# Patient Record
Sex: Male | Born: 1945 | Race: White | Hispanic: No | Marital: Married | Smoking: Current every day smoker
Health system: Southern US, Community
[De-identification: ages and names within clinical notes are randomized; demographics above are authoritative.]

## PROBLEM LIST (undated history)

## (undated) DIAGNOSIS — N182 Chronic kidney disease, stage 2 (mild): Secondary | ICD-10-CM

## (undated) DIAGNOSIS — C3490 Malignant neoplasm of unspecified part of unspecified bronchus or lung: Secondary | ICD-10-CM

## (undated) DIAGNOSIS — J45909 Unspecified asthma, uncomplicated: Secondary | ICD-10-CM

## (undated) DIAGNOSIS — I714 Abdominal aortic aneurysm, without rupture, unspecified: Secondary | ICD-10-CM

## (undated) DIAGNOSIS — I2510 Atherosclerotic heart disease of native coronary artery without angina pectoris: Secondary | ICD-10-CM

## (undated) DIAGNOSIS — D696 Thrombocytopenia, unspecified: Secondary | ICD-10-CM

## (undated) DIAGNOSIS — I5032 Chronic diastolic (congestive) heart failure: Secondary | ICD-10-CM

## (undated) DIAGNOSIS — D649 Anemia, unspecified: Secondary | ICD-10-CM

## (undated) DIAGNOSIS — I509 Heart failure, unspecified: Secondary | ICD-10-CM

## (undated) DIAGNOSIS — B159 Hepatitis A without hepatic coma: Secondary | ICD-10-CM

## (undated) DIAGNOSIS — I480 Paroxysmal atrial fibrillation: Secondary | ICD-10-CM

## (undated) DIAGNOSIS — M069 Rheumatoid arthritis, unspecified: Secondary | ICD-10-CM

## (undated) DIAGNOSIS — J9610 Chronic respiratory failure, unspecified whether with hypoxia or hypercapnia: Secondary | ICD-10-CM

## (undated) DIAGNOSIS — I2781 Cor pulmonale (chronic): Secondary | ICD-10-CM

## (undated) DIAGNOSIS — G4733 Obstructive sleep apnea (adult) (pediatric): Secondary | ICD-10-CM

## (undated) DIAGNOSIS — I4821 Permanent atrial fibrillation: Secondary | ICD-10-CM

## (undated) DIAGNOSIS — I10 Essential (primary) hypertension: Secondary | ICD-10-CM

## (undated) DIAGNOSIS — J449 Chronic obstructive pulmonary disease, unspecified: Secondary | ICD-10-CM

## (undated) DIAGNOSIS — I272 Pulmonary hypertension, unspecified: Secondary | ICD-10-CM

## (undated) HISTORY — PX: BACK SURGERY: SHX140

## (undated) HISTORY — PX: JOINT REPLACEMENT: SHX530

## (undated) HISTORY — PX: FRACTURE SURGERY: SHX138

---

## 2014-09-29 ENCOUNTER — Emergency Department (HOSPITAL_BASED_OUTPATIENT_CLINIC_OR_DEPARTMENT_OTHER)
Admission: EM | Admit: 2014-09-29 | Discharge: 2014-09-29 | Disposition: A | Discharge status: Home / Self Care | Payer: Medicare Other | Attending: Emergency Medicine | Admitting: Emergency Medicine

## 2014-09-29 ENCOUNTER — Encounter (HOSPITAL_BASED_OUTPATIENT_CLINIC_OR_DEPARTMENT_OTHER): Payer: Self-pay

## 2014-09-29 ENCOUNTER — Emergency Department (HOSPITAL_BASED_OUTPATIENT_CLINIC_OR_DEPARTMENT_OTHER): Payer: Medicare Other

## 2014-09-29 DIAGNOSIS — M179 Osteoarthritis of knee, unspecified: Secondary | ICD-10-CM | POA: Diagnosis not present

## 2014-09-29 DIAGNOSIS — Z8619 Personal history of other infectious and parasitic diseases: Secondary | ICD-10-CM | POA: Insufficient documentation

## 2014-09-29 DIAGNOSIS — I10 Essential (primary) hypertension: Secondary | ICD-10-CM | POA: Insufficient documentation

## 2014-09-29 DIAGNOSIS — Z79899 Other long term (current) drug therapy: Secondary | ICD-10-CM | POA: Insufficient documentation

## 2014-09-29 DIAGNOSIS — R52 Pain, unspecified: Secondary | ICD-10-CM

## 2014-09-29 DIAGNOSIS — M25562 Pain in left knee: Secondary | ICD-10-CM | POA: Diagnosis present

## 2014-09-29 DIAGNOSIS — M25462 Effusion, left knee: Secondary | ICD-10-CM | POA: Diagnosis not present

## 2014-09-29 DIAGNOSIS — M1710 Unilateral primary osteoarthritis, unspecified knee: Secondary | ICD-10-CM

## 2014-09-29 DIAGNOSIS — J449 Chronic obstructive pulmonary disease, unspecified: Secondary | ICD-10-CM | POA: Insufficient documentation

## 2014-09-29 DIAGNOSIS — Z7982 Long term (current) use of aspirin: Secondary | ICD-10-CM | POA: Diagnosis not present

## 2014-09-29 HISTORY — DX: Essential (primary) hypertension: I10

## 2014-09-29 HISTORY — DX: Hepatitis a without hepatic coma: B15.9

## 2014-09-29 HISTORY — DX: Chronic obstructive pulmonary disease, unspecified: J44.9

## 2014-09-29 MED ORDER — HYDROCODONE-ACETAMINOPHEN 5-325 MG PO TABS: 1.0000 | ORAL_TABLET | Freq: Four times a day (QID) | ORAL | Status: DC | PRN

## 2014-09-29 MED ORDER — HYDROCODONE-ACETAMINOPHEN 5-325 MG PO TABS
1.0000 | ORAL_TABLET | Freq: Once | ORAL | Status: AC
  Administered 2014-09-29: 1 via ORAL
  Filled 2014-09-29: qty 1

## 2014-09-29 NOTE — Discharge Instructions (Signed)
Take motrin for pain.  Take vicodin for severe pain. Do NOT drive with it  Use ace wrap for comfort.   Elevated your left leg.   Use crutches for comfort.  See orthopedic doctor.  Return to ER if you have severe pain, unable to walk, fever, worse swelling.

## 2014-09-29 NOTE — ED Provider Notes (Signed)
CSN: [id number]     Arrival date & time 09/29/14  1816 History  This chart was scribed for Wandra Arthurs, MD by Stephania Fragmin, ED Scribe. This patient was seen in room MHT13/MHT13 and the patient's care was started at 7:14 PM.    Chief Complaint  Patient presents with  . Knee Pain   The history is provided by the patient. No language interpreter was used.    HPI Comments: Scott Vanderveer is a 69 y.o. male who presents to the Emergency Department complaining of constant, moderate left knee pain that began when he woke up this morning. He denies any known injury but went bowling yesterday. He also complains of associated left knee swelling. Tried a sleeve on his left knee that helped. Patient takes a daily baby aspirin. He denies fever.   Past Medical History  Diagnosis Date  . Hepatitis A   . COPD (chronic obstructive pulmonary disease)   . Hypertension    Past Surgical History  Procedure Laterality Date  . Back surgery    . Joint replacement    . Fracture surgery     No family history on file. History  Substance Use Topics  . Smoking status: Never Smoker   . Smokeless tobacco: Not on file  . Alcohol Use: No    Review of Systems  Constitutional: Negative for fever.  Musculoskeletal: Positive for joint swelling and arthralgias.  All other systems reviewed and are negative.  Allergies  Darvon  Home Medications   Prior to Admission medications   Medication Sig Start Date End Date Taking? Authorizing Provider  aspirin 81 MG tablet Take 81 mg by mouth daily.   Yes Historical Provider, MD  gabapentin (NEURONTIN) 300 MG capsule Take 300 mg by mouth 3 (three) times daily.   Yes Historical Provider, MD  hydrochlorothiazide (HYDRODIURIL) 25 MG tablet Take 25 mg by mouth daily.   Yes Historical Provider, MD  lisinopril (PRINIVIL,ZESTRIL) 10 MG tablet Take 10 mg by mouth daily.   Yes Historical Provider, MD   BP 149/62 mmHg  Pulse 55  Temp(Src) 98.2 F (36.8 C) (Oral)  Resp 18  Ht 6'  1" (1.854 m)  Wt 235 lb (106.595 kg)  BMI 31.01 kg/m2  SpO2 96% Physical Exam  Constitutional: He is oriented to person, place, and time. He appears well-developed and well-nourished. No distress.  HENT:  Head: Normocephalic and atraumatic.  Eyes: Conjunctivae and EOM are normal.  Neck: Neck supple. No tracheal deviation present.  Cardiovascular: Normal rate.   Pulmonary/Chest: Effort normal. No respiratory distress.  Musculoskeletal: Normal range of motion.  Moderate left knee effusion. Normal ROM of the left knee. 2+ pedal pulses. ACL intact.   Neurological: He is alert and oriented to person, place, and time.  Skin: Skin is warm and dry.  Psychiatric: He has a normal mood and affect. His behavior is normal.  Nursing note and vitals reviewed.   ED Course  Procedures (including critical care time)  DIAGNOSTIC STUDIES: Oxygen Saturation is 96% on RA, normal by my interpretation.    COORDINATION OF CARE: 7:17 PM - XR results reviewed with pt. Discussed treatment plan with pt at bedside which includes Rx Vicodin, wrapping knee in a compression sleeve, and elevating the leg. Pt declines crutches, as he states he has his wife's crutches at home, which he can adjust. Will also refer to orthopedist for injections. Pt verbalized understanding and agreed to plan.   Imaging Review Dg Knee Ap/lat W/sunrise Left  09/29/2014  CLINICAL DATA:  Knee pain. LEFT knee pain for 1 day. Denies injury. Distal patellar pain.  EXAM: LEFT KNEE 3 VIEWS  COMPARISON:  None.  FINDINGS: No acute osseous abnormality. Mild varus deformity of the knee is present associated with medial compartment joint space collapse. Chondrocalcinosis of the lateral meniscus. 24 mm calcification is present dorsal to the tibial plateau, likely representing a loose body in the popliteal sheath. Atherosclerosis. Moderate patellofemoral osteoarthritis.  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    Electronically Signed   By: Dereck Ligas M.D.   On: 09/29/2014 18:54   MDM   Final diagnoses:  Pain   Isaak Delmundo is a 69 y.o. male here with L knee pain with effusion. Was bowling yesterday but no injury. Xray showed arthritis. Likely reactive effusion from overuse. No signs of septic knee. Knee is not red or hot to suggest gout. Likely from arthritis. Ace wrap applied. Has crutches at home. Recommend leg elevation, crutches for comfort, motrin, prn vicodin, ortho f/u.    I personally performed the services described in this documentation, which was scribed in my presence. The recorded information has been reviewed and is accurate.      Wandra Arthurs, MD 09/29/14 [phone]

## 2014-09-29 NOTE — ED Notes (Signed)
Left knee pain since last night. Denies injury.

## 2015-06-03 ENCOUNTER — Ambulatory Visit (INDEPENDENT_AMBULATORY_CARE_PROVIDER_SITE_OTHER): Payer: Medicare Other | Admitting: Podiatry

## 2015-06-03 ENCOUNTER — Encounter: Payer: Self-pay | Admitting: Podiatry

## 2015-06-03 ENCOUNTER — Other Ambulatory Visit: Payer: Self-pay

## 2015-06-03 VITALS — BP 168/76 | HR 53 | Ht 72.0 in | Wt 238.0 lb

## 2015-06-03 DIAGNOSIS — M21969 Unspecified acquired deformity of unspecified lower leg: Secondary | ICD-10-CM

## 2015-06-03 DIAGNOSIS — M216X9 Other acquired deformities of unspecified foot: Secondary | ICD-10-CM

## 2015-06-03 DIAGNOSIS — M216X1 Other acquired deformities of right foot: Secondary | ICD-10-CM

## 2015-06-03 DIAGNOSIS — M722 Plantar fascial fibromatosis: Secondary | ICD-10-CM | POA: Diagnosis not present

## 2015-06-03 DIAGNOSIS — M216X2 Other acquired deformities of left foot: Secondary | ICD-10-CM

## 2015-06-03 NOTE — Patient Instructions (Signed)
Seen for left heel pain. Noted of tight Achilles tendon and weakened first metatarsal bone with heel spur. Injection given. Reviewed tendon stretch exercise. Do daily stretch. Return in 2 weeks.

## 2015-06-03 NOTE — Progress Notes (Signed)
SUBJECTIVE: 70 y.o. year old male presents complaining of left heel pain especially in the morning, feels like a stone bruise. Pain goes up to calf at times. Pain started about 1 year ago and getting worse. Pain level is 10 out of 10 scale.   REVIEW OF SYSTEMS: Constitutional: negative Eyes: negative Ears, nose, mouth, throat, and face: negative Respiratory: negative Cardiovascular: history of a mild heart attack 5-6 years ago. Gastrointestinal: negative Genitourinary:negative Hematologic/lymphatic: negative Musculoskeletal:negative Neurological: negative Endocrine: negative Allergic/Immunologic: negative  OBJECTIVE: DERMATOLOGIC EXAMINATION: Nails: Thick yellow hypertrophic nails x 10.  No abnormal skin lesions noted.  VASCULAR EXAMINATION OF LOWER LIMBS: Pedal pulses: All pedal pulses are palpable with normal pulsation. Capillary Filling times within 3 seconds in all digits.  No edema or erythema, no ischemic changes noted. Temperature gradient from tibial crest to dorsum of foot is within normal bilateral.  NEUROLOGIC EXAMINATION OF THE LOWER LIMBS: Achilles DTR is present and within normal. Monofilament (Semmes-Weinstein 10-gm) sensory testing positive 3 out of 6, bilateral. Vibratory sensations('128Hz'$  turning fork) intact at medial and lateral forefoot bilateral.  Sharp and Dull discriminatory sensations at the plantar ball of hallux is intact bilateral.   MUSCULOSKELETAL EXAMINATION: Positive for high arched cavus type foot bilateral. Limited STJ motion bilateral. Tight Achilles tendon bilateral.   RADIOGRAPHIC STUDIES:  General overview: Negative of Osteopenia Absence of soft tissue swelling. Absence of acute change in osseous or articular surfaces of forefoot or rearfoot.  AP View:  Rectus foot without gross deformities. Lateral view:  High arched cavus foot bilateral. Elevated first metatarsal left. Positive of plantar calcaneal spur left.   ASSESSMENT: 1.  Plantar fasciitis left with heel spur. 2. Ankle equinus bilateral. 3. 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  PLAN: Reviewed clinical findings and available treatment options. Left heel injected with mixture of 4 mg Dexamethasone, 4 mg Triamcinolone, and 1 cc of 0.5% Marcaine plain. Patient tolerated well without difficulty.  Night Splint dispensed with instruction. Stretch exercise reviewed. Return in 2 weeks.

## 2015-06-17 ENCOUNTER — Encounter: Payer: Self-pay | Admitting: Podiatry

## 2015-06-17 ENCOUNTER — Ambulatory Visit (INDEPENDENT_AMBULATORY_CARE_PROVIDER_SITE_OTHER): Payer: Medicare Other | Admitting: Podiatry

## 2015-06-17 ENCOUNTER — Ambulatory Visit: Payer: Medicare Other | Admitting: Podiatry

## 2015-06-17 VITALS — BP 159/71 | HR 50

## 2015-06-17 DIAGNOSIS — B351 Tinea unguium: Secondary | ICD-10-CM

## 2015-06-17 DIAGNOSIS — M21969 Unspecified acquired deformity of unspecified lower leg: Secondary | ICD-10-CM | POA: Diagnosis not present

## 2015-06-17 DIAGNOSIS — M722 Plantar fascial fibromatosis: Secondary | ICD-10-CM | POA: Diagnosis not present

## 2015-06-17 DIAGNOSIS — M79605 Pain in left leg: Secondary | ICD-10-CM | POA: Diagnosis not present

## 2015-06-17 NOTE — Patient Instructions (Signed)
Improved heel pain. All nails debrided. Return in 3 months or sooner if needed.

## 2015-06-17 NOTE — Progress Notes (Signed)
SUBJECTIVE: 70 y.o. year old male presents stating that the pain has subsided, only occasional pain now since he was treated with Cortisone injection and Night Splint.   2 weeks ago he had left heel pain especially in the morning, feels like a stone bruise. Pain goes up to calf at times. Pain started about 1 year ago and getting worse. Pain level is 10 out of 10 scale.   OBJECTIVE: DERMATOLOGIC EXAMINATION: Nails: Thick yellow hypertrophic nails x 10.  No abnormal skin lesions noted.  VASCULAR EXAMINATION OF LOWER LIMBS: Pedal pulses: All pedal pulses are palpable with normal pulsation. Capillary Filling times within 3 seconds in all digits.  No edema or erythema, no ischemic changes noted. Temperature gradient from tibial crest to dorsum of foot is within normal bilateral.  NEUROLOGIC EXAMINATION OF THE LOWER LIMBS: All epicritic and tactile sensations grossly intact.   MUSCULOSKELETAL EXAMINATION: Positive for high arched cavus type foot bilateral. Limited STJ motion bilateral. Tight Achilles tendon bilateral.   ASSESSMENT: 1. Plantar fasciitis left with heel spur improved with injection and night splint. 2. Ankle equinus bilateral. 3. 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 4. Onychomycosis x 10.  PLAN: Reviewed clinical findings and available treatment options. All nails debrided. Continue stretch exercise. Return in 3 months or as needed.

## 2015-09-16 ENCOUNTER — Ambulatory Visit: Payer: Medicare Other | Admitting: Podiatry

## 2015-10-14 ENCOUNTER — Ambulatory Visit (INDEPENDENT_AMBULATORY_CARE_PROVIDER_SITE_OTHER): Payer: Medicare Other | Admitting: Podiatry

## 2015-10-14 ENCOUNTER — Encounter: Payer: Self-pay | Admitting: Podiatry

## 2015-10-14 VITALS — BP 130/105 | HR 60

## 2015-10-14 DIAGNOSIS — B351 Tinea unguium: Secondary | ICD-10-CM

## 2015-10-14 DIAGNOSIS — M79605 Pain in left leg: Secondary | ICD-10-CM

## 2015-10-14 DIAGNOSIS — M79673 Pain in unspecified foot: Secondary | ICD-10-CM | POA: Diagnosis not present

## 2015-10-14 NOTE — Patient Instructions (Signed)
Seen for hypertrophic nails. All nails debrided. Return in 3 months or as needed.  

## 2015-10-14 NOTE — Progress Notes (Signed)
SUBJECTIVE: 69 y.o. year old male presents complaining of painful toe nails.  Positive history of heel pain that was resolved with cortisone injection and Night Splint with satisfactory relief.  OBJECTIVE: DERMATOLOGIC EXAMINATION: Nails: Thick yellow hypertrophic nails x 10.  No abnormal skin lesions noted.  VASCULAR EXAMINATION OF LOWER LIMBS: Pedal pulses: All pedal pulses are palpable with normal pulsation. Capillary Filling times within 3 seconds in all digits.  No edema or erythema, no ischemic changes noted. Temperature gradient from tibial crest to dorsum of foot is within normal bilateral.  NEUROLOGIC EXAMINATION OF THE LOWER LIMBS: All epicritic and tactile sensations grossly intact.   MUSCULOSKELETAL EXAMINATION: Positive for high arched cavus type foot bilateral. Limited STJ motion bilateral. Tight Achilles tendon bilateral.   ASSESSMENT: 1. Hx of Plantar fasciitis left with heel spur improved with injection and night splint. 2. Ankle equinus bilateral. 3. 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 4. Onychomycosis x 10.  PLAN: Reviewed clinical findings and available treatment options. All nails debrided. Continue stretch exercise. Return in 3 months or as needed.

## 2016-01-12 ENCOUNTER — Ambulatory Visit: Payer: Medicare Other | Admitting: Podiatry

## 2016-02-22 ENCOUNTER — Encounter: Payer: Self-pay | Admitting: Podiatry

## 2016-02-22 ENCOUNTER — Ambulatory Visit (INDEPENDENT_AMBULATORY_CARE_PROVIDER_SITE_OTHER): Payer: Medicare Other | Admitting: Podiatry

## 2016-02-22 VITALS — BP 138/61 | HR 55

## 2016-02-22 DIAGNOSIS — M79672 Pain in left foot: Secondary | ICD-10-CM | POA: Diagnosis not present

## 2016-02-22 DIAGNOSIS — M79671 Pain in right foot: Secondary | ICD-10-CM

## 2016-02-22 DIAGNOSIS — B351 Tinea unguium: Secondary | ICD-10-CM

## 2016-02-22 NOTE — Patient Instructions (Signed)
Seen for hypertrophic nails. All nails debrided. Return in 3 months or as needed.  

## 2016-02-22 NOTE — Progress Notes (Signed)
SUBJECTIVE: 71 y.o. year old male presents complaining of painful toe nails.  No new complaints other than painful toes from thick nails.   Hx of Plantar fasciitis left with heel spur improved with injection and night splint.  OBJECTIVE: DERMATOLOGIC EXAMINATION: Symptomatic thick yellow hypertrophic nails x 10.  VASCULAR EXAMINATION OF LOWER LIMBS: All pedal pulses are palpable with normal pulsation. Capillary Filling times within 3 seconds in all digits.  No edema or erythema, no ischemic changes noted. Temperature gradient from tibial crest to dorsum of foot is within normal bilateral. NEUROLOGIC EXAMINATION OF THE LOWER LIMBS: All epicritic and tactile sensations grossly intact.  MUSCULOSKELETAL EXAMINATION: Positive for high arched cavus type foot bilateral. Limited STJ motion bilateral. Tight Achilles tendon bilateral.   ASSESSMENT: 1. Onychomycosis x 10. 2. Pain in both feet, great toes.  PLAN: Reviewed clinical findings and available treatment options. All nails debrided. Continue stretch exercise. Return in 3 months or as needed.

## 2016-05-23 ENCOUNTER — Ambulatory Visit: Payer: Medicare Other | Admitting: Podiatry

## 2017-03-10 ENCOUNTER — Other Ambulatory Visit: Payer: Self-pay

## 2017-03-10 ENCOUNTER — Encounter (HOSPITAL_BASED_OUTPATIENT_CLINIC_OR_DEPARTMENT_OTHER): Payer: Self-pay | Admitting: *Deleted

## 2017-03-10 ENCOUNTER — Emergency Department (HOSPITAL_BASED_OUTPATIENT_CLINIC_OR_DEPARTMENT_OTHER)
Admission: EM | Admit: 2017-03-10 | Discharge: 2017-03-10 | Disposition: A | Discharge status: Home / Self Care | Payer: Medicare Other | Attending: Emergency Medicine | Admitting: Emergency Medicine

## 2017-03-10 ENCOUNTER — Emergency Department (HOSPITAL_BASED_OUTPATIENT_CLINIC_OR_DEPARTMENT_OTHER): Payer: Medicare Other

## 2017-03-10 DIAGNOSIS — F17200 Nicotine dependence, unspecified, uncomplicated: Secondary | ICD-10-CM | POA: Insufficient documentation

## 2017-03-10 DIAGNOSIS — I10 Essential (primary) hypertension: Secondary | ICD-10-CM | POA: Diagnosis not present

## 2017-03-10 DIAGNOSIS — Z7982 Long term (current) use of aspirin: Secondary | ICD-10-CM | POA: Diagnosis not present

## 2017-03-10 DIAGNOSIS — J189 Pneumonia, unspecified organism: Secondary | ICD-10-CM | POA: Diagnosis not present

## 2017-03-10 DIAGNOSIS — Z79899 Other long term (current) drug therapy: Secondary | ICD-10-CM | POA: Insufficient documentation

## 2017-03-10 DIAGNOSIS — J441 Chronic obstructive pulmonary disease with (acute) exacerbation: Secondary | ICD-10-CM | POA: Diagnosis not present

## 2017-03-10 DIAGNOSIS — R05 Cough: Secondary | ICD-10-CM | POA: Diagnosis present

## 2017-03-10 MED ORDER — PREDNISONE 50 MG PO TABS
60.0000 mg | ORAL_TABLET | Freq: Once | ORAL | Status: AC
  Administered 2017-03-10: 60 mg via ORAL
  Filled 2017-03-10: qty 1

## 2017-03-10 MED ORDER — IPRATROPIUM BROMIDE 0.02 % IN SOLN
0.5000 mg | Freq: Once | RESPIRATORY_TRACT | Status: AC
  Administered 2017-03-10: 0.5 mg via RESPIRATORY_TRACT
  Filled 2017-03-10: qty 2.5

## 2017-03-10 MED ORDER — HYDROCOD POLST-CPM POLST ER 10-8 MG/5ML PO SUER: 5.0000 mL | Freq: Once | ORAL | Status: DC

## 2017-03-10 MED ORDER — ALBUTEROL SULFATE HFA 108 (90 BASE) MCG/ACT IN AERS
2.0000 | INHALATION_SPRAY | RESPIRATORY_TRACT | Status: DC | PRN
  Administered 2017-03-10: 2 via RESPIRATORY_TRACT
  Filled 2017-03-10: qty 6.7

## 2017-03-10 MED ORDER — LEVOFLOXACIN 500 MG PO TABS: 500.0000 mg | ORAL_TABLET | Freq: Every day | ORAL | 0 refills | Status: DC

## 2017-03-10 MED ORDER — PREDNISONE 20 MG PO TABS: 40.0000 mg | ORAL_TABLET | Freq: Every day | ORAL | 0 refills | Status: DC

## 2017-03-10 MED ORDER — LEVOFLOXACIN 500 MG PO TABS
500.0000 mg | ORAL_TABLET | Freq: Once | ORAL | Status: AC
  Administered 2017-03-10: 500 mg via ORAL
  Filled 2017-03-10: qty 1

## 2017-03-10 MED ORDER — HYDROCOD POLST-CPM POLST ER 10-8 MG/5ML PO SUER
5.0000 mL | Freq: Once | ORAL | Status: AC
  Administered 2017-03-10: 5 mL via ORAL
  Filled 2017-03-10: qty 5

## 2017-03-10 MED ORDER — ALBUTEROL SULFATE (2.5 MG/3ML) 0.083% IN NEBU
5.0000 mg | INHALATION_SOLUTION | Freq: Once | RESPIRATORY_TRACT | Status: AC
  Administered 2017-03-10: 5 mg via RESPIRATORY_TRACT
  Filled 2017-03-10: qty 6

## 2017-03-10 MED ORDER — HYDROCOD POLST-CPM POLST ER 10-8 MG/5ML PO SUER: 5.0000 mL | Freq: Two times a day (BID) | ORAL | 0 refills | Status: DC | PRN

## 2017-03-10 NOTE — ED Notes (Signed)
Pt on cardiac monitor, continuous pulse ox and auto VS

## 2017-03-10 NOTE — ED Triage Notes (Signed)
Pt c/o feeling dizzy after coughing. Also pain in bilateral ribs. Denies chest pain

## 2017-03-10 NOTE — ED Notes (Signed)
Pt ambulated in hall with Respiratory. States he feels better.

## 2017-03-10 NOTE — ED Provider Notes (Signed)
[facility] [place] EMERGENCY DEPARTMENT Provider Note   CSN: [id number] Arrival date & time: 03/10/17  1508     History   Chief Complaint Chief Complaint  Patient presents with  . Cough  . Fever    HPI Jake [name] is a 72 y.o. male.  The history is provided by the patient.  Cough  This is a new problem. Episode onset: 4 days ago. The problem occurs constantly. The problem has been gradually worsening. The cough is non-productive. Maximum temperature: Felt feverish but did not take his temperature. The fever has been present for 1 to 2 days. Associated symptoms include chest pain, chills, sweats, rhinorrhea, shortness of breath and wheezing. Associated symptoms comments: He coughs he gets a deep pain in the right side of his chest it is not there constantly.  He denies any nausea or vomiting.  He did receive a flu shot this year and has not been exposed to anybody with the flu.  He has not been hospitalized recently. Treatments tried: Tried his inhaler at home with only minimal benefit. The treatment provided no relief. He is a smoker. His past medical history is significant for COPD. Past medical history comments: Has received a pneumonia shot.  Fever   Associated symptoms include chest pain and cough.    Past Medical History:  Diagnosis Date  . COPD (chronic obstructive pulmonary disease) ([place])   . Hepatitis A   . Hypertension     Patient Active Problem List   Diagnosis Date Noted  . Plantar fasciitis of left foot 06/03/2015  . 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 Metatarsal deformity 06/03/2015    Past Surgical History:  Procedure Laterality Date  . BACK SURGERY    . FRACTURE SURGERY    . JOINT REPLACEMENT         Home Medications    Prior to Admission medications   Medication Sig Start Date End Date Taking? Authorizing Provider  gabapentin (NEURONTIN) 300 MG capsule Take 300 mg by mouth 3 (three) times daily. Reported on 06/03/2015   Yes  [provider]  hydrochlorothiazide (HYDRODIURIL) 25 MG tablet Take 25 mg by mouth daily. Reported on 06/03/2015   Yes [provider]  lisinopril (PRINIVIL,ZESTRIL) 10 MG tablet Take 10 mg by mouth daily. Reported on 06/03/2015   Yes [provider]  aspirin 81 MG tablet Take 81 mg by mouth daily.    [provider]  HYDROcodone-acetaminophen (NORCO/VICODIN) 5-325 MG per tablet Take 1 tablet by mouth every 6 (six) hours as needed. 09/29/14   Drenda Freeze, MD  sildenafil (REVATIO) 20 MG tablet Reported on 06/03/2015 04/30/15   [provider]    Family History No family history on file.  Social History Social History   Tobacco Use  . Smoking status: Current Every Day Smoker  . Smokeless tobacco: Never Used  Substance Use Topics  . Alcohol use: Yes    Alcohol/week: 0.0 oz    Comment: 3 beers weekly  . Drug use: No     Allergies   Darvon [propoxyphene]   Review of Systems Review of Systems  Constitutional: Positive for chills and fever.  HENT: Positive for rhinorrhea.   Respiratory: Positive for cough, shortness of breath and wheezing.   Cardiovascular: Positive for chest pain.  All other systems reviewed and are negative.    Physical Exam Updated Vital Signs BP 106/64 (BP Location: Left Arm)   Pulse 69   Temp 98.7 F (37.1 C) (Oral)  Resp (!) 22   Ht 6\' 1"  (1.854 m)   Wt 99.3 kg (219 lb)   SpO2 95%   BMI 28.89 kg/m   Physical Exam  Constitutional: He is oriented to person, place, and time. He appears well-developed and well-nourished. No distress.  HENT:  Head: Normocephalic and atraumatic.  Nose: Nose normal.  Mouth/Throat: Oropharynx is clear and moist.  Eyes: Conjunctivae and EOM are normal. Pupils are equal, round, and reactive to light.  Neck: Normal range of motion. Neck supple.  Cardiovascular: Normal rate, regular rhythm and intact distal pulses.  No murmur heard. Pulmonary/Chest: Effort normal. No  respiratory distress. He has wheezes. He has no rales. He exhibits no tenderness.  Patient is in no acute distress and able to speak in complete sentences.  Mild tachypnea and diffuse wheezing.  Abdominal: Soft. He exhibits no distension. There is no tenderness. There is no rebound and no guarding.  Musculoskeletal: Normal range of motion. He exhibits no edema or tenderness.  Neurological: He is alert and oriented to person, place, and time.  Skin: Skin is warm and dry. No rash noted. No erythema.  Psychiatric: He has a normal mood and affect. His behavior is normal.  Nursing note and vitals reviewed.    ED Treatments / Results  Labs (all labs ordered are listed, but only abnormal results are displayed) Labs Reviewed - No data to display  EKG  EKG Interpretation None       Radiology Dg Chest 2 View  Result Date: 03/10/2017 CLINICAL DATA:  72 year old male with history of shortness of breath, fever and cough for the past 3 days. EXAM: CHEST  2 VIEW COMPARISON:  Chest x-ray 04/21/2013. FINDINGS: Lung volumes are normal. On the lateral projection there appears to be a new focus of airspace consolidation in one of the lower lobes posteriorly, however, this cannot be localized on the frontal projection. No other consolidative airspace disease. No pleural effusions. No pneumothorax. No pulmonary nodule or mass noted. Pulmonary vasculature and the cardiomediastinal silhouette are within normal limits. Atherosclerosis in the thoracic aorta. IMPRESSION: 1. Findings are concerning for early lower lobe pneumonia (although the side is indeterminate on the frontal projection), as discussed above. Followup PA and lateral chest X-ray is recommended in 3-4 weeks following trial of antibiotic therapy to ensure resolution and exclude underlying malignancy. 2. Aortic atherosclerosis. Electronically Signed   By: Vinnie Langton M.D.   On: 03/10/2017 16:07    Procedures Procedures (including critical care  time)  Medications Ordered in ED Medications  levofloxacin (LEVAQUIN) tablet 500 mg (not administered)  predniSONE (DELTASONE) tablet 60 mg (not administered)  albuterol (PROVENTIL) (2.5 MG/3ML) 0.083% nebulizer solution 5 mg (not administered)  ipratropium (ATROVENT) nebulizer solution 0.5 mg (not administered)     Initial Impression / Assessment and Plan / ED Course  I have reviewed the triage vital signs and the nursing notes.  Pertinent labs & imaging results that were available during my care of the patient were reviewed by me and considered in my medical decision making (see chart for details).    Elderly gentleman who continues to smoke and has a history of COPD presents with 4 days of URI symptoms, fever, cough, shortness of breath and wheezing.  Patient had tried using his rescue inhaler at home which he does not even know how old it is with only minimal improvement.  Last used around noon.  Patient did receive a flu shot and has had a pneumonia shot in the past.  Patient is in no acute distress on exam he is able to speak in complete sentences but does have diffuse wheezing.  Concern for flu versus pneumonia versus COPD exacerbation.  Patient's x-ray showed possible early lower lobe pneumonia although only seen on one view and recommended follow-up x-ray after antibiotic therapy.  Patient is afebrile here and has normal vital signs.  Oxygen saturation on room air is between 90-95%.  Will give albuterol, Atrovent, prednisone and Levaquin.  Will reevaluate after nebs.  Given evidence of possible infection we will not treat with Tamiflu at this time.  6:57 PM After second albuterol and Atrovent treatment patient is feeling better.  He was able to ambulate here in the department with oxygen remaining greater than 90%.  Heart rate did not get over 102.  Patient is feeling better.  Will discharge home with Levaquin, prednisone and cough suppressant.   Final Clinical Impressions(s) / ED  Diagnoses   Final diagnoses:  Community acquired pneumonia, unspecified laterality  COPD exacerbation [facility])    ED Discharge Orders        Ordered    levofloxacin (LEVAQUIN) 500 MG tablet  Daily     03/10/17 1858    predniSONE (DELTASONE) 20 MG tablet  Daily     03/10/17 1858       Blanchie Dessert, MD 03/10/17 1858

## 2017-03-10 NOTE — ED Triage Notes (Signed)
Pt reports feeling poorly since Wednesday. Non-productive cough, SOB with exertion, and fever since yesterday. Pt reports temp ~102 at home

## 2017-03-10 NOTE — ED Notes (Signed)
Patient transported to X-ray 

## 2017-03-10 NOTE — ED Notes (Signed)
ED Provider at bedside. 

## 2017-03-10 NOTE — ED Notes (Signed)
Pt states he got the flu shot this year, but last pm started feeling bad. Non prod cough which is causing him to have rib pain. Fever last p.m. BBS-exp wheezing on left and diminished on right. No distress.

## 2017-03-10 NOTE — ED Notes (Signed)
Patient ambulated on room air, HR 93-103, SpO2 91-93%, + DOE, VS per monitor when back in room.  BBS decreased

## 2017-03-10 NOTE — ED Notes (Signed)
SpO2 dropped 88% on room air with HHN treatment.  No distress noted, some ECG ectopy, changed HHN to O2 for remaining treatment.

## 2017-03-10 NOTE — ED Notes (Signed)
Pt and family given d/c instructions as per chart. Rx x 3. Verbalizes understanding. No questions.

## 2019-01-15 ENCOUNTER — Encounter (HOSPITAL_BASED_OUTPATIENT_CLINIC_OR_DEPARTMENT_OTHER): Payer: Self-pay | Admitting: *Deleted

## 2019-01-15 ENCOUNTER — Other Ambulatory Visit: Payer: Self-pay

## 2019-01-15 ENCOUNTER — Emergency Department (HOSPITAL_BASED_OUTPATIENT_CLINIC_OR_DEPARTMENT_OTHER): Payer: Medicare Other

## 2019-01-15 ENCOUNTER — Emergency Department (HOSPITAL_BASED_OUTPATIENT_CLINIC_OR_DEPARTMENT_OTHER)
Admission: EM | Admit: 2019-01-15 | Discharge: 2019-01-15 | Disposition: A | Discharge status: Home / Self Care | Payer: Medicare Other | Attending: Emergency Medicine | Admitting: Emergency Medicine

## 2019-01-15 DIAGNOSIS — M25511 Pain in right shoulder: Secondary | ICD-10-CM | POA: Diagnosis not present

## 2019-01-15 DIAGNOSIS — F17210 Nicotine dependence, cigarettes, uncomplicated: Secondary | ICD-10-CM | POA: Diagnosis not present

## 2019-01-15 DIAGNOSIS — Z7982 Long term (current) use of aspirin: Secondary | ICD-10-CM | POA: Diagnosis not present

## 2019-01-15 DIAGNOSIS — J449 Chronic obstructive pulmonary disease, unspecified: Secondary | ICD-10-CM | POA: Diagnosis not present

## 2019-01-15 DIAGNOSIS — I10 Essential (primary) hypertension: Secondary | ICD-10-CM | POA: Insufficient documentation

## 2019-01-15 DIAGNOSIS — L03211 Cellulitis of face: Secondary | ICD-10-CM | POA: Insufficient documentation

## 2019-01-15 DIAGNOSIS — R6884 Jaw pain: Secondary | ICD-10-CM | POA: Diagnosis present

## 2019-01-15 LAB — CBC WITH DIFFERENTIAL/PLATELET
Abs Immature Granulocytes: 0.02 10*3/uL (ref 0.00–0.07)
Basophils Absolute: 0 10*3/uL (ref 0.0–0.1)
Basophils Relative: 0 %
Eosinophils Absolute: 0 10*3/uL (ref 0.0–0.5)
Eosinophils Relative: 0 %
HCT: 35.4 % — ABNORMAL LOW (ref 39.0–52.0)
Hemoglobin: 11.5 g/dL — ABNORMAL LOW (ref 13.0–17.0)
Immature Granulocytes: 1 %
Lymphocytes Relative: 12 %
Lymphs Abs: 0.5 10*3/uL — ABNORMAL LOW (ref 0.7–4.0)
MCH: 34.2 pg — ABNORMAL HIGH (ref 26.0–34.0)
MCHC: 32.5 g/dL (ref 30.0–36.0)
MCV: 105.4 fL — ABNORMAL HIGH (ref 80.0–100.0)
Monocytes Absolute: 0.4 10*3/uL (ref 0.1–1.0)
Monocytes Relative: 10 %
Neutro Abs: 3.2 10*3/uL (ref 1.7–7.7)
Neutrophils Relative %: 77 %
Platelets: 146 10*3/uL — ABNORMAL LOW (ref 150–400)
RBC: 3.36 MIL/uL — ABNORMAL LOW (ref 4.22–5.81)
RDW: 13.2 % (ref 11.5–15.5)
WBC: 4.2 10*3/uL (ref 4.0–10.5)
nRBC: 0 % (ref 0.0–0.2)

## 2019-01-15 LAB — BASIC METABOLIC PANEL
Anion gap: 8 (ref 5–15)
BUN: 21 mg/dL (ref 8–23)
CO2: 28 mmol/L (ref 22–32)
Calcium: 8.7 mg/dL — ABNORMAL LOW (ref 8.9–10.3)
Chloride: 100 mmol/L (ref 98–111)
Creatinine, Ser: 1.1 mg/dL (ref 0.61–1.24)
GFR calc Af Amer: >60 mL/min (ref >60)
GFR calc non Af Amer: >60 mL/min (ref >60)
Glucose, Bld: 116 mg/dL — ABNORMAL HIGH (ref 70–99)
Potassium: 4.3 mmol/L (ref 3.5–5.1)
Sodium: 136 mmol/L (ref 135–145)

## 2019-01-15 MED ORDER — KETOROLAC TROMETHAMINE 15 MG/ML IJ SOLN
15.0000 mg | Freq: Once | INTRAMUSCULAR | Status: AC
  Administered 2019-01-15: 15 mg via INTRAVENOUS
  Filled 2019-01-15: qty 1

## 2019-01-15 MED ORDER — CEPHALEXIN 500 MG PO CAPS: 500.0000 mg | ORAL_CAPSULE | Freq: Three times a day (TID) | ORAL | 0 refills | Status: DC

## 2019-01-15 MED ORDER — HYDROMORPHONE HCL 1 MG/ML IJ SOLN
0.5000 mg | Freq: Once | INTRAMUSCULAR | Status: AC
  Administered 2019-01-15: 0.5 mg via INTRAVENOUS
  Filled 2019-01-15: qty 1

## 2019-01-15 MED ORDER — IOHEXOL 300 MG/ML  SOLN
100.0000 mL | Freq: Once | INTRAMUSCULAR | Status: AC | PRN
  Administered 2019-01-15: 100 mL via INTRAVENOUS

## 2019-01-15 MED ORDER — VANCOMYCIN HCL 10 G IV SOLR
2000.0000 mg | Freq: Once | INTRAVENOUS | Status: AC
  Administered 2019-01-15: 2000 mg via INTRAVENOUS
  Filled 2019-01-15: qty 2000

## 2019-01-15 MED ORDER — HYDROCODONE-ACETAMINOPHEN 5-325 MG PO TABS: 1.0000 | ORAL_TABLET | Freq: Four times a day (QID) | ORAL | 0 refills | Status: DC | PRN

## 2019-01-15 MED ORDER — VANCOMYCIN HCL 1000 MG IV SOLR
INTRAVENOUS | Status: AC
  Filled 2019-01-15: qty 2000

## 2019-01-15 NOTE — ED Triage Notes (Addendum)
Pt c/o right jaw pain/ shoulder pain  x 7 hrs redness noted to right side of face, increased pain with mouth movt, denies dental pain

## 2019-01-15 NOTE — ED Notes (Signed)
vanc completed

## 2019-01-19 NOTE — ED Provider Notes (Signed)
[facility] DEPART[facility] Provider Note   CSN: [id number] Arrival date & time: 01/15/19  1614     History   Chief Complaint Chief Complaint  Patient presents with  . Jaw Pain    HPI Jake [name] is a 73 y.o. male.     HPI   73 year old male with right facial/jaw pain.  Onset earlier today.  Progressive worsening.  Worse when he eats/opens his mouth.  No fevers or chills.  Family at bedside feels that his right face is mildly swollen and red.  No dental pain.  No difficulty swallowing.  Past Medical History:  Diagnosis Date  . COPD (chronic obstructive pulmonary disease) ([place])   . Hepatitis A   . Hypertension     Patient Active Problem List   Diagnosis Date Noted  . Plantar fasciitis of left foot 06/03/2015  . 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 Metatarsal deformity 06/03/2015    Past Surgical History:  Procedure Laterality Date  . BACK SURGERY    . FRACTURE SURGERY    . JOINT REPLACEMENT          Home Medications    Prior to Admission medications   Medication Sig Start Date End Date Taking? Authorizing Provider  aspirin 81 MG tablet Take 81 mg by mouth daily.    [provider]  cephALEXin (KEFLEX) 500 MG capsule Take 1 capsule (500 mg total) by mouth 3 (three) times daily. 01/15/19   Virgel Manifold, MD  chlorpheniramine-HYDROcodone [facility] PENNKINETIC ER) 10-8 MG/5ML SUER Take 5 mLs by mouth every 12 (twelve) hours as needed for cough. 03/10/17   Blanchie Dessert, MD  gabapentin (NEURONTIN) 300 MG capsule Take 300 mg by mouth 3 (three) times daily. Reported on 06/03/2015    [provider]  hydrochlorothiazide (HYDRODIURIL) 25 MG tablet Take 25 mg by mouth daily. Reported on 06/03/2015    [provider]  HYDROcodone-acetaminophen (NORCO/VICODIN) 5-325 MG tablet Take 1 tablet by mouth every 6 (six) hours as needed. 01/15/19   Virgel Manifold, MD  levofloxacin (LEVAQUIN) 500 MG tablet Take 1 tablet  (500 mg total) by mouth daily. 03/10/17   Blanchie Dessert, MD  lisinopril (PRINIVIL,ZESTRIL) 10 MG tablet Take 10 mg by mouth daily. Reported on 06/03/2015    [provider]  predniSONE (DELTASONE) 20 MG tablet Take 2 tablets (40 mg total) by mouth daily. 03/10/17   Blanchie Dessert, MD  sildenafil (REVATIO) 20 MG tablet Reported on 06/03/2015 04/30/15   [provider]    Family History History reviewed. No pertinent family history.  Social History Social History   Tobacco Use  . Smoking status: Current Every Day Smoker  . Smokeless tobacco: Never Used  Substance Use Topics  . Alcohol use: Yes    Alcohol/week: 0.0 standard drinks    Comment: 3 beers weekly  . Drug use: No     Allergies   Darvon [propoxyphene]   Review of Systems Review of Systems  All systems reviewed and negative, other than as noted in HPI.  Physical Exam Updated Vital Signs BP 132/68 (BP Location: Left Arm)   Pulse 82   Temp 98.3 F (36.8 C) (Oral)   Resp 20   Ht 6\' 1"  (1.854 m)   Wt 99.8 kg   SpO2 98%   BMI 29.03 kg/m   Physical Exam Vitals signs and nursing note reviewed.  Constitutional:      General: He is not in acute distress.    Appearance: He is  well-developed.  HENT:     Head: Normocephalic.     Comments: Tenderness to palpation of the right face and foot on the right ear.  Faint erythema.  No fluctuance/mass appreciated.  No intraoral lesions noted.  Neck supple.  No adenopathy. Eyes:     General:        Right eye: No discharge.        Left eye: No discharge.     Conjunctiva/sclera: Conjunctivae normal.  Neck:     Musculoskeletal: Neck supple.  Cardiovascular:     Rate and Rhythm: Normal rate and regular rhythm.     Heart sounds: Normal heart sounds. No murmur. No friction rub. No gallop.   Pulmonary:     Effort: Pulmonary effort is normal. No respiratory distress.     Breath sounds: Normal breath sounds.  Abdominal:     General: There is no distension.      Palpations: Abdomen is soft.     Tenderness: There is no abdominal tenderness.  Musculoskeletal:        General: No tenderness.  Skin:    General: Skin is warm and dry.  Neurological:     Mental Status: He is alert.  Psychiatric:        Behavior: Behavior normal.        Thought Content: Thought content normal.      ED Treatments / Results  Labs (all labs ordered are listed, but only abnormal results are displayed) Labs Reviewed  CBC WITH DIFFERENTIAL/PLATELET - Abnormal; Notable for the following components:      Result Value   RBC 3.36 (*)    Hemoglobin 11.5 (*)    HCT 35.4 (*)    MCV 105.4 (*)    MCH 34.2 (*)    Platelets 146 (*)    Lymphs Abs 0.5 (*)    All other components within normal limits  BASIC METABOLIC PANEL - Abnormal; Notable for the following components:   Glucose, Bld 116 (*)    Calcium 8.7 (*)    All other components within normal limits    EKG None  Radiology No results found.  Procedures Procedures (including critical care time)  Medications Ordered in ED Medications  ketorolac (TORADOL) 15 MG/ML injection 15 mg (15 mg Intravenous Given 01/15/19 1721)  HYDROmorphone (DILAUDID) injection 0.5 mg (0.5 mg Intravenous Given 01/15/19 1720)  iohexol (OMNIPAQUE) 300 MG/ML solution 100 mL (100 mLs Intravenous Contrast Given 01/15/19 1800)  vancomycin (VANCOCIN) 2,000 mg in sodium chloride 0.9 % 500 mL IVPB ( Intravenous Stopped 01/15/19 2206)     Initial Impression / Assessment and Plan / ED Course  I have reviewed the triage vital signs and the nursing notes.  Pertinent labs & imaging results that were available during my care of the patient were reviewed by me and considered in my medical decision making (see chart for details).       73 year old male with right shoulder pain which is chronic in nature.  Probably rotator cuff tear.  Orthopedic follow-up.  Additionally pain and mild swelling to right face.  CT is consistent with a  cellulitis.  Nontoxic.  Antibiotics.  As needed pain medication.  Close outpatient follow-up otherwise.  Return precautions discussed.  Final Clinical Impressions(s) / ED Diagnoses   Final diagnoses:  Facial cellulitis  Right shoulder pain, unspecified chronicity    ED Discharge Orders         Ordered    cephALEXin (KEFLEX) 500 MG capsule  3 times daily  01/15/19 1954    HYDROcodone-acetaminophen (NORCO/VICODIN) 5-325 MG tablet  Every 6 hours PRN     01/15/19 2008           Virgel Manifold, MD 01/19/19 [phone]

## 2019-07-22 ENCOUNTER — Inpatient Hospital Stay (HOSPITAL_BASED_OUTPATIENT_CLINIC_OR_DEPARTMENT_OTHER)
Admission: EM | Admit: 2019-07-22 | Discharge: 2019-08-07 | DRG: 329 | Disposition: A | Discharge status: Home Health | Payer: Medicare Other | Attending: Internal Medicine | Admitting: Internal Medicine

## 2019-07-22 ENCOUNTER — Emergency Department (HOSPITAL_BASED_OUTPATIENT_CLINIC_OR_DEPARTMENT_OTHER): Payer: Medicare Other

## 2019-07-22 ENCOUNTER — Other Ambulatory Visit: Payer: Self-pay

## 2019-07-22 ENCOUNTER — Encounter (HOSPITAL_BASED_OUTPATIENT_CLINIC_OR_DEPARTMENT_OTHER): Payer: Self-pay | Admitting: *Deleted

## 2019-07-22 DIAGNOSIS — D649 Anemia, unspecified: Secondary | ICD-10-CM | POA: Diagnosis present

## 2019-07-22 DIAGNOSIS — I4891 Unspecified atrial fibrillation: Secondary | ICD-10-CM | POA: Diagnosis not present

## 2019-07-22 DIAGNOSIS — I493 Ventricular premature depolarization: Secondary | ICD-10-CM | POA: Diagnosis not present

## 2019-07-22 DIAGNOSIS — I472 Ventricular tachycardia: Secondary | ICD-10-CM | POA: Diagnosis present

## 2019-07-22 DIAGNOSIS — R112 Nausea with vomiting, unspecified: Secondary | ICD-10-CM | POA: Diagnosis present

## 2019-07-22 DIAGNOSIS — K5652 Intestinal adhesions [bands] with complete obstruction: Secondary | ICD-10-CM | POA: Diagnosis present

## 2019-07-22 DIAGNOSIS — I110 Hypertensive heart disease with heart failure: Secondary | ICD-10-CM | POA: Diagnosis not present

## 2019-07-22 DIAGNOSIS — Z9981 Dependence on supplemental oxygen: Secondary | ICD-10-CM

## 2019-07-22 DIAGNOSIS — Z79899 Other long term (current) drug therapy: Secondary | ICD-10-CM

## 2019-07-22 DIAGNOSIS — Z9049 Acquired absence of other specified parts of digestive tract: Secondary | ICD-10-CM

## 2019-07-22 DIAGNOSIS — J449 Chronic obstructive pulmonary disease, unspecified: Secondary | ICD-10-CM | POA: Diagnosis present

## 2019-07-22 DIAGNOSIS — J441 Chronic obstructive pulmonary disease with (acute) exacerbation: Secondary | ICD-10-CM | POA: Diagnosis not present

## 2019-07-22 DIAGNOSIS — I10 Essential (primary) hypertension: Secondary | ICD-10-CM | POA: Diagnosis not present

## 2019-07-22 DIAGNOSIS — E876 Hypokalemia: Secondary | ICD-10-CM | POA: Diagnosis not present

## 2019-07-22 DIAGNOSIS — Z4659 Encounter for fitting and adjustment of other gastrointestinal appliance and device: Secondary | ICD-10-CM

## 2019-07-22 DIAGNOSIS — Z01810 Encounter for preprocedural cardiovascular examination: Secondary | ICD-10-CM | POA: Diagnosis not present

## 2019-07-22 DIAGNOSIS — I959 Hypotension, unspecified: Secondary | ICD-10-CM | POA: Diagnosis not present

## 2019-07-22 DIAGNOSIS — Z7982 Long term (current) use of aspirin: Secondary | ICD-10-CM | POA: Diagnosis not present

## 2019-07-22 DIAGNOSIS — E860 Dehydration: Secondary | ICD-10-CM | POA: Diagnosis present

## 2019-07-22 DIAGNOSIS — I714 Abdominal aortic aneurysm, without rupture: Secondary | ICD-10-CM | POA: Diagnosis present

## 2019-07-22 DIAGNOSIS — M069 Rheumatoid arthritis, unspecified: Secondary | ICD-10-CM | POA: Diagnosis present

## 2019-07-22 DIAGNOSIS — I252 Old myocardial infarction: Secondary | ICD-10-CM

## 2019-07-22 DIAGNOSIS — Z20822 Contact with and (suspected) exposure to covid-19: Secondary | ICD-10-CM | POA: Diagnosis present

## 2019-07-22 DIAGNOSIS — I4819 Other persistent atrial fibrillation: Secondary | ICD-10-CM | POA: Diagnosis not present

## 2019-07-22 DIAGNOSIS — Z888 Allergy status to other drugs, medicaments and biological substances status: Secondary | ICD-10-CM | POA: Diagnosis not present

## 2019-07-22 DIAGNOSIS — B159 Hepatitis A without hepatic coma: Secondary | ICD-10-CM | POA: Diagnosis present

## 2019-07-22 DIAGNOSIS — F17210 Nicotine dependence, cigarettes, uncomplicated: Secondary | ICD-10-CM | POA: Diagnosis present

## 2019-07-22 DIAGNOSIS — J9811 Atelectasis: Secondary | ICD-10-CM | POA: Diagnosis not present

## 2019-07-22 DIAGNOSIS — Z0189 Encounter for other specified special examinations: Secondary | ICD-10-CM

## 2019-07-22 DIAGNOSIS — J9601 Acute respiratory failure with hypoxia: Secondary | ICD-10-CM | POA: Diagnosis not present

## 2019-07-22 DIAGNOSIS — N179 Acute kidney failure, unspecified: Secondary | ICD-10-CM | POA: Diagnosis present

## 2019-07-22 DIAGNOSIS — K56609 Unspecified intestinal obstruction, unspecified as to partial versus complete obstruction: Secondary | ICD-10-CM | POA: Diagnosis not present

## 2019-07-22 DIAGNOSIS — Z452 Encounter for adjustment and management of vascular access device: Secondary | ICD-10-CM

## 2019-07-22 DIAGNOSIS — I482 Chronic atrial fibrillation, unspecified: Secondary | ICD-10-CM | POA: Diagnosis not present

## 2019-07-22 DIAGNOSIS — R0600 Dyspnea, unspecified: Secondary | ICD-10-CM

## 2019-07-22 DIAGNOSIS — I5031 Acute diastolic (congestive) heart failure: Secondary | ICD-10-CM

## 2019-07-22 DIAGNOSIS — R0602 Shortness of breath: Secondary | ICD-10-CM

## 2019-07-22 HISTORY — DX: Rheumatoid arthritis, unspecified: M06.9

## 2019-07-22 HISTORY — DX: Abdominal aortic aneurysm, without rupture, unspecified: I71.40

## 2019-07-22 HISTORY — DX: Abdominal aortic aneurysm, without rupture: I71.4

## 2019-07-22 HISTORY — DX: Paroxysmal atrial fibrillation: I48.0

## 2019-07-22 LAB — URINALYSIS, ROUTINE W REFLEX MICROSCOPIC
Glucose, UA: NEGATIVE mg/dL
Hgb urine dipstick: NEGATIVE
Ketones, ur: NEGATIVE mg/dL
Leukocytes,Ua: NEGATIVE
Nitrite: NEGATIVE
Protein, ur: NEGATIVE mg/dL
Specific Gravity, Urine: 1.02 (ref 1.005–1.030)
pH: 5 (ref 5.0–8.0)

## 2019-07-22 LAB — CBC WITH DIFFERENTIAL/PLATELET
Abs Immature Granulocytes: 0.04 10*3/uL (ref 0.00–0.07)
Basophils Absolute: 0 10*3/uL (ref 0.0–0.1)
Basophils Relative: 0 %
Eosinophils Absolute: 0 10*3/uL (ref 0.0–0.5)
Eosinophils Relative: 0 %
HCT: 43.5 % (ref 39.0–52.0)
Hemoglobin: 14.8 g/dL (ref 13.0–17.0)
Immature Granulocytes: 1 %
Lymphocytes Relative: 8 %
Lymphs Abs: 0.7 10*3/uL (ref 0.7–4.0)
MCH: 35.4 pg — ABNORMAL HIGH (ref 26.0–34.0)
MCHC: 34 g/dL (ref 30.0–36.0)
MCV: 104.1 fL — ABNORMAL HIGH (ref 80.0–100.0)
Monocytes Absolute: 0.9 10*3/uL (ref 0.1–1.0)
Monocytes Relative: 11 %
Neutro Abs: 6.4 10*3/uL (ref 1.7–7.7)
Neutrophils Relative %: 80 %
Platelets: 256 10*3/uL (ref 150–400)
RBC: 4.18 MIL/uL — ABNORMAL LOW (ref 4.22–5.81)
RDW: 14.5 % (ref 11.5–15.5)
WBC: 8 10*3/uL (ref 4.0–10.5)
nRBC: 0 % (ref 0.0–0.2)

## 2019-07-22 LAB — COMPREHENSIVE METABOLIC PANEL
ALT: 18 U/L (ref 0–44)
AST: 21 U/L (ref 15–41)
Albumin: 4.4 g/dL (ref 3.5–5.0)
Alkaline Phosphatase: 68 U/L (ref 38–126)
Anion gap: 17 — ABNORMAL HIGH (ref 5–15)
BUN: 36 mg/dL — ABNORMAL HIGH (ref 8–23)
CO2: 32 mmol/L (ref 22–32)
Calcium: 9.1 mg/dL (ref 8.9–10.3)
Chloride: 91 mmol/L — ABNORMAL LOW (ref 98–111)
Creatinine, Ser: 1.37 mg/dL — ABNORMAL HIGH (ref 0.61–1.24)
GFR calc Af Amer: 58 mL/min — ABNORMAL LOW (ref >60)
GFR calc non Af Amer: 50 mL/min — ABNORMAL LOW (ref >60)
Glucose, Bld: 143 mg/dL — ABNORMAL HIGH (ref 70–99)
Potassium: 3.5 mmol/L (ref 3.5–5.1)
Sodium: 140 mmol/L (ref 135–145)
Total Bilirubin: 2.1 mg/dL — ABNORMAL HIGH (ref 0.3–1.2)
Total Protein: 7.4 g/dL (ref 6.5–8.1)

## 2019-07-22 LAB — SARS CORONAVIRUS 2 BY RT PCR (HOSPITAL ORDER, PERFORMED IN ~~LOC~~ HOSPITAL LAB): SARS Coronavirus 2: NEGATIVE

## 2019-07-22 LAB — LIPASE, BLOOD: Lipase: 19 U/L (ref 11–51)

## 2019-07-22 MED ORDER — SODIUM CHLORIDE 0.9 % IV SOLN: INTRAVENOUS | Status: DC

## 2019-07-22 MED ORDER — MORPHINE SULFATE (PF) 4 MG/ML IV SOLN
4.0000 mg | Freq: Once | INTRAVENOUS | Status: AC
  Administered 2019-07-22: 4 mg via INTRAVENOUS
  Filled 2019-07-22: qty 1

## 2019-07-22 MED ORDER — IOHEXOL 300 MG/ML  SOLN
100.0000 mL | Freq: Once | INTRAMUSCULAR | Status: AC
  Administered 2019-07-22: 100 mL via INTRAVENOUS

## 2019-07-22 MED ORDER — LIDOCAINE HCL URETHRAL/MUCOSAL 2 % EX GEL
1.0000 "application " | Freq: Once | CUTANEOUS | Status: AC
  Administered 2019-07-22: 1

## 2019-07-22 MED ORDER — LORAZEPAM 2 MG/ML IJ SOLN: 1.0000 mg | Freq: Once | INTRAMUSCULAR | Status: DC | PRN

## 2019-07-22 MED ORDER — LIDOCAINE HCL URETHRAL/MUCOSAL 2 % EX GEL
CUTANEOUS | Status: AC
  Administered 2019-07-22: 1
  Filled 2019-07-22: qty 20

## 2019-07-22 MED ORDER — CHLORHEXIDINE GLUCONATE CLOTH 2 % EX PADS
6.0000 | MEDICATED_PAD | Freq: Every day | CUTANEOUS | Status: DC
  Administered 2019-07-23 – 2019-08-06 (×14): 6 via TOPICAL

## 2019-07-22 MED ORDER — ORAL CARE MOUTH RINSE: 15.0000 mL | Freq: Two times a day (BID) | OROMUCOSAL | Status: DC

## 2019-07-22 MED ORDER — LORAZEPAM 2 MG/ML IJ SOLN
INTRAMUSCULAR | Status: AC
  Filled 2019-07-22: qty 1

## 2019-07-22 MED ORDER — DILTIAZEM HCL 25 MG/5ML IV SOLN
15.0000 mg | Freq: Once | INTRAVENOUS | Status: AC
  Administered 2019-07-22: 15 mg via INTRAVENOUS
  Filled 2019-07-22: qty 5

## 2019-07-22 MED ORDER — ONDANSETRON HCL 4 MG/2ML IJ SOLN
4.0000 mg | Freq: Once | INTRAMUSCULAR | Status: AC
  Administered 2019-07-22: 4 mg via INTRAVENOUS
  Filled 2019-07-22: qty 2

## 2019-07-22 MED ORDER — CHLORHEXIDINE GLUCONATE 0.12 % MT SOLN
15.0000 mL | Freq: Two times a day (BID) | OROMUCOSAL | Status: DC
  Administered 2019-07-23 – 2019-08-07 (×24): 15 mL via OROMUCOSAL
  Filled 2019-07-22 (×22): qty 15

## 2019-07-22 NOTE — ED Provider Notes (Signed)
Jake [name] Provider Note   CSN: [id number] Arrival date & time: 07/22/19  1802     History Chief Complaint  Patient presents with  . Abdominal Pain  . Constipation    Jake [name] is a 74 y.o. male.  HPI   74 year old male with abdominal pain and nausea/vomiting.  Symptom onset about 2 days ago.  Persistent since then.  Sometimes gets waves of more intense pain without appreciable exacerbating leaving factors.  Pain is across the lower abdomen.  Does not lateralize.  Associate with nausea and vomiting.  Does not have a bowel movement about 3 days.  No fevers or chills.  No urinary complaints.  Surgical history significant for what sounds like a partial colectomy?  From patient's description, it sounds like he had some polyps that they could not remove endoscopically.  Past Medical History:  Diagnosis Date  . COPD (chronic obstructive pulmonary disease) ([place])   . Hepatitis A   . Hypertension     Patient Active Problem List   Diagnosis Date Noted  . Plantar fasciitis of left foot 06/03/2015  . 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 Metatarsal deformity 06/03/2015    Past Surgical History:  Procedure Laterality Date  . BACK SURGERY    . FRACTURE SURGERY    . JOINT REPLACEMENT         No family history on file.  Social History   Tobacco Use  . Smoking status: Current Every Day Smoker  . Smokeless tobacco: Never Used  Substance Use Topics  . Alcohol use: Yes    Alcohol/week: 0.0 standard drinks    Comment: 3 beers weekly  . Drug use: No    Home Medications Prior to Admission medications   Medication Sig Start Date End Date Taking? Authorizing Provider  aspirin 81 MG tablet Take 81 mg by mouth daily.   Yes [provider]  gabapentin (NEURONTIN) 300 MG capsule Take 300 mg by mouth 3 (three) times daily. Reported on 06/03/2015   Yes [provider]  hydrochlorothiazide (HYDRODIURIL) 25 MG tablet Take  25 mg by mouth daily. Reported on 06/03/2015   Yes [provider]  lisinopril (PRINIVIL,ZESTRIL) 10 MG tablet Take 10 mg by mouth daily. Reported on 06/03/2015   Yes [provider]  cephALEXin (KEFLEX) 500 MG capsule Take 1 capsule (500 mg total) by mouth 3 (three) times daily. 01/15/19   Virgel Manifold, MD  chlorpheniramine-HYDROcodone [facility] PENNKINETIC ER) 10-8 MG/5ML SUER Take 5 mLs by mouth every 12 (twelve) hours as needed for cough. 03/10/17   Blanchie Dessert, MD  HYDROcodone-acetaminophen (NORCO/VICODIN) 5-325 MG tablet Take 1 tablet by mouth every 6 (six) hours as needed. 01/15/19   Virgel Manifold, MD  levofloxacin (LEVAQUIN) 500 MG tablet Take 1 tablet (500 mg total) by mouth daily. 03/10/17   Blanchie Dessert, MD  predniSONE (DELTASONE) 20 MG tablet Take 2 tablets (40 mg total) by mouth daily. 03/10/17   Blanchie Dessert, MD  sildenafil (REVATIO) 20 MG tablet Reported on 06/03/2015 04/30/15   [provider]    Allergies    Darvon [propoxyphene]  Review of Systems   Review of Systems All systems reviewed and negative, other than as noted in HPI.  Physical Exam Updated Vital Signs BP (!) 127/92   Pulse 97   Temp 97.7 F (36.5 C) (Oral)   Resp 20   Ht 6\' 1"  (1.854 m)   Wt 95.3 kg   SpO2 93%   BMI 27.71  kg/m   Physical Exam Vitals and nursing note reviewed.  Constitutional:      General: He is not in acute distress.    Appearance: He is well-developed.  HENT:     Head: Normocephalic and atraumatic.  Eyes:     General:        Right eye: No discharge.        Left eye: No discharge.     Conjunctiva/sclera: Conjunctivae normal.  Cardiovascular:     Rate and Rhythm: Normal rate and regular rhythm.     Heart sounds: Normal heart sounds. No murmur. No friction rub. No gallop.   Pulmonary:     Effort: Pulmonary effort is normal. No respiratory distress.     Breath sounds: Normal breath sounds.  Abdominal:     General: There is distension.      Palpations: Abdomen is soft.     Tenderness: There is abdominal tenderness.  Musculoskeletal:        General: No tenderness.     Cervical back: Neck supple.  Skin:    General: Skin is warm and dry.  Neurological:     Mental Status: He is alert.  Psychiatric:        Behavior: Behavior normal.        Thought Content: Thought content normal.     ED Results / Procedures / Treatments   Labs (all labs ordered are listed, but only abnormal results are displayed) Labs Reviewed  CBC WITH DIFFERENTIAL/PLATELET - Abnormal; Notable for the following components:      Result Value   RBC 4.18 (*)    MCV 104.1 (*)    MCH 35.4 (*)    All other components within normal limits  COMPREHENSIVE METABOLIC PANEL - Abnormal; Notable for the following components:   Chloride 91 (*)    Glucose, Bld 143 (*)    BUN 36 (*)    Creatinine, Ser 1.37 (*)    Total Bilirubin 2.1 (*)    GFR calc non Af Amer 50 (*)    GFR calc Af Amer 58 (*)    Anion gap 17 (*)    All other components within normal limits  URINALYSIS, ROUTINE W REFLEX MICROSCOPIC - Abnormal; Notable for the following components:   Bilirubin Urine SMALL (*)    All other components within normal limits  CBC - Abnormal; Notable for the following components:   RBC 3.69 (*)    MCV 109.5 (*)    MCH 35.5 (*)    All other components within normal limits  BASIC METABOLIC PANEL - Abnormal; Notable for the following components:   Chloride 93 (*)    CO2 33 (*)    Glucose, Bld 110 (*)    BUN 38 (*)    Creatinine, Ser 1.43 (*)    Calcium 8.8 (*)    GFR calc non Af Amer 48 (*)    GFR calc Af Amer 55 (*)    Anion gap 17 (*)    All other components within normal limits  GLUCOSE, CAPILLARY - Abnormal; Notable for the following components:   Glucose-Capillary 115 (*)    All other components within normal limits  GLUCOSE, CAPILLARY - Abnormal; Notable for the following components:   Glucose-Capillary 116 (*)    All other components within  normal limits  HEPARIN LEVEL (UNFRACTIONATED) - Abnormal; Notable for the following components:   Heparin Unfractionated 0.15 (*)    All other components within normal limits  BASIC METABOLIC PANEL - Abnormal;  Notable for the following components:   Chloride 94 (*)    CO2 38 (*)    Glucose, Bld 122 (*)    BUN 33 (*)    Calcium 8.7 (*)    All other components within normal limits  CBC - Abnormal; Notable for the following components:   RBC 3.65 (*)    MCV 109.3 (*)    MCH 35.6 (*)    All other components within normal limits  GLUCOSE, CAPILLARY - Abnormal; Notable for the following components:   Glucose-Capillary 105 (*)    All other components within normal limits  GLUCOSE, CAPILLARY - Abnormal; Notable for the following components:   Glucose-Capillary 141 (*)    All other components within normal limits  TROPONIN I (HIGH SENSITIVITY) - Abnormal; Notable for the following components:   Troponin I (High Sensitivity) 24 (*)    All other components within normal limits  SARS CORONAVIRUS 2 BY RT PCR (HOSPITAL ORDER, [facility])  MRSA PCR SCREENING  LIPASE, BLOOD  TSH  MAGNESIUM  LACTIC ACID, PLASMA  LACTIC ACID, PLASMA  PROTIME-INR  MAGNESIUM  HEPARIN LEVEL (UNFRACTIONATED)    EKG None  Radiology CT ABDOMEN PELVIS W CONTRAST  Result Date: 07/22/2019 CLINICAL DATA:  Suspected bowel obstruction. Vomiting for 2 days. Hepatitis C. Hypertension. COPD. EXAM: CT ABDOMEN AND PELVIS WITH CONTRAST TECHNIQUE: Multidetector CT imaging of the abdomen and pelvis was performed using the standard protocol following bolus administration of intravenous contrast. CONTRAST:  138mL OMNIPAQUE IOHEXOL 300 MG/ML  SOLN COMPARISON:  12/28/2018 abdominal ultrasound from [place]. CT of 05/18/2016, report only available and reviewed. A CT from [place] of 06/24/2015 is reviewed. FINDINGS: Lower chest: Clear lung bases. Normal heart size without  pericardial or pleural effusion. Right coronary artery atherosclerosis. Hepatobiliary: Mild cirrhosis, as evidenced by irregular hepatic capsule. No focal liver lesion. Normal gallbladder, without biliary ductal dilatation. Pancreas: Normal, without mass or ductal dilatation. Spleen: Normal in size, without focal abnormality. Adrenals/Urinary Tract: Normal adrenal glands. Mild renal cortical thinning bilaterally. Interpolar left renal 1.6 cm cyst. Left renal collecting system calculi of up to 4 mm. Normal urinary bladder. Stomach/Bowel: Normal stomach, without wall thickening. Surgical changes at the rectosigmoid junction. Normal caliber of the colon. Normal terminal ileum. Moderately distended proximal and mid small bowel loops, fluid-filled. This continues to the level of a transition within the mid ileum, including on 58/2. There may be a second adjacent transition identified just lateral to this. minimal edema within subtending mesentery of dilated bowel loops, including on 62/2. Vascular/Lymphatic: Aortic and branch vessel atherosclerosis. Infrarenal aortic dilatation of maximally 3.1 cm. No abdominopelvic adenopathy. Reproductive: Normal prostate. Other: Trace fluid within the cul-de-sac, including on 74/2. No free intraperitoneal air. Musculoskeletal: Moderate compression deformity involving the L1 vertebral body, described on 05/29/2019 radiograph. IMPRESSION: 1. Mid to distal small bowel obstruction, presumably secondary to adhesions. Possible second adjacent transition point, as can be seen with close loop obstruction. Consider surgical consultation. 2. Although there are no specific signs of complicating ischemia, there is trace pelvic fluid and edema/fluid within the subtending small-bowel mesentery. 3. Left nephrolithiasis. 4. Coronary artery atherosclerosis. Aortic Atherosclerosis (ICD10-I70.0). 5. Mild cirrhosis. Electronically Signed   By: Abigail Miyamoto M.D.   On: 07/22/2019 20:20     Procedures Procedures (including critical care time)  Medications Ordered in ED Medications  ondansetron (ZOFRAN) injection 4 mg (has no administration in time range)  morphine 4 MG/ML injection 4 mg (has  no administration in time range)  0.9 %  sodium chloride infusion (has no administration in time range)    ED Course  I have reviewed the triage vital signs and the nursing notes.  Pertinent labs & imaging results that were available during my care of the patient were reviewed by me and considered in my medical decision making (see chart for details).    MDM Rules/Calculators/A&P                      74 year old male with lower abdominal pain and nausea/vomiting.  Distended on exam.  Actively vomiting.  Prior abdominal surgery.  Suspect he probably has a small bowel obstruction.  Will place an IV.  Fluids.  Pain medicine.  Antiemetics.  Labs and urinalysis.  CT the abdomen and pelvis.  CT does show a bowel obstruction.  NG tube placed.  Will discuss with surgery.  Anticipate admission to [facility] on the medical service.  Final Clinical Impression(s) / ED Diagnoses Final diagnoses:  SBO (small bowel obstruction) ([place])    Rx / DC Orders ED Discharge Orders    None       Virgel Manifold, MD 07/24/19 [phone]

## 2019-07-22 NOTE — ED Triage Notes (Signed)
Abdominal pain. No BM in 3 days. Vomiting x 2 days. He took ex lax yesterday with no BM.

## 2019-07-22 NOTE — ED Notes (Signed)
Pt unable to urinate at this time.  

## 2019-07-22 NOTE — ED Notes (Signed)
Pt transported to CT ?

## 2019-07-23 ENCOUNTER — Encounter (HOSPITAL_COMMUNITY): Payer: Self-pay | Admitting: Internal Medicine

## 2019-07-23 ENCOUNTER — Inpatient Hospital Stay (HOSPITAL_COMMUNITY): Payer: Medicare Other

## 2019-07-23 ENCOUNTER — Encounter: Payer: Self-pay | Admitting: Surgery

## 2019-07-23 DIAGNOSIS — I4891 Unspecified atrial fibrillation: Secondary | ICD-10-CM

## 2019-07-23 DIAGNOSIS — Z01810 Encounter for preprocedural cardiovascular examination: Secondary | ICD-10-CM

## 2019-07-23 DIAGNOSIS — I472 Ventricular tachycardia: Secondary | ICD-10-CM

## 2019-07-23 DIAGNOSIS — L711 Rhinophyma: Secondary | ICD-10-CM | POA: Insufficient documentation

## 2019-07-23 DIAGNOSIS — I4819 Other persistent atrial fibrillation: Secondary | ICD-10-CM

## 2019-07-23 LAB — CBC
HCT: 40.4 % (ref 39.0–52.0)
Hemoglobin: 13.1 g/dL (ref 13.0–17.0)
MCH: 35.5 pg — ABNORMAL HIGH (ref 26.0–34.0)
MCHC: 32.4 g/dL (ref 30.0–36.0)
MCV: 109.5 fL — ABNORMAL HIGH (ref 80.0–100.0)
Platelets: 209 10*3/uL (ref 150–400)
RBC: 3.69 MIL/uL — ABNORMAL LOW (ref 4.22–5.81)
RDW: 14.5 % (ref 11.5–15.5)
WBC: 6.8 10*3/uL (ref 4.0–10.5)
nRBC: 0 % (ref 0.0–0.2)

## 2019-07-23 LAB — BASIC METABOLIC PANEL
Anion gap: 17 — ABNORMAL HIGH (ref 5–15)
BUN: 38 mg/dL — ABNORMAL HIGH (ref 8–23)
CO2: 33 mmol/L — ABNORMAL HIGH (ref 22–32)
Calcium: 8.8 mg/dL — ABNORMAL LOW (ref 8.9–10.3)
Chloride: 93 mmol/L — ABNORMAL LOW (ref 98–111)
Creatinine, Ser: 1.43 mg/dL — ABNORMAL HIGH (ref 0.61–1.24)
GFR calc Af Amer: 55 mL/min — ABNORMAL LOW (ref >60)
GFR calc non Af Amer: 48 mL/min — ABNORMAL LOW (ref >60)
Glucose, Bld: 110 mg/dL — ABNORMAL HIGH (ref 70–99)
Potassium: 4.7 mmol/L (ref 3.5–5.1)
Sodium: 143 mmol/L (ref 135–145)

## 2019-07-23 LAB — LACTIC ACID, PLASMA
Lactic Acid, Venous: 1.2 mmol/L (ref 0.5–1.9)
Lactic Acid, Venous: 1.2 mmol/L (ref 0.5–1.9)

## 2019-07-23 LAB — ECHOCARDIOGRAM COMPLETE
Height: 73 in
Weight: 3252.23 oz

## 2019-07-23 LAB — GLUCOSE, CAPILLARY
Glucose-Capillary: 105 mg/dL — ABNORMAL HIGH (ref 70–99)
Glucose-Capillary: 115 mg/dL — ABNORMAL HIGH (ref 70–99)
Glucose-Capillary: 116 mg/dL — ABNORMAL HIGH (ref 70–99)

## 2019-07-23 LAB — MRSA PCR SCREENING: MRSA by PCR: NEGATIVE

## 2019-07-23 LAB — MAGNESIUM: Magnesium: 1.9 mg/dL (ref 1.7–2.4)

## 2019-07-23 LAB — TSH: TSH: 1.224 u[IU]/mL (ref 0.350–4.500)

## 2019-07-23 LAB — TROPONIN I (HIGH SENSITIVITY): Troponin I (High Sensitivity): 24 ng/L — ABNORMAL HIGH (ref <18)

## 2019-07-23 MED ORDER — DILTIAZEM HCL-DEXTROSE 125-5 MG/125ML-% IV SOLN (PREMIX)
5.0000 mg/h | INTRAVENOUS | Status: AC
  Administered 2019-07-23: 5 mg/h via INTRAVENOUS
  Administered 2019-07-24 – 2019-07-25 (×5): 15 mg/h via INTRAVENOUS
  Filled 2019-07-23 (×7): qty 125

## 2019-07-23 MED ORDER — ONDANSETRON HCL 4 MG PO TABS: 4.0000 mg | ORAL_TABLET | Freq: Four times a day (QID) | ORAL | Status: DC | PRN

## 2019-07-23 MED ORDER — LEVALBUTEROL HCL 0.63 MG/3ML IN NEBU
0.6300 mg | INHALATION_SOLUTION | Freq: Three times a day (TID) | RESPIRATORY_TRACT | Status: DC
  Administered 2019-07-23 – 2019-07-28 (×13): 0.63 mg via RESPIRATORY_TRACT
  Filled 2019-07-23 (×13): qty 3

## 2019-07-23 MED ORDER — DEXTROSE-NACL 5-0.9 % IV SOLN: INTRAVENOUS | Status: AC

## 2019-07-23 MED ORDER — DILTIAZEM HCL 25 MG/5ML IV SOLN
15.0000 mg | Freq: Once | INTRAVENOUS | Status: AC
  Administered 2019-07-23: 15 mg via INTRAVENOUS
  Filled 2019-07-23: qty 5

## 2019-07-23 MED ORDER — HEPARIN BOLUS VIA INFUSION
4000.0000 [IU] | Freq: Once | INTRAVENOUS | Status: AC
  Administered 2019-07-23: 4000 [IU] via INTRAVENOUS
  Filled 2019-07-23: qty 4000

## 2019-07-23 MED ORDER — ONDANSETRON HCL 4 MG/2ML IJ SOLN
4.0000 mg | Freq: Four times a day (QID) | INTRAMUSCULAR | Status: DC | PRN
  Administered 2019-07-24 – 2019-07-27 (×2): 4 mg via INTRAVENOUS
  Filled 2019-07-23 (×2): qty 2

## 2019-07-23 MED ORDER — DIATRIZOATE MEGLUMINE & SODIUM 66-10 % PO SOLN
90.0000 mL | Freq: Once | ORAL | Status: AC
  Administered 2019-07-23: 90 mL via NASOGASTRIC
  Filled 2019-07-23: qty 90

## 2019-07-23 MED ORDER — LEVALBUTEROL HCL 0.63 MG/3ML IN NEBU
0.6300 mg | INHALATION_SOLUTION | Freq: Four times a day (QID) | RESPIRATORY_TRACT | Status: DC
  Administered 2019-07-23: 0.63 mg via RESPIRATORY_TRACT
  Filled 2019-07-23: qty 3

## 2019-07-23 MED ORDER — ORAL CARE MOUTH RINSE
15.0000 mL | Freq: Two times a day (BID) | OROMUCOSAL | Status: DC
  Administered 2019-07-23 – 2019-08-06 (×19): 15 mL via OROMUCOSAL

## 2019-07-23 MED ORDER — LABETALOL HCL 5 MG/ML IV SOLN: 10.0000 mg | INTRAVENOUS | Status: DC | PRN

## 2019-07-23 MED ORDER — HEPARIN (PORCINE) 25000 UT/250ML-% IV SOLN
1250.0000 [IU]/h | INTRAVENOUS | Status: DC
  Administered 2019-07-23: 1100 [IU]/h via INTRAVENOUS
  Administered 2019-07-24: 1250 [IU]/h via INTRAVENOUS
  Filled 2019-07-23: qty 250

## 2019-07-23 MED ORDER — HYDROMORPHONE HCL 1 MG/ML IJ SOLN
0.5000 mg | INTRAMUSCULAR | Status: DC | PRN
  Administered 2019-07-23 – 2019-07-24 (×6): 0.5 mg via INTRAVENOUS
  Filled 2019-07-23 (×7): qty 1

## 2019-07-23 MED ORDER — LEVALBUTEROL HCL 0.63 MG/3ML IN NEBU: 0.6300 mg | INHALATION_SOLUTION | Freq: Four times a day (QID) | RESPIRATORY_TRACT | Status: DC

## 2019-07-23 NOTE — Progress Notes (Signed)
Patient was admitted early morning with small bowel obstruction and new onset of atrial fibrillation.  He received 1 dose of IV bolus Cardizem in the ED.  General surgery was consulted through the ED.  Patient was admitted under hospitalist service.  Patient seen and examined ICU.  He told me that his abdomen is feeling much better.  He is not passing gas but he still has pain.  Pain has improved but is still significant.  No nausea.  He had NG tube placed in the ED per General surgery recommendations.  He was still in the atrial fibrillation however his rates were less than 100 and he was not having any chest pain or shortness of breath.  On examination, he was alert and oriented, lungs clear to auscultation.  Abdomen slightly distended with absent bowel sounds.  No peripheral edema.  He was later seen by general surgery.  They plan on continuing conservative management.  Lactic acid is negative which is reassuring.  Cardiology also on board.  They have started him on scheduled IV Cardizem.  Will defer to them about further decision about anticoagulation however I do not see any contraindications to heparin.

## 2019-07-23 NOTE — Progress Notes (Signed)
Notified [place] of 40 beat run of [place]. Pt asymptomatic and asleep. MD gave verbal order to add stat mag and troponin to BMP. Cardizem 15mg  bolus also ordered

## 2019-07-23 NOTE — H&P (Addendum)
History and Physical    Jake [name] FXT:[id number] DOB: [birth year] DOA: 07/22/2019  PCP: Glendon Axe, MD   Patient coming from: Home.  Chief Complaint: Abdominal pain and nausea vomiting.  HPI: Jake [name] is a 74 y.o. male with history of hypertension rheumatoid arthritis COPD presents to the ER with complaints of abdominal pain and nausea vomiting.  Abdominal pain is generalized has been going on for last 2 days with multiple episodes of nausea vomiting.  Last bowel movement was 3 days ago.  ED Course: In the ER CT abdomen pelvis shows small bowel obstruction and on-call general surgeon Dr. Harlow Asa was consulted who requested NG tube placement and will be seeing patient in consult.  While in the ER patient went into A. fib with RVR and heart rate improved with 1 dose of IV Cardizem.  Labs show hemoglobin of 14.8 with macrocytic picture creatinine 1.3.  Covid test was negative.  Review of Systems: As per HPI, rest all negative.   Past Medical History:  Diagnosis Date  . COPD (chronic obstructive pulmonary disease) ([place])   . Hepatitis A   . Hypertension     Past Surgical History:  Procedure Laterality Date  . BACK SURGERY    . FRACTURE SURGERY    . JOINT REPLACEMENT       reports that he has been smoking. He has never used smokeless tobacco. He reports current alcohol use. He reports that he does not use drugs.  Allergies  Allergen Reactions  . Propoxyphene Nausea And Vomiting    Family History  Family history unknown: Yes    Prior to Admission medications   Medication Sig Start Date End Date Taking? Authorizing Provider  aspirin 81 MG tablet Take 81 mg by mouth every other day.    Yes [provider]  folic acid (FOLVITE) 1 MG tablet Take 1 mg by mouth daily. 04/29/19  Yes [provider]  gabapentin (NEURONTIN) 300 MG capsule Take 300 mg by mouth 2 (two) times daily. Reported on 06/03/2015   Yes [provider]  hydrochlorothiazide  (HYDRODIURIL) 25 MG tablet Take 25 mg by mouth daily. Reported on 06/03/2015   Yes [provider]  lisinopril (PRINIVIL,ZESTRIL) 10 MG tablet Take 10 mg by mouth daily. Reported on 06/03/2015   Yes [provider]  magnesium oxide (MAG-OX) 400 MG tablet Take 400 mg by mouth daily.    Yes [provider]  methotrexate 2.5 MG tablet Take 12.5 mg by mouth once a week. tuesdays 07/13/19  Yes [provider]  VENTOLIN HFA 108 (90 Base) MCG/ACT inhaler Inhale 1-2 puffs into the lungs every 4 (four) hours as needed.  05/30/19  Yes [provider]  cephALEXin (KEFLEX) 500 MG capsule Take 1 capsule (500 mg total) by mouth 3 (three) times daily. 01/15/19   Virgel Manifold, MD  chlorpheniramine-HYDROcodone [facility] PENNKINETIC ER) 10-8 MG/5ML SUER Take 5 mLs by mouth every 12 (twelve) hours as needed for cough. 03/10/17   Blanchie Dessert, MD  HYDROcodone-acetaminophen (NORCO/VICODIN) 5-325 MG tablet Take 1 tablet by mouth every 6 (six) hours as needed. 01/15/19   Virgel Manifold, MD  levofloxacin (LEVAQUIN) 500 MG tablet Take 1 tablet (500 mg total) by mouth daily. 03/10/17   Blanchie Dessert, MD  predniSONE (DELTASONE) 20 MG tablet Take 2 tablets (40 mg total) by mouth daily. 03/10/17   Blanchie Dessert, MD  sildenafil (REVATIO) 20 MG tablet Reported on 06/03/2015 04/30/15   [provider]    Physical Exam:  Constitutional: Moderately built and nourished. Vitals:   07/22/19 2215 07/22/19 2245 07/22/19 2330 07/23/19 0000  BP: 101/60 116/70 133/73 135/86  Pulse: 75 92 99 81  Resp: 17 16 18 17   Temp:    (!) 97.3 F (36.3 C)  TempSrc:    Oral  SpO2: 92% 93% 95% 96%  Weight:   92.2 kg   Height:   6\' 1"  (1.854 m)    Eyes: Anicteric no pallor. ENMT: No discharge from the ears eyes nose or mouth. Neck: No mass felt.  No neck rigidity. Respiratory: No rhonchi or crepitations. Cardiovascular: S1-S2 heard. Abdomen: Soft mildly distended no guarding or  rigidity.  Bowel sounds are appreciated. Musculoskeletal: No edema. Skin: No rash. Neurologic: Alert awake oriented time place and person.  Moves all extremities. Psychiatric: Appears normal.  Normal affect.   Labs on Admission: I have personally reviewed following labs and imaging studies  CBC: Recent Labs  Lab 07/22/19 1854  WBC 8.0  NEUTROABS 6.4  HGB 14.8  HCT 43.5  MCV 104.1*  PLT 563   Basic Metabolic Panel: Recent Labs  Lab 07/22/19 1854  NA 140  K 3.5  CL 91*  CO2 32  GLUCOSE 143*  BUN 36*  CREATININE 1.37*  CALCIUM 9.1   GFR: Estimated Creatinine Clearance: 52.7 mL/min (A) (by C-G formula based on SCr of 1.37 mg/dL (H)). Liver Function Tests: Recent Labs  Lab 07/22/19 1854  AST 21  ALT 18  ALKPHOS 68  BILITOT 2.1*  PROT 7.4  ALBUMIN 4.4   Recent Labs  Lab 07/22/19 1854  LIPASE 19   No results for input(s): AMMONIA in the last 168 hours. Coagulation Profile: No results for input(s): INR, PROTIME in the last 168 hours. Cardiac Enzymes: No results for input(s): CKTOTAL, CKMB, CKMBINDEX, TROPONINI in the last 168 hours. BNP (last 3 results) No results for input(s): PROBNP in the last 8760 hours. HbA1C: No results for input(s): HGBA1C in the last 72 hours. CBG: No results for input(s): GLUCAP in the last 168 hours. Lipid Profile: No results for input(s): CHOL, HDL, LDLCALC, TRIG, CHOLHDL, LDLDIRECT in the last 72 hours. Thyroid Function Tests: No results for input(s): TSH, T4TOTAL, FREET4, T3FREE, THYROIDAB in the last 72 hours. Anemia Panel: No results for input(s): VITAMINB12, FOLATE, FERRITIN, TIBC, IRON, RETICCTPCT in the last 72 hours. Urine analysis:    Component Value Date/Time   COLORURINE YELLOW 07/22/2019 [place] 07/22/2019 1854   LABSPEC 1.020 07/22/2019 1854   PHURINE 5.0 07/22/2019 1854   GLUCOSEU NEGATIVE 07/22/2019 1854   HGBUR NEGATIVE 07/22/2019 1854   BILIRUBINUR SMALL (A) 07/22/2019 1854   KETONESUR  NEGATIVE 07/22/2019 1854   PROTEINUR NEGATIVE 07/22/2019 1854   NITRITE NEGATIVE 07/22/2019 1854   LEUKOCYTESUR NEGATIVE 07/22/2019 1854   Sepsis Labs: @LABRCNTIP (procalcitonin:4,lacticidven:4) ) Recent Results (from the past 240 hour(s))  SARS Coronavirus 2 by RT PCR (hospital order, performed in [place] hospital lab) Nasopharyngeal Nasopharyngeal Swab     Status: None   Collection Time: 07/22/19  6:54 PM   Specimen: Nasopharyngeal Swab  Result Value Ref Range Status   SARS Coronavirus 2 NEGATIVE NEGATIVE Final    Comment: (NOTE) SARS-CoV-2 target nucleic acids are NOT DETECTED. The SARS-CoV-2 RNA is generally detectable in upper and lower respiratory specimens during the acute phase of infection. The lowest concentration of SARS-CoV-2 viral copies this assay can detect is 250 copies / mL. A negative result does not preclude SARS-CoV-2 infection and should not be used  as the sole basis for treatment or other patient management decisions.  A negative result may occur with improper specimen collection / handling, submission of specimen other than nasopharyngeal swab, presence of viral mutation(s) within the areas targeted by this assay, and inadequate number of viral copies (<250 copies / mL). A negative result must be combined with clinical observations, patient history, and epidemiological information. Fact Sheet for Patients:   StrictlyIdeas.no Fact Sheet for Healthcare Providers: BankingDealers.co.za This test is not yet approved or cleared  by the [place] FDA and has been authorized for detection and/or diagnosis of SARS-CoV-2 by FDA under an Emergency Use Authorization (EUA).  This EUA will remain in effect (meaning this test can be used) for the duration of the COVID-19 declaration under Section 564(b)(1) of the Act, 21 U.S.C. section 360bbb-3(b)(1), unless the authorization is terminated or revoked sooner. Performed  at [facility], [address] [place], [place] [address]   MRSA PCR Screening     Status: None   Collection Time: 07/22/19 11:25 PM   Specimen: Nasal Mucosa; Nasopharyngeal  Result Value Ref Range Status   MRSA by PCR NEGATIVE NEGATIVE Final    Comment:        The GeneXpert MRSA Assay (FDA approved for NASAL specimens only), is one component of a comprehensive MRSA colonization surveillance program. It is not intended to diagnose MRSA infection nor to guide or monitor treatment for MRSA infections. Performed at [facility], [place] [address], [place],  [address]      Radiological Exams on Admission: DG Abdomen 1 View  Result Date: 07/22/2019 CLINICAL DATA:  NG tube placement EXAM: ABDOMEN - 1 VIEW COMPARISON:  None. FINDINGS: NG tube is in the fundus of the stomach. IMPRESSION: NG tube in the stomach. Electronically Signed   By: Rolm Baptise M.D.   On: 07/22/2019 21:23   CT ABDOMEN PELVIS W CONTRAST  Result Date: 07/22/2019 CLINICAL DATA:  Suspected bowel obstruction. Vomiting for 2 days. Hepatitis C. Hypertension. COPD. EXAM: CT ABDOMEN AND PELVIS WITH CONTRAST TECHNIQUE: Multidetector CT imaging of the abdomen and pelvis was performed using the standard protocol following bolus administration of intravenous contrast. CONTRAST:  187mL OMNIPAQUE IOHEXOL 300 MG/ML  SOLN COMPARISON:  12/28/2018 abdominal ultrasound from [place]. CT of 05/18/2016, report only available and reviewed. A CT from [place] of 06/24/2015 is reviewed. FINDINGS: Lower chest: Clear lung bases. Normal heart size without pericardial or pleural effusion. Right coronary artery atherosclerosis. Hepatobiliary: Mild cirrhosis, as evidenced by irregular hepatic capsule. No focal liver lesion. Normal gallbladder, without biliary ductal dilatation. Pancreas: Normal, without mass or ductal dilatation. Spleen: Normal in size, without focal abnormality.  Adrenals/Urinary Tract: Normal adrenal glands. Mild renal cortical thinning bilaterally. Interpolar left renal 1.6 cm cyst. Left renal collecting system calculi of up to 4 mm. Normal urinary bladder. Stomach/Bowel: Normal stomach, without wall thickening. Surgical changes at the rectosigmoid junction. Normal caliber of the colon. Normal terminal ileum. Moderately distended proximal and mid small bowel loops, fluid-filled. This continues to the level of a transition within the mid ileum, including on 58/2. There may be a second adjacent transition identified just lateral to this. minimal edema within subtending mesentery of dilated bowel loops, including on 62/2. Vascular/Lymphatic: Aortic and branch vessel atherosclerosis. Infrarenal aortic dilatation of maximally 3.1 cm. No abdominopelvic adenopathy. Reproductive: Normal prostate. Other: Trace fluid within the cul-de-sac, including on 74/2. No free intraperitoneal air. Musculoskeletal: Moderate compression deformity involving the  L1 vertebral body, described on 05/29/2019 radiograph. IMPRESSION: 1. Mid to distal small bowel obstruction, presumably secondary to adhesions. Possible second adjacent transition point, as can be seen with close loop obstruction. Consider surgical consultation. 2. Although there are no specific signs of complicating ischemia, there is trace pelvic fluid and edema/fluid within the subtending small-bowel mesentery. 3. Left nephrolithiasis. 4. Coronary artery atherosclerosis. Aortic Atherosclerosis (ICD10-I70.0). 5. Mild cirrhosis. Electronically Signed   By: Abigail Miyamoto M.D.   On: 07/22/2019 20:20    EKG: Independently reviewed.  A. fib with RVR.  Assessment/Plan Principal Problem:   SBO (small bowel obstruction) (HCC) Active Problems:   A-fib ([place])    1. Small bowel obstruction for which patient has been placed on NG tube suction.  N.p.o. IV fluids general surgery consult and KUB in a.m. 2. A. fib new onset -heart rate  improved with IV Cardizem.  Check 2D echo cardiac markers TSH. 3. Hypertension we will keep patient on as needed IV labetalol. 4. History of rheumatoid arthritis on methotrexate presently n.p.o. 5. Acute renal failure likely from nausea vomiting, small bowel obstruction which I think will improve with fluids.  Since patient has small bowel obstruction will need more than 2 midnight stay in inpatient status.   DVT prophylaxis: SCDs.  Holding off pharmacological DVT prophylaxis since patient may need procedure for the bowel obstruction. Code Status: Full code. Family Communication: Discussed with patient. Disposition Plan: Home. Consults called: General surgery. Admission status: Inpatient.   Rise Patience MD [facility] Pager [phone].  If 7PM-7AM, please contact night-coverage [email or web address] Password [email or web address]  07/23/2019, 1:13 AM

## 2019-07-23 NOTE — Progress Notes (Signed)
ANTICOAGULATION CONSULT NOTE - Initial Consult  Pharmacy Consult for Heparin Indication: atrial fibrillation  Allergies  Allergen Reactions  . Propoxyphene Nausea And Vomiting    Patient Measurements: Height: 6\' 1"  (185.4 cm) Weight: 92.2 kg (203 lb 4.2 oz) IBW/kg (Calculated) : 79.9 HEPARIN DW (KG): 92.2  Vital Signs: Temp: 98.5 F (36.9 C) (06/02 1200) Temp Source: Oral (06/02 1200) BP: 89/54 (06/02 1300) Pulse Rate: 97 (06/02 1300)  Labs: Recent Labs    07/22/19 1854 07/23/19 0228 07/23/19 0300  HGB 14.8 13.1  --   HCT 43.5 40.4  --   PLT 256 209  --   CREATININE 1.37*  --  1.43*  TROPONINIHS  --   --  24*    Estimated Creatinine Clearance: 50.4 mL/min (A) (by C-G formula based on SCr of 1.43 mg/dL (H)).   Medical History: Past Medical History:  Diagnosis Date  . AAA (abdominal aortic aneurysm) without rupture ([place])   . COPD (chronic obstructive pulmonary disease) ([place])   . Hepatitis A   . Hypertension   . PAF (paroxysmal atrial fibrillation) ([place])   . Rheumatoid arthritis (H[facility])     Medications:  Infusions:  . dextrose 5 % and 0.9% NaCl 100 mL/hr at 07/23/19 1441  . diltiazem (CARDIZEM) infusion      Assessment: 74 yo M with PAF (CHADS2Vasc =3).  Not currently on anticoagulation as outpatient. Previously took Xarelto but he could not afford this and was switched to aspirin.   Currently NPO for conservative management of SBO.    Baseline CBC WNL.  Baseline INR pending.    Goal of Therapy:  Heparin level 0.3-0.7 units/ml Monitor platelets by anticoagulation protocol: Yes   Plan:  Give 4000 units bolus x 1 Start heparin infusion at 1100 units/hr Check anti-Xa level in 8 hours and daily while on heparin Continue to monitor H&H and platelets  Netta Cedars PharmD, BCPS 07/23/2019,3:24 PM

## 2019-07-23 NOTE — Progress Notes (Signed)
  Echocardiogram 2D Echocardiogram has been performed.  Michiel Cowboy 07/23/2019, 3:58 PM

## 2019-07-23 NOTE — H&P (View-Only) (Signed)
[facility] Surgery Consult Note  Jake [name] 1945-07-13  [id number].    Requesting MD: Reece Levy Chief Complaint: Abdominal pain, nausea, and vomiting Reason for Consult:   HPI:  Patient is a 74 year old male with a history of hypertension, rheumatoid arthritis, COPD, ongoing tobacco use, atrial fibrillation, presented to the ED with complaints of abdominal pain and nausea and vomiting since 07/20/19.  He said multiple episodes of nausea and vomiting.  His last bowel movement was reported 3 days ago.  He had a colon resection in the 80s by Dr. Vertell Limber in [facility] for polyps.  He has had no prior history of small bowel obstruction or any problems since his surgery in the 1980s.  Work-up in the ED shows he is afebrile blood pressure stable but he was tachycardic.  His O2 saturations  were low and he was requiring nasal oxygen.  Labs shows glucose of 143, BUN of 36, creatinine 1.37, total bilirubin 2.1 the remaining LFTs are normal.  WBC 8.0, hemoglobin 14.8, hematocrit 43.5, platelets 256,000.  A CT of the abdomen with contrast was obtained.  This showed moderately distended proximal mid small bowel loops fluid-filled.  There is a transition within the mid ileum and there may be a second transition identified just lateral to this with minimal edema.  There were surgical changes of the rectosigmoid junction with normal caliber colon.  He was admitted by medicine and NG tube was placed.  No output is currently recorded.  He has had 1000 mL of IV fluid recorded.  We are asked to see. Currently this a.m. his vital signs are stable he is on a 6 L nasal cannula with O2 sats between 93 to 95%.  Heart rate remains mildly tachycardic in the 80-100 range.  Labs show a rise in his creatinine to 1.43.  The remainder of his BMP is normal.  700 recorded from the NG last evening.  Cardiology consult pending ROS: Review of Systems  Constitutional: Negative.   HENT: Negative.   Eyes: Negative.    Respiratory: Positive for cough (Usually clear, still smoking), shortness of breath (DOE with 1 block.) and wheezing.   Cardiovascular: Positive for leg swelling.  Gastrointestinal: Positive for abdominal pain, nausea and vomiting. Negative for blood in stool, constipation, diarrhea and melena.         He thinks he had a colonoscopy 2 to 3 years ago.  Genitourinary: Negative.   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  Skin: Negative.   Neurological: Negative.   Endo/Heme/Allergies: Negative.   Psychiatric/Behavioral: Negative.     Family History  Family history unknown: Yes    Past Medical History:  Diagnosis Date  . COPD (chronic obstructive pulmonary disease) ([place])   . Hepatitis A   . Hypertension     Past Surgical History:  Procedure Laterality Date  . BACK SURGERY    . FRACTURE SURGERY    . JOINT REPLACEMENT      Social History:  reports that he has been smoking. He has never used smokeless tobacco. He reports current alcohol use. He reports that he does not use drugs. EtOH: Heavy use when he was younger.  Mostly beer.  Now he has just couple beers per day. Tobacco: He started smoking at age 67.  He was up to 3 packs a day when he retired.  He currently smokes about 1/2 pack/day now. Drugs: None He is married lives with his wife. He was  a truck Geophysicist/field seismologist. He served in [place] for 1 year in the [employer].    Allergies:  Allergies  Allergen Reactions  . Propoxyphene Nausea And Vomiting    Medications Prior to Admission  Medication Sig Dispense Refill  . aspirin 81 MG tablet Take 81 mg by mouth every other day.     . folic acid (FOLVITE) 1 MG tablet Take 1 mg by mouth daily.    Marland Kitchen gabapentin (NEURONTIN) 300 MG capsule Take 300 mg by mouth 2 (two) times daily. Reported on 06/03/2015    . hydrochlorothiazide (HYDRODIURIL) 25 MG tablet Take 25 mg by mouth daily. Reported on 06/03/2015    . lisinopril (PRINIVIL,ZESTRIL) 10 MG tablet Take 10 mg by  mouth daily. Reported on 06/03/2015    . magnesium oxide (MAG-OX) 400 MG tablet Take 400 mg by mouth daily.     . methotrexate 2.5 MG tablet Take 12.5 mg by mouth once a week. tuesdays    . VENTOLIN HFA 108 (90 Base) MCG/ACT inhaler Inhale 1-2 puffs into the lungs every 4 (four) hours as needed.     . cephALEXin (KEFLEX) 500 MG capsule Take 1 capsule (500 mg total) by mouth 3 (three) times daily. 21 capsule 0  . chlorpheniramine-HYDROcodone (TUSSIONEX PENNKINETIC ER) 10-8 MG/5ML SUER Take 5 mLs by mouth every 12 (twelve) hours as needed for cough. 140 mL 0  . HYDROcodone-acetaminophen (NORCO/VICODIN) 5-325 MG tablet Take 1 tablet by mouth every 6 (six) hours as needed. 10 tablet 0  . levofloxacin (LEVAQUIN) 500 MG tablet Take 1 tablet (500 mg total) by mouth daily. 7 tablet 0  . predniSONE (DELTASONE) 20 MG tablet Take 2 tablets (40 mg total) by mouth daily. 10 tablet 0  . sildenafil (REVATIO) 20 MG tablet Reported on 06/03/2015  10    Blood pressure (!) 111/53, pulse (!) 52, temperature 98.4 F (36.9 C), temperature source Axillary, resp. rate 17, height 6\' 1"  (1.854 m), weight 92.2 kg, SpO2 95 %. Physical Exam:  General: pleasant, WD, WN white male who is laying in bed in NAD.  He has a very small NG in place draining green fluid. HEENT: head is normocephalic, atraumatic.  Sclera are noninjected.  Pupils are equal Ears and nose without any masses or lesions.  Mouth is pink and moist Heart:  Rate is irregular he is in atrial fibrillation heart rate in the 110 range.  Palpable radial and pedal pulses bilaterally Lungs: CTAB, no wheezes, rhonchi, or rales noted.  Respiratory effort nonlabored Abd: soft, he is distended he is mildly tender intermittently.  Bowel sounds are hyperactive.  No flatus or BM. MS: all 4 extremities are symmetrical with no cyanosis, clubbing, or edema. Skin: warm and dry with no masses, lesions, or rashes Neuro: Cranial nerves 2-12 grossly intact, sensation is normal  throughout Psych: A&Ox3 with an appropriate affect.   Results for orders placed or performed during the hospital encounter of 07/22/19 (from the past 48 hour(s))  CBC with Differential     Status: Abnormal   Collection Time: 07/22/19  6:54 PM  Result Value Ref Range   WBC 8.0 4.0 - 10.5 K/uL   RBC 4.18 (L) 4.22 - 5.81 MIL/uL   Hemoglobin 14.8 13.0 - 17.0 g/dL   HCT 43.5 39.0 - 52.0 %   MCV 104.1 (H) 80.0 - 100.0 fL   MCH 35.4 (H) 26.0 - 34.0 pg   MCHC 34.0 30.0 - 36.0 g/dL   RDW 14.5 11.5 - 15.5 %   Platelets  256 150 - 400 K/uL   nRBC 0.0 0.0 - 0.2 %   Neutrophils Relative % 80 %   Neutro Abs 6.4 1.7 - 7.7 K/uL   Lymphocytes Relative 8 %   Lymphs Abs 0.7 0.7 - 4.0 K/uL   Monocytes Relative 11 %   Monocytes Absolute 0.9 0.1 - 1.0 K/uL   Eosinophils Relative 0 %   Eosinophils Absolute 0.0 0.0 - 0.5 K/uL   Basophils Relative 0 %   Basophils Absolute 0.0 0.0 - 0.1 K/uL   Immature Granulocytes 1 %   Abs Immature Granulocytes 0.04 0.00 - 0.07 K/uL    Comment: Performed at [facility], [address] [place], [place] [address]  Comprehensive metabolic panel     Status: Abnormal   Collection Time: 07/22/19  6:54 PM  Result Value Ref Range   Sodium 140 135 - 145 mmol/L   Potassium 3.5 3.5 - 5.1 mmol/L   Chloride 91 (L) 98 - 111 mmol/L   CO2 32 22 - 32 mmol/L   Glucose, Bld 143 (H) 70 - 99 mg/dL    Comment: Glucose reference range applies only to samples taken after fasting for at least 8 hours.   BUN 36 (H) 8 - 23 mg/dL   Creatinine, Ser 1.37 (H) 0.61 - 1.24 mg/dL   Calcium 9.1 8.9 - 10.3 mg/dL   Total Protein 7.4 6.5 - 8.1 g/dL   Albumin 4.4 3.5 - 5.0 g/dL   AST 21 15 - 41 U/L   ALT 18 0 - 44 U/L   Alkaline Phosphatase 68 38 - 126 U/L   Total Bilirubin 2.1 (H) 0.3 - 1.2 mg/dL   GFR calc non Af Amer 50 (L) >60 mL/min   GFR calc Af Amer 58 (L) >60 mL/min   Anion gap 17 (H) 5 - 15    Comment: Performed at [facility], [address] [place],  [place] [address]  Lipase, blood     Status: None   Collection Time: 07/22/19  6:54 PM  Result Value Ref Range   Lipase 19 11 - 51 U/L    Comment: Performed at [facility], [address] [place], [place] [address]  Urinalysis, Routine w reflex microscopic     Status: Abnormal   Collection Time: 07/22/19  6:54 PM  Result Value Ref Range   Color, Urine YELLOW YELLOW   APPearance CLEAR CLEAR   Specific Gravity, Urine 1.020 1.005 - 1.030   pH 5.0 5.0 - 8.0   Glucose, UA NEGATIVE NEGATIVE mg/dL   Hgb urine dipstick NEGATIVE NEGATIVE   Bilirubin Urine SMALL (A) NEGATIVE   Ketones, ur NEGATIVE NEGATIVE mg/dL   Protein, ur NEGATIVE NEGATIVE mg/dL   Nitrite NEGATIVE NEGATIVE   Leukocytes,Ua NEGATIVE NEGATIVE    Comment: Microscopic not done on urines with negative protein, blood, leukocytes, nitrite, or glucose < 500 mg/dL. Performed at [facility], [address] [place], [place] [address]   SARS Coronavirus 2 by RT PCR (hospital order, performed in [facility] hospital lab) Nasopharyngeal Nasopharyngeal Swab     Status: None   Collection Time: 07/22/19  6:54 PM   Specimen: Nasopharyngeal Swab  Result Value Ref Range   SARS Coronavirus 2 NEGATIVE NEGATIVE    Comment: (NOTE) SARS-CoV-2 target nucleic acids are NOT DETECTED. The SARS-CoV-2 RNA is generally detectable in upper and lower respiratory specimens during the acute phase of infection. The lowest concentration of SARS-CoV-2  viral copies this assay can detect is 250 copies / mL. A negative result does not preclude SARS-CoV-2 infection and should not be used as the sole basis for treatment or other patient management decisions.  A negative result may occur with improper specimen collection / handling, submission of specimen other than nasopharyngeal swab, presence of viral mutation(s) within the areas targeted by this assay, and inadequate number of viral copies (<250 copies / mL). A negative result must be  combined with clinical observations, patient history, and epidemiological information. Fact Sheet for Patients:   StrictlyIdeas.no Fact Sheet for Healthcare Providers: BankingDealers.co.za This test is not yet approved or cleared  by the [place] FDA and has been authorized for detection and/or diagnosis of SARS-CoV-2 by FDA under an Emergency Use Authorization (EUA).  This EUA will remain in effect (meaning this test can be used) for the duration of the COVID-19 declaration under Section 564(b)(1) of the Act, 21 U.S.C. section 360bbb-3(b)(1), unless the authorization is terminated or revoked sooner. Performed at [facility], [address] [place], [place] [address]   MRSA PCR Screening     Status: None   Collection Time: 07/22/19 11:25 PM   Specimen: Nasal Mucosa; Nasopharyngeal  Result Value Ref Range   MRSA by PCR NEGATIVE NEGATIVE    Comment:        The GeneXpert MRSA Assay (FDA approved for NASAL specimens only), is one component of a comprehensive MRSA colonization surveillance program. It is not intended to diagnose MRSA infection nor to guide or monitor treatment for MRSA infections. Performed at [facility], [place] [address] [place], [place] [address]   CBC     Status: Abnormal   Collection Time: 07/23/19  2:28 AM  Result Value Ref Range   WBC 6.8 4.0 - 10.5 K/uL   RBC 3.69 (L) 4.22 - 5.81 MIL/uL   Hemoglobin 13.1 13.0 - 17.0 g/dL   HCT 40.4 39.0 - 52.0 %   MCV 109.5 (H) 80.0 - 100.0 fL   MCH 35.5 (H) 26.0 - 34.0 pg   MCHC 32.4 30.0 - 36.0 g/dL   RDW 14.5 11.5 - 15.5 %   Platelets 209 150 - 400 K/uL   nRBC 0.0 0.0 - 0.2 %    Comment: Performed at [facility], [place] [address] [place], [place] [address]  Basic metabolic panel     Status: Abnormal   Collection Time: 07/23/19  3:00 AM  Result Value Ref Range   Sodium 143 135 - 145 mmol/L   Potassium 4.7 3.5 -  5.1 mmol/L   Chloride 93 (L) 98 - 111 mmol/L   CO2 33 (H) 22 - 32 mmol/L   Glucose, Bld 110 (H) 70 - 99 mg/dL    Comment: Glucose reference range applies only to samples taken after fasting for at least 8 hours.   BUN 38 (H) 8 - 23 mg/dL   Creatinine, Ser 1.43 (H) 0.61 - 1.24 mg/dL   Calcium 8.8 (L) 8.9 - 10.3 mg/dL   GFR calc non Af Amer 48 (L) >60 mL/min   GFR calc Af Amer 55 (L) >60 mL/min   Anion gap 17 (H) 5 - 15    Comment: Performed at [facility], [place] [address], [place], [place] [address]  TSH     Status: None   Collection Time: 07/23/19  3:00 AM  Result Value Ref Range   TSH 1.224 0.350 - 4.500 uIU/mL  Comment: Performed by a 3rd Generation assay with a functional sensitivity of <=0.01 uIU/mL. Performed at [facility], [place] [address], [place], [place] [address]   Magnesium     Status: None   Collection Time: 07/23/19  3:00 AM  Result Value Ref Range   Magnesium 1.9 1.7 - 2.4 mg/dL    Comment: Performed at [facility], [place] [address] [place], [place] [address]  Troponin I (High Sensitivity)     Status: Abnormal   Collection Time: 07/23/19  3:00 AM  Result Value Ref Range   Troponin I (High Sensitivity) 24 (H) <18 ng/L    Comment: (NOTE) Elevated high sensitivity troponin I (hsTnI) values and significant  changes across serial measurements may suggest ACS but many other  chronic and acute conditions are known to elevate hsTnI results.  Refer to the "Links" section for chest pain algorithms and additional  guidance. Performed at [facility], [place] [address], [place], [place] [address]    DG Abdomen 1 View  Result Date: 07/22/2019 CLINICAL DATA:  NG tube placement EXAM: ABDOMEN - 1 VIEW COMPARISON:  None. FINDINGS: NG tube is in the fundus of the stomach. IMPRESSION: NG tube in the stomach. Electronically Signed   By: Rolm Baptise M.D.   On: 07/22/2019 21:23   CT ABDOMEN PELVIS W  CONTRAST  Result Date: 07/22/2019 CLINICAL DATA:  Suspected bowel obstruction. Vomiting for 2 days. Hepatitis C. Hypertension. COPD. EXAM: CT ABDOMEN AND PELVIS WITH CONTRAST TECHNIQUE: Multidetector CT imaging of the abdomen and pelvis was performed using the standard protocol following bolus administration of intravenous contrast. CONTRAST:  191mL OMNIPAQUE IOHEXOL 300 MG/ML  SOLN COMPARISON:  12/28/2018 abdominal ultrasound from [place]. CT of 05/18/2016, report only available and reviewed. A CT from [place] of 06/24/2015 is reviewed. FINDINGS: Lower chest: Clear lung bases. Normal heart size without pericardial or pleural effusion. Right coronary artery atherosclerosis. Hepatobiliary: Mild cirrhosis, as evidenced by irregular hepatic capsule. No focal liver lesion. Normal gallbladder, without biliary ductal dilatation. Pancreas: Normal, without mass or ductal dilatation. Spleen: Normal in size, without focal abnormality. Adrenals/Urinary Tract: Normal adrenal glands. Mild renal cortical thinning bilaterally. Interpolar left renal 1.6 cm cyst. Left renal collecting system calculi of up to 4 mm. Normal urinary bladder. Stomach/Bowel: Normal stomach, without wall thickening. Surgical changes at the rectosigmoid junction. Normal caliber of the colon. Normal terminal ileum. Moderately distended proximal and mid small bowel loops, fluid-filled. This continues to the level of a transition within the mid ileum, including on 58/2. There may be a second adjacent transition identified just lateral to this. minimal edema within subtending mesentery of dilated bowel loops, including on 62/2. Vascular/Lymphatic: Aortic and branch vessel atherosclerosis. Infrarenal aortic dilatation of maximally 3.1 cm. No abdominopelvic adenopathy. Reproductive: Normal prostate. Other: Trace fluid within the cul-de-sac, including on 74/2. No free intraperitoneal air. Musculoskeletal: Moderate compression deformity  involving the L1 vertebral body, described on 05/29/2019 radiograph. IMPRESSION: 1. Mid to distal small bowel obstruction, presumably secondary to adhesions. Possible second adjacent transition point, as can be seen with close loop obstruction. Consider surgical consultation. 2. Although there are no specific signs of complicating ischemia, there is trace pelvic fluid and edema/fluid within the subtending small-bowel mesentery. 3. Left nephrolithiasis. 4. Coronary artery atherosclerosis. Aortic Atherosclerosis (ICD10-I70.0). 5. Mild cirrhosis. Electronically Signed   By: Abigail Miyamoto M.D.   On: 07/22/2019 20:20   . dextrose 5 % and 0.9% NaCl 100 mL/hr at  07/23/19 0434     Assessment/Plan COPD/100+ pack year history -currently @ 1/2 PPD Rheumatoid arthritis Atrial fibrillation   Small bowel obstruction with history of colon resection in 1980s. Dehydration/acute kidney disease - creatinine 1.37>>1.43  FEN: N.p.o./IV fluids ID: None DVT: SCDs only; he can have DVT prophylaxis anticoagulation from our standpoint Follow-up: TBD  Plan: He has a very small NG in place but it is draining, fortunately this is clear green gastric fluid.  I will place him on the small bowel protocol.  This is his first episode of small bowel obstruction since his colon resection in the 1980s.  Hopefully NG decompression, bowel rest, and IV hydration will resolve the issue.  If conservative measures do not work he would require surgery.  We will follow with you.  Earnstine Regal [facility] Surgery 07/23/2019, 6:43 AM Please see Amion for pager number during day hours 7:00am-4:30pm

## 2019-07-23 NOTE — Consult Note (Signed)
[facility] Surgery Consult Note  Jake [name] 19-Apr-1945  [id number].    Requesting MD: Reece Levy Chief Complaint: Abdominal pain, nausea, and vomiting Reason for Consult:   HPI:  Patient is a 74 year old male with a history of hypertension, rheumatoid arthritis, COPD, ongoing tobacco use, atrial fibrillation, presented to the ED with complaints of abdominal pain and nausea and vomiting since 07/20/19.  He said multiple episodes of nausea and vomiting.  His last bowel movement was reported 3 days ago.  He had a colon resection in the 80s by Dr. Vertell Limber in [facility] for polyps.  He has had no prior history of small bowel obstruction or any problems since his surgery in the 1980s.  Work-up in the ED shows he is afebrile blood pressure stable but he was tachycardic.  His O2 saturations  were low and he was requiring nasal oxygen.  Labs shows glucose of 143, BUN of 36, creatinine 1.37, total bilirubin 2.1 the remaining LFTs are normal.  WBC 8.0, hemoglobin 14.8, hematocrit 43.5, platelets 256,000.  A CT of the abdomen with contrast was obtained.  This showed moderately distended proximal mid small bowel loops fluid-filled.  There is a transition within the mid ileum and there may be a second transition identified just lateral to this with minimal edema.  There were surgical changes of the rectosigmoid junction with normal caliber colon.  He was admitted by medicine and NG tube was placed.  No output is currently recorded.  He has had 1000 mL of IV fluid recorded.  We are asked to see. Currently this a.m. his vital signs are stable he is on a 6 L nasal cannula with O2 sats between 93 to 95%.  Heart rate remains mildly tachycardic in the 80-100 range.  Labs show a rise in his creatinine to 1.43.  The remainder of his BMP is normal.  700 recorded from the NG last evening.  Cardiology consult pending ROS: Review of Systems  Constitutional: Negative.   HENT: Negative.   Eyes: Negative.    Respiratory: Positive for cough (Usually clear, still smoking), shortness of breath (DOE with 1 block.) and wheezing.   Cardiovascular: Positive for leg swelling.  Gastrointestinal: Positive for abdominal pain, nausea and vomiting. Negative for blood in stool, constipation, diarrhea and melena.         He thinks he had a colonoscopy 2 to 3 years ago.  Genitourinary: Negative.   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  Skin: Negative.   Neurological: Negative.   Endo/Heme/Allergies: Negative.   Psychiatric/Behavioral: Negative.     Family History  Family history unknown: Yes    Past Medical History:  Diagnosis Date  . COPD (chronic obstructive pulmonary disease) ([place])   . Hepatitis A   . Hypertension     Past Surgical History:  Procedure Laterality Date  . BACK SURGERY    . FRACTURE SURGERY    . JOINT REPLACEMENT      Social History:  reports that he has been smoking. He has never used smokeless tobacco. He reports current alcohol use. He reports that he does not use drugs. EtOH: Heavy use when he was younger.  Mostly beer.  Now he has just couple beers per day. Tobacco: He started smoking at age 21.  He was up to 3 packs a day when he retired.  He currently smokes about 1/2 pack/day now. Drugs: None He is married lives with his wife. He was  a truck Geophysicist/field seismologist. He served in [place] for 1 year in the [employer].    Allergies:  Allergies  Allergen Reactions  . Propoxyphene Nausea And Vomiting    Medications Prior to Admission  Medication Sig Dispense Refill  . aspirin 81 MG tablet Take 81 mg by mouth every other day.     . folic acid (FOLVITE) 1 MG tablet Take 1 mg by mouth daily.    Marland Kitchen gabapentin (NEURONTIN) 300 MG capsule Take 300 mg by mouth 2 (two) times daily. Reported on 06/03/2015    . hydrochlorothiazide (HYDRODIURIL) 25 MG tablet Take 25 mg by mouth daily. Reported on 06/03/2015    . lisinopril (PRINIVIL,ZESTRIL) 10 MG tablet Take 10 mg by  mouth daily. Reported on 06/03/2015    . magnesium oxide (MAG-OX) 400 MG tablet Take 400 mg by mouth daily.     . methotrexate 2.5 MG tablet Take 12.5 mg by mouth once a week. tuesdays    . VENTOLIN HFA 108 (90 Base) MCG/ACT inhaler Inhale 1-2 puffs into the lungs every 4 (four) hours as needed.     . cephALEXin (KEFLEX) 500 MG capsule Take 1 capsule (500 mg total) by mouth 3 (three) times daily. 21 capsule 0  . chlorpheniramine-HYDROcodone (TUSSIONEX PENNKINETIC ER) 10-8 MG/5ML SUER Take 5 mLs by mouth every 12 (twelve) hours as needed for cough. 140 mL 0  . HYDROcodone-acetaminophen (NORCO/VICODIN) 5-325 MG tablet Take 1 tablet by mouth every 6 (six) hours as needed. 10 tablet 0  . levofloxacin (LEVAQUIN) 500 MG tablet Take 1 tablet (500 mg total) by mouth daily. 7 tablet 0  . predniSONE (DELTASONE) 20 MG tablet Take 2 tablets (40 mg total) by mouth daily. 10 tablet 0  . sildenafil (REVATIO) 20 MG tablet Reported on 06/03/2015  10    Blood pressure (!) 111/53, pulse (!) 52, temperature 98.4 F (36.9 C), temperature source Axillary, resp. rate 17, height 6\' 1"  (1.854 m), weight 92.2 kg, SpO2 95 %. Physical Exam:  General: pleasant, WD, WN white male who is laying in bed in NAD.  He has a very small NG in place draining green fluid. HEENT: head is normocephalic, atraumatic.  Sclera are noninjected.  Pupils are equal Ears and nose without any masses or lesions.  Mouth is pink and moist Heart:  Rate is irregular he is in atrial fibrillation heart rate in the 110 range.  Palpable radial and pedal pulses bilaterally Lungs: CTAB, no wheezes, rhonchi, or rales noted.  Respiratory effort nonlabored Abd: soft, he is distended he is mildly tender intermittently.  Bowel sounds are hyperactive.  No flatus or BM. MS: all 4 extremities are symmetrical with no cyanosis, clubbing, or edema. Skin: warm and dry with no masses, lesions, or rashes Neuro: Cranial nerves 2-12 grossly intact, sensation is normal  throughout Psych: A&Ox3 with an appropriate affect.   Results for orders placed or performed during the hospital encounter of 07/22/19 (from the past 48 hour(s))  CBC with Differential     Status: Abnormal   Collection Time: 07/22/19  6:54 PM  Result Value Ref Range   WBC 8.0 4.0 - 10.5 K/uL   RBC 4.18 (L) 4.22 - 5.81 MIL/uL   Hemoglobin 14.8 13.0 - 17.0 g/dL   HCT 43.5 39.0 - 52.0 %   MCV 104.1 (H) 80.0 - 100.0 fL   MCH 35.4 (H) 26.0 - 34.0 pg   MCHC 34.0 30.0 - 36.0 g/dL   RDW 14.5 11.5 - 15.5 %   Platelets  256 150 - 400 K/uL   nRBC 0.0 0.0 - 0.2 %   Neutrophils Relative % 80 %   Neutro Abs 6.4 1.7 - 7.7 K/uL   Lymphocytes Relative 8 %   Lymphs Abs 0.7 0.7 - 4.0 K/uL   Monocytes Relative 11 %   Monocytes Absolute 0.9 0.1 - 1.0 K/uL   Eosinophils Relative 0 %   Eosinophils Absolute 0.0 0.0 - 0.5 K/uL   Basophils Relative 0 %   Basophils Absolute 0.0 0.0 - 0.1 K/uL   Immature Granulocytes 1 %   Abs Immature Granulocytes 0.04 0.00 - 0.07 K/uL    Comment: Performed at [facility], [address] [place], [place] [address]  Comprehensive metabolic panel     Status: Abnormal   Collection Time: 07/22/19  6:54 PM  Result Value Ref Range   Sodium 140 135 - 145 mmol/L   Potassium 3.5 3.5 - 5.1 mmol/L   Chloride 91 (L) 98 - 111 mmol/L   CO2 32 22 - 32 mmol/L   Glucose, Bld 143 (H) 70 - 99 mg/dL    Comment: Glucose reference range applies only to samples taken after fasting for at least 8 hours.   BUN 36 (H) 8 - 23 mg/dL   Creatinine, Ser 1.37 (H) 0.61 - 1.24 mg/dL   Calcium 9.1 8.9 - 10.3 mg/dL   Total Protein 7.4 6.5 - 8.1 g/dL   Albumin 4.4 3.5 - 5.0 g/dL   AST 21 15 - 41 U/L   ALT 18 0 - 44 U/L   Alkaline Phosphatase 68 38 - 126 U/L   Total Bilirubin 2.1 (H) 0.3 - 1.2 mg/dL   GFR calc non Af Amer 50 (L) >60 mL/min   GFR calc Af Amer 58 (L) >60 mL/min   Anion gap 17 (H) 5 - 15    Comment: Performed at [facility], [address] [name],  [place] [address]  Lipase, blood     Status: None   Collection Time: 07/22/19  6:54 PM  Result Value Ref Range   Lipase 19 11 - 51 U/L    Comment: Performed at [facility], [address] [place], [place] [address]  Urinalysis, Routine w reflex microscopic     Status: Abnormal   Collection Time: 07/22/19  6:54 PM  Result Value Ref Range   Color, Urine YELLOW YELLOW   APPearance CLEAR CLEAR   Specific Gravity, Urine 1.020 1.005 - 1.030   pH 5.0 5.0 - 8.0   Glucose, UA NEGATIVE NEGATIVE mg/dL   Hgb urine dipstick NEGATIVE NEGATIVE   Bilirubin Urine SMALL (A) NEGATIVE   Ketones, ur NEGATIVE NEGATIVE mg/dL   Protein, ur NEGATIVE NEGATIVE mg/dL   Nitrite NEGATIVE NEGATIVE   Leukocytes,Ua NEGATIVE NEGATIVE    Comment: Microscopic not done on urines with negative protein, blood, leukocytes, nitrite, or glucose < 500 mg/dL. Performed at [facility], [address] [place], [place] [address]   SARS Coronavirus 2 by RT PCR (hospital order, performed in [facility] hospital lab) Nasopharyngeal Nasopharyngeal Swab     Status: None   Collection Time: 07/22/19  6:54 PM   Specimen: Nasopharyngeal Swab  Result Value Ref Range   SARS Coronavirus 2 NEGATIVE NEGATIVE    Comment: (NOTE) SARS-CoV-2 target nucleic acids are NOT DETECTED. The SARS-CoV-2 RNA is generally detectable in upper and lower respiratory specimens during the acute phase of infection. The lowest concentration of SARS-CoV-2  viral copies this assay can detect is 250 copies / mL. A negative result does not preclude SARS-CoV-2 infection and should not be used as the sole basis for treatment or other patient management decisions.  A negative result may occur with improper specimen collection / handling, submission of specimen other than nasopharyngeal swab, presence of viral mutation(s) within the areas targeted by this assay, and inadequate number of viral copies (<250 copies / mL). A negative result must be  combined with clinical observations, patient history, and epidemiological information. Fact Sheet for Patients:   StrictlyIdeas.no Fact Sheet for Healthcare Providers: BankingDealers.co.za This test is not yet approved or cleared  by the [place] FDA and has been authorized for detection and/or diagnosis of SARS-CoV-2 by FDA under an Emergency Use Authorization (EUA).  This EUA will remain in effect (meaning this test can be used) for the duration of the COVID-19 declaration under Section 564(b)(1) of the Act, 21 U.S.C. section 360bbb-3(b)(1), unless the authorization is terminated or revoked sooner. Performed at [facility], [address] [place], [place] [address]   MRSA PCR Screening     Status: None   Collection Time: 07/22/19 11:25 PM   Specimen: Nasal Mucosa; Nasopharyngeal  Result Value Ref Range   MRSA by PCR NEGATIVE NEGATIVE    Comment:        The GeneXpert MRSA Assay (FDA approved for NASAL specimens only), is one component of a comprehensive MRSA colonization surveillance program. It is not intended to diagnose MRSA infection nor to guide or monitor treatment for MRSA infections. Performed at [facility], [place] [address] [place], [place] [address]   CBC     Status: Abnormal   Collection Time: 07/23/19  2:28 AM  Result Value Ref Range   WBC 6.8 4.0 - 10.5 K/uL   RBC 3.69 (L) 4.22 - 5.81 MIL/uL   Hemoglobin 13.1 13.0 - 17.0 g/dL   HCT 40.4 39.0 - 52.0 %   MCV 109.5 (H) 80.0 - 100.0 fL   MCH 35.5 (H) 26.0 - 34.0 pg   MCHC 32.4 30.0 - 36.0 g/dL   RDW 14.5 11.5 - 15.5 %   Platelets 209 150 - 400 K/uL   nRBC 0.0 0.0 - 0.2 %    Comment: Performed at [facility], [place] [address] [place], [place] [address]  Basic metabolic panel     Status: Abnormal   Collection Time: 07/23/19  3:00 AM  Result Value Ref Range   Sodium 143 135 - 145 mmol/L   Potassium 4.7 3.5 -  5.1 mmol/L   Chloride 93 (L) 98 - 111 mmol/L   CO2 33 (H) 22 - 32 mmol/L   Glucose, Bld 110 (H) 70 - 99 mg/dL    Comment: Glucose reference range applies only to samples taken after fasting for at least 8 hours.   BUN 38 (H) 8 - 23 mg/dL   Creatinine, Ser 1.43 (H) 0.61 - 1.24 mg/dL   Calcium 8.8 (L) 8.9 - 10.3 mg/dL   GFR calc non Af Amer 48 (L) >60 mL/min   GFR calc Af Amer 55 (L) >60 mL/min   Anion gap 17 (H) 5 - 15    Comment: Performed at [facility], [place] [address], [place], [place] [address]  TSH     Status: None   Collection Time: 07/23/19  3:00 AM  Result Value Ref Range   TSH 1.224 0.350 - 4.500 uIU/mL  Comment: Performed by a 3rd Generation assay with a functional sensitivity of <=0.01 uIU/mL. Performed at [facility], [place] [address], [place], [place] [address]   Magnesium     Status: None   Collection Time: 07/23/19  3:00 AM  Result Value Ref Range   Magnesium 1.9 1.7 - 2.4 mg/dL    Comment: Performed at [facility], [place] [address], [place], [place] [address]  Troponin I (High Sensitivity)     Status: Abnormal   Collection Time: 07/23/19  3:00 AM  Result Value Ref Range   Troponin I (High Sensitivity) 24 (H) <18 ng/L    Comment: (NOTE) Elevated high sensitivity troponin I (hsTnI) values and significant  changes across serial measurements may suggest ACS but many other  chronic and acute conditions are known to elevate hsTnI results.  Refer to the "Links" section for chest pain algorithms and additional  guidance. Performed at [facility], [place] [address], [place], [place] [address]    DG Abdomen 1 View  Result Date: 07/22/2019 CLINICAL DATA:  NG tube placement EXAM: ABDOMEN - 1 VIEW COMPARISON:  None. FINDINGS: NG tube is in the fundus of the stomach. IMPRESSION: NG tube in the stomach. Electronically Signed   By: Rolm Baptise M.D.   On: 07/22/2019 21:23   CT ABDOMEN PELVIS W  CONTRAST  Result Date: 07/22/2019 CLINICAL DATA:  Suspected bowel obstruction. Vomiting for 2 days. Hepatitis C. Hypertension. COPD. EXAM: CT ABDOMEN AND PELVIS WITH CONTRAST TECHNIQUE: Multidetector CT imaging of the abdomen and pelvis was performed using the standard protocol following bolus administration of intravenous contrast. CONTRAST:  197mL OMNIPAQUE IOHEXOL 300 MG/ML  SOLN COMPARISON:  12/28/2018 abdominal ultrasound from [place]. CT of 05/18/2016, report only available and reviewed. A CT from [place] of 06/24/2015 is reviewed. FINDINGS: Lower chest: Clear lung bases. Normal heart size without pericardial or pleural effusion. Right coronary artery atherosclerosis. Hepatobiliary: Mild cirrhosis, as evidenced by irregular hepatic capsule. No focal liver lesion. Normal gallbladder, without biliary ductal dilatation. Pancreas: Normal, without mass or ductal dilatation. Spleen: Normal in size, without focal abnormality. Adrenals/Urinary Tract: Normal adrenal glands. Mild renal cortical thinning bilaterally. Interpolar left renal 1.6 cm cyst. Left renal collecting system calculi of up to 4 mm. Normal urinary bladder. Stomach/Bowel: Normal stomach, without wall thickening. Surgical changes at the rectosigmoid junction. Normal caliber of the colon. Normal terminal ileum. Moderately distended proximal and mid small bowel loops, fluid-filled. This continues to the level of a transition within the mid ileum, including on 58/2. There may be a second adjacent transition identified just lateral to this. minimal edema within subtending mesentery of dilated bowel loops, including on 62/2. Vascular/Lymphatic: Aortic and branch vessel atherosclerosis. Infrarenal aortic dilatation of maximally 3.1 cm. No abdominopelvic adenopathy. Reproductive: Normal prostate. Other: Trace fluid within the cul-de-sac, including on 74/2. No free intraperitoneal air. Musculoskeletal: Moderate compression deformity  involving the L1 vertebral body, described on 05/29/2019 radiograph. IMPRESSION: 1. Mid to distal small bowel obstruction, presumably secondary to adhesions. Possible second adjacent transition point, as can be seen with close loop obstruction. Consider surgical consultation. 2. Although there are no specific signs of complicating ischemia, there is trace pelvic fluid and edema/fluid within the subtending small-bowel mesentery. 3. Left nephrolithiasis. 4. Coronary artery atherosclerosis. Aortic Atherosclerosis (ICD10-I70.0). 5. Mild cirrhosis. Electronically Signed   By: Abigail Miyamoto M.D.   On: 07/22/2019 20:20   . dextrose 5 % and 0.9% NaCl 100 mL/hr at  07/23/19 0434     Assessment/Plan COPD/100+ pack year history -currently @ 1/2 PPD Rheumatoid arthritis Atrial fibrillation   Small bowel obstruction with history of colon resection in 1980s. Dehydration/acute kidney disease - creatinine 1.37>>1.43  FEN: N.p.o./IV fluids ID: None DVT: SCDs only; he can have DVT prophylaxis anticoagulation from our standpoint Follow-up: TBD  Plan: He has a very small NG in place but it is draining, fortunately this is clear green gastric fluid.  I will place him on the small bowel protocol.  This is his first episode of small bowel obstruction since his colon resection in the 1980s.  Hopefully NG decompression, bowel rest, and IV hydration will resolve the issue.  If conservative measures do not work he would require surgery.  We will follow with you.  Earnstine [facility] Surgery 07/23/2019, 6:43 AM Please see Amion for pager number during day hours 7:00am-4:30pm

## 2019-07-23 NOTE — Consult Note (Addendum)
Cardiology Consultation:   Patient ID: Jake [name]; [id number]; 1945/10/06   Admit date: 07/22/2019 Date of Consult: 07/23/2019  Primary Care Provider: Glendon Axe, MD Primary Cardiologist: No primary care provider on file. Dr Claudie Leach at [facility] (scheduled but not actually seeing) Primary Electrophysiologist:  None   Patient Profile:   Jake [name] is a 74 y.o. male with a hx of PAF not on anticoag, AAA w/out rupture (3.0 cm 12/2018), HTN, COPD, RA, hep A, who is being seen today for the evaluation of Afib RVR at the request of Dr Hal Hope.  History of Present Illness:   Mr. Jake [name] was admitted early this am w/ SBO>>NG tube placed, surgery seeing. Pt went into Afib RVR and cards asked to see. Pt also had 40 bt run NSVT.   Mr. Jake [name] is not aware of the atrial fibrillation.  He has no history of presyncope or syncope.  He does not know if he is in atrial fibrillation all the time or not.  He was in atrial fibrillation on admission.  When he was hospitalized in November 2020 for a COPD exacerbation, he was in atrial fibrillation.  At that time, he was started on Xarelto but it was noted that he would likely not be able to afford it.  Mr. Liska states that he could not afford the pill they gave him.  He is not familiar with Coumadin, and says he and his doctor agreed that he should be on aspirin which he takes a few times a week.  He has not had any chest pain.  He has chronic dyspnea on exertion, wheezes at times.  He has an inhaler that he uses about once a day.  He says he is a patient of Dr. Claudie Leach and has been seeing him for years.  However, when the office was contacted, he has canceled multiple appointments.  He has seen Danielle Dess, PA-C in the past.  His stomach is doing better today, not as much pain.  He says he was told that he had a heart attack in the past, but has never been hospitalized for chest pain or an MI.  His EF was normal by echo last year with no wall  motion abnormalities reported.   Past Medical History:  Diagnosis Date  . AAA (abdominal aortic aneurysm) without rupture ([place])   . COPD (chronic obstructive pulmonary disease) ([place])   . Hepatitis A   . Hypertension   . PAF (paroxysmal atrial fibrillation) ([place])   . Rheumatoid arthritis [facility])     Past Surgical History:  Procedure Laterality Date  . BACK SURGERY    . FRACTURE SURGERY    . JOINT REPLACEMENT       Prior to Admission medications   Medication Sig Start Date End Date Taking? Authorizing Provider  aspirin 81 MG tablet Take 81 mg by mouth every other day.    Yes [provider]  folic acid (FOLVITE) 1 MG tablet Take 1 mg by mouth daily. 04/29/19  Yes [provider]  gabapentin (NEURONTIN) 300 MG capsule Take 300 mg by mouth 2 (two) times daily. Reported on 06/03/2015   Yes [provider]  hydrochlorothiazide (HYDRODIURIL) 25 MG tablet Take 25 mg by mouth daily. Reported on 06/03/2015   Yes [provider]  lisinopril (PRINIVIL,ZESTRIL) 10 MG tablet Take 10 mg by mouth daily. Reported on 06/03/2015   Yes [provider]  magnesium oxide (MAG-OX) 400 MG tablet Take 400 mg by mouth daily.  Yes [provider]  methotrexate 2.5 MG tablet Take 12.5 mg by mouth once a week. tuesdays 07/13/19  Yes [provider]  VENTOLIN HFA 108 (90 Base) MCG/ACT inhaler Inhale 1-2 puffs into the lungs every 4 (four) hours as needed.  05/30/19  Yes [provider]  cephALEXin (KEFLEX) 500 MG capsule Take 1 capsule (500 mg total) by mouth 3 (three) times daily. 01/15/19   Virgel Manifold, MD  chlorpheniramine-HYDROcodone [facility] PENNKINETIC ER) 10-8 MG/5ML SUER Take 5 mLs by mouth every 12 (twelve) hours as needed for cough. 03/10/17   Blanchie Dessert, MD  HYDROcodone-acetaminophen (NORCO/VICODIN) 5-325 MG tablet Take 1 tablet by mouth every 6 (six) hours as needed. 01/15/19   Virgel Manifold, MD  levofloxacin (LEVAQUIN) 500 MG  tablet Take 1 tablet (500 mg total) by mouth daily. 03/10/17   Blanchie Dessert, MD  predniSONE (DELTASONE) 20 MG tablet Take 2 tablets (40 mg total) by mouth daily. 03/10/17   Blanchie Dessert, MD  sildenafil (REVATIO) 20 MG tablet Reported on 06/03/2015 04/30/15   [provider]    Inpatient Medications: Scheduled Meds: . chlorhexidine  15 mL Mouth Rinse BID  . Chlorhexidine Gluconate Cloth  6 each Topical Daily  . mouth rinse  15 mL Mouth Rinse BID   Continuous Infusions: . dextrose 5 % and 0.9% NaCl 100 mL/hr at 07/23/19 0434   PRN Meds: labetalol, ondansetron **OR** ondansetron (ZOFRAN) IV  Allergies:    Allergies  Allergen Reactions  . Propoxyphene Nausea And Vomiting    Social History:   Social History   Socioeconomic History  . Marital status: Married    Spouse name: Not on file  . Number of children: Not on file  . Years of education: Not on file  . Highest education level: Not on file  Occupational History  . Not on file  Tobacco Use  . Smoking status: Current Every Day Smoker  . Smokeless tobacco: Never Used  Substance and Sexual Activity  . Alcohol use: Yes    Alcohol/week: 0.0 standard drinks    Comment: 3 beers weekly  . Drug use: No  . Sexual activity: Not on file  Other Topics Concern  . Not on file  Social History Narrative  . Not on file   Social Determinants of Health   Financial Resource Strain:   . Difficulty of Paying Living Expenses:   Food Insecurity:   . Worried About Charity fundraiser in the Last Year:   . Arboriculturist in the Last Year:   Transportation Needs:   . Film/video editor (Medical):   Marland Kitchen Lack of Transportation (Non-Medical):   Physical Activity:   . Days of Exercise per Week:   . Minutes of Exercise per Session:   Stress:   . Feeling of Stress :   Social Connections:   . Frequency of Communication with Friends and Family:   . Frequency of Social Gatherings with Friends and Family:   . Attends  Religious Services:   . Active Member of Clubs or Organizations:   . Attends Archivist Meetings:   Marland Kitchen Marital Status:   Intimate Partner Violence:   . Fear of Current or Ex-Partner:   . Emotionally Abused:   Marland Kitchen Physically Abused:   . Sexually Abused:     Family History:   Family History  Family history: No known cardiac issues   Family Status:  Family Status  Relation Name Status  . Mother  Deceased  .  Father  Deceased    ROS:  Please see the history of present illness.  All other ROS reviewed and negative.     Physical Exam/Data:   Vitals:   07/23/19 0500 07/23/19 0600 07/23/19 0700 07/23/19 0800  BP: (!) 110/56 (!) 115/59 (!) 106/55 (!) 109/47  Pulse: 81 75 83 100  Resp: 18 17 17  (!) 21  Temp:      TempSrc:      SpO2: 93% 95% 96% 95%  Weight:      Height:        Intake/Output Summary (Last 24 hours) at 07/23/2019 0828 Last data filed at 07/23/2019 0600 Gross per 24 hour  Intake 1149.18 ml  Output 925 ml  Net 224.18 ml    Last 3 Weights 07/22/2019 07/22/2019 01/15/2019  Weight (lbs) 203 lb 4.2 oz 210 lb 220 lb  Weight (kg) 92.2 kg 95.255 kg 99.791 kg     Body mass index is 26.82 kg/m.   General:  Well nourished, well developed, male in no acute distress HEENT: normal Lymph: no adenopathy Neck: JVD -not elevated Endocrine:  No thryomegaly Vascular: No carotid bruits; 4/4 extremity pulses 2+  Cardiac:  normal S1, S2; irregular rate and rhythm; no murmur Lungs: Expiratory wheeze bilaterally, no rhonchi or rales  Abd: soft, tender, no hepatomegaly  Ext: no edema Musculoskeletal:  No deformities, BUE and BLE strength weak but equal Skin: warm and dry  Neuro:  CNs 2-12 intact, no focal abnormalities noted Psych:  Normal affect   EKG:  The EKG was personally reviewed and demonstrates: Atrial fibrillation, RVR, heart rate 104 by ECG this a.m., Q waves noted in leads III and aVF Telemetry:  Telemetry was personally reviewed and demonstrates: Atrial  fibrillation, heart rate generally approximately 100, no pauses greater than 2 seconds, 40 beat run of VT is noted but generally no PVCs   CV studies:   ECHO: 12/30/2018 Summary No previous echo report. Technically adequate study. Normal left ventricular size,wall thickness and wall motion with ejection fraction is estimated at 60-65% Normal left ventricular diastolic function Mild biatrial enlargement. Structurally normal mitral valve with mild mitral regurgitation. Tricuspid valve is structurally normal with mild tricuspid regurgitation. Mild pulmonary hypertension, estimated RVSP 43 mmHg No intra-cardiac source for emboli identified. Negative "bubble study" - no evidence for patent foramen ovale or right to left shunt. Findings Mitral Valve Structurally normal mitral valve with mild mitral regurgitation. Aortic Valve Structurally normal aortic valve with good leaflet mobility, and no regurgitation. No aortic stenosis. No aortic regurgitation. Tricuspid Valve Tricuspid valve is structurally normal with mild tricuspid regurgitation. Mild pulmonary hypertension. estimated RVSP 43 mmHg Pulmonic Valve The pulmonic valve was not well visualized No Doppler evidence of pulmonic stenosis or insufficiency. Left Atrium Mild Left atrial enlargement. Left Ventricle Normal left ventricular size,wall thickness and wall motion with ejection fraction is estimated at 60-65% Normal left ventricular diastolic function Right Atrium Mild Right atrial enlargement Right Ventricle Normal right ventricular size and function. Pericardial Effusion No evidence of pericardial effusion. Pleural Effusion No pleural effusion seen. Miscellaneous The aortic root diameter is within normal limits. The IVC is normal No intra-cardiac source for emboli identified. Negative "bubble study" - no evidence for patent foramen ovale or right to left shunt. M-Mode/2D Measurements & Calculations  LV Diastolic  Dimension: LV Systolic Dimension:  LA Dimension: 4.2 cmAO  4.69 cm         3.38 cm         Root Dimension: 3.6  cmLA  LV FS:27.93 %      LV Volume Diastolic:   Area: 18.2 cm2  LV PW Diastolic: 9.93 cm 71.6 ml  Septum Diastolic: 9.67  LV Volume Systolic: 38  cm            ml              LV EDV/LV EDV Index:              86.4 ml/38 m2LV ESV/LV  LA/Aorta: 1.17              ESV Index: 38 ml/17 m2  Ascending Aorta: 3.3 cm  LV Area Diastolic: 89.3 EF Calculated: 56.02 %  LA volume/Index: 65.4 ml  cm2                        /81O1  LV Area Systolic: 75.1  LV Length: 8.17 cm  cm2              LVOT: 2.2 cm Doppler Measurements & Calculations  MV Peak E-Wave: 80 cm/s AV Peak Velocity: 98.2   LVOT Peak Velocity: 88.7              cm/s            cm/s  MV Peak Gradient: 2.56 AV Peak Gradient: 3.86   LVOT Mean Velocity: 61.5  mmHg          mmHg            cm/s              AV Mean Velocity: 66.7   LVOT Peak Gradient: 3  MV Deceleration Time:  cm/s            mmHgLVOT Mean Gradient: 2  144 msec        AV Mean Gradient: 2 mmHg  mmHg              AV VTI: 19.4 cm      Estimated RVSP: 42.7 mmHg              AV Area (Continuity):3.33 Estimated RAP:3 mmHg  TDI E' Velocity: 10.5  cm2  cm/s              LVOT VTI: 17 cm      TR Velocity:315 cm/s                           TR Gradient:39.69 mmHg  E/E' prime 7.4   Laboratory Data:   Chemistry Recent Labs  Lab 07/22/19 1854 07/23/19 0300  NA 140 143  K 3.5 4.7  CL 91* 93*  CO2 32 33*  GLUCOSE 143* 110*  BUN 36* 38*  CREATININE 1.37* 1.43*  CALCIUM 9.1 8.8*  GFRNONAA 50* 48*  GFRAA 58* 55*  ANIONGAP 17* 17*    Lab Results  Component Value Date   ALT 18 07/22/2019    AST 21 07/22/2019   ALKPHOS 68 07/22/2019   BILITOT 2.1 (H) 07/22/2019   Hematology Recent Labs  Lab 07/22/19 1854 07/23/19 0228  WBC 8.0 6.8  RBC 4.18* 3.69*  HGB 14.8 13.1  HCT 43.5 40.4  MCV 104.1* 109.5*  MCH 35.4* 35.5*  MCHC 34.0 32.4  RDW 14.5 14.5  PLT 256 209   Cardiac Enzymes High Sensitivity Troponin:   Recent Labs  Lab 07/23/19 0300  TROPONINIHS 24*      BNPNo results for input(s): BNP,  PROBNP in the last 168 hours.  DDimer No results for input(s): DDIMER in the last 168 hours. TSH:  Lab Results  Component Value Date   TSH 1.224 07/23/2019   Lipids:No results found for: CHOL, HDL, LDLCALC, LDLDIRECT, TRIG, CHOLHDL HgbA1c:No results found for: HGBA1C Magnesium:  Magnesium  Date Value Ref Range Status  07/23/2019 1.9 1.7 - 2.4 mg/dL Final    Comment:    Performed at [facility], [place] [address] [place], [place] [address]     Radiology/Studies:  DG Abdomen 1 View  Result Date: 07/22/2019 CLINICAL DATA:  NG tube placement EXAM: ABDOMEN - 1 VIEW COMPARISON:  None. FINDINGS: NG tube is in the fundus of the stomach. IMPRESSION: NG tube in the stomach. Electronically Signed   By: Rolm Baptise M.D.   On: 07/22/2019 21:23   CT ABDOMEN PELVIS W CONTRAST  Result Date: 07/22/2019 CLINICAL DATA:  Suspected bowel obstruction. Vomiting for 2 days. Hepatitis C. Hypertension. COPD. EXAM: CT ABDOMEN AND PELVIS WITH CONTRAST TECHNIQUE: Multidetector CT imaging of the abdomen and pelvis was performed using the standard protocol following bolus administration of intravenous contrast. CONTRAST:  186mL OMNIPAQUE IOHEXOL 300 MG/ML  SOLN COMPARISON:  12/28/2018 abdominal ultrasound from [place]. CT of 05/18/2016, report only available and reviewed. A CT from [place] of 06/24/2015 is reviewed. FINDINGS: Lower chest: Clear lung bases. Normal heart size without pericardial or pleural effusion. Right coronary artery atherosclerosis.  Hepatobiliary: Mild cirrhosis, as evidenced by irregular hepatic capsule. No focal liver lesion. Normal gallbladder, without biliary ductal dilatation. Pancreas: Normal, without mass or ductal dilatation. Spleen: Normal in size, without focal abnormality. Adrenals/Urinary Tract: Normal adrenal glands. Mild renal cortical thinning bilaterally. Interpolar left renal 1.6 cm cyst. Left renal collecting system calculi of up to 4 mm. Normal urinary bladder. Stomach/Bowel: Normal stomach, without wall thickening. Surgical changes at the rectosigmoid junction. Normal caliber of the colon. Normal terminal ileum. Moderately distended proximal and mid small bowel loops, fluid-filled. This continues to the level of a transition within the mid ileum, including on 58/2. There may be a second adjacent transition identified just lateral to this. minimal edema within subtending mesentery of dilated bowel loops, including on 62/2. Vascular/Lymphatic: Aortic and branch vessel atherosclerosis. Infrarenal aortic dilatation of maximally 3.1 cm. No abdominopelvic adenopathy. Reproductive: Normal prostate. Other: Trace fluid within the cul-de-sac, including on 74/2. No free intraperitoneal air. Musculoskeletal: Moderate compression deformity involving the L1 vertebral body, described on 05/29/2019 radiograph. IMPRESSION: 1. Mid to distal small bowel obstruction, presumably secondary to adhesions. Possible second adjacent transition point, as can be seen with close loop obstruction. Consider surgical consultation. 2. Although there are no specific signs of complicating ischemia, there is trace pelvic fluid and edema/fluid within the subtending small-bowel mesentery. 3. Left nephrolithiasis. 4. Coronary artery atherosclerosis. Aortic Atherosclerosis (ICD10-I70.0). 5. Mild cirrhosis. Electronically Signed   By: Abigail Miyamoto M.D.   On: 07/22/2019 20:20    Assessment and Plan:   1.  Persistent atrial fibrillation, rapid ventricular  response -His heart rate is elevated secondary to his acute illness -Prior to admission, he was not on any rate lowering medications -In the hospital, Cardizem is being used preferentially to beta-blockers due to his COPD and ongoing wheezing -SBP 100s-120s so believe he will tolerate scheduled Cardizem -Will start cardizem gtt while NPO -[facility] has been contacted and they are faxing records over  2.  Anticoagulation: -Mr. Brammer could not afford Xarelto and he does not remember  discussing Coumadin with his PCP. -he was taking baby aspirin a few times a week. -Discuss heparin>> Coumadin with MD, but would need clearance from IM/Surgery to start it -CHA2DS2-VASc is 3 (age x 2, HTN)  3.  NSVT -He had a long run overnight, asymptomatic -Other than that run, no significant ectopy -Potassium is 4.7 this a.m. -Follow on telemetry  4.  SBO preop cardiovascular eval: -Surgery is seeing, patient says he is doing little better this a.m. - RCRI is 0 points, perioperative risk of major cardiac event is 0.4%. -However, DASI is aligned with a score of 4.5 giving him a functional capacity in METS of 3.3 -His respiratory status is the limiting factor, he may need more aggressive treatment of his COPD if he is to successfully undergo surgery  Principal Problem:   SBO (small bowel obstruction) ([place]) Active Problems:   A-fib ([place])     For questions or updates, please contact [facility] Please consult [email or web address] for contact info under Cardiology/STEMI.   Jonetta Speak, PA-C  07/23/2019 8:28 AM  Patient seen and examined.  Agree with above documentation.  Mr. Svec is a 74 year old male with a history of paroxysmal atrial fibrillation not on anticoagulation, abdominal aortic aneurysm (measuring 3.0 cm on 12/2018), hypertension, COPD, rheumatoid arthritis who is being seen today for evaluation of atrial fibrillation at the request of Dr. Hal Hope.  He presented with  abdominal pain, found to have small bowel obstruction.  General surgery following, conservative measures recommended, if does not respond would need surgery. He went into atrial fibrillation with RVR and cardiology consulted.  During hospitalization in November 2020 he was in atrial fibrillation and started on Xarelto.  States that he was unable to afford and has been off anticoagulation.  He denies any chest pain but reports chronic dyspnea on exertion.  States that can walk up a flight of stairs without stopping.  EKG shows atrial fibrillation, rate 104, no ST abnormalities, low voltage QRS.  Telemetry shows AF with rates 90-100s and 12 second episode of NSVT.  For his atrial fibrillation, will start Cardizem drip while n.p.o.  Check TTE to ensure no systolic dysfunction and good candidate for Cardizem.  CHA2DS2-VASc score is 3, would recommend anticoagulation once able to from procedural standpoint.  Can start heparin gtt for now.  For his NSVT, will check TTE to  rule out structural heart disease as above.  Would maintain potassium greater than 4 and magnesium greater than 2.  For preop evaluation, he reports functional capacity >4 METs, denies any chest pain but does report dyspnea on exertion (though suspect COPD contributing).  RCRI score 1 given intraperitoneal surgery, corresponding to 6% 30 day risk of MI/cardiac arrest/death.  TTE planned as above.  If unremarkable, no further cardiac work-up recommended.  Donato Heinz, MD

## 2019-07-24 ENCOUNTER — Inpatient Hospital Stay (HOSPITAL_COMMUNITY): Payer: Medicare Other

## 2019-07-24 DIAGNOSIS — K56609 Unspecified intestinal obstruction, unspecified as to partial versus complete obstruction: Secondary | ICD-10-CM

## 2019-07-24 LAB — GLUCOSE, CAPILLARY
Glucose-Capillary: 141 mg/dL — ABNORMAL HIGH (ref 70–99)
Glucose-Capillary: 129 mg/dL — ABNORMAL HIGH (ref 70–99)

## 2019-07-24 LAB — BASIC METABOLIC PANEL
Anion gap: 11 (ref 5–15)
BUN: 33 mg/dL — ABNORMAL HIGH (ref 8–23)
CO2: 38 mmol/L — ABNORMAL HIGH (ref 22–32)
Calcium: 8.7 mg/dL — ABNORMAL LOW (ref 8.9–10.3)
Chloride: 94 mmol/L — ABNORMAL LOW (ref 98–111)
Creatinine, Ser: 1.14 mg/dL (ref 0.61–1.24)
GFR calc Af Amer: >60 mL/min (ref >60)
GFR calc non Af Amer: >60 mL/min (ref >60)
Glucose, Bld: 122 mg/dL — ABNORMAL HIGH (ref 70–99)
Potassium: 3.6 mmol/L (ref 3.5–5.1)
Sodium: 143 mmol/L (ref 135–145)

## 2019-07-24 LAB — HEPARIN LEVEL (UNFRACTIONATED)
Heparin Unfractionated: 0.13 IU/mL — ABNORMAL LOW (ref 0.30–0.70)
Heparin Unfractionated: 0.15 IU/mL — ABNORMAL LOW (ref 0.30–0.70)
Heparin Unfractionated: 0.48 IU/mL (ref 0.30–0.70)

## 2019-07-24 LAB — CBC
HCT: 39.9 % (ref 39.0–52.0)
Hemoglobin: 13 g/dL (ref 13.0–17.0)
MCH: 35.6 pg — ABNORMAL HIGH (ref 26.0–34.0)
MCHC: 32.6 g/dL (ref 30.0–36.0)
MCV: 109.3 fL — ABNORMAL HIGH (ref 80.0–100.0)
Platelets: 214 10*3/uL (ref 150–400)
RBC: 3.65 MIL/uL — ABNORMAL LOW (ref 4.22–5.81)
RDW: 14.4 % (ref 11.5–15.5)
WBC: 4.7 10*3/uL (ref 4.0–10.5)
nRBC: 0 % (ref 0.0–0.2)

## 2019-07-24 LAB — PROTIME-INR
INR: 1.1 (ref 0.8–1.2)
Prothrombin Time: 13.7 seconds (ref 11.4–15.2)

## 2019-07-24 LAB — MAGNESIUM: Magnesium: 2 mg/dL (ref 1.7–2.4)

## 2019-07-24 MED ORDER — HEPARIN BOLUS VIA INFUSION
2000.0000 [IU] | Freq: Once | INTRAVENOUS | Status: AC
  Administered 2019-07-24: 2000 [IU] via INTRAVENOUS
  Filled 2019-07-24: qty 2000

## 2019-07-24 MED ORDER — BISACODYL 10 MG RE SUPP
10.0000 mg | Freq: Once | RECTAL | Status: AC
  Administered 2019-07-24: 10 mg via RECTAL
  Filled 2019-07-24: qty 1

## 2019-07-24 MED ORDER — HEPARIN (PORCINE) 25000 UT/250ML-% IV SOLN
1650.0000 [IU]/h | INTRAVENOUS | Status: DC
  Administered 2019-07-25: 1500 [IU]/h via INTRAVENOUS
  Filled 2019-07-24: qty 250

## 2019-07-24 NOTE — Progress Notes (Signed)
ANTICOAGULATION CONSULT NOTE - follow up  Pharmacy Consult for Heparin Indication: atrial fibrillation  Allergies  Allergen Reactions   Propoxyphene Nausea And Vomiting    Patient Measurements: Height: 6\' 1"  (185.4 cm) Weight: 92.2 kg (203 lb 4.2 oz) IBW/kg (Calculated) : 79.9 HEPARIN DW (KG): 92.2  Vital Signs: Temp: 97.6 F (36.4 C) (06/03 2000) Temp Source: Oral (06/03 2000) BP: 127/68 (06/03 2000) Pulse Rate: 103 (06/03 2000)  Labs: Recent Labs    07/22/19 1854 07/22/19 1854 07/23/19 0228 07/23/19 0300 07/24/19 0036 07/24/19 0947 07/24/19 1942  HGB 14.8   < > 13.1  --  13.0  --   --   HCT 43.5  --  40.4  --  39.9  --   --   PLT 256  --  209  --  214  --   --   LABPROT  --   --   --   --  13.7  --   --   INR  --   --   --   --  1.1  --   --   HEPARINUNFRC  --   --   --   --  0.15* 0.13* 0.48  CREATININE 1.37*  --   --  1.43* 1.14  --   --   TROPONINIHS  --   --   --  24*  --   --   --    < > = values in this interval not displayed.    Estimated Creatinine Clearance: 63.3 mL/min (by C-G formula based on SCr of 1.14 mg/dL).   Medical History: Past Medical History:  Diagnosis Date   AAA (abdominal aortic aneurysm) without rupture ([facility])    COPD (chronic obstructive pulmonary disease) ([facility])    Hepatitis A    Hypertension    PAF (paroxysmal atrial fibrillation) ([facility])    Rheumatoid arthritis ([facility])     Medications:  Infusions:   diltiazem (CARDIZEM) infusion 15 mg/hr (07/24/19 2000)   heparin 1,500 Units/hr (07/24/19 1154)    Assessment: 74 yo M with PAF (CHADS2Vasc =3).  Not currently on anticoagulation as outpatient. Previously took Xarelto but he could not afford this and was switched to aspirin. Currently NPO for conservative management of SBO.  Baseline CBC WNL.   Today, 07/24/19  Heparin level continues to be SUBtherapeutic despite increasing IV heparin rate from 1100 to 1250 units/hr early this AM.   CBC stable  No reported  bleeding  PM: heparin level now therapeutic (0.48) on 1500 units/hr.  No bleeding or complications reported.  Goal of Therapy:  Heparin level 0.3-0.7 units/ml Monitor platelets by anticoagulation protocol: Yes   Plan:   Continue IV heparin at 1500 units/hr  Recheck heparin level in 8hr to confirm therapeutic  Daily CBC and heparin level  Peggyann Juba, PharmD, BCPS Pharmacy: [phone] 07/24/2019,8:54 PM

## 2019-07-24 NOTE — Progress Notes (Signed)
ANTICOAGULATION CONSULT NOTE - follow up  Pharmacy Consult for Heparin Indication: atrial fibrillation  Allergies  Allergen Reactions  . Propoxyphene Nausea And Vomiting    Patient Measurements: Height: 6\' 1"  (185.4 cm) Weight: 92.2 kg (203 lb 4.2 oz) IBW/kg (Calculated) : 79.9 HEPARIN DW (KG): 92.2  Vital Signs: Temp: 98.6 F (37 C) (06/03 0800) Temp Source: Oral (06/03 0800) BP: 121/57 (06/03 0900) Pulse Rate: 118 (06/03 0900)  Labs: Recent Labs    07/22/19 1854 07/22/19 1854 07/23/19 0228 07/23/19 0300 07/24/19 0036 07/24/19 0947  HGB 14.8   < > 13.1  --  13.0  --   HCT 43.5  --  40.4  --  39.9  --   PLT 256  --  209  --  214  --   LABPROT  --   --   --   --  13.7  --   INR  --   --   --   --  1.1  --   HEPARINUNFRC  --   --   --   --  0.15* 0.13*  CREATININE 1.37*  --   --  1.43* 1.14  --   TROPONINIHS  --   --   --  24*  --   --    < > = values in this interval not displayed.    Estimated Creatinine Clearance: 63.3 mL/min (by C-G formula based on SCr of 1.14 mg/dL).   Medical History: Past Medical History:  Diagnosis Date  . AAA (abdominal aortic aneurysm) without rupture ([place])   . COPD (chronic obstructive pulmonary disease) ([place])   . Hepatitis A   . Hypertension   . PAF (paroxysmal atrial fibrillation) ([place])   . Rheumatoid arthritis ([place])     Medications:  Infusions:  . diltiazem (CARDIZEM) infusion 15 mg/hr (07/24/19 0740)  . heparin 1,250 Units/hr (07/24/19 1005)    Assessment: 74 yo M with PAF (CHADS2Vasc =3).  Not currently on anticoagulation as outpatient. Previously took Xarelto but he could not afford this and was switched to aspirin. Currently NPO for conservative management of SBO.  Baseline CBC WNL.   Today, 07/24/19  Heparin level continues to be SUBtherapeutic despite increasing IV heparin rate from 1100 to 1250 units/hr early this AM.   CBC stable  No reported bleeding  Goal of Therapy:  Heparin level 0.3-0.7  units/ml Monitor platelets by anticoagulation protocol: Yes   Plan:   Re-Bolus IV heparin with 2000 units then  Increase IV heparin rate from 1250 units/hr to 1500 units/hr  Recheck heparin level 6 hours after bolus/rate change  Daily CBC  Kara Mead PharmD, BCPS 07/24/2019,10:31 AM

## 2019-07-24 NOTE — Progress Notes (Signed)
[facility] Progress Note     Subjective: Patient reports sharp abdominal pains. No flatus or BM. Belching. NGT with thick green drainage.   Objective: Vital signs in last 24 hours: Temp:  [97.6 F (36.4 C)-99 F (37.2 C)] 98.6 F (37 C) (06/03 0800) Pulse Rate:  [51-160] 118 (06/03 0900) Resp:  [13-31] 22 (06/03 0900) BP: (89-155)/(48-87) 121/57 (06/03 0900) SpO2:  [92 %-100 %] 100 % (06/03 0900) Last BM Date: 07/19/19  Intake/Output from previous day: 06/02 0701 - 06/03 0700 In: 2431.7 [I.V.:2431.7] Out: 2775 [Urine:675; Emesis/NG output:2100] Intake/Output this shift: Total I/O In: 96.4 [I.V.:96.4] Out: 50 [Urine:50]  PE: General: pleasant, WD, chronically ill appearing white male who s laying in bed in NAD Heart: irregularly irregular, tachycardic.  Palpable radial and pedal pulses bilaterally Lungs: wheezing throughout bilateral lung fields Abd: distended, +BS, generalized mild ttp, NGT with thick green drainage    Lab Results:  Recent Labs    07/23/19 0228 07/24/19 0036  WBC 6.8 4.7  HGB 13.1 13.0  HCT 40.4 39.9  PLT 209 214   BMET Recent Labs    07/23/19 0300 07/24/19 0036  NA 143 143  K 4.7 3.6  CL 93* 94*  CO2 33* 38*  GLUCOSE 110* 122*  BUN 38* 33*  CREATININE 1.43* 1.14  CALCIUM 8.8* 8.7*   PT/INR Recent Labs    07/24/19 0036  LABPROT 13.7  INR 1.1   CMP     Component Value Date/Time   NA 143 07/24/2019 0036   K 3.6 07/24/2019 0036   CL 94 (L) 07/24/2019 0036   CO2 38 (H) 07/24/2019 0036   GLUCOSE 122 (H) 07/24/2019 0036   BUN 33 (H) 07/24/2019 0036   CREATININE 1.14 07/24/2019 0036   CALCIUM 8.7 (L) 07/24/2019 0036   PROT 7.4 07/22/2019 1854   ALBUMIN 4.4 07/22/2019 1854   AST 21 07/22/2019 1854   ALT 18 07/22/2019 1854   ALKPHOS 68 07/22/2019 1854   BILITOT 2.1 (H) 07/22/2019 1854   GFRNONAA >60 07/24/2019 0036   GFRAA >60 07/24/2019 0036   Lipase     Component Value Date/Time   LIPASE 19 07/22/2019  1854       Studies/Results: DG Abdomen 1 View  Result Date: 07/22/2019 CLINICAL DATA:  NG tube placement EXAM: ABDOMEN - 1 VIEW COMPARISON:  None. FINDINGS: NG tube is in the fundus of the stomach. IMPRESSION: NG tube in the stomach. Electronically Signed   By: Rolm Baptise M.D.   On: 07/22/2019 21:23   CT ABDOMEN PELVIS W CONTRAST  Result Date: 07/22/2019 CLINICAL DATA:  Suspected bowel obstruction. Vomiting for 2 days. Hepatitis C. Hypertension. COPD. EXAM: CT ABDOMEN AND PELVIS WITH CONTRAST TECHNIQUE: Multidetector CT imaging of the abdomen and pelvis was performed using the standard protocol following bolus administration of intravenous contrast. CONTRAST:  150mL OMNIPAQUE IOHEXOL 300 MG/ML  SOLN COMPARISON:  12/28/2018 abdominal ultrasound from [place]. CT of 05/18/2016, report only available and reviewed. A CT from [place] of 06/24/2015 is reviewed. FINDINGS: Lower chest: Clear lung bases. Normal heart size without pericardial or pleural effusion. Right coronary artery atherosclerosis. Hepatobiliary: Mild cirrhosis, as evidenced by irregular hepatic capsule. No focal liver lesion. Normal gallbladder, without biliary ductal dilatation. Pancreas: Normal, without mass or ductal dilatation. Spleen: Normal in size, without focal abnormality. Adrenals/Urinary Tract: Normal adrenal glands. Mild renal cortical thinning bilaterally. Interpolar left renal 1.6 cm cyst. Left renal collecting system calculi of up to 4 mm.  Normal urinary bladder. Stomach/Bowel: Normal stomach, without wall thickening. Surgical changes at the rectosigmoid junction. Normal caliber of the colon. Normal terminal ileum. Moderately distended proximal and mid small bowel loops, fluid-filled. This continues to the level of a transition within the mid ileum, including on 58/2. There may be a second adjacent transition identified just lateral to this. minimal edema within subtending mesentery of dilated bowel  loops, including on 62/2. Vascular/Lymphatic: Aortic and branch vessel atherosclerosis. Infrarenal aortic dilatation of maximally 3.1 cm. No abdominopelvic adenopathy. Reproductive: Normal prostate. Other: Trace fluid within the cul-de-sac, including on 74/2. No free intraperitoneal air. Musculoskeletal: Moderate compression deformity involving the L1 vertebral body, described on 05/29/2019 radiograph. IMPRESSION: 1. Mid to distal small bowel obstruction, presumably secondary to adhesions. Possible second adjacent transition point, as can be seen with close loop obstruction. Consider surgical consultation. 2. Although there are no specific signs of complicating ischemia, there is trace pelvic fluid and edema/fluid within the subtending small-bowel mesentery. 3. Left nephrolithiasis. 4. Coronary artery atherosclerosis. Aortic Atherosclerosis (ICD10-I70.0). 5. Mild cirrhosis. Electronically Signed   By: Abigail Miyamoto M.D.   On: 07/22/2019 20:20   DG Abd Portable 1V-Small Bowel Obstruction Protocol-initial, 8 hr delay  Result Date: 07/23/2019 CLINICAL DATA:  Check small-bowel obstruction EXAM: PORTABLE ABDOMEN - 1 VIEW COMPARISON:  Film from earlier in the same day. FINDINGS: Contrast material remains in the bladder from prior CT examination. Multiple dilated loops of small bowel are noted. Contrast from the stomach now lies predominately within the jejunum. No definitive colonic contrast is seen. Continued follow-up is recommended. IMPRESSION: Persistent small-bowel obstruction with contrast material throughout the jejunum. No definitive colonic contrast is seen at this time. Continued follow-up is recommended. Electronically Signed   By: Inez Catalina M.D.   On: 07/23/2019 20:24   DG Abd Portable 1V-Small Bowel Protocol-Position Verification  Result Date: 07/23/2019 CLINICAL DATA:  NG tube placement.  Small bowel obstruction. EXAM: PORTABLE ABDOMEN - 1 VIEW COMPARISON:  CT abdomen and pelvis 07/22/2019. FINDINGS:  NG tube is in place with sideport just proximal to the EG junction. Recommend advancement of 3-4 cm. No radio-opaque calculi or other significant radiographic abnormality are seen. IMPRESSION: NG tube sideport is at the gastroesophageal junction. Recommend advancement of 3-4 cm. Electronically Signed   By: Inge Rise M.D.   On: 07/23/2019 13:09   ECHOCARDIOGRAM COMPLETE  Result Date: 07/23/2019    ECHOCARDIOGRAM REPORT   Patient Name:   Jake [name] Date of Exam: 07/23/2019 Medical Rec #:  [id number]     Height:       73.0 in Accession #:    [id number]    Weight:       203.3 lb Date of Birth:  03-22-1945    BSA:          2.166 m Patient Age:    9 years      BP:           89/54 mmHg Patient Gender: M             HR:           105 bpm. Exam Location:  Inpatient Procedure: 2D Echo, Cardiac Doppler and Color Doppler Indications:    Atrial Fibrillation 427.31 / I48.91  History:        Patient has no prior history of Echocardiogram examinations.                 Arrythmias:Atrial Fibrillation; Risk Factors:Current Smoker.  Sonographer:    Gregary Signs  Swaim RDCS Referring Phys: [id number] RAVI PAHWANI IMPRESSIONS  1. Left ventricular ejection fraction, by estimation, is 55 to 60%. The left ventricle has normal function. The left ventricle has no regional wall motion abnormalities. Left ventricular diastolic parameters are indeterminate.  2. Right ventricular systolic function is normal. The right ventricular size is mildly enlarged. There is mildly elevated pulmonary artery systolic pressure. The estimated right ventricular systolic pressure is 81.1 mmHg.  3. The mitral valve is normal in structure. Trivial mitral valve regurgitation. No evidence of mitral stenosis.  4. The aortic valve is tricuspid. Aortic valve regurgitation is not visualized. No aortic stenosis is present.  5. The inferior vena cava is normal in size with greater than 50% respiratory variability, suggesting right atrial pressure of 3 mmHg.  6. The  patient was in atrial fibrillation. FINDINGS  Left Ventricle: Left ventricular ejection fraction, by estimation, is 55 to 60%. The left ventricle has normal function. The left ventricle has no regional wall motion abnormalities. The left ventricular internal cavity size was normal in size. There is  no left ventricular hypertrophy. Left ventricular diastolic parameters are indeterminate. Right Ventricle: The right ventricular size is mildly enlarged. No increase in right ventricular wall thickness. Right ventricular systolic function is normal. There is mildly elevated pulmonary artery systolic pressure. The tricuspid regurgitant velocity is 2.90 m/s, and with an assumed right atrial pressure of 3 mmHg, the estimated right ventricular systolic pressure is 91.4 mmHg. Left Atrium: Left atrial size was normal in size. Right Atrium: Right atrial size was normal in size. Pericardium: There is no evidence of pericardial effusion. Mitral Valve: The mitral valve is normal in structure. Trivial mitral valve regurgitation. No evidence of mitral valve stenosis. Tricuspid Valve: The tricuspid valve is normal in structure. Tricuspid valve regurgitation is trivial. Aortic Valve: The aortic valve is tricuspid. Aortic valve regurgitation is not visualized. No aortic stenosis is present. Pulmonic Valve: The pulmonic valve was normal in structure. Pulmonic valve regurgitation is not visualized. Aorta: The aortic root is normal in size and structure. Venous: The inferior vena cava is normal in size with greater than 50% respiratory variability, suggesting right atrial pressure of 3 mmHg. IAS/Shunts: No atrial level shunt detected by color flow Doppler.  LEFT VENTRICLE PLAX 2D LVIDd:         4.60 cm LVIDs:         4.20 cm LV PW:         0.90 cm LV IVS:        0.90 cm LVOT diam:     1.90 cm LV SV:         41 LV SV Index:   19 LVOT Area:     2.84 cm  LV Volumes (MOD) LV vol d, MOD A2C: 67.5 ml LV vol d, MOD A4C: 82.3 ml LV vol s, MOD  A2C: 38.7 ml LV vol s, MOD A4C: 43.3 ml LV SV MOD A2C:     28.8 ml LV SV MOD A4C:     82.3 ml LV SV MOD BP:      32.8 ml RIGHT VENTRICLE TAPSE (M-mode): 1.7 cm LEFT ATRIUM             Index       RIGHT ATRIUM           Index LA diam:        4.00 cm 1.85 cm/m  RA Area:     16.40 cm LA Vol (A2C):   28.6 ml 13.20  ml/m RA Volume:   38.90 ml  17.96 ml/m LA Vol (A4C):   39.8 ml 18.37 ml/m LA Biplane Vol: 35.2 ml 16.25 ml/m  AORTIC VALVE LVOT Vmax:   84.20 cm/s LVOT Vmean:  53.600 cm/s LVOT VTI:    0.145 m  AORTA Ao Root diam: 3.50 cm TRICUSPID VALVE TR Peak grad:   33.6 mmHg TR Vmax:        290.00 cm/s  SHUNTS Systemic VTI:  0.14 m Systemic Diam: 1.90 cm Loralie Champagne MD Electronically signed by Loralie Champagne MD Signature Date/Time: 07/23/2019/6:59:20 PM    Final     Anti-infectives: Anti-infectives (From admission, onward)   None       Assessment/Plan COPD/100+ pack year history -currently @ 1/2 PPD Rheumatoid arthritis Atrial fibrillation Dehydration/acute kidney disease - creatinine 1.37>>1.43>>1.14  Small bowel obstruction with history of colon resection in 1980s - small bowel protocol started yesterday, 8h film with contrast in small bowel - NGT with 2100cc out in 24 hrs, no bowel function  - film this AM pending - patient having sharp cramping pains - try suppository this AM - I am concerned that he may end up needing surgery if he doesn't open up soon, will discuss with MD  FEN: N.p.o./IV fluids ID: None VTE: heparin gtt Follow-up: TBD  LOS: 2 days    Norm Parcel , [facility] 07/24/2019, 9:39 AM Please see Amion for pager number during day hours 7:00am-4:30pm

## 2019-07-24 NOTE — Progress Notes (Signed)
PROGRESS NOTE    Jake [name]  ZOX:[id number] DOB: 07-06-1945 DOA: 07/22/2019 PCP: Glendon Axe, MD   Brief Narrative:   HPI: Jake [name] is a 74 y.o. male with history of hypertension rheumatoid arthritis COPD presents to the ER with complaints of abdominal pain and nausea vomiting.  Abdominal pain is generalized has been going on for last 2 days with multiple episodes of nausea vomiting.  Last bowel movement was 3 days ago.  ED Course: In the ER CT abdomen pelvis shows small bowel obstruction and on-call general surgeon Dr. Harlow Asa was consulted who requested NG tube placement and will be seeing patient in consult.  While in the ER patient went into A. fib with RVR and heart rate improved with 1 dose of IV Cardizem.  Labs show hemoglobin of 14.8 with macrocytic picture creatinine 1.3.  Covid test was negative.  Assessment & Plan:   Principal Problem:   SBO (small bowel obstruction) (HCC) Active Problems:   A-fib ([place])   1. Small bowel obstruction: Confirmed on the CT scan.  Likely mechanical.  Surgery on board.  NG tube was placed.  Patient feels slightly better.  Pain is improving but he is not passing any gas.  Lactic acid normal yesterday.  No leukocytosis.  Defer further management to general surgery.   2. A. fib new onset: Cardiology consulted.  He was started on Cardizem drip as well as heparin drip.  Rates remain under control.  Echo done.  Normal ejection fraction.  No wall motion abnormality.  Management per cardiology.  Appreciate their help.  3. Essential hypertension: Blood pressure controlled since he is on Cardizem drip.  Home dose of hydrochlorothiazide and lisinopril on hold.  Continue as needed labetalol in case it is needed.  4. History of rheumatoid arthritis on methotrexate presently n.p.o.  5. Acute renal failure: Resolved.  Renal function back to baseline.  DVT prophylaxis: Heparin drip   Code Status: Full Code  Family Communication:  None present at  bedside.  Plan of care discussed with patient in length and he verbalized understanding and agreed with it. Patient is from: Home Disposition Plan: Home once medically stable and cleared by general surgery and cardiology Barriers to discharge: Persistent A. fib and persistent small bowel obstruction  Status is: Inpatient  Remains inpatient appropriate because:IV treatments appropriate due to intensity of illness or inability to take PO   Dispo: The patient is from: Home              Anticipated d/c is to: Home              Anticipated d/c date is: 3 days              Patient currently is not medically stable to d/c.         Estimated body mass index is 26.82 kg/m as calculated from the following:   Height as of this encounter: 6\' 1"  (1.854 m).   Weight as of this encounter: 92.2 kg.      Nutritional status:               Consultants:   General surgery  Cardiology  Procedures:   None  Antimicrobials:  Anti-infectives (From admission, onward)   None         Subjective: Seen and examined.  Overall feels better.  Pain slightly improved.  Not passing gas.  No nausea.  Objective: Vitals:   07/24/19 0740 07/24/19 0753 07/24/19 0800 07/24/19 0900  BP:  127/69  137/61 (!) 121/57  Pulse: 83  99 (!) 118  Resp: (!) 24  20 (!) 22  Temp:   98.6 F (37 C)   TempSrc:   Oral   SpO2: 95% 96% 99% 100%  Weight:      Height:        Intake/Output Summary (Last 24 hours) at 07/24/2019 0940 Last data filed at 07/24/2019 0740 Gross per 24 hour  Intake 2528.16 ml  Output 2825 ml  Net -296.84 ml   Filed Weights   07/22/19 1812 07/22/19 2330  Weight: 95.3 kg 92.2 kg    Examination:  General exam: Appears calm and comfortable  Respiratory system: Clear to auscultation. Respiratory effort normal. Cardiovascular system: S1 & S2 heard, RRR. No JVD, murmurs, rubs, gallops or clicks. No pedal edema. Gastrointestinal system: Abdomen is slightly distended and  generalized tender with absent bowel sounds.  No organomegaly or masses felt.  Central nervous system: Alert and oriented. No focal neurological deficits. Extremities: Symmetric 5 x 5 power. Skin: No rashes, lesions or ulcers Psychiatry: Judgement and insight appear poor. Mood & affect flat   Data Reviewed: I have personally reviewed following labs and imaging studies  CBC: Recent Labs  Lab 07/22/19 1854 07/23/19 0228 07/24/19 0036  WBC 8.0 6.8 4.7  NEUTROABS 6.4  --   --   HGB 14.8 13.1 13.0  HCT 43.5 40.4 39.9  MCV 104.1* 109.5* 109.3*  PLT 256 209 563   Basic Metabolic Panel: Recent Labs  Lab 07/22/19 1854 07/23/19 0300 07/24/19 0036  NA 140 143 143  K 3.5 4.7 3.6  CL 91* 93* 94*  CO2 32 33* 38*  GLUCOSE 143* 110* 122*  BUN 36* 38* 33*  CREATININE 1.37* 1.43* 1.14  CALCIUM 9.1 8.8* 8.7*  MG  --  1.9 2.0   GFR: Estimated Creatinine Clearance: 63.3 mL/min (by C-G formula based on SCr of 1.14 mg/dL). Liver Function Tests: Recent Labs  Lab 07/22/19 1854  AST 21  ALT 18  ALKPHOS 68  BILITOT 2.1*  PROT 7.4  ALBUMIN 4.4   Recent Labs  Lab 07/22/19 1854  LIPASE 19   No results for input(s): AMMONIA in the last 168 hours. Coagulation Profile: Recent Labs  Lab 07/24/19 0036  INR 1.1   Cardiac Enzymes: No results for input(s): CKTOTAL, CKMB, CKMBINDEX, TROPONINI in the last 168 hours. BNP (last 3 results) No results for input(s): PROBNP in the last 8760 hours. HbA1C: No results for input(s): HGBA1C in the last 72 hours. CBG: Recent Labs  Lab 07/23/19 0831 07/23/19 1602 07/23/19 2341 07/24/19 0756  GLUCAP 115* 116* 105* 141*   Lipid Profile: No results for input(s): CHOL, HDL, LDLCALC, TRIG, CHOLHDL, LDLDIRECT in the last 72 hours. Thyroid Function Tests: Recent Labs    07/23/19 0300  TSH 1.224   Anemia Panel: No results for input(s): VITAMINB12, FOLATE, FERRITIN, TIBC, IRON, RETICCTPCT in the last 72 hours. Sepsis Labs: Recent Labs    Lab 07/23/19 0839 07/23/19 1041  LATICACIDVEN 1.2 1.2    Recent Results (from the past 240 hour(s))  SARS Coronavirus 2 by RT PCR (hospital order, performed in [facility] hospital lab) Nasopharyngeal Nasopharyngeal Swab     Status: None   Collection Time: 07/22/19  6:54 PM   Specimen: Nasopharyngeal Swab  Result Value Ref Range Status   SARS Coronavirus 2 NEGATIVE NEGATIVE Final    Comment: (NOTE) SARS-CoV-2 target nucleic acids are NOT DETECTED. The SARS-CoV-2 RNA is generally detectable in upper  and lower respiratory specimens during the acute phase of infection. The lowest concentration of SARS-CoV-2 viral copies this assay can detect is 250 copies / mL. A negative result does not preclude SARS-CoV-2 infection and should not be used as the sole basis for treatment or other patient management decisions.  A negative result may occur with improper specimen collection / handling, submission of specimen other than nasopharyngeal swab, presence of viral mutation(s) within the areas targeted by this assay, and inadequate number of viral copies (<250 copies / mL). A negative result must be combined with clinical observations, patient history, and epidemiological information. Fact Sheet for Patients:   StrictlyIdeas.no Fact Sheet for Healthcare Providers: BankingDealers.co.za This test is not yet approved or cleared  by the [place] FDA and has been authorized for detection and/or diagnosis of SARS-CoV-2 by FDA under an Emergency Use Authorization (EUA).  This EUA will remain in effect (meaning this test can be used) for the duration of the COVID-19 declaration under Section 564(b)(1) of the Act, 21 U.S.C. section 360bbb-3(b)(1), unless the authorization is terminated or revoked sooner. Performed at [facility], [address] [place], [place] [address]   MRSA PCR Screening     Status: None   Collection Time:  07/22/19 11:25 PM   Specimen: Nasal Mucosa; Nasopharyngeal  Result Value Ref Range Status   MRSA by PCR NEGATIVE NEGATIVE Final    Comment:        The GeneXpert MRSA Assay (FDA approved for NASAL specimens only), is one component of a comprehensive MRSA colonization surveillance program. It is not intended to diagnose MRSA infection nor to guide or monitor treatment for MRSA infections. Performed at [facility], [place] [address], [place], [place] [address]       Radiology Studies: DG Abdomen 1 View  Result Date: 07/22/2019 CLINICAL DATA:  NG tube placement EXAM: ABDOMEN - 1 VIEW COMPARISON:  None. FINDINGS: NG tube is in the fundus of the stomach. IMPRESSION: NG tube in the stomach. Electronically Signed   By: Rolm Baptise M.D.   On: 07/22/2019 21:23   CT ABDOMEN PELVIS W CONTRAST  Result Date: 07/22/2019 CLINICAL DATA:  Suspected bowel obstruction. Vomiting for 2 days. Hepatitis C. Hypertension. COPD. EXAM: CT ABDOMEN AND PELVIS WITH CONTRAST TECHNIQUE: Multidetector CT imaging of the abdomen and pelvis was performed using the standard protocol following bolus administration of intravenous contrast. CONTRAST:  139mL OMNIPAQUE IOHEXOL 300 MG/ML  SOLN COMPARISON:  12/28/2018 abdominal ultrasound from [place]. CT of 05/18/2016, report only available and reviewed. A CT from  of 06/24/2015 is reviewed. FINDINGS: Lower chest: Clear lung bases. Normal heart size without pericardial or pleural effusion. Right coronary artery atherosclerosis. Hepatobiliary: Mild cirrhosis, as evidenced by irregular hepatic capsule. No focal liver lesion. Normal gallbladder, without biliary ductal dilatation. Pancreas: Normal, without mass or ductal dilatation. Spleen: Normal in size, without focal abnormality. Adrenals/Urinary Tract: Normal adrenal glands. Mild renal cortical thinning bilaterally. Interpolar left renal 1.6 cm cyst. Left renal collecting system  calculi of up to 4 mm. Normal urinary bladder. Stomach/Bowel: Normal stomach, without wall thickening. Surgical changes at the rectosigmoid junction. Normal caliber of the colon. Normal terminal ileum. Moderately distended proximal and mid small bowel loops, fluid-filled. This continues to the level of a transition within the mid ileum, including on 58/2. There may be a second adjacent transition identified just lateral to this. minimal edema within subtending mesentery of dilated bowel loops, including on 62/2.  Vascular/Lymphatic: Aortic and branch vessel atherosclerosis. Infrarenal aortic dilatation of maximally 3.1 cm. No abdominopelvic adenopathy. Reproductive: Normal prostate. Other: Trace fluid within the cul-de-sac, including on 74/2. No free intraperitoneal air. Musculoskeletal: Moderate compression deformity involving the L1 vertebral body, described on 05/29/2019 radiograph. IMPRESSION: 1. Mid to distal small bowel obstruction, presumably secondary to adhesions. Possible second adjacent transition point, as can be seen with close loop obstruction. Consider surgical consultation. 2. Although there are no specific signs of complicating ischemia, there is trace pelvic fluid and edema/fluid within the subtending small-bowel mesentery. 3. Left nephrolithiasis. 4. Coronary artery atherosclerosis. Aortic Atherosclerosis (ICD10-I70.0). 5. Mild cirrhosis. Electronically Signed   By: Abigail Miyamoto M.D.   On: 07/22/2019 20:20   DG Abd Portable 1V-Small Bowel Obstruction Protocol-initial, 8 hr delay  Result Date: 07/23/2019 CLINICAL DATA:  Check small-bowel obstruction EXAM: PORTABLE ABDOMEN - 1 VIEW COMPARISON:  Film from earlier in the same day. FINDINGS: Contrast material remains in the bladder from prior CT examination. Multiple dilated loops of small bowel are noted. Contrast from the stomach now lies predominately within the jejunum. No definitive colonic contrast is seen. Continued follow-up is recommended.  IMPRESSION: Persistent small-bowel obstruction with contrast material throughout the jejunum. No definitive colonic contrast is seen at this time. Continued follow-up is recommended. Electronically Signed   By: Inez Catalina M.D.   On: 07/23/2019 20:24   DG Abd Portable 1V-Small Bowel Protocol-Position Verification  Result Date: 07/23/2019 CLINICAL DATA:  NG tube placement.  Small bowel obstruction. EXAM: PORTABLE ABDOMEN - 1 VIEW COMPARISON:  CT abdomen and pelvis 07/22/2019. FINDINGS: NG tube is in place with sideport just proximal to the EG junction. Recommend advancement of 3-4 cm. No radio-opaque calculi or other significant radiographic abnormality are seen. IMPRESSION: NG tube sideport is at the gastroesophageal junction. Recommend advancement of 3-4 cm. Electronically Signed   By: Inge Rise M.D.   On: 07/23/2019 13:09   ECHOCARDIOGRAM COMPLETE  Result Date: 07/23/2019    ECHOCARDIOGRAM REPORT   Patient Name:   Jake [name] Date of Exam: 07/23/2019 Medical Rec #:  [id number]     Height:       73.0 in Accession #:    [id number]    Weight:       203.3 lb Date of Birth:  February 25, 1945    BSA:          2.166 m Patient Age:    9 years      BP:           89/54 mmHg Patient Gender: M             HR:           105 bpm. Exam Location:  Inpatient Procedure: 2D Echo, Cardiac Doppler and Color Doppler Indications:    Atrial Fibrillation 427.31 / I48.91  History:        Patient has no prior history of Echocardiogram examinations.                 Arrythmias:Atrial Fibrillation; Risk Factors:Current Smoker.  Sonographer:    Vickie Epley RDCS Referring Phys: [id number] [place]  1. Left ventricular ejection fraction, by estimation, is 55 to 60%. The left ventricle has normal function. The left ventricle has no regional wall motion abnormalities. Left ventricular diastolic parameters are indeterminate.  2. Right ventricular systolic function is normal. The right ventricular size is mildly enlarged. There  is mildly elevated pulmonary artery systolic pressure. The estimated right ventricular systolic pressure is  36.6 mmHg.  3. The mitral valve is normal in structure. Trivial mitral valve regurgitation. No evidence of mitral stenosis.  4. The aortic valve is tricuspid. Aortic valve regurgitation is not visualized. No aortic stenosis is present.  5. The inferior vena cava is normal in size with greater than 50% respiratory variability, suggesting right atrial pressure of 3 mmHg.  6. The patient was in atrial fibrillation. FINDINGS  Left Ventricle: Left ventricular ejection fraction, by estimation, is 55 to 60%. The left ventricle has normal function. The left ventricle has no regional wall motion abnormalities. The left ventricular internal cavity size was normal in size. There is  no left ventricular hypertrophy. Left ventricular diastolic parameters are indeterminate. Right Ventricle: The right ventricular size is mildly enlarged. No increase in right ventricular wall thickness. Right ventricular systolic function is normal. There is mildly elevated pulmonary artery systolic pressure. The tricuspid regurgitant velocity is 2.90 m/s, and with an assumed right atrial pressure of 3 mmHg, the estimated right ventricular systolic pressure is 54.6 mmHg. Left Atrium: Left atrial size was normal in size. Right Atrium: Right atrial size was normal in size. Pericardium: There is no evidence of pericardial effusion. Mitral Valve: The mitral valve is normal in structure. Trivial mitral valve regurgitation. No evidence of mitral valve stenosis. Tricuspid Valve: The tricuspid valve is normal in structure. Tricuspid valve regurgitation is trivial. Aortic Valve: The aortic valve is tricuspid. Aortic valve regurgitation is not visualized. No aortic stenosis is present. Pulmonic Valve: The pulmonic valve was normal in structure. Pulmonic valve regurgitation is not visualized. Aorta: The aortic root is normal in size and structure.  Venous: The inferior vena cava is normal in size with greater than 50% respiratory variability, suggesting right atrial pressure of 3 mmHg. IAS/Shunts: No atrial level shunt detected by color flow Doppler.  LEFT VENTRICLE PLAX 2D LVIDd:         4.60 cm LVIDs:         4.20 cm LV PW:         0.90 cm LV IVS:        0.90 cm LVOT diam:     1.90 cm LV SV:         41 LV SV Index:   19 LVOT Area:     2.84 cm  LV Volumes (MOD) LV vol d, MOD A2C: 67.5 ml LV vol d, MOD A4C: 82.3 ml LV vol s, MOD A2C: 38.7 ml LV vol s, MOD A4C: 43.3 ml LV SV MOD A2C:     28.8 ml LV SV MOD A4C:     82.3 ml LV SV MOD BP:      32.8 ml RIGHT VENTRICLE TAPSE (M-mode): 1.7 cm LEFT ATRIUM             Index       RIGHT ATRIUM           Index LA diam:        4.00 cm 1.85 cm/m  RA Area:     16.40 cm LA Vol (A2C):   28.6 ml 13.20 ml/m RA Volume:   38.90 ml  17.96 ml/m LA Vol (A4C):   39.8 ml 18.37 ml/m LA Biplane Vol: 35.2 ml 16.25 ml/m  AORTIC VALVE LVOT Vmax:   84.20 cm/s LVOT Vmean:  53.600 cm/s LVOT VTI:    0.145 m  AORTA Ao Root diam: 3.50 cm TRICUSPID VALVE TR Peak grad:   33.6 mmHg TR Vmax:  290.00 cm/s  SHUNTS Systemic VTI:  0.14 m Systemic Diam: 1.90 cm Loralie Champagne MD Electronically signed by Loralie Champagne MD Signature Date/Time: 07/23/2019/6:59:20 PM    Final     Scheduled Meds: . bisacodyl  10 mg Rectal Once  . chlorhexidine  15 mL Mouth Rinse BID  . Chlorhexidine Gluconate Cloth  6 each Topical Daily  . levalbuterol  0.63 mg Nebulization TID  . mouth rinse  15 mL Mouth Rinse BID   Continuous Infusions: . diltiazem (CARDIZEM) infusion 15 mg/hr (07/24/19 0740)  . heparin 1,250 Units/hr (07/24/19 0740)     LOS: 2 days   Time spent: 30 minutes   Darliss Cheney, MD [facility]  07/24/2019, 9:40 AM   To contact the attending provider between 7A-7P or the covering provider during after hours 7P-7A, please log into the web site [email or web address].

## 2019-07-24 NOTE — Progress Notes (Signed)
ANTICOAGULATION CONSULT NOTE - Follow Up Consult  Pharmacy Consult for Heparin Indication: atrial fibrillation  Allergies  Allergen Reactions  . Propoxyphene Nausea And Vomiting    Patient Measurements: Height: 6\' 1"  (185.4 cm) Weight: 92.2 kg (203 lb 4.2 oz) IBW/kg (Calculated) : 79.9 Heparin Dosing Weight:   Vital Signs: Temp: 98 F (36.7 C) (06/03 0000) Temp Source: Axillary (06/03 0000) BP: 117/73 (06/03 0000) Pulse Rate: 100 (06/03 0000)  Labs: Recent Labs    07/22/19 1854 07/22/19 1854 07/23/19 0228 07/23/19 0300 07/24/19 0036  HGB 14.8   < > 13.1  --  13.0  HCT 43.5  --  40.4  --  39.9  PLT 256  --  209  --  214  LABPROT  --   --   --   --  13.7  INR  --   --   --   --  1.1  HEPARINUNFRC  --   --   --   --  0.15*  CREATININE 1.37*  --   --  1.43*  --   TROPONINIHS  --   --   --  24*  --    < > = values in this interval not displayed.    Estimated Creatinine Clearance: 50.4 mL/min (A) (by C-G formula based on SCr of 1.43 mg/dL (H)).   Medications:  Infusions:  . diltiazem (CARDIZEM) infusion 5 mg/hr (07/23/19 1800)  . heparin 1,100 Units/hr (07/23/19 1800)    Assessment: Patient with low heparin level.  No heparin issues per RN.  Goal of Therapy:  Heparin level 0.3-0.7 units/ml Monitor platelets by anticoagulation protocol: Yes   Plan:  Increase heparin to 1250 units/hr Recheck level at [place], [place] 07/24/2019,1:26 AM

## 2019-07-24 NOTE — Progress Notes (Signed)
Pt had quarter size  Clear, mucus appearing discharge from rectum when placed on bedpan. Pt had smear of brown and clear stool when cleaning pt. Will continue to monitor.

## 2019-07-24 NOTE — Progress Notes (Addendum)
Progress Note  Patient Name: Jake [name] Date of Encounter: 07/24/2019  Primary Cardiologist:  No primary care provider on file.  Dr Jake [name] from [facility] (assigned to him, he has seen Jake [name], Jake [name])  Subjective   No awareness of Afib, no chest pain or SOB L knee very painful, more than usual  Inpatient Medications    Scheduled Meds: . chlorhexidine  15 mL Mouth Rinse BID  . Chlorhexidine Gluconate Cloth  6 each Topical Daily  . levalbuterol  0.63 mg Nebulization TID  . mouth rinse  15 mL Mouth Rinse BID   Continuous Infusions: . diltiazem (CARDIZEM) infusion 15 mg/hr (07/24/19 0740)  . heparin 1,250 Units/hr (07/24/19 1005)   PRN Meds: HYDROmorphone (DILAUDID) injection, labetalol, ondansetron **OR** ondansetron (ZOFRAN) IV   Vital Signs    Vitals:   07/24/19 0740 07/24/19 0753 07/24/19 0800 07/24/19 0900  BP: 127/69  137/61 (!) 121/57  Pulse: 83  99 (!) 118  Resp: (!) 24  20 (!) 22  Temp:   98.6 F (37 C)   TempSrc:   Oral   SpO2: 95% 96% 99% 100%  Weight:      Height:        Intake/Output Summary (Last 24 hours) at 07/24/2019 1006 Last data filed at 07/24/2019 0740 Gross per 24 hour  Intake 2528.16 ml  Output 2575 ml  Net -46.84 ml   Filed Weights   07/22/19 1812 07/22/19 2330  Weight: 95.3 kg 92.2 kg   Last Weight  Most recent update: 07/22/2019 11:56 PM   Weight  92.2 kg (203 lb 4.2 oz)           Weight change:    Telemetry    Atrial fib, rate approx 100 - Personally Reviewed  ECG    None today - Personally Reviewed  Physical Exam   General: Well developed, elderly, ill-appearing, male  Head: Normocephalic, atraumatic.  Neck: Supple without bruits, JVD not seen elevated. Lungs:  Resp regular and unlabored, mild exp wheeze Heart: Irreg R&R, S1, S2, no S3, S4, or murmur; no rub. Abdomen: Soft, tender, slightly distended with decreased bowel sounds. Extremities: No clubbing, cyanosis, no edema. Distal pedal pulses are 2+  bilaterally. Neuro: Alert and oriented X 3. Moves all extremities spontaneously. Psych: Normal affect.  Labs    Hematology Recent Labs  Lab 07/22/19 1854 07/23/19 0228 07/24/19 0036  WBC 8.0 6.8 4.7  RBC 4.18* 3.69* 3.65*  HGB 14.8 13.1 13.0  HCT 43.5 40.4 39.9  MCV 104.1* 109.5* 109.3*  MCH 35.4* 35.5* 35.6*  MCHC 34.0 32.4 32.6  RDW 14.5 14.5 14.4  PLT 256 209 214    Chemistry Recent Labs  Lab 07/22/19 1854 07/23/19 0300 07/24/19 0036  NA 140 143 143  K 3.5 4.7 3.6  CL 91* 93* 94*  CO2 32 33* 38*  GLUCOSE 143* 110* 122*  BUN 36* 38* 33*  CREATININE 1.37* 1.43* 1.14  CALCIUM 9.1 8.8* 8.7*  PROT 7.4  --   --   ALBUMIN 4.4  --   --   AST 21  --   --   ALT 18  --   --   ALKPHOS 68  --   --   BILITOT 2.1*  --   --   GFRNONAA 50* 48* >60  GFRAA 58* 55* >60  ANIONGAP 17* 17* 11     High Sensitivity Troponin:   Recent Labs  Lab 07/23/19 0300  TROPONINIHS 24*  BNPNo results for input(s): BNP, PROBNP in the last 168 hours.   DDimer No results for input(s): DDIMER in the last 168 hours.   Radiology    DG Abdomen 1 View  Result Date: 07/22/2019 CLINICAL DATA:  NG tube placement EXAM: ABDOMEN - 1 VIEW COMPARISON:  None. FINDINGS: NG tube is in the fundus of the stomach. IMPRESSION: NG tube in the stomach. Electronically Signed   By: Rolm Baptise M.D.   On: 07/22/2019 21:23   CT ABDOMEN PELVIS W CONTRAST  Result Date: 07/22/2019 CLINICAL DATA:  Suspected bowel obstruction. Vomiting for 2 days. Hepatitis C. Hypertension. COPD. EXAM: CT ABDOMEN AND PELVIS WITH CONTRAST TECHNIQUE: Multidetector CT imaging of the abdomen and pelvis was performed using the standard protocol following bolus administration of intravenous contrast. CONTRAST:  175mL OMNIPAQUE IOHEXOL 300 MG/ML  SOLN COMPARISON:  12/28/2018 abdominal ultrasound from [place]. CT of 05/18/2016, report only available and reviewed. A CT from [place] of 06/24/2015 is reviewed.  FINDINGS: Lower chest: Clear lung bases. Normal heart size without pericardial or pleural effusion. Right coronary artery atherosclerosis. Hepatobiliary: Mild cirrhosis, as evidenced by irregular hepatic capsule. No focal liver lesion. Normal gallbladder, without biliary ductal dilatation. Pancreas: Normal, without mass or ductal dilatation. Spleen: Normal in size, without focal abnormality. Adrenals/Urinary Tract: Normal adrenal glands. Mild renal cortical thinning bilaterally. Interpolar left renal 1.6 cm cyst. Left renal collecting system calculi of up to 4 mm. Normal urinary bladder. Stomach/Bowel: Normal stomach, without wall thickening. Surgical changes at the rectosigmoid junction. Normal caliber of the colon. Normal terminal ileum. Moderately distended proximal and mid small bowel loops, fluid-filled. This continues to the level of a transition within the mid ileum, including on 58/2. There may be a second adjacent transition identified just lateral to this. minimal edema within subtending mesentery of dilated bowel loops, including on 62/2. Vascular/Lymphatic: Aortic and branch vessel atherosclerosis. Infrarenal aortic dilatation of maximally 3.1 cm. No abdominopelvic adenopathy. Reproductive: Normal prostate. Other: Trace fluid within the cul-de-sac, including on 74/2. No free intraperitoneal air. Musculoskeletal: Moderate compression deformity involving the L1 vertebral body, described on 05/29/2019 radiograph. IMPRESSION: 1. Mid to distal small bowel obstruction, presumably secondary to adhesions. Possible second adjacent transition point, as can be seen with close loop obstruction. Consider surgical consultation. 2. Although there are no specific signs of complicating ischemia, there is trace pelvic fluid and edema/fluid within the subtending small-bowel mesentery. 3. Left nephrolithiasis. 4. Coronary artery atherosclerosis. Aortic Atherosclerosis (ICD10-I70.0). 5. Mild cirrhosis. Electronically Signed    By: Jake [name] M.D.   On: 07/22/2019 20:20   DG Abd Portable 1V-Small Bowel Obstruction Protocol-initial, 8 hr delay  Result Date: 07/23/2019 CLINICAL DATA:  Check small-bowel obstruction EXAM: PORTABLE ABDOMEN - 1 VIEW COMPARISON:  Film from earlier in the same day. FINDINGS: Contrast material remains in the bladder from prior CT examination. Multiple dilated loops of small bowel are noted. Contrast from the stomach now lies predominately within the jejunum. No definitive colonic contrast is seen. Continued follow-up is recommended. IMPRESSION: Persistent small-bowel obstruction with contrast material throughout the jejunum. No definitive colonic contrast is seen at this time. Continued follow-up is recommended. Electronically Signed   By: Inez Catalina M.D.   On: 07/23/2019 20:24   DG Abd Portable 1V-Small Bowel Protocol-Position Verification  Result Date: 07/23/2019 CLINICAL DATA:  NG tube placement.  Small bowel obstruction. EXAM: PORTABLE ABDOMEN - 1 VIEW COMPARISON:  CT abdomen and pelvis 07/22/2019. FINDINGS: NG tube is in place with  sideport just proximal to the EG junction. Recommend advancement of 3-4 cm. No radio-opaque calculi or other significant radiographic abnormality are seen. IMPRESSION: NG tube sideport is at the gastroesophageal junction. Recommend advancement of 3-4 cm. Electronically Signed   By: Inge Rise M.D.   On: 07/23/2019 13:09   ECHOCARDIOGRAM COMPLETE  Result Date: 07/23/2019    ECHOCARDIOGRAM REPORT   Patient Name:   Jake [name] Date of Exam: 07/23/2019 Medical Rec #:  [id number]     Height:       73.0 in Accession #:    [id number]    Weight:       203.3 lb Date of Birth:  May 22, 1945    BSA:          2.166 m Patient Age:    74 years      BP:           89/54 mmHg Patient Gender: M             HR:           105 bpm. Exam Location:  Inpatient Procedure: 2D Echo, Cardiac Doppler and Color Doppler Indications:    Atrial Fibrillation 427.31 / I48.91  History:        Patient  has no prior history of Echocardiogram examinations.                 Arrythmias:Atrial Fibrillation; Risk Factors:Current Smoker.  Sonographer:    Vickie Epley RDCS Referring Phys: [id number] RAVI Gardner  1. Left ventricular ejection fraction, by estimation, is 55 to 60%. The left ventricle has normal function. The left ventricle has no regional wall motion abnormalities. Left ventricular diastolic parameters are indeterminate.  2. Right ventricular systolic function is normal. The right ventricular size is mildly enlarged. There is mildly elevated pulmonary artery systolic pressure. The estimated right ventricular systolic pressure is 30.8 mmHg.  3. The mitral valve is normal in structure. Trivial mitral valve regurgitation. No evidence of mitral stenosis.  4. The aortic valve is tricuspid. Aortic valve regurgitation is not visualized. No aortic stenosis is present.  5. The inferior vena cava is normal in size with greater than 50% respiratory variability, suggesting right atrial pressure of 3 mmHg.  6. The patient was in atrial fibrillation. FINDINGS  Left Ventricle: Left ventricular ejection fraction, by estimation, is 55 to 60%. The left ventricle has normal function. The left ventricle has no regional wall motion abnormalities. The left ventricular internal cavity size was normal in size. There is  no left ventricular hypertrophy. Left ventricular diastolic parameters are indeterminate. Right Ventricle: The right ventricular size is mildly enlarged. No increase in right ventricular wall thickness. Right ventricular systolic function is normal. There is mildly elevated pulmonary artery systolic pressure. The tricuspid regurgitant velocity is 2.90 m/s, and with an assumed right atrial pressure of 3 mmHg, the estimated right ventricular systolic pressure is 65.7 mmHg. Left Atrium: Left atrial size was normal in size. Right Atrium: Right atrial size was normal in size. Pericardium: There is no evidence of  pericardial effusion. Mitral Valve: The mitral valve is normal in structure. Trivial mitral valve regurgitation. No evidence of mitral valve stenosis. Tricuspid Valve: The tricuspid valve is normal in structure. Tricuspid valve regurgitation is trivial. Aortic Valve: The aortic valve is tricuspid. Aortic valve regurgitation is not visualized. No aortic stenosis is present. Pulmonic Valve: The pulmonic valve was normal in structure. Pulmonic valve regurgitation is not visualized. Aorta: The aortic root is normal in size and  structure. Venous: The inferior vena cava is normal in size with greater than 50% respiratory variability, suggesting right atrial pressure of 3 mmHg. IAS/Shunts: No atrial level shunt detected by color flow Doppler.  LEFT VENTRICLE PLAX 2D LVIDd:         4.60 cm LVIDs:         4.20 cm LV PW:         0.90 cm LV IVS:        0.90 cm LVOT diam:     1.90 cm LV SV:         41 LV SV Index:   19 LVOT Area:     2.84 cm  LV Volumes (MOD) LV vol d, MOD A2C: 67.5 ml LV vol d, MOD A4C: 82.3 ml LV vol s, MOD A2C: 38.7 ml LV vol s, MOD A4C: 43.3 ml LV SV MOD A2C:     28.8 ml LV SV MOD A4C:     82.3 ml LV SV MOD BP:      32.8 ml RIGHT VENTRICLE TAPSE (M-mode): 1.7 cm LEFT ATRIUM             Index       RIGHT ATRIUM           Index LA diam:        4.00 cm 1.85 cm/m  RA Area:     16.40 cm LA Vol (A2C):   28.6 ml 13.20 ml/m RA Volume:   38.90 ml  17.96 ml/m LA Vol (A4C):   39.8 ml 18.37 ml/m LA Biplane Vol: 35.2 ml 16.25 ml/m  AORTIC VALVE LVOT Vmax:   84.20 cm/s LVOT Vmean:  53.600 cm/s LVOT VTI:    0.145 m  AORTA Ao Root diam: 3.50 cm TRICUSPID VALVE TR Peak grad:   33.6 mmHg TR Vmax:        290.00 cm/s  SHUNTS Systemic VTI:  0.14 m Systemic Diam: 1.90 cm Loralie Champagne MD Electronically signed by Loralie Champagne MD Signature Date/Time: 07/23/2019/6:59:20 PM    Final      Cardiac Studies   ECHO:  07/22/2019 1. Left ventricular ejection fraction, by estimation, is 55 to 60%. The  left ventricle has  normal function. The left ventricle has no regional  wall motion abnormalities. Left ventricular diastolic parameters are  indeterminate.  2. Right ventricular systolic function is normal. The right ventricular  size is mildly enlarged. There is mildly elevated pulmonary artery  systolic pressure. The estimated right ventricular systolic pressure is  85.2 mmHg.  3. The mitral valve is normal in structure. Trivial mitral valve  regurgitation. No evidence of mitral stenosis.  4. The aortic valve is tricuspid. Aortic valve regurgitation is not  visualized. No aortic stenosis is present.  5. The inferior vena cava is normal in size with greater than 50%  respiratory variability, suggesting right atrial pressure of 3 mmHg.  6. The patient was in atrial fibrillation.   Patient Profile     74 y.o. 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  Assessment & Plan    1. SBO - NG tube in place - CCS following, he may need surgery if no improvement soon  2. Atrial fib, RVR - Chronic, pt asymptomatic - HR appropriate for acute illness - rate management w/ IV  Cardizem, no BB 2nd wheezing  3. COPD - O2 sats are good on 5 lpm [place] - now on Xopenex nebs  Otherwise, per IM Principal Problem:   SBO (small bowel obstruction) ([place]) Active Problems:   A-fib ([place])    Signed, Rosaria Ferries , PA-C 10:06 AM 07/24/2019 Pager: [phone]   Patient seen and examined.  Agree with above documentation. On exam, patient is alert and oriented, normal rate, irregular rhythm, no murmurs, lungs with expiratory wheezing, no LE edema.  Continues to report abdominal pain, denies any chest pain, dyspnea, or heart palpitations.  TTE yesterday shows normal systolic function.  Telemetry shows atrial fibrillation with a rate 80s to 110s.  Heart rate  appears controlled on IV Cardizem drip at 15 mg/h.  Would continue Cardizem drip while n.p.o.  Avoid beta-blockers given COPD/active wheezing.  Continue heparin drip.  Donato Heinz, MD

## 2019-07-25 ENCOUNTER — Encounter (HOSPITAL_COMMUNITY): Payer: Self-pay | Admitting: Internal Medicine

## 2019-07-25 ENCOUNTER — Encounter (HOSPITAL_COMMUNITY)
Admission: EM | Disposition: A | Discharge status: Home Health | Payer: Self-pay | Source: Home / Self Care | Attending: Internal Medicine

## 2019-07-25 ENCOUNTER — Inpatient Hospital Stay (HOSPITAL_COMMUNITY): Payer: Medicare Other | Admitting: Anesthesiology

## 2019-07-25 ENCOUNTER — Inpatient Hospital Stay (HOSPITAL_COMMUNITY): Payer: Medicare Other

## 2019-07-25 HISTORY — PX: LAPAROTOMY: SHX154

## 2019-07-25 LAB — BASIC METABOLIC PANEL
Anion gap: 13 (ref 5–15)
BUN: 38 mg/dL — ABNORMAL HIGH (ref 8–23)
CO2: 35 mmol/L — ABNORMAL HIGH (ref 22–32)
Calcium: 8.8 mg/dL — ABNORMAL LOW (ref 8.9–10.3)
Chloride: 91 mmol/L — ABNORMAL LOW (ref 98–111)
Creatinine, Ser: 1.41 mg/dL — ABNORMAL HIGH (ref 0.61–1.24)
GFR calc Af Amer: 56 mL/min — ABNORMAL LOW (ref >60)
GFR calc non Af Amer: 48 mL/min — ABNORMAL LOW (ref >60)
Glucose, Bld: 131 mg/dL — ABNORMAL HIGH (ref 70–99)
Potassium: 3.4 mmol/L — ABNORMAL LOW (ref 3.5–5.1)
Sodium: 139 mmol/L (ref 135–145)

## 2019-07-25 LAB — HEPARIN LEVEL (UNFRACTIONATED): Heparin Unfractionated: 0.28 IU/mL — ABNORMAL LOW (ref 0.30–0.70)

## 2019-07-25 LAB — CBC
HCT: 38.7 % — ABNORMAL LOW (ref 39.0–52.0)
Hemoglobin: 12.7 g/dL — ABNORMAL LOW (ref 13.0–17.0)
MCH: 35.1 pg — ABNORMAL HIGH (ref 26.0–34.0)
MCHC: 32.8 g/dL (ref 30.0–36.0)
MCV: 106.9 fL — ABNORMAL HIGH (ref 80.0–100.0)
Platelets: 170 10*3/uL (ref 150–400)
RBC: 3.62 MIL/uL — ABNORMAL LOW (ref 4.22–5.81)
RDW: 13.8 % (ref 11.5–15.5)
WBC: 10.1 10*3/uL (ref 4.0–10.5)
nRBC: 0 % (ref 0.0–0.2)

## 2019-07-25 LAB — GLUCOSE, CAPILLARY
Glucose-Capillary: 128 mg/dL — ABNORMAL HIGH (ref 70–99)
Glucose-Capillary: 127 mg/dL — ABNORMAL HIGH (ref 70–99)
Glucose-Capillary: 138 mg/dL — ABNORMAL HIGH (ref 70–99)

## 2019-07-25 LAB — MAGNESIUM: Magnesium: 1.7 mg/dL (ref 1.7–2.4)

## 2019-07-25 SURGERY — LAPAROTOMY, EXPLORATORY
Anesthesia: General

## 2019-07-25 MED ORDER — DEXAMETHASONE SODIUM PHOSPHATE 10 MG/ML IJ SOLN
INTRAMUSCULAR | Status: AC
  Filled 2019-07-25: qty 1

## 2019-07-25 MED ORDER — ACETAMINOPHEN 10 MG/ML IV SOLN
INTRAVENOUS | Status: AC
  Filled 2019-07-25: qty 100

## 2019-07-25 MED ORDER — DEXAMETHASONE SODIUM PHOSPHATE 10 MG/ML IJ SOLN
INTRAMUSCULAR | Status: DC | PRN
  Administered 2019-07-25: 5 mg via INTRAVENOUS

## 2019-07-25 MED ORDER — FENTANYL CITRATE (PF) 250 MCG/5ML IJ SOLN
INTRAMUSCULAR | Status: AC
  Filled 2019-07-25: qty 5

## 2019-07-25 MED ORDER — SUGAMMADEX SODIUM 200 MG/2ML IV SOLN
INTRAVENOUS | Status: DC | PRN
  Administered 2019-07-25: 200 mg via INTRAVENOUS

## 2019-07-25 MED ORDER — LIDOCAINE HCL (CARDIAC) PF 100 MG/5ML IV SOSY
PREFILLED_SYRINGE | INTRAVENOUS | Status: DC | PRN
  Administered 2019-07-25: 40 mg via INTRAVENOUS

## 2019-07-25 MED ORDER — LEVALBUTEROL HCL 0.63 MG/3ML IN NEBU
0.6300 mg | INHALATION_SOLUTION | Freq: Four times a day (QID) | RESPIRATORY_TRACT | Status: DC | PRN
  Administered 2019-08-01: 0.63 mg via RESPIRATORY_TRACT

## 2019-07-25 MED ORDER — ALBUMIN HUMAN 5 % IV SOLN
INTRAVENOUS | Status: AC
  Filled 2019-07-25: qty 500

## 2019-07-25 MED ORDER — LACTATED RINGERS IV SOLN: INTRAVENOUS | Status: DC

## 2019-07-25 MED ORDER — PROPOFOL 10 MG/ML IV BOLUS
INTRAVENOUS | Status: AC
  Filled 2019-07-25: qty 20

## 2019-07-25 MED ORDER — 0.9 % SODIUM CHLORIDE (POUR BTL) OPTIME
TOPICAL | Status: DC | PRN
  Administered 2019-07-25: 2000 mL

## 2019-07-25 MED ORDER — EPHEDRINE SULFATE 50 MG/ML IJ SOLN
INTRAMUSCULAR | Status: DC | PRN
  Administered 2019-07-25: 25 mg via INTRAVENOUS

## 2019-07-25 MED ORDER — ROCURONIUM BROMIDE 100 MG/10ML IV SOLN
INTRAVENOUS | Status: DC | PRN
  Administered 2019-07-25: 50 mg via INTRAVENOUS

## 2019-07-25 MED ORDER — ONDANSETRON HCL 4 MG/2ML IJ SOLN
INTRAMUSCULAR | Status: DC | PRN
  Administered 2019-07-25: 4 mg via INTRAVENOUS

## 2019-07-25 MED ORDER — ACETAMINOPHEN 10 MG/ML IV SOLN
1000.0000 mg | Freq: Once | INTRAVENOUS | Status: DC | PRN
  Administered 2019-07-25: 1000 mg via INTRAVENOUS

## 2019-07-25 MED ORDER — SUCCINYLCHOLINE CHLORIDE 200 MG/10ML IV SOSY
PREFILLED_SYRINGE | INTRAVENOUS | Status: AC
  Filled 2019-07-25: qty 10

## 2019-07-25 MED ORDER — FENTANYL CITRATE (PF) 100 MCG/2ML IJ SOLN
INTRAMUSCULAR | Status: DC | PRN
  Administered 2019-07-25: 100 ug via INTRAVENOUS
  Administered 2019-07-25 (×3): 50 ug via INTRAVENOUS

## 2019-07-25 MED ORDER — CEFAZOLIN SODIUM-DEXTROSE 2-4 GM/100ML-% IV SOLN
2.0000 g | INTRAVENOUS | Status: AC
  Administered 2019-07-25: 2 g via INTRAVENOUS
  Filled 2019-07-25: qty 100

## 2019-07-25 MED ORDER — HYDROMORPHONE HCL 1 MG/ML IJ SOLN
INTRAMUSCULAR | Status: AC
  Filled 2019-07-25: qty 2

## 2019-07-25 MED ORDER — HYDROMORPHONE HCL 1 MG/ML IJ SOLN
0.2500 mg | INTRAMUSCULAR | Status: DC | PRN
  Administered 2019-07-25 (×3): 0.5 mg via INTRAVENOUS

## 2019-07-25 MED ORDER — SUCCINYLCHOLINE CHLORIDE 20 MG/ML IJ SOLN
INTRAMUSCULAR | Status: DC | PRN
  Administered 2019-07-25: 160 mg via INTRAVENOUS

## 2019-07-25 MED ORDER — POTASSIUM CHLORIDE 10 MEQ/100ML IV SOLN
10.0000 meq | INTRAVENOUS | Status: DC
  Administered 2019-07-25 (×5): 10 meq via INTRAVENOUS
  Filled 2019-07-25 (×5): qty 100

## 2019-07-25 MED ORDER — ACETAMINOPHEN 10 MG/ML IV SOLN
1000.0000 mg | Freq: Four times a day (QID) | INTRAVENOUS | Status: AC
  Administered 2019-07-25 – 2019-07-26 (×4): 1000 mg via INTRAVENOUS
  Filled 2019-07-25 (×4): qty 100

## 2019-07-25 MED ORDER — ROCURONIUM BROMIDE 10 MG/ML (PF) SYRINGE
PREFILLED_SYRINGE | INTRAVENOUS | Status: AC
  Filled 2019-07-25: qty 10

## 2019-07-25 MED ORDER — HEPARIN (PORCINE) 25000 UT/250ML-% IV SOLN
1650.0000 [IU]/h | INTRAVENOUS | Status: DC
  Administered 2019-07-25 – 2019-07-26 (×2): 1650 [IU]/h via INTRAVENOUS
  Filled 2019-07-25: qty 250

## 2019-07-25 MED ORDER — ALBUMIN HUMAN 5 % IV SOLN: INTRAVENOUS | Status: DC | PRN

## 2019-07-25 MED ORDER — HYDROMORPHONE HCL 1 MG/ML IJ SOLN
0.5000 mg | INTRAMUSCULAR | Status: DC | PRN
  Administered 2019-07-25 – 2019-07-27 (×11): 1 mg via INTRAVENOUS
  Administered 2019-07-28 (×2): 0.5 mg via INTRAVENOUS
  Administered 2019-07-29 – 2019-08-02 (×9): 1 mg via INTRAVENOUS
  Administered 2019-08-03 (×2): 0.5 mg via INTRAVENOUS
  Administered 2019-08-03 – 2019-08-04 (×4): 1 mg via INTRAVENOUS
  Filled 2019-07-25 (×29): qty 1

## 2019-07-25 MED ORDER — PHENYLEPHRINE HCL (PRESSORS) 10 MG/ML IV SOLN
INTRAVENOUS | Status: DC | PRN
Start: 2019-07-25 — End: 2019-07-25
  Administered 2019-07-25: 200 ug via INTRAVENOUS

## 2019-07-25 MED ORDER — METHOCARBAMOL 1000 MG/10ML IJ SOLN
1000.0000 mg | Freq: Three times a day (TID) | INTRAVENOUS | Status: DC
  Administered 2019-07-25 – 2019-07-26 (×2): 1000 mg via INTRAVENOUS
  Filled 2019-07-25: qty 1000
  Filled 2019-07-25: qty 10
  Filled 2019-07-25: qty 1000

## 2019-07-25 MED ORDER — ONDANSETRON HCL 4 MG/2ML IJ SOLN
INTRAMUSCULAR | Status: AC
  Filled 2019-07-25: qty 2

## 2019-07-25 MED ORDER — LIDOCAINE 2% (20 MG/ML) 5 ML SYRINGE
INTRAMUSCULAR | Status: AC
  Filled 2019-07-25: qty 5

## 2019-07-25 MED ORDER — PROMETHAZINE HCL 25 MG/ML IJ SOLN: 6.2500 mg | INTRAMUSCULAR | Status: DC | PRN

## 2019-07-25 MED ORDER — PROPOFOL 10 MG/ML IV BOLUS
INTRAVENOUS | Status: DC | PRN
  Administered 2019-07-25: 150 mg via INTRAVENOUS

## 2019-07-25 MED ORDER — MEPERIDINE HCL 50 MG/ML IJ SOLN: 6.2500 mg | INTRAMUSCULAR | Status: DC | PRN

## 2019-07-25 SURGICAL SUPPLY — 46 items
APPLICATOR COTTON TIP 6 STRL (MISCELLANEOUS) ×1 IMPLANT
APPLICATOR COTTON TIP 6IN STRL (MISCELLANEOUS) ×2 IMPLANT
BLADE EXTENDED COATED 6.5IN (ELECTRODE) ×1 IMPLANT
BLADE HEX COATED 2.75 (ELECTRODE) ×2 IMPLANT
BNDG GAUZE ELAST 4 BULKY (GAUZE/BANDAGES/DRESSINGS) ×1 IMPLANT
COVER MAYO STAND STRL (DRAPES) ×2 IMPLANT
COVER SURGICAL LIGHT HANDLE (MISCELLANEOUS) ×2 IMPLANT
COVER WAND RF STERILE (DRAPES) IMPLANT
DRAIN CHANNEL 19F RND (DRAIN) IMPLANT
DRAPE LAPAROSCOPIC ABDOMINAL (DRAPES) ×2 IMPLANT
DRAPE WARM FLUID 44X44 (DRAPES) ×2 IMPLANT
DRSG PAD ABDOMINAL 8X10 ST (GAUZE/BANDAGES/DRESSINGS) ×1 IMPLANT
ELECT REM PT RETURN 15FT ADLT (MISCELLANEOUS) ×2 IMPLANT
EVACUATOR SILICONE 100CC (DRAIN) IMPLANT
GAUZE SPONGE 4X4 12PLY STRL (GAUZE/BANDAGES/DRESSINGS) ×2 IMPLANT
GLOVE BIOGEL M 8.0 STRL (GLOVE) ×2 IMPLANT
GOWN SPEC L4 XLG W/TWL (GOWN DISPOSABLE) ×2 IMPLANT
GOWN STRL REUS W/TWL XL LVL3 (GOWN DISPOSABLE) ×6 IMPLANT
HANDLE STAPLE EGIA 4 XL (STAPLE) ×1 IMPLANT
HANDLE SUCTION POOLE (INSTRUMENTS) IMPLANT
KIT BASIN (CUSTOM PROCEDURE TRAY) ×2 IMPLANT
KIT TURNOVER KIT A (KITS) IMPLANT
NS IRRIG 1000ML POUR BTL (IV SOLUTION) ×8 IMPLANT
PACK GENERAL/GYN (CUSTOM PROCEDURE TRAY) ×2 IMPLANT
PENCIL SMOKE EVACUATOR (MISCELLANEOUS) IMPLANT
RELOAD EGIA 60 MED/THCK PURPLE (STAPLE) ×2 IMPLANT
RELOAD EGIA 60 TAN VASC (STAPLE) ×1 IMPLANT
RELOAD STAPLE 60 MED/THCK ART (STAPLE) IMPLANT
SPONGE LAP 18X18 RF (DISPOSABLE) ×4 IMPLANT
STAPLER VISISTAT 35W (STAPLE) ×2 IMPLANT
SUCTION POOLE HANDLE (INSTRUMENTS)
SUT NOVA 1 T20/GS 25DT (SUTURE) ×2 IMPLANT
SUT PDS AB 1 CTX 36 (SUTURE) IMPLANT
SUT SILK 2 0 (SUTURE) ×1
SUT SILK 2 0 SH CR/8 (SUTURE) ×2 IMPLANT
SUT SILK 2-0 18XBRD TIE 12 (SUTURE) ×1 IMPLANT
SUT SILK 3 0 (SUTURE) ×1
SUT SILK 3 0 SH CR/8 (SUTURE) ×2 IMPLANT
SUT SILK 3-0 18XBRD TIE 12 (SUTURE) ×1 IMPLANT
SUT VIC AB 3-0 SH 18 (SUTURE) IMPLANT
SUT VICRYL 2 0 18  UND BR (SUTURE)
SUT VICRYL 2 0 18 UND BR (SUTURE) IMPLANT
TAPE PAPER 3X10 WHT MICROPORE (GAUZE/BANDAGES/DRESSINGS) ×1 IMPLANT
TOWEL OR 17X26 10 PK STRL BLUE (TOWEL DISPOSABLE) ×2 IMPLANT
TOWEL OR NON WOVEN STRL DISP B (DISPOSABLE) ×2 IMPLANT
TRAY FOLEY MTR SLVR 16FR STAT (SET/KITS/TRAYS/PACK) ×2 IMPLANT

## 2019-07-25 NOTE — Progress Notes (Signed)
[facility] Progress Note     Subjective: Ongoing abdominal pain worse in lower abdomen. Pain transiently improves with pain meds. Denies reliable flatus - maybe small amt last night. No BM. Per RN passed small amount rectal mucous after suppository yesterday.   HR improved on IV cardizem BUN 38/SCr 1.41 Objective: Vital signs in last 24 hours: Temp:  [96.8 F (36 C)-98.9 F (37.2 C)] 96.8 F (36 C) (06/04 0334) Pulse Rate:  [45-121] 89 (06/04 0640) Resp:  [16-24] 17 (06/04 0640) BP: (96-159)/(43-72) 103/43 (06/04 0640) SpO2:  [89 %-100 %] 96 % (06/04 0640) Last BM Date: 07/19/19  Intake/Output from previous day: 06/03 0701 - 06/04 0700 In: 736.3 [I.V.:736.3] Out: 1400 [Urine:200; Emesis/NG output:1200] Intake/Output this shift: No intake/output data recorded.  PE: General: pleasant, WD, chronically ill appearing white male laying in bed in NAD Heart: regular rate, palpable radial and pedal pulses bilaterally Lungs: wheezing throughout bilateral lung fields Abd: soft, distended, +BS hypoactive, TTP suprapubic region, NGT with thick green drainage (1200 cc/24h)   Lab Results:  Recent Labs    07/24/19 0036 07/25/19 0200  WBC 4.7 10.1  HGB 13.0 12.7*  HCT 39.9 38.7*  PLT 214 170   BMET Recent Labs    07/24/19 0036 07/25/19 0204  NA 143 139  K 3.6 3.4*  CL 94* 91*  CO2 38* 35*  GLUCOSE 122* 131*  BUN 33* 38*  CREATININE 1.14 1.41*  CALCIUM 8.7* 8.8*   PT/INR Recent Labs    07/24/19 0036  LABPROT 13.7  INR 1.1   CMP     Component Value Date/Time   NA 139 07/25/2019 0204   K 3.4 (L) 07/25/2019 0204   CL 91 (L) 07/25/2019 0204   CO2 35 (H) 07/25/2019 0204   GLUCOSE 131 (H) 07/25/2019 0204   BUN 38 (H) 07/25/2019 0204   CREATININE 1.41 (H) 07/25/2019 0204   CALCIUM 8.8 (L) 07/25/2019 0204   PROT 7.4 07/22/2019 1854   ALBUMIN 4.4 07/22/2019 1854   AST 21 07/22/2019 1854   ALT 18 07/22/2019 1854   ALKPHOS 68 07/22/2019 1854    BILITOT 2.1 (H) 07/22/2019 1854   GFRNONAA 48 (L) 07/25/2019 0204   GFRAA 56 (L) 07/25/2019 0204   Lipase     Component Value Date/Time   LIPASE 19 07/22/2019 1854       Studies/Results: DG Abd 1 View  Result Date: 07/25/2019 CLINICAL DATA:  Check gastric catheter placement EXAM: ABDOMEN - 1 VIEW COMPARISON:  None. FINDINGS: Gastric catheter is noted with the tip in the stomach. The proximal side port lies in the distal esophagus and should be advanced several cm. This is similar to that seen on prior exam. Multiple dilated loops of small bowel are again noted. No free air is seen. IMPRESSION: Gastric catheter as described. This should be advanced further into the stomach. Electronically Signed   By: Inez Catalina M.D.   On: 07/25/2019 02:31   DG Abd 1 View  Result Date: 07/24/2019 CLINICAL DATA:  Small-bowel obstruction EXAM: ABDOMEN - 1 VIEW COMPARISON:  07/23/2019 FINDINGS: Interval improvement in small bowel dilatation since yesterday. Colon is decompressed. Surgical clips in the rectum. NG tip in the gastric fundus. No acute skeletal abnormality. IMPRESSION: Interval improvement in dilated small bowel loops. NG tip in the gastric fundus. Electronically Signed   By: Franchot Gallo M.D.   On: 07/24/2019 10:11   DG Abd Portable 1V-Small Bowel Obstruction Protocol-initial, 8 hr delay  Result Date: 07/23/2019  CLINICAL DATA:  Check small-bowel obstruction EXAM: PORTABLE ABDOMEN - 1 VIEW COMPARISON:  Film from earlier in the same day. FINDINGS: Contrast material remains in the bladder from prior CT examination. Multiple dilated loops of small bowel are noted. Contrast from the stomach now lies predominately within the jejunum. No definitive colonic contrast is seen. Continued follow-up is recommended. IMPRESSION: Persistent small-bowel obstruction with contrast material throughout the jejunum. No definitive colonic contrast is seen at this time. Continued follow-up is recommended. Electronically  Signed   By: Inez Catalina M.D.   On: 07/23/2019 20:24   DG Abd Portable 1V-Small Bowel Protocol-Position Verification  Result Date: 07/23/2019 CLINICAL DATA:  NG tube placement.  Small bowel obstruction. EXAM: PORTABLE ABDOMEN - 1 VIEW COMPARISON:  CT abdomen and pelvis 07/22/2019. FINDINGS: NG tube is in place with sideport just proximal to the EG junction. Recommend advancement of 3-4 cm. No radio-opaque calculi or other significant radiographic abnormality are seen. IMPRESSION: NG tube sideport is at the gastroesophageal junction. Recommend advancement of 3-4 cm. Electronically Signed   By: Inge Rise M.D.   On: 07/23/2019 13:09   ECHOCARDIOGRAM COMPLETE  Result Date: 07/23/2019    ECHOCARDIOGRAM REPORT   Patient Name:   Jake [name] Date of Exam: 07/23/2019 Medical Rec #:  [id number]     Height:       73.0 in Accession #:    [id number]    Weight:       203.3 lb Date of Birth:  08/15/1945    BSA:          2.166 m Patient Age:    74 years      BP:           89/54 mmHg Patient Gender: M             HR:           105 bpm. Exam Location:  Inpatient Procedure: 2D Echo, Cardiac Doppler and Color Doppler Indications:    Atrial Fibrillation 427.31 / I48.91  History:        Patient has no prior history of Echocardiogram examinations.                 Arrythmias:Atrial Fibrillation; Risk Factors:Current Smoker.  Sonographer:    Vickie Epley RDCS Referring Phys: [id number] RAVI Boon  1. Left ventricular ejection fraction, by estimation, is 55 to 60%. The left ventricle has normal function. The left ventricle has no regional wall motion abnormalities. Left ventricular diastolic parameters are indeterminate.  2. Right ventricular systolic function is normal. The right ventricular size is mildly enlarged. There is mildly elevated pulmonary artery systolic pressure. The estimated right ventricular systolic pressure is 21.1 mmHg.  3. The mitral valve is normal in structure. Trivial mitral valve  regurgitation. No evidence of mitral stenosis.  4. The aortic valve is tricuspid. Aortic valve regurgitation is not visualized. No aortic stenosis is present.  5. The inferior vena cava is normal in size with greater than 50% respiratory variability, suggesting right atrial pressure of 3 mmHg.  6. The patient was in atrial fibrillation. FINDINGS  Left Ventricle: Left ventricular ejection fraction, by estimation, is 55 to 60%. The left ventricle has normal function. The left ventricle has no regional wall motion abnormalities. The left ventricular internal cavity size was normal in size. There is  no left ventricular hypertrophy. Left ventricular diastolic parameters are indeterminate. Right Ventricle: The right ventricular size is mildly enlarged. No increase in right ventricular wall thickness. Right  ventricular systolic function is normal. There is mildly elevated pulmonary artery systolic pressure. The tricuspid regurgitant velocity is 2.90 m/s, and with an assumed right atrial pressure of 3 mmHg, the estimated right ventricular systolic pressure is 32.3 mmHg. Left Atrium: Left atrial size was normal in size. Right Atrium: Right atrial size was normal in size. Pericardium: There is no evidence of pericardial effusion. Mitral Valve: The mitral valve is normal in structure. Trivial mitral valve regurgitation. No evidence of mitral valve stenosis. Tricuspid Valve: The tricuspid valve is normal in structure. Tricuspid valve regurgitation is trivial. Aortic Valve: The aortic valve is tricuspid. Aortic valve regurgitation is not visualized. No aortic stenosis is present. Pulmonic Valve: The pulmonic valve was normal in structure. Pulmonic valve regurgitation is not visualized. Aorta: The aortic root is normal in size and structure. Venous: The inferior vena cava is normal in size with greater than 50% respiratory variability, suggesting right atrial pressure of 3 mmHg. IAS/Shunts: No atrial level shunt detected by  color flow Doppler.  LEFT VENTRICLE PLAX 2D LVIDd:         4.60 cm LVIDs:         4.20 cm LV PW:         0.90 cm LV IVS:        0.90 cm LVOT diam:     1.90 cm LV SV:         41 LV SV Index:   19 LVOT Area:     2.84 cm  LV Volumes (MOD) LV vol d, MOD A2C: 67.5 ml LV vol d, MOD A4C: 82.3 ml LV vol s, MOD A2C: 38.7 ml LV vol s, MOD A4C: 43.3 ml LV SV MOD A2C:     28.8 ml LV SV MOD A4C:     82.3 ml LV SV MOD BP:      32.8 ml RIGHT VENTRICLE TAPSE (M-mode): 1.7 cm LEFT ATRIUM             Index       RIGHT ATRIUM           Index LA diam:        4.00 cm 1.85 cm/m  RA Area:     16.40 cm LA Vol (A2C):   28.6 ml 13.20 ml/m RA Volume:   38.90 ml  17.96 ml/m LA Vol (A4C):   39.8 ml 18.37 ml/m LA Biplane Vol: 35.2 ml 16.25 ml/m  AORTIC VALVE LVOT Vmax:   84.20 cm/s LVOT Vmean:  53.600 cm/s LVOT VTI:    0.145 m  AORTA Ao Root diam: 3.50 cm TRICUSPID VALVE TR Peak grad:   33.6 mmHg TR Vmax:        290.00 cm/s  SHUNTS Systemic VTI:  0.14 m Systemic Diam: 1.90 cm Loralie Champagne MD Electronically signed by Loralie Champagne MD Signature Date/Time: 07/23/2019/6:59:20 PM    Final     Anti-infectives: Anti-infectives (From admission, onward)   None       Assessment/Plan COPD/100+ pack year history -currently @ 1/2 PPD Rheumatoid arthritis Atrial fibrillation - controlled on cardizem gtt, hep gtt Dehydration/acute renal failure - Cr 1.41 from 1.14 PMH RA on methotrexate   Small bowel obstruction with history of colon resection in 1980s - small bowel protocol started 6/3, no obvious contrast in colon - NGT with 1200 cc out in 24 hrs, no bowel function  - film this AM with persistently dilated loops of bowel, NGT side port at GE jxn. RN to advance NGT. - no  emergent role for surgery but this patient is not clinically improving with non-operative management. I believe he will require surgical intervention. Will discuss with MD.  FEN: N.p.o./IV fluids, hypokalemia (3.4) - replete. Check Mg ID: None VTE: heparin  gtt Follow-up: TBD   LOS: 3 days    Jill Alexanders , [facility] 07/25/2019, 7:41 AM Please see Amion for pager number during day hours 7:00am-4:30pm

## 2019-07-25 NOTE — Op Note (Signed)
Jake [name]  Mar 28, 1945   07/25/2019    PCP:  Glendon Axe, MD   Surgeon: Kaylyn Lim, MD, FACS  Asst:  Barkley Boards, PA  Anes:  general  Preop Dx: Small bowel obstruction Postop Dx: Complete small bowel obstruction with dense adhesive bands over the pelvic brim  Procedure: Exploratory laparotomy and enterolysis, decompressive enterotomy (1 liter) and small bowel anastomosis with 6 cm staplers Location Surgery: [facility] 4 Complications: none  EBL:   20 cc  Drains: none  Description of Procedure:  The patient was taken to OR 4 .  After anesthesia was administered and the patient was prepped  with chloroprep  and a timeout was performed.  Midline incision was used to enter the abdomen.  This went from just above the umbilicus to below the umbilicus and the abdomen was entered without difficulty.  Patient have had a previous laparotomy by Dr. Nathaneil Canary in [facility] many years ago.  We encountered very dilated small bowel throughout and traced it down into toward the pelvis.  We identified decompressed bowel distally and traced it backwards to the point where it was stuck down beneath adhesive band there over the pelvic brim.  These adhesions were carefully lysed.  We created an enterotomy of the bowel and control the copious amount of material within the small bowel with a pool sucker which we inserted and got over a liter of fluid.  When we had this decompressed back to the ligament of Treitz we created a neoanastomosis with 6 cm Covidien brown load creating the enteroenterotomy and closing the common defect with a separate firing of a 6 cm purple Covidien load.  A crotch stitch of silk was placed.  Meanwhile we irrigated the abdomen with copious amount of saline.  No other issues were identified with the bowel.  We changed our gloves and irrigated again and then closed the abdomen with interrupted #1 Novafil's and packed the wound open.  The patient tolerated the procedure well and was taken  to the PACU in stable condition.     Matt B. Hassell Done, [place], [facility], [place]

## 2019-07-25 NOTE — Anesthesia Procedure Notes (Signed)
Procedure Name: Intubation Date/Time: 07/25/2019 12:36 PM Performed by: Glory Buff, CRNA Pre-anesthesia Checklist: Patient identified, Emergency Drugs available, Suction available and Patient being monitored Patient Re-evaluated:Patient Re-evaluated prior to induction Oxygen Delivery Method: Circle system utilized Preoxygenation: Pre-oxygenation with 100% oxygen Induction Type: IV induction, Rapid sequence and Cricoid Pressure applied Ventilation: Mask ventilation without difficulty Laryngoscope Size: Miller and 3 Grade View: Grade I Tube type: Oral Number of attempts: 1 Airway Equipment and Method: Stylet and Oral airway Placement Confirmation: ETT inserted through vocal cords under direct vision,  positive ETCO2 and breath sounds checked- equal and bilateral Secured at: 22 cm Tube secured with: Tape Dental Injury: Teeth and Oropharynx as per pre-operative assessment

## 2019-07-25 NOTE — Transfer of Care (Signed)
Immediate Anesthesia Transfer of Care Note  Patient: Jake [name]  Procedure(s) Performed: Suezanne Cheshire AND ANASTOMOSIS OF SMALL BOWEL, ENTEROLYSIS (N/A )  Patient Location: PACU  Anesthesia Type:General  Level of Consciousness: drowsy, pateint uncooperative and responds to stimulation  Airway & Oxygen Therapy: Patient Spontanous Breathing and Patient connected to face mask oxygen  Post-op Assessment: Report given to RN and Post -op Vital signs reviewed and stable  Post vital signs: Reviewed and stable  Last Vitals:  Vitals Value Taken Time  BP 124/106 07/25/19 1418  Temp    Pulse 140 07/25/19 1423  Resp 33 07/25/19 1423  SpO2 91 % 07/25/19 1423  Vitals shown include unvalidated device data.  Last Pain:  Vitals:   07/25/19 1212  TempSrc: Oral  PainSc:       Patients Stated Pain Goal: 0 (98/33/82 [id number])  Complications: No apparent anesthesia complications

## 2019-07-25 NOTE — Anesthesia Preprocedure Evaluation (Addendum)
Anesthesia Evaluation  Patient identified by MRN, date of birth, ID band Patient awake    Reviewed: Allergy & Precautions, NPO status , Patient's Chart, lab work & pertinent test results  Airway Mallampati: I       Dental no notable dental hx. (+) Teeth Intact   Pulmonary COPD, Current Smoker and Patient abstained from smoking.,    Pulmonary exam normal breath sounds clear to auscultation       Cardiovascular hypertension, Pt. on medications + Peripheral Vascular Disease  Normal cardiovascular exam+ dysrhythmias Atrial Fibrillation  Rhythm:Regular Rate:Normal     Neuro/Psych negative neurological ROS  negative psych ROS   GI/Hepatic (+) Hepatitis -, A  Endo/Other  negative endocrine ROS  Renal/GU Renal InsufficiencyRenal disease  negative genitourinary   Musculoskeletal   Abdominal Normal abdominal exam  (+)   Peds  Hematology  (+) anemia ,   Anesthesia Other Findings   Reproductive/Obstetrics                             Anesthesia Physical Anesthesia Plan  ASA: II  Anesthesia Plan:    Post-op Pain Management:    Induction: Intravenous  PONV Risk Score and Plan: 3 and Ondansetron, Dexamethasone and Treatment may vary due to age or medical condition  Airway Management Planned: Oral ETT  Additional Equipment: None  Intra-op Plan:   Post-operative Plan: Extubation in OR  Informed Consent: I have reviewed the patients History and Physical, chart, labs and discussed the procedure including the risks, benefits and alternatives for the proposed anesthesia with the patient or authorized representative who has indicated his/her understanding and acceptance.     Dental advisory given  Plan Discussed with: CRNA  Anesthesia Plan Comments:         Anesthesia Quick Evaluation

## 2019-07-25 NOTE — Progress Notes (Addendum)
NG tube was noted to be off its 30cm mark, NG was replaced to 30cm and air flush was given. Xray ordered to confirm location.  Pt only voided 150 tonight, bladder scan done and 53ml was visualized. Pt O2 sats are staying in high 80s low 90s. IS education provided and patient demonstrated understanding.[facility] provider paged and notified of above. BMP ordered per provider. Will continue to monitor.

## 2019-07-25 NOTE — Progress Notes (Signed)
ANTICOAGULATION CONSULT NOTE - Follow Up Consult  Pharmacy Consult for Heparin Indication: atrial fibrillation  Allergies  Allergen Reactions  . Propoxyphene Nausea And Vomiting    Patient Measurements: Height: 6\' 1"  (185.4 cm) Weight: 92.2 kg (203 lb 4.2 oz) IBW/kg (Calculated) : 79.9 Heparin Dosing Weight:   Vital Signs: Temp: 96.8 F (36 C) (06/04 0334) Temp Source: Axillary (06/04 0334) BP: 127/59 (06/04 0230) Pulse Rate: 95 (06/04 0230)  Labs: Recent Labs    07/22/19 1854 07/23/19 0228 07/23/19 0228 07/23/19 0300 07/24/19 0036 07/24/19 0036 07/24/19 0947 07/24/19 1942 07/25/19 0200 07/25/19 0204  HGB  --  13.1   < >  --  13.0  --   --   --  12.7*  --   HCT  --  40.4  --   --  39.9  --   --   --  38.7*  --   PLT  --  209  --   --  214  --   --   --  170  --   LABPROT  --   --   --   --  13.7  --   --   --   --   --   INR  --   --   --   --  1.1  --   --   --   --   --   HEPARINUNFRC  --   --   --   --  0.15*   < > 0.13* 0.48 0.28*  --   CREATININE   < >  --   --  1.43* 1.14  --   --   --   --  1.41*  TROPONINIHS  --   --   --  24*  --   --   --   --   --   --    < > = values in this interval not displayed.    Estimated Creatinine Clearance: 51.2 mL/min (A) (by C-G formula based on SCr of 1.41 mg/dL (H)).   Medications:  Infusions:  . diltiazem (CARDIZEM) infusion 15 mg/hr (07/25/19 0332)  . heparin 1,500 Units/hr (07/25/19 0332)    Assessment: Patient with low heparin level.  No heparin issues per RN.  Goal of Therapy:  Heparin level 0.3-0.7 units/ml Monitor platelets by anticoagulation protocol: Yes   Plan:  Increase heparin to 1650 units/hr Recheck level at [address] [place] 07/25/2019,4:31 AM

## 2019-07-25 NOTE — Progress Notes (Addendum)
Progress Note  Patient Name: Jake [name] Date of Encounter: 07/25/2019  Primary Cardiologist:  No primary care provider on file.  Dr Claudie Leach from [facility] (assigned to him, he has seen [name], [facility])  Subjective   Still w/ abd pain, not aware of Afib or PVCs, no chest pain or SOB  Inpatient Medications    Scheduled Meds: . chlorhexidine  15 mL Mouth Rinse BID  . Chlorhexidine Gluconate Cloth  6 each Topical Daily  . levalbuterol  0.63 mg Nebulization TID  . mouth rinse  15 mL Mouth Rinse BID   Continuous Infusions: . diltiazem (CARDIZEM) infusion 15 mg/hr (07/25/19 0743)  . heparin 1,650 Units/hr (07/25/19 0438)   PRN Meds: HYDROmorphone (DILAUDID) injection, labetalol, levalbuterol, ondansetron **OR** ondansetron (ZOFRAN) IV   Vital Signs    Vitals:   07/25/19 0230 07/25/19 0334 07/25/19 0600 07/25/19 0640  BP: (!) 127/59  (!) 117/55 (!) 103/43  Pulse: 95  67 89  Resp: 17  18 17   Temp:  (!) 96.8 F (36 C)    TempSrc:  Axillary    SpO2: 90%  98% 96%  Weight:      Height:        Intake/Output Summary (Last 24 hours) at 07/25/2019 0752 Last data filed at 07/25/2019 0743 Gross per 24 hour  Intake 639.92 ml  Output 1450 ml  Net -810.08 ml   Filed Weights   07/22/19 1812 07/22/19 2330  Weight: 95.3 kg 92.2 kg   Last Weight  Most recent update: 07/22/2019 11:56 PM   Weight  92.2 kg (203 lb 4.2 oz)           Weight change:    Telemetry    Afib, rate approx 100 - Personally Reviewed  ECG    None today - Personally Reviewed  Physical Exam   GEN: No acute distress.   Neck: No JVD seen Cardiac: Irreg R&R, no murmur, no rubs, or gallops.  Respiratory: diminished to auscultation bilaterally, mild wheeze GI: firm, tender, non-distended, +BS MS: No edema; No deformity. Neuro:  Nonfocal  Psych: Normal affect   Labs    Hematology Recent Labs  Lab 07/23/19 0228 07/24/19 0036 07/25/19 0200  WBC 6.8 4.7 10.1  RBC 3.69* 3.65* 3.62*  HGB  13.1 13.0 12.7*  HCT 40.4 39.9 38.7*  MCV 109.5* 109.3* 106.9*  MCH 35.5* 35.6* 35.1*  MCHC 32.4 32.6 32.8  RDW 14.5 14.4 13.8  PLT 209 214 170    Chemistry Recent Labs  Lab 07/22/19 1854 07/22/19 1854 07/23/19 0300 07/24/19 0036 07/25/19 0204  NA 140   < > 143 143 139  K 3.5   < > 4.7 3.6 3.4*  CL 91*   < > 93* 94* 91*  CO2 32   < > 33* 38* 35*  GLUCOSE 143*   < > 110* 122* 131*  BUN 36*   < > 38* 33* 38*  CREATININE 1.37*   < > 1.43* 1.14 1.41*  CALCIUM 9.1   < > 8.8* 8.7* 8.8*  PROT 7.4  --   --   --   --   ALBUMIN 4.4  --   --   --   --   AST 21  --   --   --   --   ALT 18  --   --   --   --   ALKPHOS 68  --   --   --   --   BILITOT 2.1*  --   --   --   --  GFRNONAA 50*   < > 48* >60 48*  GFRAA 58*   < > 55* >60 56*  ANIONGAP 17*   < > 17* 11 13   < > = values in this interval not displayed.     High Sensitivity Troponin:   Recent Labs  Lab 07/23/19 0300  TROPONINIHS 24*      BNPNo results for input(s): BNP, PROBNP in the last 168 hours.   DDimer No results for input(s): DDIMER in the last 168 hours.   Radiology    DG Abd 1 View  Result Date: 07/25/2019 CLINICAL DATA:  Check gastric catheter placement EXAM: ABDOMEN - 1 VIEW COMPARISON:  None. FINDINGS: Gastric catheter is noted with the tip in the stomach. The proximal side port lies in the distal esophagus and should be advanced several cm. This is similar to that seen on prior exam. Multiple dilated loops of small bowel are again noted. No free air is seen. IMPRESSION: Gastric catheter as described. This should be advanced further into the stomach. Electronically Signed   By: Inez Catalina M.D.   On: 07/25/2019 02:31   DG Abd 1 View  Result Date: 07/24/2019 CLINICAL DATA:  Small-bowel obstruction EXAM: ABDOMEN - 1 VIEW COMPARISON:  07/23/2019 FINDINGS: Interval improvement in small bowel dilatation since yesterday. Colon is decompressed. Surgical clips in the rectum. NG tip in the gastric fundus. No acute  skeletal abnormality. IMPRESSION: Interval improvement in dilated small bowel loops. NG tip in the gastric fundus. Electronically Signed   By: Franchot Gallo M.D.   On: 07/24/2019 10:11   DG Abdomen 1 View  Result Date: 07/22/2019 CLINICAL DATA:  NG tube placement EXAM: ABDOMEN - 1 VIEW COMPARISON:  None. FINDINGS: NG tube is in the fundus of the stomach. IMPRESSION: NG tube in the stomach. Electronically Signed   By: Rolm Baptise M.D.   On: 07/22/2019 21:23   CT ABDOMEN PELVIS W CONTRAST  Result Date: 07/22/2019 CLINICAL DATA:  Suspected bowel obstruction. Vomiting for 2 days. Hepatitis C. Hypertension. COPD. EXAM: CT ABDOMEN AND PELVIS WITH CONTRAST TECHNIQUE: Multidetector CT imaging of the abdomen and pelvis was performed using the standard protocol following bolus administration of intravenous contrast. CONTRAST:  159mL OMNIPAQUE IOHEXOL 300 MG/ML  SOLN COMPARISON:  12/28/2018 abdominal ultrasound from [place]. CT of 05/18/2016, report only available and reviewed. A CT from [place] of 06/24/2015 is reviewed. FINDINGS: Lower chest: Clear lung bases. Normal heart size without pericardial or pleural effusion. Right coronary artery atherosclerosis. Hepatobiliary: Mild cirrhosis, as evidenced by irregular hepatic capsule. No focal liver lesion. Normal gallbladder, without biliary ductal dilatation. Pancreas: Normal, without mass or ductal dilatation. Spleen: Normal in size, without focal abnormality. Adrenals/Urinary Tract: Normal adrenal glands. Mild renal cortical thinning bilaterally. Interpolar left renal 1.6 cm cyst. Left renal collecting system calculi of up to 4 mm. Normal urinary bladder. Stomach/Bowel: Normal stomach, without wall thickening. Surgical changes at the rectosigmoid junction. Normal caliber of the colon. Normal terminal ileum. Moderately distended proximal and mid small bowel loops, fluid-filled. This continues to the level of a transition within the mid ileum,  including on 58/2. There may be a second adjacent transition identified just lateral to this. minimal edema within subtending mesentery of dilated bowel loops, including on 62/2. Vascular/Lymphatic: Aortic and branch vessel atherosclerosis. Infrarenal aortic dilatation of maximally 3.1 cm. No abdominopelvic adenopathy. Reproductive: Normal prostate. Other: Trace fluid within the cul-de-sac, including on 74/2. No free intraperitoneal air. Musculoskeletal: Moderate compression deformity  involving the L1 vertebral body, described on 05/29/2019 radiograph. IMPRESSION: 1. Mid to distal small bowel obstruction, presumably secondary to adhesions. Possible second adjacent transition point, as can be seen with close loop obstruction. Consider surgical consultation. 2. Although there are no specific signs of complicating ischemia, there is trace pelvic fluid and edema/fluid within the subtending small-bowel mesentery. 3. Left nephrolithiasis. 4. Coronary artery atherosclerosis. Aortic Atherosclerosis (ICD10-I70.0). 5. Mild cirrhosis. Electronically Signed   By: Abigail Miyamoto M.D.   On: 07/22/2019 20:20   DG Abd Portable 1V-Small Bowel Obstruction Protocol-initial, 8 hr delay  Result Date: 07/23/2019 CLINICAL DATA:  Check small-bowel obstruction EXAM: PORTABLE ABDOMEN - 1 VIEW COMPARISON:  Film from earlier in the same day. FINDINGS: Contrast material remains in the bladder from prior CT examination. Multiple dilated loops of small bowel are noted. Contrast from the stomach now lies predominately within the jejunum. No definitive colonic contrast is seen. Continued follow-up is recommended. IMPRESSION: Persistent small-bowel obstruction with contrast material throughout the jejunum. No definitive colonic contrast is seen at this time. Continued follow-up is recommended. Electronically Signed   By: Inez Catalina M.D.   On: 07/23/2019 20:24   DG Abd Portable 1V-Small Bowel Protocol-Position Verification  Result Date:  07/23/2019 CLINICAL DATA:  NG tube placement.  Small bowel obstruction. EXAM: PORTABLE ABDOMEN - 1 VIEW COMPARISON:  CT abdomen and pelvis 07/22/2019. FINDINGS: NG tube is in place with sideport just proximal to the EG junction. Recommend advancement of 3-4 cm. No radio-opaque calculi or other significant radiographic abnormality are seen. IMPRESSION: NG tube sideport is at the gastroesophageal junction. Recommend advancement of 3-4 cm. Electronically Signed   By: Inge Rise M.D.   On: 07/23/2019 13:09   ECHOCARDIOGRAM COMPLETE  Result Date: 07/23/2019    ECHOCARDIOGRAM REPORT   Patient Name:   FERMIN Richison Date of Exam: 07/23/2019 Medical Rec #:  [id number]     Height:       73.0 in Accession #:    [id number]    Weight:       203.3 lb Date of Birth:  1945-04-23    BSA:          2.166 m Patient Age:    74 years      BP:           89/54 mmHg Patient Gender: M             HR:           105 bpm. Exam Location:  Inpatient Procedure: 2D Echo, Cardiac Doppler and Color Doppler Indications:    Atrial Fibrillation 427.31 / I48.91  History:        Patient has no prior history of Echocardiogram examinations.                 Arrythmias:Atrial Fibrillation; Risk Factors:Current Smoker.  Sonographer:    Vickie Epley RDCS Referring Phys: [id number] RAVI [place]  1. Left ventricular ejection fraction, by estimation, is 55 to 60%. The left ventricle has normal function. The left ventricle has no regional wall motion abnormalities. Left ventricular diastolic parameters are indeterminate.  2. Right ventricular systolic function is normal. The right ventricular size is mildly enlarged. There is mildly elevated pulmonary artery systolic pressure. The estimated right ventricular systolic pressure is 98.9 mmHg.  3. The mitral valve is normal in structure. Trivial mitral valve regurgitation. No evidence of mitral stenosis.  4. The aortic valve is tricuspid. Aortic valve regurgitation is not visualized. No aortic stenosis is  present.  5. The inferior vena cava is normal in size with greater than 50% respiratory variability, suggesting right atrial pressure of 3 mmHg.  6. The patient was in atrial fibrillation. FINDINGS  Left Ventricle: Left ventricular ejection fraction, by estimation, is 55 to 60%. The left ventricle has normal function. The left ventricle has no regional wall motion abnormalities. The left ventricular internal cavity size was normal in size. There is  no left ventricular hypertrophy. Left ventricular diastolic parameters are indeterminate. Right Ventricle: The right ventricular size is mildly enlarged. No increase in right ventricular wall thickness. Right ventricular systolic function is normal. There is mildly elevated pulmonary artery systolic pressure. The tricuspid regurgitant velocity is 2.90 m/s, and with an assumed right atrial pressure of 3 mmHg, the estimated right ventricular systolic pressure is 24.0 mmHg. Left Atrium: Left atrial size was normal in size. Right Atrium: Right atrial size was normal in size. Pericardium: There is no evidence of pericardial effusion. Mitral Valve: The mitral valve is normal in structure. Trivial mitral valve regurgitation. No evidence of mitral valve stenosis. Tricuspid Valve: The tricuspid valve is normal in structure. Tricuspid valve regurgitation is trivial. Aortic Valve: The aortic valve is tricuspid. Aortic valve regurgitation is not visualized. No aortic stenosis is present. Pulmonic Valve: The pulmonic valve was normal in structure. Pulmonic valve regurgitation is not visualized. Aorta: The aortic root is normal in size and structure. Venous: The inferior vena cava is normal in size with greater than 50% respiratory variability, suggesting right atrial pressure of 3 mmHg. IAS/Shunts: No atrial level shunt detected by color flow Doppler.  LEFT VENTRICLE PLAX 2D LVIDd:         4.60 cm LVIDs:         4.20 cm LV PW:         0.90 cm LV IVS:        0.90 cm LVOT diam:      1.90 cm LV SV:         41 LV SV Index:   19 LVOT Area:     2.84 cm  LV Volumes (MOD) LV vol d, MOD A2C: 67.5 ml LV vol d, MOD A4C: 82.3 ml LV vol s, MOD A2C: 38.7 ml LV vol s, MOD A4C: 43.3 ml LV SV MOD A2C:     28.8 ml LV SV MOD A4C:     82.3 ml LV SV MOD BP:      32.8 ml RIGHT VENTRICLE TAPSE (M-mode): 1.7 cm LEFT ATRIUM             Index       RIGHT ATRIUM           Index LA diam:        4.00 cm 1.85 cm/m  RA Area:     16.40 cm LA Vol (A2C):   28.6 ml 13.20 ml/m RA Volume:   38.90 ml  17.96 ml/m LA Vol (A4C):   39.8 ml 18.37 ml/m LA Biplane Vol: 35.2 ml 16.25 ml/m  AORTIC VALVE LVOT Vmax:   84.20 cm/s LVOT Vmean:  53.600 cm/s LVOT VTI:    0.145 m  AORTA Ao Root diam: 3.50 cm TRICUSPID VALVE TR Peak grad:   33.6 mmHg TR Vmax:        290.00 cm/s  SHUNTS Systemic VTI:  0.14 m Systemic Diam: 1.90 cm Loralie Champagne MD Electronically signed by Loralie Champagne MD Signature Date/Time: 07/23/2019/6:59:20 PM    Final      Cardiac Studies  ECHO:  07/22/2019 1. Left ventricular ejection fraction, by estimation, is 55 to 60%. The  left ventricle has normal function. The left ventricle has no regional  wall motion abnormalities. Left ventricular diastolic parameters are  indeterminate.  2. Right ventricular systolic function is normal. The right ventricular  size is mildly enlarged. There is mildly elevated pulmonary artery  systolic pressure. The estimated right ventricular systolic pressure is  48.5 mmHg.  3. The mitral valve is normal in structure. Trivial mitral valve  regurgitation. No evidence of mitral stenosis.  4. The aortic valve is tricuspid. Aortic valve regurgitation is not  visualized. No aortic stenosis is present.  5. The inferior vena cava is normal in size with greater than 50%  respiratory variability, suggesting right atrial pressure of 3 mmHg.  6. The patient was in atrial fibrillation.   Patient Profile     74 y.o. 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  Assessment & Plan    1. SBO - still w/ sx, CCS managing  2. Atrial fib, RVR - rate appropriate for level of illness - on IV Cardizem, now 15 mg/hr  3. COPD - wheezing has improved w/ nebs, still on O2  Otherwise, per IM Principal Problem:   SBO (small bowel obstruction) (HCC) Active Problems:   A-fib ([place])    Signed, Rosaria Ferries , PA-C 7:52 AM 07/25/2019 Pager: [phone]  Patient seen and examined.  Agree with above documentation.  On exam, patient is somnolent but arousable, irregular  rhythm, normal rate, no murmurs, lungs with diffuse rhonchi, no LE edema.  Taken to the OR for small bowel obstruction today, underwent exploratory laparotomy.   Telemetry shows atrial fibrillation with rates 80s to 110s, PVCs.  Continue cardizem gtt as recovers from procedure, can switch to p.o. meds once no longer NPO.  Restart anticoagulation once able to per surgery.  Donato Heinz, MD

## 2019-07-25 NOTE — Interval H&P Note (Signed)
History and Physical Interval Note:  07/25/2019 11:56 AM  Jake [name]  has presented today for surgery, with the diagnosis of small bowel obstruction.  The various methods of treatment have been discussed with the patient and family. After consideration of risks, benefits and other options for treatment, the patient has consented to  Procedure(s): EXPLORATORY LAPAROTOMY POSSIBLE BOWEL RESECTION (N/A) as a surgical intervention.  The patient's history has been reviewed, patient examined, no change in status, stable for surgery.  I have reviewed the patient's chart and labs.  Questions were answered to the patient's satisfaction.     [name]

## 2019-07-25 NOTE — TOC Initial Note (Signed)
Transition of Care [facility]) - Initial/Assessment Note    Patient Details  Name: Jake [name] MRN: [id number] Date of Birth: 1945-12-14  Transition of Care [facility]) CM/SW Contact:    Leeroy Cha, RN Phone Number: 07/25/2019, 7:49 AM  Clinical Narrative:                 FRom home has pcp will return to home, admittred with sbo, npo withg ng tube,A.fib-Iv Cardizam and iv heparin,  BUN=38 creat-1.41, on iv flds. Plan to return home once sbo resolved. Expected Discharge Plan: Home/Self Care Barriers to Discharge: Continued Medical Work up   Patient Goals and CMS Choice Patient states their goals for this hospitalization and ongoing recovery are:: to return home CMS Medicare.gov Compare Post Acute Care list provided to:: Patient    Expected Discharge Plan and Services Expected Discharge Plan: Home/Self Care       Living arrangements for the past 2 months: Single Family Home                                      Prior Living Arrangements/Services Living arrangements for the past 2 months: Single Family Home Lives with:: Spouse Patient language and need for interpreter reviewed:: No Do you feel safe going back to the place where you live?: Yes      Need for Family Participation in Patient Care: Yes (Comment)     Criminal Activity/Legal Involvement Pertinent to Current Situation/Hospitalization: No - Comment as needed  Activities of Daily Living Home Assistive Devices/Equipment: Cane (specify quad or straight) ADL Screening (condition at time of admission) Patient's cognitive ability adequate to safely complete daily activities?: Yes Is the patient deaf or have difficulty hearing?: No Does the patient have difficulty seeing, even when wearing glasses/contacts?: No Does the patient have difficulty concentrating, remembering, or making decisions?: No Patient able to express need for assistance with ADLs?: Yes Does the patient have difficulty dressing or bathing?:  No Independently performs ADLs?: Yes (appropriate for developmental age) Does the patient have difficulty walking or climbing stairs?: Yes Weakness of Legs: None Weakness of Arms/Hands: None  Permission Sought/Granted                  Emotional Assessment Appearance:: Appears stated age Attitude/Demeanor/Rapport: Engaged Affect (typically observed): Calm Orientation: : Oriented to Self, Oriented to Place, Oriented to  Time, Oriented to Situation Alcohol / Substance Use: Not Applicable Psych Involvement: No (comment)  Admission diagnosis:  SBO (small bowel obstruction) ([place]) [K56.609] Patient Active Problem List   Diagnosis Date Noted  . A-fib ([place]) 07/23/2019  . 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 SBO (small bowel obstruction) ([place]) 07/22/2019  . Plantar fasciitis of left foot 06/03/2015  . 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

## 2019-07-25 NOTE — Anesthesia Postprocedure Evaluation (Signed)
Anesthesia Post Note  Patient: Jake [name]  Procedure(s) Performed: Suezanne Cheshire AND ANASTOMOSIS OF SMALL BOWEL, ENTEROLYSIS (N/A )     Patient location during evaluation: PACU Anesthesia Type: General Level of consciousness: awake and alert Pain management: pain level controlled Vital Signs Assessment: post-procedure vital signs reviewed and stable Respiratory status: spontaneous breathing, nonlabored ventilation and respiratory function stable Cardiovascular status: blood pressure returned to baseline and stable Postop Assessment: no apparent nausea or vomiting Anesthetic complications: no    Last Vitals:  Vitals:   07/25/19 1500 07/25/19 1530  BP: 121/63 124/71  Pulse: (!) 53 88  Resp: 16 14  Temp:    SpO2: 91% 93%    Last Pain:  Vitals:   07/25/19 1500  TempSrc:   PainSc: Asleep                 Moneka Mcquinn A.

## 2019-07-25 NOTE — Progress Notes (Signed)
ANTICOAGULATION CONSULT NOTE - Follow Up Consult  Pharmacy Consult for heparin Indication: atrial fibrillation  Allergies  Allergen Reactions  . Propoxyphene Nausea And Vomiting    Patient Measurements: Height: 6\' 1"  (185.4 cm) Weight: 92.2 kg (203 lb 4.2 oz) IBW/kg (Calculated) : 79.9 Heparin Dosing Weight: 92 kg  Vital Signs: Temp: 98.8 F (37.1 C) (06/04 1418) Temp Source: Oral (06/04 1212) BP: 124/71 (06/04 1530) Pulse Rate: 88 (06/04 1530)  Labs: Recent Labs    07/22/19 1854 07/23/19 0228 07/23/19 0228 07/23/19 0300 07/24/19 0036 07/24/19 0036 07/24/19 0947 07/24/19 1942 07/25/19 0200 07/25/19 0204  HGB  --  13.1   < >  --  13.0  --   --   --  12.7*  --   HCT  --  40.4  --   --  39.9  --   --   --  38.7*  --   PLT  --  209  --   --  214  --   --   --  170  --   LABPROT  --   --   --   --  13.7  --   --   --   --   --   INR  --   --   --   --  1.1  --   --   --   --   --   HEPARINUNFRC  --   --   --   --  0.15*   < > 0.13* 0.48 0.28*  --   CREATININE   < >  --   --  1.43* 1.14  --   --   --   --  1.41*  TROPONINIHS  --   --   --  24*  --   --   --   --   --   --    < > = values in this interval not displayed.    Estimated Creatinine Clearance: 51.2 mL/min (A) (by C-G formula based on SCr of 1.41 mg/dL (H)).   Assessment: Patient's a 74 y.o M with PAF (CHADS2Vasc =3), not currently on anticoagulation as outpatient.  He previously took Xarelto, but he could not afford this and was switched to aspirin.  He presented to the ED on 6/1 with c/o abd pain and constipation.  Abdominal CT showed mid to distal SBO.  He subsequently went into afib with RVR with heparin drip started on 6/2.  Patient went to the OR on 6/4 for exp lap, enterolysis, decompressive enterotomy and small bowel anastomosis.  Heparin drip was stopped at 9a on 6/4 for surgery.  CCS recom. to resume heparin drip back at 10p on 6/4 post-op.  Today, 07/25/2019: - heparin level obtained at 2a on 6/4  was subtherapeutic at 0.28 - hgb down slightly to 12.7, plts 170K  Goal of Therapy:  Heparin level 0.3-0.7 units/ml Monitor platelets by anticoagulation protocol: Yes   Plan:  - resume heparin drip at 1650 units/hr at 10p (no bolus) - check 8 hr heparin level - monitor for s/sx bleeding  Paisley Grajeda P 07/25/2019,3:41 PM

## 2019-07-25 NOTE — Progress Notes (Signed)
PROGRESS NOTE    Jake [name]  YDX:[id number] DOB: May 13, 1945 DOA: 07/22/2019 PCP: Glendon Axe, MD   Brief Narrative:   Guy Seese is a 74 y.o. male with history of hypertension rheumatoid arthritis COPD presented to the ER with complaints of abdominal pain and nausea vomiting. In the ER CT abdomen pelvis shows small bowel obstruction and on-call general surgeon Dr. Harlow Asa was consulted who requested NG tube placement and will be seeing patient in consult.  While in the ER patient went into A. fib with RVR and heart rate improved with 1 dose of IV Cardizem.  Labs show hemoglobin of 14.8 with macrocytic picture creatinine 1.3.  Covid test was negative.  General hospital service for medical management.  General surgery following.  Cardiology was consulted for A. fib and he remains on heparin as well as Cardizem drip.  Assessment & Plan:   Principal Problem:   SBO (small bowel obstruction) ([facility]) Active Problems:   A-fib ([place])   1. Small bowel obstruction: Confirmed on the CT scan.  Likely mechanical.  Surgery on board.  NG tube was placed.  Patient does not feel any better than yesterday.  Still has abdominal pain and not passing gas.  Abdomen distended.  General surgery on board.  Suspect he might end up requiring surgery.  2. A. fib new onset: Continues to be in A. fib but rate controlled.  He is on Cardizem as well as heparin drip.  Cardiology managing.  3. Essential hypertension: Blood pressure controlled since he is on Cardizem drip.  Home dose of hydrochlorothiazide and lisinopril on hold.  Continue as needed labetalol in case it is needed.  4. History of rheumatoid arthritis on methotrexate presently n.p.o.  5. Acute renal failure: Renal function improved yesterday but now has slightly elevated creatinine today.  Continue IV fluids.  6.  Hypokalemia.  3.4.  Replaced through IV.  Recheck in the morning.  7.  Acute hypoxic respiratory failure/COPD: Patient has been requiring 2  to 3 L of oxygen since last 2 to 3 days.  Did not require any oxygen at the time of admission.  He was initially wheezy.  He was started on Xopenex instead of DuoNeb due to A. fib.  He does not have any wheezes anymore and now he has reduced/diminished breath sounds.  This is likely due to atelectasis due to having abdominal distention.  I have encouraged incentive spirometry.  Continue Xopenex.  I do not think he is having acute COPD exacerbation.  DVT prophylaxis: Heparin drip   Code Status: Full Code  Family Communication:  None present at bedside.  Plan of care discussed with patient in length and he verbalized understanding and agreed with it. Patient is from: Home Disposition Plan: Home once medically stable and cleared by general surgery and cardiology Barriers to discharge: Persistent A. fib and persistent small bowel obstruction  Status is: Inpatient  Remains inpatient appropriate because:IV treatments appropriate due to intensity of illness or inability to take PO   Dispo: The patient is from: Home              Anticipated d/c is to: Home              Anticipated d/c date is: 3 days              Patient currently is not medically stable to d/c.         Estimated body mass index is 26.82 kg/m as calculated from the following:  Height as of this encounter: 6\' 1"  (1.854 m).   Weight as of this encounter: 92.2 kg.      Nutritional status:               Consultants:   General surgery  Cardiology  Procedures:   None  Antimicrobials:  Anti-infectives (From admission, onward)   Start     Dose/Rate Route Frequency Ordered Stop   07/25/19 0915  ceFAZolin (ANCEF) IVPB 2g/100 mL premix     2 g 200 mL/hr over 30 Minutes Intravenous On call 07/25/19 0908 07/26/19 0915         Subjective:  Patient seen and examined.  Still complains of abdominal pain, distention and not passing gas.  No shortness of breath or any other complaint.  Objective: Vitals:    07/25/19 0334 07/25/19 0600 07/25/19 0640 07/25/19 0800  BP:  (!) 117/55 (!) 103/43   Pulse:  67 89   Resp:  18 17   Temp: (!) 96.8 F (36 C)   98.8 F (37.1 C)  TempSrc: Axillary   Oral  SpO2:  98% 96%   Weight:      Height:        Intake/Output Summary (Last 24 hours) at 07/25/2019 1039 Last data filed at 07/25/2019 1014 Gross per 24 hour  Intake 888.66 ml  Output 1450 ml  Net -561.34 ml   Filed Weights   07/22/19 1812 07/22/19 2330  Weight: 95.3 kg 92.2 kg    Examination:  General exam: Appears calm and comfortable  Respiratory system: Globally diminished breath sounds. Respiratory effort normal. Cardiovascular system: S1 & S2 heard, irregularly irregular rate and rhythm. No JVD, murmurs, rubs, gallops or clicks. No pedal edema. Gastrointestinal system: Abdomen is distended but soft and slightly tender generalized. No organomegaly or masses felt. Normal bowel sounds heard. Central nervous system: Alert and oriented. No focal neurological deficits. Extremities: Symmetric 5 x 5 power. Skin: No rashes, lesions or ulcers.  Psychiatry: Judgement and insight appear normal. Mood & affect appropriate.   Data Reviewed: I have personally reviewed following labs and imaging studies  CBC: Recent Labs  Lab 07/22/19 1854 07/23/19 0228 07/24/19 0036 07/25/19 0200  WBC 8.0 6.8 4.7 10.1  NEUTROABS 6.4  --   --   --   HGB 14.8 13.1 13.0 12.7*  HCT 43.5 40.4 39.9 38.7*  MCV 104.1* 109.5* 109.3* 106.9*  PLT 256 209 214 161   Basic Metabolic Panel: Recent Labs  Lab 07/22/19 1854 07/23/19 0300 07/24/19 0036 07/25/19 0204  NA 140 143 143 139  K 3.5 4.7 3.6 3.4*  CL 91* 93* 94* 91*  CO2 32 33* 38* 35*  GLUCOSE 143* 110* 122* 131*  BUN 36* 38* 33* 38*  CREATININE 1.37* 1.43* 1.14 1.41*  CALCIUM 9.1 8.8* 8.7* 8.8*  MG  --  1.9 2.0  --    GFR: Estimated Creatinine Clearance: 51.2 mL/min (A) (by C-G formula based on SCr of 1.41 mg/dL (H)). Liver Function Tests: Recent  Labs  Lab 07/22/19 1854  AST 21  ALT 18  ALKPHOS 68  BILITOT 2.1*  PROT 7.4  ALBUMIN 4.4   Recent Labs  Lab 07/22/19 1854  LIPASE 19   No results for input(s): AMMONIA in the last 168 hours. Coagulation Profile: Recent Labs  Lab 07/24/19 0036  INR 1.1   Cardiac Enzymes: No results for input(s): CKTOTAL, CKMB, CKMBINDEX, TROPONINI in the last 168 hours. BNP (last 3 results) No results for input(s): PROBNP in  the last 8760 hours. HbA1C: No results for input(s): HGBA1C in the last 72 hours. CBG: Recent Labs  Lab 07/23/19 2341 07/24/19 0756 07/24/19 1539 07/24/19 2325 07/25/19 0756  GLUCAP 105* 141* 129* 128* 127*   Lipid Profile: No results for input(s): CHOL, HDL, LDLCALC, TRIG, CHOLHDL, LDLDIRECT in the last 72 hours. Thyroid Function Tests: Recent Labs    07/23/19 0300  TSH 1.224   Anemia Panel: No results for input(s): VITAMINB12, FOLATE, FERRITIN, TIBC, IRON, RETICCTPCT in the last 72 hours. Sepsis Labs: Recent Labs  Lab 07/23/19 0839 07/23/19 1041  LATICACIDVEN 1.2 1.2    Recent Results (from the past 240 hour(s))  SARS Coronavirus 2 by RT PCR (hospital order, performed in [facility] hospital lab) Nasopharyngeal Nasopharyngeal Swab     Status: None   Collection Time: 07/22/19  6:54 PM   Specimen: Nasopharyngeal Swab  Result Value Ref Range Status   SARS Coronavirus 2 NEGATIVE NEGATIVE Final    Comment: (NOTE) SARS-CoV-2 target nucleic acids are NOT DETECTED. The SARS-CoV-2 RNA is generally detectable in upper and lower respiratory specimens during the acute phase of infection. The lowest concentration of SARS-CoV-2 viral copies this assay can detect is 250 copies / mL. A negative result does not preclude SARS-CoV-2 infection and should not be used as the sole basis for treatment or other patient management decisions.  A negative result may occur with improper specimen collection / handling, submission of specimen other than nasopharyngeal  swab, presence of viral mutation(s) within the areas targeted by this assay, and inadequate number of viral copies (<250 copies / mL). A negative result must be combined with clinical observations, patient history, and epidemiological information. Fact Sheet for Patients:   StrictlyIdeas.no Fact Sheet for Healthcare Providers: BankingDealers.co.za This test is not yet approved or cleared  by the [place] FDA and has been authorized for detection and/or diagnosis of SARS-CoV-2 by FDA under an Emergency Use Authorization (EUA).  This EUA will remain in effect (meaning this test can be used) for the duration of the COVID-19 declaration under Section 564(b)(1) of the Act, 21 U.S.C. section 360bbb-3(b)(1), unless the authorization is terminated or revoked sooner. Performed at [facility], [address] [place], [place] [address]   MRSA PCR Screening     Status: None   Collection Time: 07/22/19 11:25 PM   Specimen: Nasal Mucosa; Nasopharyngeal  Result Value Ref Range Status   MRSA by PCR NEGATIVE NEGATIVE Final    Comment:        The GeneXpert MRSA Assay (FDA approved for NASAL specimens only), is one component of a comprehensive MRSA colonization surveillance program. It is not intended to diagnose MRSA infection nor to guide or monitor treatment for MRSA infections. Performed at [facility], [place] [address] [place], [place] [address]       Radiology Studies: DG Abd 1 View  Result Date: 07/25/2019 CLINICAL DATA:  Check gastric catheter placement EXAM: ABDOMEN - 1 VIEW COMPARISON:  None. FINDINGS: Gastric catheter is noted with the tip in the stomach. The proximal side port lies in the distal esophagus and should be advanced several cm. This is similar to that seen on prior exam. Multiple dilated loops of small bowel are again noted. No free air is seen. IMPRESSION: Gastric catheter as described.  This should be advanced further into the stomach. Electronically Signed   By: Inez Catalina M.D.   On: 07/25/2019 02:31   DG Abd 1 View  Result  Date: 07/24/2019 CLINICAL DATA:  Small-bowel obstruction EXAM: ABDOMEN - 1 VIEW COMPARISON:  07/23/2019 FINDINGS: Interval improvement in small bowel dilatation since yesterday. Colon is decompressed. Surgical clips in the rectum. NG tip in the gastric fundus. No acute skeletal abnormality. IMPRESSION: Interval improvement in dilated small bowel loops. NG tip in the gastric fundus. Electronically Signed   By: Franchot Gallo M.D.   On: 07/24/2019 10:11   DG Abd Portable 1V-Small Bowel Obstruction Protocol-initial, 8 hr delay  Result Date: 07/23/2019 CLINICAL DATA:  Check small-bowel obstruction EXAM: PORTABLE ABDOMEN - 1 VIEW COMPARISON:  Film from earlier in the same day. FINDINGS: Contrast material remains in the bladder from prior CT examination. Multiple dilated loops of small bowel are noted. Contrast from the stomach now lies predominately within the jejunum. No definitive colonic contrast is seen. Continued follow-up is recommended. IMPRESSION: Persistent small-bowel obstruction with contrast material throughout the jejunum. No definitive colonic contrast is seen at this time. Continued follow-up is recommended. Electronically Signed   By: Inez Catalina M.D.   On: 07/23/2019 20:24   ECHOCARDIOGRAM COMPLETE  Result Date: 07/23/2019    ECHOCARDIOGRAM REPORT   Patient Name:   NIALL Waltner Date of Exam: 07/23/2019 Medical Rec #:  [id number]     Height:       73.0 in Accession #:    [id number]    Weight:       203.3 lb Date of Birth:  [birth year]    BSA:          2.166 m Patient Age:    26 years      BP:           89/54 mmHg Patient Gender: M             HR:           105 bpm. Exam Location:  Inpatient Procedure: 2D Echo, Cardiac Doppler and Color Doppler Indications:    Atrial Fibrillation 427.31 / I48.91  History:        Patient has no prior history of Echocardiogram  examinations.                 Arrythmias:Atrial Fibrillation; Risk Factors:Current Smoker.  Sonographer:    Vickie Epley RDCS Referring Phys: [id number] [place] Bojanowski [place]  1. Left ventricular ejection fraction, by estimation, is 55 to 60%. The left ventricle has normal function. The left ventricle has no regional wall motion abnormalities. Left ventricular diastolic parameters are indeterminate.  2. Right ventricular systolic function is normal. The right ventricular size is mildly enlarged. There is mildly elevated pulmonary artery systolic pressure. The estimated right ventricular systolic pressure is 71.2 mmHg.  3. The mitral valve is normal in structure. Trivial mitral valve regurgitation. No evidence of mitral stenosis.  4. The aortic valve is tricuspid. Aortic valve regurgitation is not visualized. No aortic stenosis is present.  5. The inferior vena cava is normal in size with greater than 50% respiratory variability, suggesting right atrial pressure of 3 mmHg.  6. The patient was in atrial fibrillation. FINDINGS  Left Ventricle: Left ventricular ejection fraction, by estimation, is 55 to 60%. The left ventricle has normal function. The left ventricle has no regional wall motion abnormalities. The left ventricular internal cavity size was normal in size. There is  no left ventricular hypertrophy. Left ventricular diastolic parameters are indeterminate. Right Ventricle: The right ventricular size is mildly enlarged. No increase in right ventricular wall thickness. Right ventricular systolic function is normal. There is mildly  elevated pulmonary artery systolic pressure. The tricuspid regurgitant velocity is 2.90 m/s, and with an assumed right atrial pressure of 3 mmHg, the estimated right ventricular systolic pressure is 46.9 mmHg. Left Atrium: Left atrial size was normal in size. Right Atrium: Right atrial size was normal in size. Pericardium: There is no evidence of pericardial effusion. Mitral Valve: The  mitral valve is normal in structure. Trivial mitral valve regurgitation. No evidence of mitral valve stenosis. Tricuspid Valve: The tricuspid valve is normal in structure. Tricuspid valve regurgitation is trivial. Aortic Valve: The aortic valve is tricuspid. Aortic valve regurgitation is not visualized. No aortic stenosis is present. Pulmonic Valve: The pulmonic valve was normal in structure. Pulmonic valve regurgitation is not visualized. Aorta: The aortic root is normal in size and structure. Venous: The inferior vena cava is normal in size with greater than 50% respiratory variability, suggesting right atrial pressure of 3 mmHg. IAS/Shunts: No atrial level shunt detected by color flow Doppler.  LEFT VENTRICLE PLAX 2D LVIDd:         4.60 cm LVIDs:         4.20 cm LV PW:         0.90 cm LV IVS:        0.90 cm LVOT diam:     1.90 cm LV SV:         41 LV SV Index:   19 LVOT Area:     2.84 cm  LV Volumes (MOD) LV vol d, MOD A2C: 67.5 ml LV vol d, MOD A4C: 82.3 ml LV vol s, MOD A2C: 38.7 ml LV vol s, MOD A4C: 43.3 ml LV SV MOD A2C:     28.8 ml LV SV MOD A4C:     82.3 ml LV SV MOD BP:      32.8 ml RIGHT VENTRICLE TAPSE (M-mode): 1.7 cm LEFT ATRIUM             Index       RIGHT ATRIUM           Index LA diam:        4.00 cm 1.85 cm/m  RA Area:     16.40 cm LA Vol (A2C):   28.6 ml 13.20 ml/m RA Volume:   38.90 ml  17.96 ml/m LA Vol (A4C):   39.8 ml 18.37 ml/m LA Biplane Vol: 35.2 ml 16.25 ml/m  AORTIC VALVE LVOT Vmax:   84.20 cm/s LVOT Vmean:  53.600 cm/s LVOT VTI:    0.145 m  AORTA Ao Root diam: 3.50 cm TRICUSPID VALVE TR Peak grad:   33.6 mmHg TR Vmax:        290.00 cm/s  SHUNTS Systemic VTI:  0.14 m Systemic Diam: 1.90 cm Loralie Champagne MD Electronically signed by Loralie Champagne MD Signature Date/Time: 07/23/2019/6:59:20 PM    Final     Scheduled Meds: . chlorhexidine  15 mL Mouth Rinse BID  . Chlorhexidine Gluconate Cloth  6 each Topical Daily  . levalbuterol  0.63 mg Nebulization TID  . mouth rinse  15 mL  Mouth Rinse BID   Continuous Infusions: .  ceFAZolin (ANCEF) IV    . diltiazem (CARDIZEM) infusion 15 mg/hr (07/25/19 1014)  . heparin Stopped (07/25/19 6295)  . lactated ringers 50 mL/hr at 07/25/19 1014  . potassium chloride 10 mEq (07/25/19 1014)     LOS: 3 days   Time spent: 31 minutes   Darliss Cheney, MD [facility]  07/25/2019, 10:39 AM   To contact the attending  provider between 7A-7P or the covering provider during after hours 7P-7A, please log into the web site [email or web address].

## 2019-07-26 LAB — GLUCOSE, CAPILLARY
Glucose-Capillary: 112 mg/dL — ABNORMAL HIGH (ref 70–99)
Glucose-Capillary: 134 mg/dL — ABNORMAL HIGH (ref 70–99)
Glucose-Capillary: 148 mg/dL — ABNORMAL HIGH (ref 70–99)

## 2019-07-26 LAB — BASIC METABOLIC PANEL
Anion gap: 12 (ref 5–15)
BUN: 42 mg/dL — ABNORMAL HIGH (ref 8–23)
CO2: 29 mmol/L (ref 22–32)
Calcium: 8.1 mg/dL — ABNORMAL LOW (ref 8.9–10.3)
Chloride: 95 mmol/L — ABNORMAL LOW (ref 98–111)
Creatinine, Ser: 1.39 mg/dL — ABNORMAL HIGH (ref 0.61–1.24)
GFR calc Af Amer: 57 mL/min — ABNORMAL LOW (ref >60)
GFR calc non Af Amer: 49 mL/min — ABNORMAL LOW (ref >60)
Glucose, Bld: 147 mg/dL — ABNORMAL HIGH (ref 70–99)
Potassium: 3.8 mmol/L (ref 3.5–5.1)
Sodium: 136 mmol/L (ref 135–145)

## 2019-07-26 LAB — CBC
HCT: 34.6 % — ABNORMAL LOW (ref 39.0–52.0)
Hemoglobin: 11.8 g/dL — ABNORMAL LOW (ref 13.0–17.0)
MCH: 36.2 pg — ABNORMAL HIGH (ref 26.0–34.0)
MCHC: 34.1 g/dL (ref 30.0–36.0)
MCV: 106.1 fL — ABNORMAL HIGH (ref 80.0–100.0)
Platelets: 142 10*3/uL — ABNORMAL LOW (ref 150–400)
RBC: 3.26 MIL/uL — ABNORMAL LOW (ref 4.22–5.81)
RDW: 13.5 % (ref 11.5–15.5)
WBC: 7.8 10*3/uL (ref 4.0–10.5)
nRBC: 0 % (ref 0.0–0.2)

## 2019-07-26 LAB — HEPARIN LEVEL (UNFRACTIONATED)
Heparin Unfractionated: 0.3 IU/mL (ref 0.30–0.70)
Heparin Unfractionated: 0.26 IU/mL — ABNORMAL LOW (ref 0.30–0.70)

## 2019-07-26 MED ORDER — IPRATROPIUM BROMIDE 0.02 % IN SOLN
0.5000 mg | Freq: Three times a day (TID) | RESPIRATORY_TRACT | Status: DC
  Administered 2019-07-26 – 2019-07-28 (×7): 0.5 mg via RESPIRATORY_TRACT
  Filled 2019-07-26 (×7): qty 2.5

## 2019-07-26 MED ORDER — POLYETHYLENE GLYCOL 3350 17 G PO PACK: 17.0000 g | PACK | Freq: Every day | ORAL | Status: DC | PRN

## 2019-07-26 MED ORDER — DILTIAZEM HCL 60 MG PO TABS
60.0000 mg | ORAL_TABLET | Freq: Four times a day (QID) | ORAL | Status: DC
  Administered 2019-07-26 – 2019-07-27 (×4): 60 mg via ORAL
  Filled 2019-07-26 (×4): qty 1

## 2019-07-26 MED ORDER — METHOCARBAMOL 500 MG PO TABS
1000.0000 mg | ORAL_TABLET | Freq: Three times a day (TID) | ORAL | Status: DC | PRN
  Administered 2019-07-26 – 2019-08-03 (×2): 1000 mg via ORAL
  Filled 2019-07-26 (×2): qty 2

## 2019-07-26 MED ORDER — SENNOSIDES-DOCUSATE SODIUM 8.6-50 MG PO TABS: 2.0000 | ORAL_TABLET | Freq: Every evening | ORAL | Status: DC | PRN

## 2019-07-26 MED ORDER — HEPARIN (PORCINE) 25000 UT/250ML-% IV SOLN
2200.0000 [IU]/h | INTRAVENOUS | Status: AC
  Administered 2019-07-26: 1800 [IU]/h via INTRAVENOUS
  Administered 2019-07-27: 2000 [IU]/h via INTRAVENOUS
  Administered 2019-07-27: 1800 [IU]/h via INTRAVENOUS
  Administered 2019-07-28: 2000 [IU]/h via INTRAVENOUS
  Administered 2019-07-28: 2200 [IU]/h via INTRAVENOUS
  Filled 2019-07-26 (×3): qty 250

## 2019-07-26 MED ORDER — METOPROLOL TARTRATE 5 MG/5ML IV SOLN
5.0000 mg | Freq: Once | INTRAVENOUS | Status: AC | PRN
  Administered 2019-07-27: 5 mg via INTRAVENOUS
  Filled 2019-07-26 (×2): qty 5

## 2019-07-26 NOTE — Progress Notes (Signed)
PROGRESS NOTE    Jake [name]  FYB:[id number] DOB: 02/16/1946 DOA: 07/22/2019 PCP: Glendon Axe, MD   Brief Narrative:  74 y.o.malewithhistory of hypertension rheumatoid arthritis, AAA without rupture, hepatitis C COPD presented to the ER with complaints of abdominal pain and nausea vomiting. In the ER CT abdomen pelvis shows small bowel obstruction and on-call general surgeon Dr. Harlow Asa was consulted who requested NG tube placement and will be seeing patient in consult.  Initially failed medical management therefore surgical intervention was required of 6/4.  Hospital course also complicated by atrial fibrillation with RVR, cardiology consulted.    Assessment & Plan:   Principal Problem:   SBO (small bowel obstruction) (HCC) Active Problems:   A-fib (HCC)  Small bowel obstruction; mechanical Status post ex lap/enterolysis, decompressive enterotomy 6/4 Failed medical management therefore require surgical intervention on 6/4 Advance diet as tolerated per surgery team.  Atrial fibrillation with RVR, new onset Echocardiogram shows EF of 55-60% Monitor electrolytes, cardiology following.  Cardizem 60 mg every 6 hours Start anticoagulation remains cleared by general surgery.  Eventual plans to be on Coumadin.  Essential hypertension Home HCTZ and lisinopril on hold Will let cardiology determine rate control therapy which should also help with his blood pressure  History of rheumatoid arthritis on methotrexate presently n.p.o.  Mild renal insufficiency, baseline creatinine 1.1 -Creatinine peaked at 1.4 today.  Today is 1.39.  Acute hypoxic respiratory failure/COPD:  Supplemental oxygen as needed, bronchodilators Xopenex. Add Ipratropium; IS/Flutter  DVT prophylaxis: Full dose anticoagulation to be restarted once cleared by general surgery Code Status: Full code Family Communication:   Status is: Inpatient  Remains inpatient appropriate because:IV treatments appropriate  due to intensity of illness or inability to take PO   Dispo: The patient is from: Home              Anticipated d/c is to: Home              Anticipated d/c date is: 2 days              Patient currently is not medically stable to d/c.  Patient stable.,  Currently awaiting bowel function to return.  Advance diet per general surgery.  In the meantime working on rate control therapy.    Subjective: Seen and examined at bedside.  Abdomen is still tender to touch but improving.  Review of Systems Otherwise negative except as per HPI, including: General: Denies fever, chills, night sweats or unintended weight loss. Resp: Denies cough, wheezing, shortness of breath. Cardiac: Denies chest pain, palpitations, orthopnea, paroxysmal nocturnal dyspnea. GI: Denies nausea, vomiting, diarrhea or constipation GU: Denies dysuria, frequency, hesitancy or incontinence MS: Denies muscle aches, joint pain or swelling Neuro: Denies headache, neurologic deficits (focal weakness, numbness, tingling), abnormal gait Psych: Denies anxiety, depression, SI/HI/AVH Skin: Denies new rashes or lesions ID: Denies sick contacts, exotic exposures, travel  Examination:  General exam: Appears calm and comfortable  Respiratory system: Clear to auscultation. Respiratory effort normal. Cardiovascular system: S1 & S2 heard, RRR. No JVD, murmurs, rubs, gallops or clicks. No pedal edema. Gastrointestinal system: Surgical site noted without any evidence of active bleeding or infection.  Tender to deep palpation. Central nervous system: Alert and oriented. No focal neurological deficits. Extremities: Symmetric 5 x 5 power. Skin: No rashes, lesions or ulcers Psychiatry: Judgement and insight appear normal. Mood & affect appropriate.     Objective: Vitals:   07/26/19 0300 07/26/19 0400 07/26/19 0500 07/26/19 0600  BP: (!) 113/56 (!) 102/46 119/78  111/60  Pulse: 90 83 (!) 125 88  Resp: 11 11 16  (!) 9  Temp:  (!) 97.5  F (36.4 C)    TempSrc:  Oral    SpO2: 100% 98% 98% 100%  Weight:      Height:        Intake/Output Summary (Last 24 hours) at 07/26/2019 0735 Last data filed at 07/26/2019 6720 Gross per 24 hour  Intake 3552.94 ml  Output 1675 ml  Net 1877.94 ml   Filed Weights   07/22/19 1812 07/22/19 2330  Weight: 95.3 kg 92.2 kg     Data Reviewed:   CBC: Recent Labs  Lab 07/22/19 1854 07/23/19 0228 07/24/19 0036 07/25/19 0200 07/26/19 0542  WBC 8.0 6.8 4.7 10.1 7.8  NEUTROABS 6.4  --   --   --   --   HGB 14.8 13.1 13.0 12.7* 11.8*  HCT 43.5 40.4 39.9 38.7* 34.6*  MCV 104.1* 109.5* 109.3* 106.9* 106.1*  PLT 256 209 214 170 947*   Basic Metabolic Panel: Recent Labs  Lab 07/22/19 1854 07/23/19 0300 07/24/19 0036 07/25/19 0204 07/26/19 0542  NA 140 143 143 139 136  K 3.5 4.7 3.6 3.4* 3.8  CL 91* 93* 94* 91* 95*  CO2 32 33* 38* 35* 29  GLUCOSE 143* 110* 122* 131* 147*  BUN 36* 38* 33* 38* 42*  CREATININE 1.37* 1.43* 1.14 1.41* 1.39*  CALCIUM 9.1 8.8* 8.7* 8.8* 8.1*  MG  --  1.9 2.0 1.7  --    GFR: Estimated Creatinine Clearance: 51.9 mL/min (A) (by C-G formula based on SCr of 1.39 mg/dL (H)). Liver Function Tests: Recent Labs  Lab 07/22/19 1854  AST 21  ALT 18  ALKPHOS 68  BILITOT 2.1*  PROT 7.4  ALBUMIN 4.4   Recent Labs  Lab 07/22/19 1854  LIPASE 19   No results for input(s): AMMONIA in the last 168 hours. Coagulation Profile: Recent Labs  Lab 07/24/19 0036  INR 1.1   Cardiac Enzymes: No results for input(s): CKTOTAL, CKMB, CKMBINDEX, TROPONINI in the last 168 hours. BNP (last 3 results) No results for input(s): PROBNP in the last 8760 hours. HbA1C: No results for input(s): HGBA1C in the last 72 hours. CBG: Recent Labs  Lab 07/24/19 1539 07/24/19 2325 07/25/19 0756 07/25/19 1657 07/26/19 0022  GLUCAP 129* 128* 127* 138* 148*   Lipid Profile: No results for input(s): CHOL, HDL, LDLCALC, TRIG, CHOLHDL, LDLDIRECT in the last 72  hours. Thyroid Function Tests: No results for input(s): TSH, T4TOTAL, FREET4, T3FREE, THYROIDAB in the last 72 hours. Anemia Panel: No results for input(s): VITAMINB12, FOLATE, FERRITIN, TIBC, IRON, RETICCTPCT in the last 72 hours. Sepsis Labs: Recent Labs  Lab 07/23/19 0839 07/23/19 1041  LATICACIDVEN 1.2 1.2    Recent Results (from the past 240 hour(s))  SARS Coronavirus 2 by RT PCR (hospital order, performed in [facility] hospital lab) Nasopharyngeal Nasopharyngeal Swab     Status: None   Collection Time: 07/22/19  6:54 PM   Specimen: Nasopharyngeal Swab  Result Value Ref Range Status   SARS Coronavirus 2 NEGATIVE NEGATIVE Final    Comment: (NOTE) SARS-CoV-2 target nucleic acids are NOT DETECTED. The SARS-CoV-2 RNA is generally detectable in upper and lower respiratory specimens during the acute phase of infection. The lowest concentration of SARS-CoV-2 viral copies this assay can detect is 250 copies / mL. A negative result does not preclude SARS-CoV-2 infection and should not be used as the sole basis for treatment or other patient management  decisions.  A negative result may occur with improper specimen collection / handling, submission of specimen other than nasopharyngeal swab, presence of viral mutation(s) within the areas targeted by this assay, and inadequate number of viral copies (<250 copies / mL). A negative result must be combined with clinical observations, patient history, and epidemiological information. Fact Sheet for Patients:   StrictlyIdeas.no Fact Sheet for Healthcare Providers: BankingDealers.co.za This test is not yet approved or cleared  by the [place] FDA and has been authorized for detection and/or diagnosis of SARS-CoV-2 by FDA under an Emergency Use Authorization (EUA).  This EUA will remain in effect (meaning this test can be used) for the duration of the COVID-19 declaration under Section  564(b)(1) of the Act, 21 U.S.C. section 360bbb-3(b)(1), unless the authorization is terminated or revoked sooner. Performed at [facility], [address] [place], [place] [address]   MRSA PCR Screening     Status: None   Collection Time: 07/22/19 11:25 PM   Specimen: Nasal Mucosa; Nasopharyngeal  Result Value Ref Range Status   MRSA by PCR NEGATIVE NEGATIVE Final    Comment:        The GeneXpert MRSA Assay (FDA approved for NASAL specimens only), is one component of a comprehensive MRSA colonization surveillance program. It is not intended to diagnose MRSA infection nor to guide or monitor treatment for MRSA infections. Performed at [facility], [place] [address], [place], [place] [address]          Radiology Studies: DG Abd 1 View  Result Date: 07/25/2019 CLINICAL DATA:  Check gastric catheter placement EXAM: ABDOMEN - 1 VIEW COMPARISON:  None. FINDINGS: Gastric catheter is noted with the tip in the stomach. The proximal side port lies in the distal esophagus and should be advanced several cm. This is similar to that seen on prior exam. Multiple dilated loops of small bowel are again noted. No free air is seen. IMPRESSION: Gastric catheter as described. This should be advanced further into the stomach. Electronically Signed   By: Inez Catalina M.D.   On: 07/25/2019 02:31        Scheduled Meds: . chlorhexidine  15 mL Mouth Rinse BID  . Chlorhexidine Gluconate Cloth  6 each Topical Daily  . levalbuterol  0.63 mg Nebulization TID  . mouth rinse  15 mL Mouth Rinse BID   Continuous Infusions: . acetaminophen Stopped (07/26/19 0630)  . diltiazem (CARDIZEM) infusion 15 mg/hr (07/25/19 1452)  . heparin 1,650 Units/hr (07/26/19 0609)  . methocarbamol (ROBAXIN) IV Stopped (07/26/19 0127)     LOS: 4 days   Time spent= 35 mins    Danyella Mcginty Arsenio Loader, MD [facility]  If 7PM-7AM, please contact night-coverage  07/26/2019, 7:35 AM

## 2019-07-26 NOTE — Progress Notes (Signed)
RN Marshay Slates Kollins Fenter titrated cardizem gtt to 10mg  per hour.

## 2019-07-26 NOTE — Progress Notes (Signed)
ANTICOAGULATION CONSULT NOTE - Follow Up Consult  Pharmacy Consult for heparin Indication: atrial fibrillation  Allergies  Allergen Reactions  . Propoxyphene Nausea And Vomiting    Patient Measurements: Height: 6\' 1"  (185.4 cm) Weight: 92.2 kg (203 lb 4.2 oz) IBW/kg (Calculated) : 79.9 Heparin Dosing Weight: 92 kg  Vital Signs: Temp: 98.4 F (36.9 C) (06/05 0843) Temp Source: Oral (06/05 0843) BP: 120/94 (06/05 0900) Pulse Rate: 91 (06/05 0900)  Labs: Recent Labs    07/24/19 0036 07/24/19 0036 07/24/19 0947 07/24/19 1942 07/25/19 0200 07/25/19 0204 07/26/19 0542 07/26/19 0742  HGB 13.0   < >  --   --  12.7*  --  11.8*  --   HCT 39.9  --   --   --  38.7*  --  34.6*  --   PLT 214  --   --   --  170  --  142*  --   LABPROT 13.7  --   --   --   --   --   --   --   INR 1.1  --   --   --   --   --   --   --   HEPARINUNFRC 0.15*  --    < > 0.48 0.28*  --   --  0.30  CREATININE 1.14  --   --   --   --  1.41* 1.39*  --    < > = values in this interval not displayed.    Estimated Creatinine Clearance: 51.9 mL/min (A) (by C-G formula based on SCr of 1.39 mg/dL (H)).   Assessment: Patient's a 74 y.o M with PAF (CHADS2Vasc =3), not currently on anticoagulation as outpatient.  He previously took Xarelto, but he could not afford this and was switched to aspirin.  He presented to the ED on 6/1 with c/o abd pain and constipation.  Abdominal CT showed mid to distal SBO.  He subsequently went into afib with RVR with heparin drip started on 6/2.  Patient went to the OR on 6/4 for exp lap, enterolysis, decompressive enterotomy and small bowel anastomosis.  Heparin drip was stopped at 9a on 6/4 for surgery.  CCS recom. to resume heparin drip back at 10p on 6/4 post-op.  Today, 07/26/2019:  HL therapeutic at low end of goal range on current IV heparin rate of 1650 units/hr  CBC OK  No reported bleeding  Goal of Therapy:  Heparin level 0.3-0.7 units/ml Monitor platelets by  anticoagulation protocol: Yes   Plan:   Continue current IV heparin rate of 1650 units/hr  Recheck heparin level at 1600 to make sure doesn't drop to subtherapeutic range  Daily CBC, heparin level  Kara Mead 07/26/2019,10:37 AM

## 2019-07-26 NOTE — Plan of Care (Signed)
  Problem: Safety: Goal: Ability to remain free from injury will improve Outcome: Progressing   Problem: Pain Managment: Goal: General experience of comfort will improve Outcome: Progressing   Problem: Elimination: Goal: Will not experience complications related to urinary retention Outcome: Progressing

## 2019-07-26 NOTE — Progress Notes (Signed)
ANTICOAGULATION CONSULT NOTE - Follow Up Consult  Pharmacy Consult for heparin Indication: atrial fibrillation  Allergies  Allergen Reactions  . Propoxyphene Nausea And Vomiting   Patient Measurements: Height: 6\' 1"  (185.4 cm) Weight: 92.2 kg (203 lb 4.2 oz) IBW/kg (Calculated) : 79.9 Heparin Dosing Weight: 92 kg  Vital Signs: Temp: 98.1 F (36.7 C) (06/05 1327) Temp Source: Oral (06/05 1327) BP: 126/62 (06/05 1430) Pulse Rate: 105 (06/05 1430)  Labs: Recent Labs    07/24/19 0036 07/24/19 0947 07/25/19 0200 07/25/19 0204 07/26/19 0542 07/26/19 0742 07/26/19 1607  HGB 13.0  --  12.7*  --  11.8*  --   --   HCT 39.9  --  38.7*  --  34.6*  --   --   PLT 214  --  170  --  142*  --   --   LABPROT 13.7  --   --   --   --   --   --   INR 1.1  --   --   --   --   --   --   HEPARINUNFRC 0.15*   < > 0.28*  --   --  0.30 0.26*  CREATININE 1.14  --   --  1.41* 1.39*  --   --    < > = values in this interval not displayed.   Estimated Creatinine Clearance: 51.9 mL/min (A) (by C-G formula based on SCr of 1.39 mg/dL (H)).  Assessment: Patient's a 74 y.o M with PAF (CHADS2Vasc =3), not currently on anticoagulation as outpatient.  He previously took Xarelto, but he could not afford this and was switched to aspirin.  He presented to the ED on 6/1 with c/o abd pain and constipation.  Abdominal CT showed mid to distal SBO.  He subsequently went into afib with RVR with heparin drip started on 6/2.  Patient went to the OR on 6/4 for exp lap, enterolysis, decompressive enterotomy and small bowel anastomosis.  Heparin drip was stopped at 9a on 6/4 for surgery.  CCS recom. to resume heparin drip back at 10p on 6/4 post-op.  Today, 07/26/2019:  8099 HL therapeutic- 0.3 at low end of goal range on Heparin 1650 units/hr  CBC OK  No reported bleeding  1600 HL decreased at 0.26 - slightly below therapeutic range  Goal of Therapy:  Heparin level 0.3-0.7 units/ml Monitor platelets by  anticoagulation protocol: Yes   Plan:   Increase Heparin rate to 1800 units/hr  Daily CBC, heparin level  Minda Ditto PharmD 07/26/2019,4:57 PM

## 2019-07-26 NOTE — Progress Notes (Addendum)
Progress Note  Patient Name: Jake [name] Date of Encounter: 07/26/2019  Primary Cardiologist:  No primary care provider on file.  Dr Claudie Leach from [facility] (assigned to him, he has seen [name], [facility])  Subjective   Denies any chest pain or SOB.  Remains in atrial fibrillation   Inpatient Medications    Scheduled Meds: . chlorhexidine  15 mL Mouth Rinse BID  . Chlorhexidine Gluconate Cloth  6 each Topical Daily  . diltiazem  60 mg Oral Q6H  . ipratropium  0.5 mg Nebulization TID  . levalbuterol  0.63 mg Nebulization TID  . mouth rinse  15 mL Mouth Rinse BID   Continuous Infusions: . acetaminophen Stopped (07/26/19 0630)  . diltiazem (CARDIZEM) infusion 15 mg/hr (07/25/19 1452)  . heparin 1,650 Units/hr (07/26/19 0609)  . methocarbamol (ROBAXIN) IV Stopped (07/26/19 0127)   PRN Meds: HYDROmorphone (DILAUDID) injection, labetalol, levalbuterol, ondansetron **OR** ondansetron (ZOFRAN) IV, polyethylene glycol, senna-docusate   Vital Signs    Vitals:   07/26/19 0400 07/26/19 0500 07/26/19 0600 07/26/19 0700  BP: (!) 102/46 119/78 111/60 (!) 123/58  Pulse: 83 (!) 125 88 91  Resp: 11 16 (!) 9 13  Temp: (!) 97.5 F (36.4 C)     TempSrc: Oral     SpO2: 98% 98% 100% 100%  Weight:      Height:        Intake/Output Summary (Last 24 hours) at 07/26/2019 0758 Last data filed at 07/26/2019 0932 Gross per 24 hour  Intake 3552.94 ml  Output 1575 ml  Net 1977.94 ml   Filed Weights   07/22/19 1812 07/22/19 2330  Weight: 95.3 kg 92.2 kg   Last Weight  Most recent update: 07/22/2019 11:56 PM   Weight  92.2 kg (203 lb 4.2 oz)           Weight change:    Telemetry    Atrial fibrillation- Personally Reviewed  ECG    None today - Personally Reviewed  Physical Exam   GEN: Well nourished, well developed in no acute distress HEENT: Normal NECK: No JVD; No carotid bruits LYMPHATICS: No lymphadenopathy CARDIAC:irregularly irregular, no murmurs, rubs,  gallops RESPIRATORY:  Clear to auscultation without rales, wheezing or rhonchi  ABDOMEN: Soft, non-tender, non-distended MUSCULOSKELETAL:  No edema; No deformity  SKIN: Warm and dry NEUROLOGIC:  Alert and oriented x 3 PSYCHIATRIC:  Normal affect   Labs    Hematology Recent Labs  Lab 07/24/19 0036 07/25/19 0200 07/26/19 0542  WBC 4.7 10.1 7.8  RBC 3.65* 3.62* 3.26*  HGB 13.0 12.7* 11.8*  HCT 39.9 38.7* 34.6*  MCV 109.3* 106.9* 106.1*  MCH 35.6* 35.1* 36.2*  MCHC 32.6 32.8 34.1  RDW 14.4 13.8 13.5  PLT 214 170 142*    Chemistry Recent Labs  Lab 07/22/19 1854 07/23/19 0300 07/24/19 0036 07/25/19 0204 07/26/19 0542  NA 140   < > 143 139 136  K 3.5   < > 3.6 3.4* 3.8  CL 91*   < > 94* 91* 95*  CO2 32   < > 38* 35* 29  GLUCOSE 143*   < > 122* 131* 147*  BUN 36*   < > 33* 38* 42*  CREATININE 1.37*   < > 1.14 1.41* 1.39*  CALCIUM 9.1   < > 8.7* 8.8* 8.1*  PROT 7.4  --   --   --   --   ALBUMIN 4.4  --   --   --   --  AST 21  --   --   --   --   ALT 18  --   --   --   --   ALKPHOS 68  --   --   --   --   BILITOT 2.1*  --   --   --   --   GFRNONAA 50*   < > >60 48* 49*  GFRAA 58*   < > >60 56* 57*  ANIONGAP 17*   < > 11 13 12    < > = values in this interval not displayed.     High Sensitivity Troponin:   Recent Labs  Lab 07/23/19 0300  TROPONINIHS 24*      BNPNo results for input(s): BNP, PROBNP in the last 168 hours.   DDimer No results for input(s): DDIMER in the last 168 hours.   Radiology    DG Abd 1 View  Result Date: 07/25/2019 CLINICAL DATA:  Check gastric catheter placement EXAM: ABDOMEN - 1 VIEW COMPARISON:  None. FINDINGS: Gastric catheter is noted with the tip in the stomach. The proximal side port lies in the distal esophagus and should be advanced several cm. This is similar to that seen on prior exam. Multiple dilated loops of small bowel are again noted. No free air is seen. IMPRESSION: Gastric catheter as described. This should be advanced  further into the stomach. Electronically Signed   By: Inez Catalina M.D.   On: 07/25/2019 02:31   DG Abd 1 View  Result Date: 07/24/2019 CLINICAL DATA:  Small-bowel obstruction EXAM: ABDOMEN - 1 VIEW COMPARISON:  07/23/2019 FINDINGS: Interval improvement in small bowel dilatation since yesterday. Colon is decompressed. Surgical clips in the rectum. NG tip in the gastric fundus. No acute skeletal abnormality. IMPRESSION: Interval improvement in dilated small bowel loops. NG tip in the gastric fundus. Electronically Signed   By: Franchot Gallo M.D.   On: 07/24/2019 10:11   DG Abdomen 1 View  Result Date: 07/22/2019 CLINICAL DATA:  NG tube placement EXAM: ABDOMEN - 1 VIEW COMPARISON:  None. FINDINGS: NG tube is in the fundus of the stomach. IMPRESSION: NG tube in the stomach. Electronically Signed   By: Rolm Baptise M.D.   On: 07/22/2019 21:23   CT ABDOMEN PELVIS W CONTRAST  Result Date: 07/22/2019 CLINICAL DATA:  Suspected bowel obstruction. Vomiting for 2 days. Hepatitis C. Hypertension. COPD. EXAM: CT ABDOMEN AND PELVIS WITH CONTRAST TECHNIQUE: Multidetector CT imaging of the abdomen and pelvis was performed using the standard protocol following bolus administration of intravenous contrast. CONTRAST:  141mL OMNIPAQUE IOHEXOL 300 MG/ML  SOLN COMPARISON:  12/28/2018 abdominal ultrasound from [place]. CT of 05/18/2016, report only available and reviewed. A CT from [place] of 06/24/2015 is reviewed. FINDINGS: Lower chest: Clear lung bases. Normal heart size without pericardial or pleural effusion. Right coronary artery atherosclerosis. Hepatobiliary: Mild cirrhosis, as evidenced by irregular hepatic capsule. No focal liver lesion. Normal gallbladder, without biliary ductal dilatation. Pancreas: Normal, without mass or ductal dilatation. Spleen: Normal in size, without focal abnormality. Adrenals/Urinary Tract: Normal adrenal glands. Mild renal cortical thinning bilaterally. Interpolar  left renal 1.6 cm cyst. Left renal collecting system calculi of up to 4 mm. Normal urinary bladder. Stomach/Bowel: Normal stomach, without wall thickening. Surgical changes at the rectosigmoid junction. Normal caliber of the colon. Normal terminal ileum. Moderately distended proximal and mid small bowel loops, fluid-filled. This continues to the level of a transition within the mid ileum, including on 58/2.  There may be a second adjacent transition identified just lateral to this. minimal edema within subtending mesentery of dilated bowel loops, including on 62/2. Vascular/Lymphatic: Aortic and branch vessel atherosclerosis. Infrarenal aortic dilatation of maximally 3.1 cm. No abdominopelvic adenopathy. Reproductive: Normal prostate. Other: Trace fluid within the cul-de-sac, including on 74/2. No free intraperitoneal air. Musculoskeletal: Moderate compression deformity involving the L1 vertebral body, described on 05/29/2019 radiograph. IMPRESSION: 1. Mid to distal small bowel obstruction, presumably secondary to adhesions. Possible second adjacent transition point, as can be seen with close loop obstruction. Consider surgical consultation. 2. Although there are no specific signs of complicating ischemia, there is trace pelvic fluid and edema/fluid within the subtending small-bowel mesentery. 3. Left nephrolithiasis. 4. Coronary artery atherosclerosis. Aortic Atherosclerosis (ICD10-I70.0). 5. Mild cirrhosis. Electronically Signed   By: Abigail Miyamoto M.D.   On: 07/22/2019 20:20   DG Abd Portable 1V-Small Bowel Obstruction Protocol-initial, 8 hr delay  Result Date: 07/23/2019 CLINICAL DATA:  Check small-bowel obstruction EXAM: PORTABLE ABDOMEN - 1 VIEW COMPARISON:  Film from earlier in the same day. FINDINGS: Contrast material remains in the bladder from prior CT examination. Multiple dilated loops of small bowel are noted. Contrast from the stomach now lies predominately within the jejunum. No definitive colonic  contrast is seen. Continued follow-up is recommended. IMPRESSION: Persistent small-bowel obstruction with contrast material throughout the jejunum. No definitive colonic contrast is seen at this time. Continued follow-up is recommended. Electronically Signed   By: Inez Catalina M.D.   On: 07/23/2019 20:24   DG Abd Portable 1V-Small Bowel Protocol-Position Verification  Result Date: 07/23/2019 CLINICAL DATA:  NG tube placement.  Small bowel obstruction. EXAM: PORTABLE ABDOMEN - 1 VIEW COMPARISON:  CT abdomen and pelvis 07/22/2019. FINDINGS: NG tube is in place with sideport just proximal to the EG junction. Recommend advancement of 3-4 cm. No radio-opaque calculi or other significant radiographic abnormality are seen. IMPRESSION: NG tube sideport is at the gastroesophageal junction. Recommend advancement of 3-4 cm. Electronically Signed   By: Inge Rise M.D.   On: 07/23/2019 13:09   ECHOCARDIOGRAM COMPLETE  Result Date: 07/23/2019    ECHOCARDIOGRAM REPORT   Patient Name:   KAMERIN Podolsky Date of Exam: 07/23/2019 Medical Rec #:  [id number]     Height:       73.0 in Accession #:    [id number]    Weight:       203.3 lb Date of Birth:  [birth year]    BSA:          2.166 m Patient Age:    74 years      BP:           89/54 mmHg Patient Gender: M             HR:           105 bpm. Exam Location:  Inpatient Procedure: 2D Echo, Cardiac Doppler and Color Doppler Indications:    Atrial Fibrillation 427.31 / I48.91  History:        Patient has no prior history of Echocardiogram examinations.                 Arrythmias:Atrial Fibrillation; Risk Factors:Current Smoker.  Sonographer:    Vickie Epley RDCS Referring Phys: [id number] RAVI [place]  1. Left ventricular ejection fraction, by estimation, is 55 to 60%. The left ventricle has normal function. The left ventricle has no regional wall motion abnormalities. Left ventricular diastolic parameters are indeterminate.  2. Right ventricular systolic function is  normal.  The right ventricular size is mildly enlarged. There is mildly elevated pulmonary artery systolic pressure. The estimated right ventricular systolic pressure is 35.0 mmHg.  3. The mitral valve is normal in structure. Trivial mitral valve regurgitation. No evidence of mitral stenosis.  4. The aortic valve is tricuspid. Aortic valve regurgitation is not visualized. No aortic stenosis is present.  5. The inferior vena cava is normal in size with greater than 50% respiratory variability, suggesting right atrial pressure of 3 mmHg.  6. The patient was in atrial fibrillation. FINDINGS  Left Ventricle: Left ventricular ejection fraction, by estimation, is 55 to 60%. The left ventricle has normal function. The left ventricle has no regional wall motion abnormalities. The left ventricular internal cavity size was normal in size. There is  no left ventricular hypertrophy. Left ventricular diastolic parameters are indeterminate. Right Ventricle: The right ventricular size is mildly enlarged. No increase in right ventricular wall thickness. Right ventricular systolic function is normal. There is mildly elevated pulmonary artery systolic pressure. The tricuspid regurgitant velocity is 2.90 m/s, and with an assumed right atrial pressure of 3 mmHg, the estimated right ventricular systolic pressure is 09.3 mmHg. Left Atrium: Left atrial size was normal in size. Right Atrium: Right atrial size was normal in size. Pericardium: There is no evidence of pericardial effusion. Mitral Valve: The mitral valve is normal in structure. Trivial mitral valve regurgitation. No evidence of mitral valve stenosis. Tricuspid Valve: The tricuspid valve is normal in structure. Tricuspid valve regurgitation is trivial. Aortic Valve: The aortic valve is tricuspid. Aortic valve regurgitation is not visualized. No aortic stenosis is present. Pulmonic Valve: The pulmonic valve was normal in structure. Pulmonic valve regurgitation is not visualized. Aorta:  The aortic root is normal in size and structure. Venous: The inferior vena cava is normal in size with greater than 50% respiratory variability, suggesting right atrial pressure of 3 mmHg. IAS/Shunts: No atrial level shunt detected by color flow Doppler.  LEFT VENTRICLE PLAX 2D LVIDd:         4.60 cm LVIDs:         4.20 cm LV PW:         0.90 cm LV IVS:        0.90 cm LVOT diam:     1.90 cm LV SV:         41 LV SV Index:   19 LVOT Area:     2.84 cm  LV Volumes (MOD) LV vol d, MOD A2C: 67.5 ml LV vol d, MOD A4C: 82.3 ml LV vol s, MOD A2C: 38.7 ml LV vol s, MOD A4C: 43.3 ml LV SV MOD A2C:     28.8 ml LV SV MOD A4C:     82.3 ml LV SV MOD BP:      32.8 ml RIGHT VENTRICLE TAPSE (M-mode): 1.7 cm LEFT ATRIUM             Index       RIGHT ATRIUM           Index LA diam:        4.00 cm 1.85 cm/m  RA Area:     16.40 cm LA Vol (A2C):   28.6 ml 13.20 ml/m RA Volume:   38.90 ml  17.96 ml/m LA Vol (A4C):   39.8 ml 18.37 ml/m LA Biplane Vol: 35.2 ml 16.25 ml/m  AORTIC VALVE LVOT Vmax:   84.20 cm/s LVOT Vmean:  53.600 cm/s LVOT VTI:    0.145 m  AORTA Ao Root diam: 3.50 cm TRICUSPID VALVE TR Peak grad:   33.6 mmHg TR Vmax:        290.00 cm/s  SHUNTS Systemic VTI:  0.14 m Systemic Diam: 1.90 cm Loralie Champagne MD Electronically signed by Loralie Champagne MD Signature Date/Time: 07/23/2019/6:59:20 PM    Final      Cardiac Studies   ECHO:  07/22/2019 1. Left ventricular ejection fraction, by estimation, is 55 to 60%. The  left ventricle has normal function. The left ventricle has no regional  wall motion abnormalities. Left ventricular diastolic parameters are  indeterminate.  2. Right ventricular systolic function is normal. The right ventricular  size is mildly enlarged. There is mildly elevated pulmonary artery  systolic pressure. The estimated right ventricular systolic pressure is  81.7 mmHg.  3. The mitral valve is normal in structure. Trivial mitral valve  regurgitation. No evidence of mitral stenosis.  4.  The aortic valve is tricuspid. Aortic valve regurgitation is not  visualized. No aortic stenosis is present.  5. The inferior vena cava is normal in size with greater than 50%  respiratory variability, suggesting right atrial pressure of 3 mmHg.  6. The patient was in atrial fibrillation.   Patient Profile     74 y.o. 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  Assessment & Plan    1. SBO - still complains of abdominal pain - s/p lab chole with enterolysis and decompressive enterotomy with SB anastamosis - surgery following  2. Atrial fib, RVR - HR controlled in the 80's - Continue Cardizem 60mg  q6 hours - Mr. Karner could not afford Xarelto and he does not remember discussing Coumadin with his PCP. - he was taking baby aspirin a few times a week. - Currently on IV Heparin gtt and recommend changing to Coumadin once ok by surgery- CHA2DS2-VASc is 3 (age x 2, HTN)  3. COPD - wheezing has improved w/ nebs, still on O2 -per TRH    Otherwise, per IM Principal Problem:   SBO (small bowel obstruction) (HCC) Active Problems:   A-fib (HCC)   Signed, Fransico Him, MD 7:58 AM 07/26/2019

## 2019-07-26 NOTE — Progress Notes (Signed)
1 Day Post-Op   Subjective/Chief Complaint: Comfortable this morning No SOB   Objective: Vital signs in last 24 hours: Temp:  [97.5 F (36.4 C)-99.1 F (37.3 C)] 97.5 F (36.4 C) (06/05 0400) Pulse Rate:  [41-129] 85 (06/05 0800) Resp:  [9-28] 13 (06/05 0800) BP: (92-141)/(43-106) 111/56 (06/05 0800) SpO2:  [87 %-100 %] 97 % (06/05 0800) Last BM Date: 07/19/19  Intake/Output from previous day: 06/04 0701 - 06/05 0700 In: 3552.9 [P.O.:360; I.V.:1809.6; IV Piggyback:1383.4] Out: 1675 [Urine:650; Blood:25] Intake/Output this shift: Total I/O In: 30.5 [I.V.:30.5] Out: -   Exam: Awake and alert Abdomen still with some distension, appropriately tender  Lab Results:  Recent Labs    07/25/19 0200 07/26/19 0542  WBC 10.1 7.8  HGB 12.7* 11.8*  HCT 38.7* 34.6*  PLT 170 142*   BMET Recent Labs    07/25/19 0204 07/26/19 0542  NA 139 136  K 3.4* 3.8  CL 91* 95*  CO2 35* 29  GLUCOSE 131* 147*  BUN 38* 42*  CREATININE 1.41* 1.39*  CALCIUM 8.8* 8.1*   PT/INR Recent Labs    07/24/19 0036  LABPROT 13.7  INR 1.1   ABG No results for input(s): PHART, HCO3 in the last 72 hours.  Invalid input(s): PCO2, PO2  Studies/Results: DG Abd 1 View  Result Date: 07/25/2019 CLINICAL DATA:  Check gastric catheter placement EXAM: ABDOMEN - 1 VIEW COMPARISON:  None. FINDINGS: Gastric catheter is noted with the tip in the stomach. The proximal side port lies in the distal esophagus and should be advanced several cm. This is similar to that seen on prior exam. Multiple dilated loops of small bowel are again noted. No free air is seen. IMPRESSION: Gastric catheter as described. This should be advanced further into the stomach. Electronically Signed   By: Inez Catalina M.D.   On: 07/25/2019 02:31    Anti-infectives: Anti-infectives (From admission, onward)   Start     Dose/Rate Route Frequency Ordered Stop   07/25/19 0915  ceFAZolin (ANCEF) IVPB 2g/100 mL premix     2 g 200 mL/hr  over 30 Minutes Intravenous On call 07/25/19 0908 07/25/19 1307      Assessment/Plan: s/p Procedure(s): EXPLORATORY LAPAROTOMY,REPAIR AND ANASTOMOSIS OF SMALL BOWEL, ENTEROLYSIS (N/A)  Stable post op Suspect he may have a prolonged ileus Continue NG to suction  LOS: 4 days    Coralie Keens 07/26/2019

## 2019-07-26 NOTE — Progress Notes (Signed)
Spoke with Dr. Winfield Cunas about decrease in DBP consistently in the 50's, HR 78. Unable to start PO cardizem at this time, due to NPO status. Agreed to pause IV cardizem until BP increases or HR increases. Will continue to assess for change in status.

## 2019-07-27 ENCOUNTER — Inpatient Hospital Stay (HOSPITAL_COMMUNITY): Payer: Medicare Other

## 2019-07-27 LAB — CBC
HCT: 34.3 % — ABNORMAL LOW (ref 39.0–52.0)
Hemoglobin: 11.3 g/dL — ABNORMAL LOW (ref 13.0–17.0)
MCH: 35.3 pg — ABNORMAL HIGH (ref 26.0–34.0)
MCHC: 32.9 g/dL (ref 30.0–36.0)
MCV: 107.2 fL — ABNORMAL HIGH (ref 80.0–100.0)
Platelets: 151 10*3/uL (ref 150–400)
RBC: 3.2 MIL/uL — ABNORMAL LOW (ref 4.22–5.81)
RDW: 13.8 % (ref 11.5–15.5)
WBC: 8.2 10*3/uL (ref 4.0–10.5)
nRBC: 0 % (ref 0.0–0.2)

## 2019-07-27 LAB — BASIC METABOLIC PANEL
Anion gap: 11 (ref 5–15)
BUN: 37 mg/dL — ABNORMAL HIGH (ref 8–23)
CO2: 32 mmol/L (ref 22–32)
Calcium: 8.6 mg/dL — ABNORMAL LOW (ref 8.9–10.3)
Chloride: 94 mmol/L — ABNORMAL LOW (ref 98–111)
Creatinine, Ser: 0.94 mg/dL (ref 0.61–1.24)
GFR calc Af Amer: >60 mL/min (ref >60)
GFR calc non Af Amer: >60 mL/min (ref >60)
Glucose, Bld: 94 mg/dL (ref 70–99)
Potassium: 3.7 mmol/L (ref 3.5–5.1)
Sodium: 137 mmol/L (ref 135–145)

## 2019-07-27 LAB — GLUCOSE, CAPILLARY
Glucose-Capillary: 85 mg/dL (ref 70–99)
Glucose-Capillary: 89 mg/dL (ref 70–99)
Glucose-Capillary: 92 mg/dL (ref 70–99)
Glucose-Capillary: 88 mg/dL (ref 70–99)
Glucose-Capillary: 90 mg/dL (ref 70–99)

## 2019-07-27 LAB — HEPARIN LEVEL (UNFRACTIONATED)
Heparin Unfractionated: 0.16 IU/mL — ABNORMAL LOW (ref 0.30–0.70)
Heparin Unfractionated: 0.39 IU/mL (ref 0.30–0.70)

## 2019-07-27 LAB — BRAIN NATRIURETIC PEPTIDE: B Natriuretic Peptide: 269.2 pg/mL — ABNORMAL HIGH (ref 0.0–100.0)

## 2019-07-27 LAB — MAGNESIUM: Magnesium: 2 mg/dL (ref 1.7–2.4)

## 2019-07-27 MED ORDER — DILTIAZEM LOAD VIA INFUSION
10.0000 mg | Freq: Once | INTRAVENOUS | Status: AC
  Administered 2019-07-27: 10 mg via INTRAVENOUS
  Filled 2019-07-27: qty 10

## 2019-07-27 MED ORDER — DILTIAZEM HCL-DEXTROSE 125-5 MG/125ML-% IV SOLN (PREMIX)
5.0000 mg/h | INTRAVENOUS | Status: DC
  Administered 2019-07-27: 5 mg/h via INTRAVENOUS
  Administered 2019-07-27 – 2019-07-28 (×2): 15 mg/h via INTRAVENOUS
  Administered 2019-07-28 (×2): 10 mg/h via INTRAVENOUS
  Administered 2019-07-29 – 2019-08-01 (×8): 15 mg/h via INTRAVENOUS
  Filled 2019-07-27 (×13): qty 125

## 2019-07-27 NOTE — Evaluation (Signed)
Physical Therapy Evaluation Patient Details Name: Jake [name] MRN: [id number] DOB: 02-27-1945 Today's Date: 07/27/2019   History of Present Illness  Pt admitted with SBO and now s/p Exploratory Laparotomy and Enterolysis Decompressive Enterotomy and Small Bowel Anastomosis.  Pt with hx of COPD, PAF, RA, AAA, and back surgery  Clinical Impression  Pt admitted as above and presenting with functional mobility limitations 2* generalized weakness, balance deficits, premorbid deconditioning, and post op abdominal pain and nausea exacerbated with activity.   Pt hopes to dc home with spouse and assist of 93 yr old grand son.    Follow Up Recommendations Home health PT    Equipment Recommendations  None recommended by PT    Recommendations for Other Services OT consult     Precautions / Restrictions Precautions Precautions: Fall Precaution Comments: NG tube in place Restrictions Weight Bearing Restrictions: No      Mobility  Bed Mobility Overal bed mobility: Needs Assistance Bed Mobility: Rolling;Sidelying to Sit;Sit to Sidelying Rolling: Mod assist Sidelying to sit: Min assist;Mod assist;+2 for physical assistance;+2 for safety/equipment     Sit to sidelying: Mod assist;+2 for physical assistance;+2 for safety/equipment General bed mobility comments: cues for log roll technique  Transfers Overall transfer level: Needs assistance Equipment used: Rolling walker (2 wheeled) Transfers: Sit to/from Stand Sit to Stand: Min assist;Mod assist;+2 physical assistance;+2 safety/equipment;From elevated surface         General transfer comment: cues for use of UEs to self assist;  Physical assist to bring wt up and fwd and to balance in initial standing  Ambulation/Gait Ambulation/Gait assistance: Min assist;+2 physical assistance;+2 safety/equipment Gait Distance (Feet): 4 Feet Assistive device: Rolling walker (2 wheeled) Gait Pattern/deviations: Step-to pattern;Decreased step  length - right;Decreased step length - left;Shuffle;Trunk flexed Gait velocity: decr   General Gait Details: pt able to side-step up side of bed only - pain limited  Stairs            Wheelchair Mobility    Modified Rankin (Stroke Patients Only)       Balance Overall balance assessment: Needs assistance Sitting-balance support: Bilateral upper extremity supported;Feet supported Sitting balance-Leahy Scale: Fair     Standing balance support: Bilateral upper extremity supported Standing balance-Leahy Scale: Poor                               Pertinent Vitals/Pain Pain Assessment: 0-10 Pain Score: 8  Pain Location: abdomen Pain Descriptors / Indicators: Aching;Grimacing;Guarding;Moaning;Sore Pain Intervention(s): Limited activity within patient's tolerance;Monitored during session;Premedicated before session    Home Living Family/patient expects to be discharged to:: Private residence Living Arrangements: Spouse/significant other;Other relatives Available Help at Discharge: Family;Available 24 hours/day Type of Home: House Home Access: Stairs to enter   [employer] of Steps: 1 Home Layout: One level Home Equipment: Walker - 2 wheels;Cane - single point Additional Comments: Pt states grand son will be assisting he and his wife who has had a foot amputated    Prior Function Level of Independence: Independent;Independent with assistive device(s)         Comments: used cane for any distance     Hand Dominance        Extremity/Trunk Assessment   Upper Extremity Assessment Upper Extremity Assessment: Generalized weakness    Lower Extremity Assessment Lower Extremity Assessment: Generalized weakness    Cervical / Trunk Assessment Cervical / Trunk Assessment: Kyphotic  Communication   Communication: No difficulties  Cognition Arousal/Alertness: Awake/alert Behavior  During Therapy: WFL for tasks assessed/performed Overall  Cognitive Status: Within Functional Limits for tasks assessed                                        General Comments      Exercises     Assessment/Plan    PT Assessment Patient needs continued PT services  PT Problem List Decreased strength;Decreased activity tolerance;Decreased balance;Decreased mobility;Decreased knowledge of use of DME;Pain;Decreased knowledge of precautions       PT Treatment Interventions DME instruction;Gait training;Stair training;Functional mobility training;Therapeutic activities;Therapeutic exercise;Patient/family education;Balance training    PT Goals (Current goals can be found in the Care Plan section)  Acute Rehab PT Goals Patient Stated Goal: Less pain, regain IND PT Goal Formulation: With patient Time For Goal Achievement: 08/10/19 Potential to Achieve Goals: Fair    Frequency Min 3X/week   Barriers to discharge        Co-evaluation               AM-PAC PT "6 Clicks" Mobility  Outcome Measure Help needed turning from your back to your side while in a flat bed without using bedrails?: A Lot Help needed moving from lying on your back to sitting on the side of a flat bed without using bedrails?: A Lot Help needed moving to and from a bed to a chair (including a wheelchair)?: A Lot Help needed standing up from a chair using your arms (e.g., wheelchair or bedside chair)?: A Lot Help needed to walk in hospital room?: A Lot Help needed climbing 3-5 steps with a railing? : Total 6 Click Score: 11    End of Session Equipment Utilized During Treatment: Gait belt Activity Tolerance: Other (comment);Patient limited by pain(nausea) Patient left: in bed;with call bell/phone within reach Nurse Communication: Mobility status PT Visit Diagnosis: Unsteadiness on feet (R26.81);Muscle weakness (generalized) (M62.81);Difficulty in walking, not elsewhere classified (R26.2);Pain Pain - part of body: (abdomen)    Time: 5374-8270 PT  Time Calculation (min) (ACUTE ONLY): 24 min   Charges:   PT Evaluation $PT Eval Low Complexity: 1 Low PT Treatments $Therapeutic Activity: 8-22 mins        Debe Coder PT Acute Rehabilitation Services Pager [phone] Office [phone]   Miran Kautzman 07/27/2019, 4:14 PM

## 2019-07-27 NOTE — Progress Notes (Addendum)
Progress Note  Patient Name: Jake [name] Date of Encounter: 07/27/2019  Primary Cardiologist:  No primary care provider on file.  Dr Jake [name] from [facility] (assigned to him, he has seen Jake [name], [facility])  Subjective   Denies any chest pain or SOB.  Remains in atrial fibrillation.  Had rapid afib last night up to the 170's  Inpatient Medications    Scheduled Meds: . chlorhexidine  15 mL Mouth Rinse BID  . Chlorhexidine Gluconate Cloth  6 each Topical Daily  . diltiazem  60 mg Oral Q6H  . ipratropium  0.5 mg Nebulization TID  . levalbuterol  0.63 mg Nebulization TID  . mouth rinse  15 mL Mouth Rinse BID   Continuous Infusions: . heparin 2,000 Units/hr (07/27/19 7902)   PRN Meds: HYDROmorphone (DILAUDID) injection, labetalol, levalbuterol, methocarbamol, ondansetron **OR** ondansetron (ZOFRAN) IV, polyethylene glycol, senna-docusate   Vital Signs    Vitals:   07/27/19 0400 07/27/19 0500 07/27/19 0600 07/27/19 0700  BP: 114/68 (!) 109/45  118/60  Pulse: (!) 101 84 (!) 120 (!) 105  Resp: (!) 26 (!) 23 (!) 23 (!) 22  Temp:      TempSrc:      SpO2: 95% 94% 92% 96%  Weight:      Height:        Intake/Output Summary (Last 24 hours) at 07/27/2019 0751 Last data filed at 07/27/2019 0600 Gross per 24 hour  Intake 1810.94 ml  Output 1500 ml  Net 310.94 ml   Filed Weights   07/22/19 1812 07/22/19 2330  Weight: 95.3 kg 92.2 kg   Last Weight  Most recent update: 07/22/2019 11:56 PM   Weight  92.2 kg (203 lb 4.2 oz)           Weight change:    Telemetry    Atrial fibrillation with RVR- Personally Reviewed  ECG    No new EKG to review- Personally Reviewed  Physical Exam   GEN: Well nourished, well developed in no acute distress HEENT: Normal NECK: No JVD; No carotid bruits LYMPHATICS: No lymphadenopathy CARDIAC:irregularly irregular and tachy, no murmurs, rubs, gallops RESPIRATORY: scattered wheezes ABDOMEN: Soft, non-tender,  non-distended MUSCULOSKELETAL:  No edema; No deformity  SKIN: Warm and dry NEUROLOGIC:  Alert and oriented x 3 PSYCHIATRIC:  Normal affect    Labs    Hematology Recent Labs  Lab 07/25/19 0200 07/26/19 0542 07/27/19 0307  WBC 10.1 7.8 8.2  RBC 3.62* 3.26* 3.20*  HGB 12.7* 11.8* 11.3*  HCT 38.7* 34.6* 34.3*  MCV 106.9* 106.1* 107.2*  MCH 35.1* 36.2* 35.3*  MCHC 32.8 34.1 32.9  RDW 13.8 13.5 13.8  PLT 170 142* 151    Chemistry Recent Labs  Lab 07/22/19 1854 07/23/19 0300 07/25/19 0204 07/26/19 0542 07/27/19 0307  NA 140   < > 139 136 137  K 3.5   < > 3.4* 3.8 3.7  CL 91*   < > 91* 95* 94*  CO2 32   < > 35* 29 32  GLUCOSE 143*   < > 131* 147* 94  BUN 36*   < > 38* 42* 37*  CREATININE 1.37*   < > 1.41* 1.39* 0.94  CALCIUM 9.1   < > 8.8* 8.1* 8.6*  PROT 7.4  --   --   --   --   ALBUMIN 4.4  --   --   --   --   AST 21  --   --   --   --  ALT 18  --   --   --   --   ALKPHOS 68  --   --   --   --   BILITOT 2.1*  --   --   --   --   GFRNONAA 50*   < > 48* 49* >60  GFRAA 58*   < > 56* 57* >60  ANIONGAP 17*   < > 13 12 11    < > = values in this interval not displayed.     High Sensitivity Troponin:   Recent Labs  Lab 07/23/19 0300  TROPONINIHS 24*      BNPNo results for input(s): BNP, PROBNP in the last 168 hours.   DDimer No results for input(s): DDIMER in the last 168 hours.   Radiology    DG Abd 1 View  Result Date: 07/25/2019 CLINICAL DATA:  Check gastric catheter placement EXAM: ABDOMEN - 1 VIEW COMPARISON:  None. FINDINGS: Gastric catheter is noted with the tip in the stomach. The proximal side port lies in the distal esophagus and should be advanced several cm. This is similar to that seen on prior exam. Multiple dilated loops of small bowel are again noted. No free air is seen. IMPRESSION: Gastric catheter as described. This should be advanced further into the stomach. Electronically Signed   By: Inez Catalina M.D.   On: 07/25/2019 02:31   DG Abd 1  View  Result Date: 07/24/2019 CLINICAL DATA:  Small-bowel obstruction EXAM: ABDOMEN - 1 VIEW COMPARISON:  07/23/2019 FINDINGS: Interval improvement in small bowel dilatation since yesterday. Colon is decompressed. Surgical clips in the rectum. NG tip in the gastric fundus. No acute skeletal abnormality. IMPRESSION: Interval improvement in dilated small bowel loops. NG tip in the gastric fundus. Electronically Signed   By: Franchot Gallo M.D.   On: 07/24/2019 10:11   DG Abd Portable 1V-Small Bowel Obstruction Protocol-initial, 8 hr delay  Result Date: 07/23/2019 CLINICAL DATA:  Check small-bowel obstruction EXAM: PORTABLE ABDOMEN - 1 VIEW COMPARISON:  Film from earlier in the same day. FINDINGS: Contrast material remains in the bladder from prior CT examination. Multiple dilated loops of small bowel are noted. Contrast from the stomach now lies predominately within the jejunum. No definitive colonic contrast is seen. Continued follow-up is recommended. IMPRESSION: Persistent small-bowel obstruction with contrast material throughout the jejunum. No definitive colonic contrast is seen at this time. Continued follow-up is recommended. Electronically Signed   By: Inez Catalina M.D.   On: 07/23/2019 20:24   DG Abd Portable 1V-Small Bowel Protocol-Position Verification  Result Date: 07/23/2019 CLINICAL DATA:  NG tube placement.  Small bowel obstruction. EXAM: PORTABLE ABDOMEN - 1 VIEW COMPARISON:  CT abdomen and pelvis 07/22/2019. FINDINGS: NG tube is in place with sideport just proximal to the EG junction. Recommend advancement of 3-4 cm. No radio-opaque calculi or other significant radiographic abnormality are seen. IMPRESSION: NG tube sideport is at the gastroesophageal junction. Recommend advancement of 3-4 cm. Electronically Signed   By: Inge Rise M.D.   On: 07/23/2019 13:09   ECHOCARDIOGRAM COMPLETE  Result Date: 07/23/2019    ECHOCARDIOGRAM REPORT   Patient Name:   Jake [name] Date of Exam:  07/23/2019 Medical Rec #:  [id number]     Height:       73.0 in Accession #:    [id number]    Weight:       203.3 lb Date of Birth:  1945/08/20    BSA:  2.166 m Patient Age:    74 years      BP:           89/54 mmHg Patient Gender: M             HR:           105 bpm. Exam Location:  Inpatient Procedure: 2D Echo, Cardiac Doppler and Color Doppler Indications:    Atrial Fibrillation 427.31 / I48.91  History:        Patient has no prior history of Echocardiogram examinations.                 Arrythmias:Atrial Fibrillation; Risk Factors:Current Smoker.  Sonographer:    Vickie Epley RDCS Referring Phys: [id number] RAVI Benton  1. Left ventricular ejection fraction, by estimation, is 55 to 60%. The left ventricle has normal function. The left ventricle has no regional wall motion abnormalities. Left ventricular diastolic parameters are indeterminate.  2. Right ventricular systolic function is normal. The right ventricular size is mildly enlarged. There is mildly elevated pulmonary artery systolic pressure. The estimated right ventricular systolic pressure is 82.9 mmHg.  3. The mitral valve is normal in structure. Trivial mitral valve regurgitation. No evidence of mitral stenosis.  4. The aortic valve is tricuspid. Aortic valve regurgitation is not visualized. No aortic stenosis is present.  5. The inferior vena cava is normal in size with greater than 50% respiratory variability, suggesting right atrial pressure of 3 mmHg.  6. The patient was in atrial fibrillation. FINDINGS  Left Ventricle: Left ventricular ejection fraction, by estimation, is 55 to 60%. The left ventricle has normal function. The left ventricle has no regional wall motion abnormalities. The left ventricular internal cavity size was normal in size. There is  no left ventricular hypertrophy. Left ventricular diastolic parameters are indeterminate. Right Ventricle: The right ventricular size is mildly enlarged. No increase in right  ventricular wall thickness. Right ventricular systolic function is normal. There is mildly elevated pulmonary artery systolic pressure. The tricuspid regurgitant velocity is 2.90 m/s, and with an assumed right atrial pressure of 3 mmHg, the estimated right ventricular systolic pressure is 56.2 mmHg. Left Atrium: Left atrial size was normal in size. Right Atrium: Right atrial size was normal in size. Pericardium: There is no evidence of pericardial effusion. Mitral Valve: The mitral valve is normal in structure. Trivial mitral valve regurgitation. No evidence of mitral valve stenosis. Tricuspid Valve: The tricuspid valve is normal in structure. Tricuspid valve regurgitation is trivial. Aortic Valve: The aortic valve is tricuspid. Aortic valve regurgitation is not visualized. No aortic stenosis is present. Pulmonic Valve: The pulmonic valve was normal in structure. Pulmonic valve regurgitation is not visualized. Aorta: The aortic root is normal in size and structure. Venous: The inferior vena cava is normal in size with greater than 50% respiratory variability, suggesting right atrial pressure of 3 mmHg. IAS/Shunts: No atrial level shunt detected by color flow Doppler.  LEFT VENTRICLE PLAX 2D LVIDd:         4.60 cm LVIDs:         4.20 cm LV PW:         0.90 cm LV IVS:        0.90 cm LVOT diam:     1.90 cm LV SV:         41 LV SV Index:   19 LVOT Area:     2.84 cm  LV Volumes (MOD) LV vol d, MOD A2C: 67.5 ml LV vol d,  MOD A4C: 82.3 ml LV vol s, MOD A2C: 38.7 ml LV vol s, MOD A4C: 43.3 ml LV SV MOD A2C:     28.8 ml LV SV MOD A4C:     82.3 ml LV SV MOD BP:      32.8 ml RIGHT VENTRICLE TAPSE (M-mode): 1.7 cm LEFT ATRIUM             Index       RIGHT ATRIUM           Index LA diam:        4.00 cm 1.85 cm/m  RA Area:     16.40 cm LA Vol (A2C):   28.6 ml 13.20 ml/m RA Volume:   38.90 ml  17.96 ml/m LA Vol (A4C):   39.8 ml 18.37 ml/m LA Biplane Vol: 35.2 ml 16.25 ml/m  AORTIC VALVE LVOT Vmax:   84.20 cm/s LVOT Vmean:   53.600 cm/s LVOT VTI:    0.145 m  AORTA Ao Root diam: 3.50 cm TRICUSPID VALVE TR Peak grad:   33.6 mmHg TR Vmax:        290.00 cm/s  SHUNTS Systemic VTI:  0.14 m Systemic Diam: 1.90 cm Loralie Champagne MD Electronically signed by Loralie Champagne MD Signature Date/Time: 07/23/2019/6:59:20 PM    Final      Cardiac Studies   ECHO:  07/22/2019 1. Left ventricular ejection fraction, by estimation, is 55 to 60%. The  left ventricle has normal function. The left ventricle has no regional  wall motion abnormalities. Left ventricular diastolic parameters are  indeterminate.  2. Right ventricular systolic function is normal. The right ventricular  size is mildly enlarged. There is mildly elevated pulmonary artery  systolic pressure. The estimated right ventricular systolic pressure is  94.0 mmHg.  3. The mitral valve is normal in structure. Trivial mitral valve  regurgitation. No evidence of mitral stenosis.  4. The aortic valve is tricuspid. Aortic valve regurgitation is not  visualized. No aortic stenosis is present.  5. The inferior vena cava is normal in size with greater than 50%  respiratory variability, suggesting right atrial pressure of 3 mmHg.  6. The patient was in atrial fibrillation.   Patient Profile     74 y.o. 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  Assessment & Plan    1. SBO - still complains of abdominal pain - s/p lab chole with enterolysis and decompressive enterotomy with SB anastamosis - surgery following  2. Atrial fib, RVR - HR now up in the 110's and as high as 170bpm last night - may not be absorbing the PO Cardizem - stop PO Cardizem and change to IV gtt - Mr. Mano could not afford Xarelto and he does not remember discussing Coumadin with his PCP. - he was taking baby aspirin a few times a  week. - Currently on IV Heparin gtt and recommend changing to Coumadin once ok by surgery- CHA2DS2-VASc is 3 (age x 2, HTN)  3. COPD - wheezing has improved w/ nebs, still on O2 -per TRH  Otherwise, per IM Principal Problem:   SBO (small bowel obstruction) (HCC) Active Problems:   A-fib (HCC)   Signed, Fransico Him, MD 7:51 AM 07/27/2019

## 2019-07-27 NOTE — Progress Notes (Signed)
Opyd, MD notified of pt's HR spike to 170. After incidence, HR returned to low 100s, but began spiking back up to the 130s. Pt showed no distress and was sleeping at time of occurrence.  Scheduled Cardizem and PRN Lopressor given. HR now in the 90s. BP stable. RN will continue to monitor.

## 2019-07-27 NOTE — Progress Notes (Signed)
PROGRESS NOTE    Jake [name]  CHE:[id number] DOB: 1945-02-24 DOA: 07/22/2019 PCP: Glendon Axe, MD   Brief Narrative:  74 y.o.malewithhistory of hypertension rheumatoid arthritis, AAA without rupture, hepatitis C COPD presented to the ER with complaints of abdominal pain and nausea vomiting. In the ER CT abdomen pelvis shows small bowel obstruction and on-call general surgeon Dr. Harlow Asa was consulted who requested NG tube placement and will be seeing patient in consult.  Initially failed medical management therefore surgical intervention was required of 6/4.  Hospital course also complicated by atrial fibrillation with RVR, cardiology consulted.    Assessment & Plan:   Principal Problem:   SBO (small bowel obstruction) (HCC) Active Problems:   A-fib (HCC)  Small bowel obstruction; mechanical Status post ex lap/enterolysis, decompressive enterotomy 6/4 Failed medical management therefore require surgical intervention on 6/4 Diet per Gen Surgery  Atrial fibrillation with RVR, new onset Echocardiogram shows EF of 55-60% Monitor electrolytes, cardiology following.  Cardizem 60 mg every 6 hours- Uptitrate her cardiology.  Heparin Drip, eventually transition to PO Coumadin when able.   Essential hypertension Home HCTZ and lisinopril on hold Currently on PO Cardizem  History of rheumatoid arthritis on methotrexate presently n.p.o.  Mild renal insufficiency, baseline creatinine 1.1 -Creatinine peaked at 1.4 today.  Today is 0.94  Acute hypoxic respiratory failure/COPD with Hypoxia Supplemental oxygen as needed, bronchodilators Xopenex. Ipratropium; IS/Flutter Check BNP and CXR, concern for Vol Overload. May need lasix.   DVT prophylaxis: Full dose anticoagulation to be restarted once cleared by general surgery Code Status: Full code Family Communication:   Status is: Inpatient  Remains inpatient appropriate because:IV treatments appropriate due to intensity of illness  or inability to take PO   Dispo: The patient is from: Home              Anticipated d/c is to: Home              Anticipated d/c date is: 2 days              Patient currently is not medically stable to d/c.  Patient stable.,  Currently awaiting bowel function to return.  Advance diet per general surgery.  In the meantime working on rate control therapy.    Subjective: Still episodes of atrial fibrillation with RVR.  Complaints of pain around surgical site but no nausea and vomiting.  Review of Systems Otherwise negative except as per HPI, including: General = no fevers, chills, dizziness,  fatigue HEENT/EYES = negative for loss of vision, double vision, blurred vision,  sore throa Cardiovascular= negative for chest pain, palpitation Respiratory/lungs= negative for shortness of breath, cough, wheezing; hemoptysis,  Gastrointestinal= negative for constipation Genitourinary= negative for Dysuria MSK = Negative for arthralgia, myalgias Neurology= Negative for headache, numbness, tingling  Psychiatry= Negative for suicidal and homocidal ideation Skin= Negative for Rash  Examination:  Constitutional: Not in acute distress; 4l [place] Respiratory: bibasilar crackles.  Cardiovascular: IRRR Abdomen: Surgical dressing on Abd. Tender to touch.  Musculoskeletal: No edema noted Skin: No rashes seen Neurologic: CN 2-12 grossly intact.  And nonfocal Psychiatric: Normal judgment and insight. Alert and oriented x 3. Normal mood.     Objective: Vitals:   07/27/19 0300 07/27/19 0400 07/27/19 0500 07/27/19 0600  BP: 102/60 114/68 (!) 109/45   Pulse: 98 (!) 101 84 (!) 120  Resp: 20 (!) 26 (!) 23 (!) 23  Temp:      TempSrc:      SpO2: 97% 95% 94% 92%  Weight:      Height:        Intake/Output Summary (Last 24 hours) at 07/27/2019 0732 Last data filed at 07/27/2019 0600 Gross per 24 hour  Intake 1810.94 ml  Output 1500 ml  Net 310.94 ml   Filed Weights   07/22/19 1812 07/22/19 2330    Weight: 95.3 kg 92.2 kg     Data Reviewed:   CBC: Recent Labs  Lab 07/22/19 1854 07/22/19 1854 07/23/19 0228 07/24/19 0036 07/25/19 0200 07/26/19 0542 07/27/19 0307  WBC 8.0   < > 6.8 4.7 10.1 7.8 8.2  NEUTROABS 6.4  --   --   --   --   --   --   HGB 14.8   < > 13.1 13.0 12.7* 11.8* 11.3*  HCT 43.5   < > 40.4 39.9 38.7* 34.6* 34.3*  MCV 104.1*   < > 109.5* 109.3* 106.9* 106.1* 107.2*  PLT 256   < > 209 214 170 142* 151   < > = values in this interval not displayed.   Basic Metabolic Panel: Recent Labs  Lab 07/23/19 0300 07/24/19 0036 07/25/19 0204 07/26/19 0542 07/27/19 0307  NA 143 143 139 136 137  K 4.7 3.6 3.4* 3.8 3.7  CL 93* 94* 91* 95* 94*  CO2 33* 38* 35* 29 32  GLUCOSE 110* 122* 131* 147* 94  BUN 38* 33* 38* 42* 37*  CREATININE 1.43* 1.14 1.41* 1.39* 0.94  CALCIUM 8.8* 8.7* 8.8* 8.1* 8.6*  MG 1.9 2.0 1.7  --  2.0   GFR: Estimated Creatinine Clearance: 76.7 mL/min (by C-G formula based on SCr of 0.94 mg/dL). Liver Function Tests: Recent Labs  Lab 07/22/19 1854  AST 21  ALT 18  ALKPHOS 68  BILITOT 2.1*  PROT 7.4  ALBUMIN 4.4   Recent Labs  Lab 07/22/19 1854  LIPASE 19   No results for input(s): AMMONIA in the last 168 hours. Coagulation Profile: Recent Labs  Lab 07/24/19 0036  INR 1.1   Cardiac Enzymes: No results for input(s): CKTOTAL, CKMB, CKMBINDEX, TROPONINI in the last 168 hours. BNP (last 3 results) No results for input(s): PROBNP in the last 8760 hours. HbA1C: No results for input(s): HGBA1C in the last 72 hours. CBG: Recent Labs  Lab 07/25/19 1657 07/26/19 0022 07/26/19 0741 07/26/19 1553 07/26/19 2329  GLUCAP 138* 148* 134* 112* 85   Lipid Profile: No results for input(s): CHOL, HDL, LDLCALC, TRIG, CHOLHDL, LDLDIRECT in the last 72 hours. Thyroid Function Tests: No results for input(s): TSH, T4TOTAL, FREET4, T3FREE, THYROIDAB in the last 72 hours. Anemia Panel: No results for input(s): VITAMINB12, FOLATE,  FERRITIN, TIBC, IRON, RETICCTPCT in the last 72 hours. Sepsis Labs: Recent Labs  Lab 07/23/19 0839 07/23/19 1041  LATICACIDVEN 1.2 1.2    Recent Results (from the past 240 hour(s))  SARS Coronavirus 2 by RT PCR (hospital order, performed in [facility] hospital lab) Nasopharyngeal Nasopharyngeal Swab     Status: None   Collection Time: 07/22/19  6:54 PM   Specimen: Nasopharyngeal Swab  Result Value Ref Range Status   SARS Coronavirus 2 NEGATIVE NEGATIVE Final    Comment: (NOTE) SARS-CoV-2 target nucleic acids are NOT DETECTED. The SARS-CoV-2 RNA is generally detectable in upper and lower respiratory specimens during the acute phase of infection. The lowest concentration of SARS-CoV-2 viral copies this assay can detect is 250 copies / mL. A negative result does not preclude SARS-CoV-2 infection and should not be used as the sole basis for  treatment or other patient management decisions.  A negative result may occur with improper specimen collection / handling, submission of specimen other than nasopharyngeal swab, presence of viral mutation(s) within the areas targeted by this assay, and inadequate number of viral copies (<250 copies / mL). A negative result must be combined with clinical observations, patient history, and epidemiological information. Fact Sheet for Patients:   StrictlyIdeas.no Fact Sheet for Healthcare Providers: BankingDealers.co.za This test is not yet approved or cleared  by the [place] FDA and has been authorized for detection and/or diagnosis of SARS-CoV-2 by FDA under an Emergency Use Authorization (EUA).  This EUA will remain in effect (meaning this test can be used) for the duration of the COVID-19 declaration under Section 564(b)(1) of the Act, 21 U.S.C. section 360bbb-3(b)(1), unless the authorization is terminated or revoked sooner. Performed at [facility], [address]  [place], [place] [address]   MRSA PCR Screening     Status: None   Collection Time: 07/22/19 11:25 PM   Specimen: Nasal Mucosa; Nasopharyngeal  Result Value Ref Range Status   MRSA by PCR NEGATIVE NEGATIVE Final    Comment:        The GeneXpert MRSA Assay (FDA approved for NASAL specimens only), is one component of a comprehensive MRSA colonization surveillance program. It is not intended to diagnose MRSA infection nor to guide or monitor treatment for MRSA infections. Performed at [facility], [place] [address] [place], [place] [address]          Radiology Studies: No results found.      Scheduled Meds:  chlorhexidine  15 mL Mouth Rinse BID   Chlorhexidine Gluconate Cloth  6 each Topical Daily   diltiazem  60 mg Oral Q6H   ipratropium  0.5 mg Nebulization TID   levalbuterol  0.63 mg Nebulization TID   mouth rinse  15 mL Mouth Rinse BID   Continuous Infusions:  heparin 2,000 Units/hr (07/27/19 3818)     LOS: 5 days   Time spent= 35 mins    Jaquitta Dupriest Arsenio Loader, MD [facility]  If 7PM-7AM, please contact night-coverage  07/27/2019, 7:32 AM

## 2019-07-27 NOTE — Progress Notes (Signed)
ANTICOAGULATION CONSULT NOTE - Follow Up Consult  Pharmacy Consult for heparin Indication: atrial fibrillation  Allergies  Allergen Reactions   Propoxyphene Nausea And Vomiting   Patient Measurements: Height: 6\' 1"  (185.4 cm) Weight: 92.2 kg (203 lb 4.2 oz) IBW/kg (Calculated) : 79.9 Heparin Dosing Weight: 92 kg  Vital Signs: Temp: 98.1 F (36.7 C) (06/06 1233) Temp Source: Oral (06/06 1233) BP: 124/73 (06/06 1500) Pulse Rate: 115 (06/06 1500)  Labs: Recent Labs    07/25/19 0200 07/25/19 0200 07/25/19 0204 07/26/19 0542 07/26/19 0742 07/26/19 1607 07/27/19 0307 07/27/19 1458  HGB 12.7*   < >  --  11.8*  --   --  11.3*  --   HCT 38.7*  --   --  34.6*  --   --  34.3*  --   PLT 170  --   --  142*  --   --  151  --   HEPARINUNFRC 0.28*  --   --   --    < > 0.26* 0.16* 0.39  CREATININE  --   --  1.41* 1.39*  --   --  0.94  --    < > = values in this interval not displayed.   Estimated Creatinine Clearance: 76.7 mL/min (by C-G formula based on SCr of 0.94 mg/dL).  Assessment: Patient's a 74 y.o M with PAF (CHADS2Vasc =3), not currently on anticoagulation as outpatient.  He previously took Xarelto, but he could not afford this and was switched to aspirin.  He presented to the ED on 6/1 with c/o abd pain and constipation.  Abdominal CT showed mid to distal SBO.  He subsequently went into afib with RVR with heparin drip started on 6/2.  Patient went to the OR on 6/4 for exp lap, enterolysis, decompressive enterotomy and small bowel anastomosis.  Heparin drip was stopped at 9a on 6/4 for surgery.  CCS recom. to resume heparin drip back at 10p on 6/4 post-op.  Today, 07/27/2019:  0300 HL 0.16 units/ml was sub-therapeutic, rate increased to 2000 units/hr  CBC OK, No reported bleeding  1500 HL decreased at 0.39, back into therapeutic range  Goal of Therapy:  Heparin level 0.3-0.7 units/ml Monitor platelets by anticoagulation protocol: Yes   Plan:   Continue Heparin  infusion at 2000 units/hr  Daily CBC, heparin level  Minda Ditto PharmD 07/27/2019,3:45 PM

## 2019-07-27 NOTE — Progress Notes (Signed)
2 Days Post-Op   Subjective/Chief Complaint: Having some cramping abdominal pain No flatus NG with 800 out last 24 hrs   Objective: Vital signs in last 24 hours: Temp:  [97.6 F (36.4 C)-99.2 F (37.3 C)] 99.2 F (37.3 C) (06/06 0000) Pulse Rate:  [43-126] 120 (06/06 0600) Resp:  [8-26] 23 (06/06 0600) BP: (102-135)/(45-94) 109/45 (06/06 0500) SpO2:  [89 %-98 %] 92 % (06/06 0600) Last BM Date: 07/19/19  Intake/Output from previous day: 06/05 0701 - 06/06 0700 In: 1810.9 [P.O.:480; I.V.:450.9; NG/GT:780; IV Piggyback:100] Out: 1500 [Urine:700; Emesis/NG output:800] Intake/Output this shift: No intake/output data recorded.  Exam: Awake and alert Abdomen distended, dressing just changed so left alone  Lab Results:  Recent Labs    07/26/19 0542 07/27/19 0307  WBC 7.8 8.2  HGB 11.8* 11.3*  HCT 34.6* 34.3*  PLT 142* 151   BMET Recent Labs    07/26/19 0542 07/27/19 0307  NA 136 137  K 3.8 3.7  CL 95* 94*  CO2 29 32  GLUCOSE 147* 94  BUN 42* 37*  CREATININE 1.39* 0.94  CALCIUM 8.1* 8.6*   PT/INR No results for input(s): LABPROT, INR in the last 72 hours. ABG No results for input(s): PHART, HCO3 in the last 72 hours.  Invalid input(s): PCO2, PO2  Studies/Results: No results found.  Anti-infectives: Anti-infectives (From admission, onward)   Start     Dose/Rate Route Frequency Ordered Stop   07/25/19 0915  ceFAZolin (ANCEF) IVPB 2g/100 mL premix     2 g 200 mL/hr over 30 Minutes Intravenous On call 07/25/19 0908 07/25/19 1307      Assessment/Plan: s/p Procedure(s): EXPLORATORY LAPAROTOMY,REPAIR AND ANASTOMOSIS OF SMALL BOWEL, ENTEROLYSIS (N/A)  Continue NPO and NG Continue dressing changes  LOS: 5 days    Coralie Keens 07/27/2019

## 2019-07-27 NOTE — Progress Notes (Signed)
ANTICOAGULATION CONSULT NOTE - Follow Up Consult  Pharmacy Consult for Heparin Indication: atrial fibrillation  Allergies  Allergen Reactions  . Propoxyphene Nausea And Vomiting    Patient Measurements: Height: 6\' 1"  (185.4 cm) Weight: 92.2 kg (203 lb 4.2 oz) IBW/kg (Calculated) : 79.9 Heparin Dosing Weight:   Vital Signs: Temp: 99.2 F (37.3 C) (06/06 0000) Temp Source: Axillary (06/06 0000) BP: 109/45 (06/06 0500) Pulse Rate: 120 (06/06 0600)  Labs: Recent Labs    07/25/19 0200 07/25/19 0200 07/25/19 0204 07/26/19 0542 07/26/19 0742 07/26/19 1607 07/27/19 0307  HGB 12.7*   < >  --  11.8*  --   --  11.3*  HCT 38.7*  --   --  34.6*  --   --  34.3*  PLT 170  --   --  142*  --   --  151  HEPARINUNFRC 0.28*   < >  --   --  0.30 0.26* 0.16*  CREATININE  --   --  1.41* 1.39*  --   --  0.94   < > = values in this interval not displayed.    Estimated Creatinine Clearance: 76.7 mL/min (by C-G formula based on SCr of 0.94 mg/dL).   Medications:  Infusions:  . heparin 1,800 Units/hr (07/27/19 0600)    Assessment: Patient with low heparin level.  No heparin issues per RN.  Goal of Therapy:  Heparin level 0.3-0.7 units/ml Monitor platelets by anticoagulation protocol: Yes   Plan:  Increase heparin to 2000 units/hr Recheck level at [place], [place] 07/27/2019,6:33 AM

## 2019-07-28 ENCOUNTER — Inpatient Hospital Stay (HOSPITAL_COMMUNITY): Payer: Medicare Other

## 2019-07-28 ENCOUNTER — Inpatient Hospital Stay: Payer: Self-pay

## 2019-07-28 DIAGNOSIS — I10 Essential (primary) hypertension: Secondary | ICD-10-CM

## 2019-07-28 DIAGNOSIS — I4891 Unspecified atrial fibrillation: Secondary | ICD-10-CM

## 2019-07-28 DIAGNOSIS — I5031 Acute diastolic (congestive) heart failure: Secondary | ICD-10-CM

## 2019-07-28 LAB — GLUCOSE, CAPILLARY
Glucose-Capillary: 90 mg/dL (ref 70–99)
Glucose-Capillary: 91 mg/dL (ref 70–99)
Glucose-Capillary: 84 mg/dL (ref 70–99)
Glucose-Capillary: 105 mg/dL — ABNORMAL HIGH (ref 70–99)

## 2019-07-28 LAB — CBC
HCT: 34.9 % — ABNORMAL LOW (ref 39.0–52.0)
Hemoglobin: 11.6 g/dL — ABNORMAL LOW (ref 13.0–17.0)
MCH: 35.5 pg — ABNORMAL HIGH (ref 26.0–34.0)
MCHC: 33.2 g/dL (ref 30.0–36.0)
MCV: 106.7 fL — ABNORMAL HIGH (ref 80.0–100.0)
Platelets: 157 10*3/uL (ref 150–400)
RBC: 3.27 MIL/uL — ABNORMAL LOW (ref 4.22–5.81)
RDW: 13.9 % (ref 11.5–15.5)
WBC: 9.9 10*3/uL (ref 4.0–10.5)
nRBC: 0 % (ref 0.0–0.2)

## 2019-07-28 LAB — BASIC METABOLIC PANEL
Anion gap: 12 (ref 5–15)
BUN: 42 mg/dL — ABNORMAL HIGH (ref 8–23)
CO2: 32 mmol/L (ref 22–32)
Calcium: 8.4 mg/dL — ABNORMAL LOW (ref 8.9–10.3)
Chloride: 91 mmol/L — ABNORMAL LOW (ref 98–111)
Creatinine, Ser: 1.12 mg/dL (ref 0.61–1.24)
GFR calc Af Amer: >60 mL/min (ref >60)
GFR calc non Af Amer: >60 mL/min (ref >60)
Glucose, Bld: 93 mg/dL (ref 70–99)
Potassium: 3.5 mmol/L (ref 3.5–5.1)
Sodium: 135 mmol/L (ref 135–145)

## 2019-07-28 LAB — MAGNESIUM: Magnesium: 2 mg/dL (ref 1.7–2.4)

## 2019-07-28 LAB — HEMOGLOBIN A1C
Hgb A1c MFr Bld: 5.2 % (ref 4.8–5.6)
Mean Plasma Glucose: 102.54 mg/dL

## 2019-07-28 LAB — PROCALCITONIN: Procalcitonin: 0.85 ng/mL

## 2019-07-28 LAB — HEPARIN LEVEL (UNFRACTIONATED): Heparin Unfractionated: 0.29 IU/mL — ABNORMAL LOW (ref 0.30–0.70)

## 2019-07-28 MED ORDER — VANCOMYCIN HCL IN DEXTROSE 1-5 GM/200ML-% IV SOLN
1000.0000 mg | Freq: Two times a day (BID) | INTRAVENOUS | Status: DC
  Administered 2019-07-28 – 2019-07-30 (×4): 1000 mg via INTRAVENOUS
  Filled 2019-07-28 (×4): qty 200

## 2019-07-28 MED ORDER — TRAVASOL 10 % IV SOLN
INTRAVENOUS | Status: DC
  Filled 2019-07-28: qty 556.8

## 2019-07-28 MED ORDER — IPRATROPIUM BROMIDE 0.02 % IN SOLN
0.5000 mg | Freq: Two times a day (BID) | RESPIRATORY_TRACT | Status: DC
  Administered 2019-07-28 – 2019-08-02 (×11): 0.5 mg via RESPIRATORY_TRACT
  Filled 2019-07-28 (×11): qty 2.5

## 2019-07-28 MED ORDER — DILTIAZEM LOAD VIA INFUSION: 15.0000 mg | Freq: Once | INTRAVENOUS | Status: DC

## 2019-07-28 MED ORDER — INSULIN ASPART 100 UNIT/ML ~~LOC~~ SOLN
0.0000 [IU] | Freq: Four times a day (QID) | SUBCUTANEOUS | Status: DC
  Administered 2019-07-29: 3 [IU] via SUBCUTANEOUS
  Administered 2019-07-29: 2 [IU] via SUBCUTANEOUS
  Administered 2019-07-29 – 2019-07-30 (×3): 3 [IU] via SUBCUTANEOUS

## 2019-07-28 MED ORDER — LEVALBUTEROL HCL 0.63 MG/3ML IN NEBU
0.6300 mg | INHALATION_SOLUTION | Freq: Two times a day (BID) | RESPIRATORY_TRACT | Status: DC
  Administered 2019-07-28 – 2019-08-02 (×10): 0.63 mg via RESPIRATORY_TRACT
  Filled 2019-07-28 (×11): qty 3

## 2019-07-28 MED ORDER — ENOXAPARIN SODIUM 100 MG/ML ~~LOC~~ SOLN
1.0000 mg/kg | Freq: Two times a day (BID) | SUBCUTANEOUS | Status: DC
  Administered 2019-07-28 – 2019-07-31 (×7): 90 mg via SUBCUTANEOUS
  Filled 2019-07-28 (×11): qty 0.9

## 2019-07-28 MED ORDER — POTASSIUM CHLORIDE 10 MEQ/100ML IV SOLN
10.0000 meq | INTRAVENOUS | Status: AC
  Administered 2019-07-28 (×4): 10 meq via INTRAVENOUS
  Filled 2019-07-28 (×4): qty 100

## 2019-07-28 MED ORDER — DILTIAZEM LOAD VIA INFUSION
10.0000 mg | Freq: Once | INTRAVENOUS | Status: AC
  Administered 2019-07-28: 10 mg via INTRAVENOUS
  Filled 2019-07-28: qty 10

## 2019-07-28 MED ORDER — FUROSEMIDE 10 MG/ML IJ SOLN
40.0000 mg | Freq: Two times a day (BID) | INTRAMUSCULAR | Status: AC
  Administered 2019-07-28 (×2): 40 mg via INTRAVENOUS
  Filled 2019-07-28 (×2): qty 4

## 2019-07-28 MED ORDER — VANCOMYCIN HCL 2000 MG/400ML IV SOLN
2000.0000 mg | Freq: Once | INTRAVENOUS | Status: AC
  Administered 2019-07-28: 2000 mg via INTRAVENOUS
  Filled 2019-07-28: qty 400

## 2019-07-28 MED ORDER — GUAIFENESIN-DM 100-10 MG/5ML PO SYRP
5.0000 mL | ORAL_SOLUTION | ORAL | Status: DC | PRN
  Administered 2019-07-28 – 2019-07-31 (×2): 5 mL via ORAL
  Filled 2019-07-28 (×2): qty 10

## 2019-07-28 MED ORDER — PIPERACILLIN-TAZOBACTAM 3.375 G IVPB
3.3750 g | Freq: Three times a day (TID) | INTRAVENOUS | Status: DC
  Administered 2019-07-28 – 2019-08-01 (×13): 3.375 g via INTRAVENOUS
  Filled 2019-07-28 (×13): qty 50

## 2019-07-28 NOTE — Progress Notes (Signed)
Patient not ambulated to chair due to need for PICC placement for TPN to begin tonight. Patient placed in chair position with bed controls. Patient practiced with RN both flutter valve and incentive spirometer. Will get up to chair as soon as able.

## 2019-07-28 NOTE — Progress Notes (Signed)
Initial Nutrition Assessment  DOCUMENTATION CODES:   Not applicable  INTERVENTION:  - TPN initiation and advancement per Pharmacist. - diet advancement as medically feasible.  Monitor magnesium, potassium, and phosphorus daily for at least 3 days, MD to replete as needed, as pt is at risk for refeeding syndrome given no nutrition in at least 7 days.   NUTRITION DIAGNOSIS:   Inadequate oral intake related to inability to eat as evidenced by NPO status.  GOAL:   Patient will meet greater than or equal to 90% of their needs  MONITOR:   Diet advancement, Labs, Weight trends, I & O's, Other (Comment)(TPN regimen)  REASON FOR ASSESSMENT:   Consult New TPN/TNA  ASSESSMENT:   74 y.o. male with medical history of HTN, rheumatoid arthritis, and COPD. He presented to the ED with abdominal pain and N/V x2 days. His last BM PTA was 3 days PTA.  Patient has been NPO since admission. He was found to have SOB and underweight ex lap with enterolysis and decompressive enterotomy on 6/4. NGT place 6/4 in L nare (gastric placement) and is to LIS with 300 ml dark brown output in canister at this time.   Patient denies any nausea this AM, reports some tenderness to abdomen and that it is worse with coughing; he has been coughing up phlegm in addition to NGT output.   Patient states that PTA his appetite "came and went" and that there was nothing he can think of that would cause decreased/poor appetite. He states that the last time he ate was 2 days PTA and that he had a few bites of a roast beef sandwich, fries, and a vanilla milkshake. After the meal, he was having abdominal pain and that's when N/V began.   Per chart review, weight on 6/1 was 203 lb and PTA the most recently documented weight was on 01/15/19 when he weighed 219 lb. This indicates 16 lb weight loss (7.3% body weight) in the past 6 months; not significant for time frame, but unsure if weight loss occurred more acutely.   Plan for  PICC placement later today and then start custom TPN at 40 ml/hr at 1800 due to post-op ileus.    Labs reviewed; CBGs: 90 and 91 mg/dl, Cl: 91 mmol/l, BUN: 42 mg/dl, Ca: 8.4 mg/dl. Medications reviewed; 40 mg IV lasix BID, sliding scale novolog, 10 mEq IV KCl x4 runs 6/7.    NUTRITION - FOCUSED PHYSICAL EXAM:    Most Recent Value  Orbital Region  No depletion  Upper Arm Region  Mild depletion  Thoracic and Lumbar Region  No depletion  Buccal Region  No depletion  Temple Region  No depletion  Clavicle Bone Region  Mild depletion  Clavicle and Acromion Bone Region  No depletion  Scapular Bone Region  Unable to assess  Dorsal Hand  No depletion  Patellar Region  No depletion  Anterior Thigh Region  Unable to assess  Posterior Calf Region  No depletion  Edema (RD Assessment)  None  Hair  Reviewed  Eyes  Reviewed  Mouth  Reviewed  Skin  Reviewed  Nails  Reviewed       Diet Order:   Diet Order            Diet NPO time specified Except for: Ice Chips  Diet effective now              EDUCATION NEEDS:   No education needs have been identified at this time  Skin:  Skin  Assessment: Skin Integrity Issues: Skin Integrity Issues:: Incisions Incisions: abdomen (6/4)  Last BM:  PTA/unknown  Height:   Ht Readings from Last 1 Encounters:  07/22/19 6\' 1"  (1.854 m)    Weight:   Wt Readings from Last 1 Encounters:  07/22/19 92.2 kg    Estimated Nutritional Needs:  Kcal:  1850-2050 kcal Protein:  95-110 grams Fluid:  >/= 2 L/day     Jarome Matin, MS, RD, LDN, CNSC Inpatient Clinical Dietitian RD pager # available in [place]  After hours/weekend pager # available in [facility]

## 2019-07-28 NOTE — Progress Notes (Addendum)
Progress Note  Patient Name: Jake [name] Date of Encounter: 07/28/2019  Primary Cardiologist:  No primary care provider on file.  Dr Claudie Leach from [facility] (assigned to him, he has seen [name], [facility])  Subjective   No CP or SOB.  Remains in atrial fibrillation with HR in the 90's but gets up into the 120's with talking.   Inpatient Medications    Scheduled Meds: . chlorhexidine  15 mL Mouth Rinse BID  . Chlorhexidine Gluconate Cloth  6 each Topical Daily  . furosemide  40 mg Intravenous BID  . [START ON 07/29/2019] insulin aspart  0-15 Units Subcutaneous Q6H  . ipratropium  0.5 mg Nebulization TID  . levalbuterol  0.63 mg Nebulization TID  . mouth rinse  15 mL Mouth Rinse BID   Continuous Infusions: . diltiazem (CARDIZEM) infusion 15 mg/hr (07/28/19 0500)  . heparin 2,200 Units/hr (07/28/19 0742)  . piperacillin-tazobactam (ZOSYN)  IV Stopped (07/28/19 0941)  . potassium chloride 10 mEq (07/28/19 0941)  . TPN ADULT (ION)    . vancomycin     PRN Meds: guaiFENesin-dextromethorphan, HYDROmorphone (DILAUDID) injection, labetalol, levalbuterol, methocarbamol, ondansetron **OR** ondansetron (ZOFRAN) IV, polyethylene glycol, senna-docusate   Vital Signs    Vitals:   07/28/19 0500 07/28/19 0600 07/28/19 0730 07/28/19 0800  BP: (!) 127/54 108/63  (!) 127/50  Pulse: (!) 38 (!) 118  93  Resp: (!) 24 19  (!) 23  Temp:    98.4 F (36.9 C)  TempSrc:    Axillary  SpO2: 95% 97% 97% 96%  Weight:      Height:        Intake/Output Summary (Last 24 hours) at 07/28/2019 1041 Last data filed at 07/28/2019 0800 Gross per 24 hour  Intake 1592.39 ml  Output 600 ml  Net 992.39 ml   Filed Weights   07/22/19 1812 07/22/19 2330  Weight: 95.3 kg 92.2 kg   Last Weight  Most recent update: 07/22/2019 11:56 PM   Weight  92.2 kg (203 lb 4.2 oz)           Weight change:    Telemetry    Atrial fibrillation with HR in the 90's but gets into 120's with talking- Personally  Reviewed  ECG    No new EKG to review- Personally Reviewed  Physical Exam   GEN: Well nourished, well developed in no acute distress HEENT: Normal NECK: No JVD; No carotid bruits LYMPHATICS: No lymphadenopathy CARDIAC:irregularly irregular and tachy, no murmurs, rubs, gallops RESPIRATORY: diffuse wheezing anteriorly ABDOMEN: Soft MUSCULOSKELETAL:  No edema; No deformity  SKIN: Warm and dry NEUROLOGIC:  Alert and oriented x 3 PSYCHIATRIC:  Normal affect    Labs    Hematology Recent Labs  Lab 07/26/19 0542 07/27/19 0307 07/28/19 0310  WBC 7.8 8.2 9.9  RBC 3.26* 3.20* 3.27*  HGB 11.8* 11.3* 11.6*  HCT 34.6* 34.3* 34.9*  MCV 106.1* 107.2* 106.7*  MCH 36.2* 35.3* 35.5*  MCHC 34.1 32.9 33.2  RDW 13.5 13.8 13.9  PLT 142* 151 157    Chemistry Recent Labs  Lab 07/22/19 1854 07/23/19 0300 07/26/19 0542 07/27/19 0307 07/28/19 0310  NA 140   < > 136 137 135  K 3.5   < > 3.8 3.7 3.5  CL 91*   < > 95* 94* 91*  CO2 32   < > 29 32 32  GLUCOSE 143*   < > 147* 94 93  BUN 36*   < > 42* 37* 42*  CREATININE 1.37*   < >  1.39* 0.94 1.12  CALCIUM 9.1   < > 8.1* 8.6* 8.4*  PROT 7.4  --   --   --   --   ALBUMIN 4.4  --   --   --   --   AST 21  --   --   --   --   ALT 18  --   --   --   --   ALKPHOS 68  --   --   --   --   BILITOT 2.1*  --   --   --   --   GFRNONAA 50*   < > 49* >60 >60  GFRAA 58*   < > 57* >60 >60  ANIONGAP 17*   < > 12 11 12    < > = values in this interval not displayed.     High Sensitivity Troponin:   Recent Labs  Lab 07/23/19 0300  TROPONINIHS 24*      BNP Recent Labs  Lab 07/27/19 0751  BNP 269.2*     DDimer No results for input(s): DDIMER in the last 168 hours.   Radiology    DG Abd 1 View  Result Date: 07/25/2019 CLINICAL DATA:  Check gastric catheter placement EXAM: ABDOMEN - 1 VIEW COMPARISON:  None. FINDINGS: Gastric catheter is noted with the tip in the stomach. The proximal side port lies in the distal esophagus and should be  advanced several cm. This is similar to that seen on prior exam. Multiple dilated loops of small bowel are again noted. No free air is seen. IMPRESSION: Gastric catheter as described. This should be advanced further into the stomach. Electronically Signed   By: Inez Catalina M.D.   On: 07/25/2019 02:31   DG CHEST PORT 1 VIEW  Result Date: 07/28/2019 CLINICAL DATA:  Shortness of breath, cough EXAM: PORTABLE CHEST 1 VIEW COMPARISON:  Radiograph 07/27/2018 FINDINGS: Transesophageal tube tip distal to the GE junction, terminating below the level of imaging. Telemetry leads overlie the chest. Lung volumes are low with some streaky basilar opacities favoring atelectasis. Increasing opacity in the right lung base could reflect worsening atelectasis or developing airspace disease. Central vascular crowding is noted. No convincing features of edema. The aorta is calcified. The remaining cardiomediastinal contours are unremarkable. No acute osseous or soft tissue abnormality. Degenerative changes are present in the imaged spine and shoulders. IMPRESSION: Transesophageal tube tip below the GE junction. Low lung volumes with some streaky basilar opacities favoring atelectasis. More patchy increased opacity in the right lung base could reflect further volume loss or developing airspace disease. Electronically Signed   By: Lovena Le M.D.   On: 07/28/2019 04:08   DG Chest Port 1 View  Result Date: 07/27/2019 CLINICAL DATA:  Dyspnea, post exploratory laparotomy, small-bowel resection, and enterolysis; past history hypertension, COPD, atrial fibrillation EXAM: PORTABLE CHEST 1 VIEW COMPARISON:  Portable exam 0758 hours compared to 05/29/2019 FINDINGS: Nasogastric tube extends into abdomen. Upper normal heart size. Mediastinal contours and pulmonary vascularity normal. EKG leads project over chest. Decreased lung volumes with bibasilar atelectasis. No definite infiltrate, pleural effusion or pneumothorax. IMPRESSION:  Decreased lung volumes with bibasilar atelectasis. Electronically Signed   By: Lavonia Dana M.D.   On: 07/27/2019 10:11   [place] EKG SITE RITE  Result Date: 07/28/2019 If Site Rite image not attached, placement could not be confirmed due to current cardiac rhythm.    Cardiac Studies   ECHO:  07/22/2019 1. Left ventricular ejection fraction, by estimation, is  55 to 60%. The  left ventricle has normal function. The left ventricle has no regional  wall motion abnormalities. Left ventricular diastolic parameters are  indeterminate.  2. Right ventricular systolic function is normal. The right ventricular  size is mildly enlarged. There is mildly elevated pulmonary artery  systolic pressure. The estimated right ventricular systolic pressure is  70.3 mmHg.  3. The mitral valve is normal in structure. Trivial mitral valve  regurgitation. No evidence of mitral stenosis.  4. The aortic valve is tricuspid. Aortic valve regurgitation is not  visualized. No aortic stenosis is present.  5. The inferior vena cava is normal in size with greater than 50%  respiratory variability, suggesting right atrial pressure of 3 mmHg.  6. The patient was in atrial fibrillation.   Patient Profile     74 y.o. 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  Assessment & Plan    1. SBO - still complains of abdominal pain - s/p lab chole with enterolysis and decompressive enterotomy with SB anastamosis - surgery following  2. Atrial fib, RVR -HR improved since yesterday after placing back on Cardizem gtt>>? Poor absorption of PO -continue IV Cardizem for now -increase Cardizem gtt to 10mg /hr after 15mg  bolus -avoid BB in setting of active wheezing -if cannot get HR under control may need to consider IV Amio -Mr. Mcclenahan could not afford Xarelto and  he does not remember discussing Coumadin with his PCP. -he was taking baby aspirin a few times a week. -Currently on IV Heparin gtt and recommend changing to Coumadin once ok by surgery- CHA2DS2-VASc is 3 (age x 2, HTN)  3. COPD/exacerbation/Acute respiratory failure with hypoxia - has significant wheezing on exam today -concern for HCAP and likely CHF -getting nebs and antibx -per TRH  4.  HTN -BP controlled -continue IV Cardizem gtt for now -he was on Lisinopril and HCTZ at home currently on hold  5.  Acute diastolic CHF -BNP mildly elevated although Cxray with no edema -likely related to afib with RVR recently -he has put out 425cc and is net + 2.4L -fluids have been stopped -creatinine stable at 1.12 -continue lasix 40mg  IV BID and follow up renal function, weights and I&O's closely  I have spent a total of 35 minutes with patient reviewing hospital notes , telemetry, EKGs, labs and examining patient as well as establishing an assessment and plan that was discussed with the patient.  > 50% of time was spent in direct patient care.     Signed, Fransico Him, MD 10:41 AM 07/28/2019

## 2019-07-28 NOTE — Progress Notes (Addendum)
ANTICOAGULATION CONSULT NOTE - Follow Up Consult  Pharmacy Consult for heparin Indication: atrial fibrillation  Allergies  Allergen Reactions  . Propoxyphene Nausea And Vomiting   Patient Measurements: Height: 6\' 1"  (185.4 cm) Weight: 92.2 kg (203 lb 4.2 oz) IBW/kg (Calculated) : 79.9 Heparin Dosing Weight: 92 kg  Vital Signs: Temp: 99 F (37.2 C) (06/07 0309) Temp Source: Oral (06/07 0309) BP: 108/63 (06/07 0600) Pulse Rate: 118 (06/07 0600)  Labs: Recent Labs    07/26/19 0542 07/26/19 0742 07/27/19 0307 07/27/19 1458 07/28/19 0310  HGB 11.8*   < > 11.3*  --  11.6*  HCT 34.6*  --  34.3*  --  34.9*  PLT 142*  --  151  --  157  HEPARINUNFRC  --    < > 0.16* 0.39 0.29*  CREATININE 1.39*  --  0.94  --  1.12   < > = values in this interval not displayed.   Estimated Creatinine Clearance: 64.4 mL/min (by C-G formula based on SCr of 1.12 mg/dL).  Assessment: Patient's a 74 y.o M with PAF (CHADS2Vasc =3), not currently on anticoagulation as outpatient.  He previously took Xarelto, but he could not afford this and was switched to aspirin.  He presented to the ED on 6/1 with c/o abd pain and constipation.  Abdominal CT showed mid to distal SBO.  He subsequently went into afib with RVR with heparin drip started on 6/2.  Patient went to the OR on 6/4 for exp lap, enterolysis, decompressive enterotomy and small bowel anastomosis.  Heparin drip was stopped at 9a on 6/4 for surgery.  CCS recom. to resume heparin drip back at 10p on 6/4 post-op.  Today, 07/28/2019:  HL 0.29 slightly sub-therapeutic on 2000 units/hr  CBC low but stable  plts WNL  Goal of Therapy:  Heparin level 0.3-0.7 units/ml Monitor platelets by anticoagulation protocol: Yes   Plan:   increase Heparin drip to 2200 units/hr  Heparin level in 8 hours  Daily CBC, heparin level  Dolly Rias RPh 07/28/2019, 7:13 AM  Pharmacy consulted to transition heparin to enoxaparin after PICC placed -once PICC  placed stop heparin drip and start enoxaprin 1mg /kg (90mg ) SQ q12h - f/u CBC   Dolly Rias RPh 07/28/2019, 1:54 PM

## 2019-07-28 NOTE — Progress Notes (Addendum)
[facility] Progress Note  3 Days Post-Op  Subjective: NG dislodged this AM - advanced by RN during my exam with return of bilious fluid.  Rates abdominal pain 7/10, improves with medication. Denies any flatus or BM. Reports he got OOB yesterday.   Afebrile, intermittent tachycardia/afib RVR on hep gtt  UOP ~800cc/24h   Objective: Vital signs in last 24 hours: Temp:  [97.8 F (36.6 C)-99 F (37.2 C)] 99 F (37.2 C) (06/07 0309) Pulse Rate:  [38-118] 118 (06/07 0600) Resp:  [14-28] 19 (06/07 0600) BP: (108-144)/(45-91) 108/63 (06/07 0600) SpO2:  [85 %-98 %] 97 % (06/07 0730) Last BM Date: 07/19/19  Intake/Output from previous day: 06/06 0701 - 06/07 0700 In: 1537.8 [P.O.:360; I.V.:727.8] Out: 425 [Urine:325; Emesis/NG output:100] Intake/Output this shift: No intake/output data recorded.  PE: General: pleasant, WD, chronically ill appearing white male laying in bed in NAD Heart: irregularly irregular, DP 2+ BL Lungs: diminished bilateral lung bases Abd: soft, distended, +BS hypoactive, appropriately TTP without guarding, incision c/d/i >80% granulation tissue with some fibrinous exudate at wound base as below.   GU: foley in place with clear, yellow/amber urine   Lab Results:  Recent Labs    07/27/19 0307 07/28/19 0310  WBC 8.2 9.9  HGB 11.3* 11.6*  HCT 34.3* 34.9*  PLT 151 157   BMET Recent Labs    07/27/19 0307 07/28/19 0310  NA 137 135  K 3.7 3.5  CL 94* 91*  CO2 32 32  GLUCOSE 94 93  BUN 37* 42*  CREATININE 0.94 1.12  CALCIUM 8.6* 8.4*   PT/INR No results for input(s): LABPROT, INR in the last 72 hours. CMP     Component Value Date/Time   NA 135 07/28/2019 0310   K 3.5 07/28/2019 0310   CL 91 (L) 07/28/2019 0310   CO2 32 07/28/2019 0310   GLUCOSE 93 07/28/2019 0310   BUN 42 (H) 07/28/2019 0310   CREATININE 1.12 07/28/2019 0310   CALCIUM 8.4 (L) 07/28/2019 0310   PROT 7.4 07/22/2019 1854   ALBUMIN 4.4 07/22/2019 1854   AST 21  07/22/2019 1854   ALT 18 07/22/2019 1854   ALKPHOS 68 07/22/2019 1854   BILITOT 2.1 (H) 07/22/2019 1854   GFRNONAA >60 07/28/2019 0310   GFRAA >60 07/28/2019 0310   Lipase     Component Value Date/Time   LIPASE 19 07/22/2019 1854       Studies/Results: DG CHEST PORT 1 VIEW  Result Date: 07/28/2019 CLINICAL DATA:  Shortness of breath, cough EXAM: PORTABLE CHEST 1 VIEW COMPARISON:  Radiograph 07/27/2018 FINDINGS: Transesophageal tube tip distal to the GE junction, terminating below the level of imaging. Telemetry leads overlie the chest. Lung volumes are low with some streaky basilar opacities favoring atelectasis. Increasing opacity in the right lung base could reflect worsening atelectasis or developing airspace disease. Central vascular crowding is noted. No convincing features of edema. The aorta is calcified. The remaining cardiomediastinal contours are unremarkable. No acute osseous or soft tissue abnormality. Degenerative changes are present in the imaged spine and shoulders. IMPRESSION: Transesophageal tube tip below the GE junction. Low lung volumes with some streaky basilar opacities favoring atelectasis. More patchy increased opacity in the right lung base could reflect further volume loss or developing airspace disease. Electronically Signed   By: Lovena Le M.D.   On: 07/28/2019 04:08   DG Chest Port 1 View  Result Date: 07/27/2019 CLINICAL DATA:  Dyspnea, post exploratory laparotomy, small-bowel resection, and enterolysis; past history hypertension,  COPD, atrial fibrillation EXAM: PORTABLE CHEST 1 VIEW COMPARISON:  Portable exam 0758 hours compared to 05/29/2019 FINDINGS: Nasogastric tube extends into abdomen. Upper normal heart size. Mediastinal contours and pulmonary vascularity normal. EKG leads project over chest. Decreased lung volumes with bibasilar atelectasis. No definite infiltrate, pleural effusion or pneumothorax. IMPRESSION: Decreased lung volumes with bibasilar  atelectasis. Electronically Signed   By: Lavonia Dana M.D.   On: 07/27/2019 10:11    Anti-infectives: Anti-infectives (From admission, onward)   Start     Dose/Rate Route Frequency Ordered Stop   07/28/19 1800  vancomycin (VANCOCIN) IVPB 1000 mg/200 mL premix     1,000 mg 200 mL/hr over 60 Minutes Intravenous Every 12 hours 07/28/19 0621     07/28/19 0445  vancomycin (VANCOREADY) IVPB 2000 mg/400 mL     2,000 mg 200 mL/hr over 120 Minutes Intravenous  Once 07/28/19 0439 07/28/19 0701   07/28/19 0445  piperacillin-tazobactam (ZOSYN) IVPB 3.375 g     3.375 g 12.5 mL/hr over 240 Minutes Intravenous Every 8 hours 07/28/19 0439     07/25/19 0915  ceFAZolin (ANCEF) IVPB 2g/100 mL premix     2 g 200 mL/hr over 30 Minutes Intravenous On call 07/25/19 0908 07/25/19 1307       Assessment/Plan COPD/100+ pack year history -currently @ 1/2 PPD Rheumatoid arthritis Atrial fibrillation - controlled on cardizem gtt, hep gtt Dehydration/acute renal failure - Cr 1.41 from 1.14 PMH RA on methotrexate   Small bowel obstruction with history of colon resection in 1980s S/p ex lap, enterolysis, decompressive enterotomy and SB anastomosis 6/4 Dr. Hassell Done - POD#3, afebrile, WBC 9.9 - continue to await return of bowel function - OOB, PT/OT - IS 10x q 1h, add flutter valve  - start TNA  - wet-to-dry dressing changes BID to abd wound and monitor   FEN: N.p.o./IV fluids, PICC/TNA given over 8 days without PO intake and no return of bowel function  ID: Zosyn 6/7 , started by primary team for potential HCAP VTE: heparin gtt GU: indwelling foley; ok to D/C and place condom cath from CCS perspective. Follow-up: Dr. Hassell Done    LOS: 6 days    Jill Alexanders , [facility] 07/28/2019, 7:45 AM Please see Amion for pager number during day hours 7:00am-4:30pm

## 2019-07-28 NOTE — Progress Notes (Signed)
Pharmacy Antibiotic Note  Jake [name] is a 74 y.o. male admitted on 07/22/2019 with pneumonia.  Pharmacy has been consulted for Vancomycin and Zosyn dosing.  Plan: Zosyn 3.375g IV q8h (4 hour infusion).   Vancomycin 2gm iv x1, then Vancomycin 1gm iv q12hr  Vancomycin goal trough: 15-20  Height: 6\' 1"  (185.4 cm) Weight: 92.2 kg (203 lb 4.2 oz) IBW/kg (Calculated) : 79.9  Temp (24hrs), Avg:98.4 F (36.9 C), Min:97.8 F (36.6 C), Max:99 F (37.2 C)  Recent Labs  Lab 07/23/19 0228 07/23/19 0839 07/23/19 1041 07/24/19 0036 07/25/19 0200 07/25/19 0204 07/26/19 0542 07/27/19 0307 07/28/19 0310  WBC   < >  --   --  4.7 10.1  --  7.8 8.2 9.9  CREATININE   < >  --   --  1.14  --  1.41* 1.39* 0.94 1.12  LATICACIDVEN  --  1.2 1.2  --   --   --   --   --   --    < > = values in this interval not displayed.    Estimated Creatinine Clearance: 64.4 mL/min (by C-G formula based on SCr of 1.12 mg/dL).    Allergies  Allergen Reactions  . Propoxyphene Nausea And Vomiting    Antimicrobials this admission: Vancomycin 07/28/2019 >> Zosyn 07/28/2019 >>  Dose adjustments this admission: -  Microbiology results: -  Thank you for allowing pharmacy to be a part of this patient's care.  Nani Skillern Crowford 07/28/2019 6:24 AM

## 2019-07-28 NOTE — Progress Notes (Signed)
PROGRESS NOTE    Jake [name]  JAS:[id number] DOB: November 26, 1945 DOA: 07/22/2019 PCP: Glendon Axe, MD   Brief Narrative:  74 y.o.malewithhistory of hypertension rheumatoid arthritis, AAA without rupture, hepatitis C COPD presented to the ER with complaints of abdominal pain and nausea vomiting. In the ER CT abdomen pelvis shows small bowel obstruction and on-call general surgeon Dr. Harlow Asa was consulted who requested NG tube placement and will be seeing patient in consult.  Initially failed medical management therefore surgical intervention was required of 6/4.  Hospital course also complicated by atrial fibrillation with RVR, cardiology consulted.    Assessment & Plan:   Principal Problem:   SBO (small bowel obstruction) (HCC) Active Problems:   A-fib (HCC)  Acute hypoxic respiratory failure/COPD with Hypoxia Overnight concerns of HCAP versus fluid overload.  Procalcitonin 0.8 Continue empiric vancomycin and Zosyn.  If remains afebrile over next 24 hours and improvement in symptoms with diuretics will discontinue antibiotics Stop IV fluids, Lasix 40 mg IV Change Foley to condom catheter Supplemental oxygen as needed, bronchodilators Xopenex. Ipratropium; IS/Flutter  Small bowel obstruction; mechanical Status post ex lap/enterolysis, decompressive enterotomy 6/4 Failed medical management therefore require surgical intervention on 6/4 Diet per Gen Surgery Dressing per surgery  Atrial fibrillation with RVR, new onset Echocardiogram shows EF of 55-60% Currently patient is on Cardizem drip, management per cardiology Heparin drip, transition to subcu Lovenox once PICC line is placed  Essential hypertension Home HCTZ and lisinopril on hold Continue Cardizem  History of rheumatoid arthritis on methotrexate presently n.p.o.  Mild renal insufficiency, baseline creatinine 1.1 -Creatinine peaked at 1.4 today.  Today is 0.94    DVT prophylaxis: Full dose anticoagulation to  be restarted once cleared by general surgery Code Status: Full code Family Communication:   Status is: Inpatient  Remains inpatient appropriate because:IV treatments appropriate due to intensity of illness or inability to take PO   Dispo: The patient is from: Home              Anticipated d/c is to: Home              Anticipated d/c date is: 3 days              Patient currently is not medically stable to d/c.  Patient is not safe for discharge, currently will be getting TPA via PICC line, needs better rate control.  In the meantime getting subcu Lovenox, and IV diuretics for shortness of breath   Subjective: Still having abdominal pain.  Overnight had some productive coughing.  Overnight provider started patient on vancomycin and Zosyn for concerns of HCAP.  Procalcitonin 0.8.   Review of Systems Otherwise negative except as per HPI, including: General: Denies fever, chills, night sweats or unintended weight loss. Resp: Denies cough, wheezing, shortness of breath. Cardiac: Denies chest pain, palpitations, orthopnea, paroxysmal nocturnal dyspnea. GI: Denies  diarrhea or constipation GU: Denies dysuria, frequency, hesitancy or incontinence MS: Denies muscle aches, joint pain or swelling Neuro: Denies headache, neurologic deficits (focal weakness, numbness, tingling), abnormal gait Psych: Denies anxiety, depression, SI/HI/AVH Skin: Denies new rashes or lesions ID: Denies sick contacts, exotic exposures, travel  Examination:  Constitutional: Not in acute distress, 5 L nasal cannula Respiratory: Bibasilar crackles Cardiovascular: Normal sinus rhythm, no rubs Abdomen: Nontender nondistended good bowel sounds Musculoskeletal: No edema noted Skin: No rashes seen Neurologic: CN 2-12 grossly intact.  And nonfocal Psychiatric: Normal judgment and insight. Alert and oriented x 3. Normal mood.  Foley catheter in place, will  be removed today Surgical scar on the abdomen  noted Objective: Vitals:   07/28/19 0500 07/28/19 0600 07/28/19 0730 07/28/19 0800  BP: (!) 127/54 108/63  (!) 127/50  Pulse: (!) 38 (!) 118  93  Resp: (!) 24 19  (!) 23  Temp:    98.4 F (36.9 C)  TempSrc:    Axillary  SpO2: 95% 97% 97% 96%  Weight:      Height:        Intake/Output Summary (Last 24 hours) at 07/28/2019 1032 Last data filed at 07/28/2019 0800 Gross per 24 hour  Intake 1592.39 ml  Output 600 ml  Net 992.39 ml   Filed Weights   07/22/19 1812 07/22/19 2330  Weight: 95.3 kg 92.2 kg     Data Reviewed:   CBC: Recent Labs  Lab 07/22/19 1854 07/23/19 0228 07/24/19 0036 07/25/19 0200 07/26/19 0542 07/27/19 0307 07/28/19 0310  WBC 8.0   < > 4.7 10.1 7.8 8.2 9.9  NEUTROABS 6.4  --   --   --   --   --   --   HGB 14.8   < > 13.0 12.7* 11.8* 11.3* 11.6*  HCT 43.5   < > 39.9 38.7* 34.6* 34.3* 34.9*  MCV 104.1*   < > 109.3* 106.9* 106.1* 107.2* 106.7*  PLT 256   < > 214 170 142* 151 157   < > = values in this interval not displayed.   Basic Metabolic Panel: Recent Labs  Lab 07/23/19 0300 07/23/19 0300 07/24/19 0036 07/25/19 0204 07/26/19 0542 07/27/19 0307 07/28/19 0310  NA 143   < > 143 139 136 137 135  K 4.7   < > 3.6 3.4* 3.8 3.7 3.5  CL 93*   < > 94* 91* 95* 94* 91*  CO2 33*   < > 38* 35* 29 32 32  GLUCOSE 110*   < > 122* 131* 147* 94 93  BUN 38*   < > 33* 38* 42* 37* 42*  CREATININE 1.43*   < > 1.14 1.41* 1.39* 0.94 1.12  CALCIUM 8.8*   < > 8.7* 8.8* 8.1* 8.6* 8.4*  MG 1.9  --  2.0 1.7  --  2.0 2.0   < > = values in this interval not displayed.   GFR: Estimated Creatinine Clearance: 64.4 mL/min (by C-G formula based on SCr of 1.12 mg/dL). Liver Function Tests: Recent Labs  Lab 07/22/19 1854  AST 21  ALT 18  ALKPHOS 68  BILITOT 2.1*  PROT 7.4  ALBUMIN 4.4   Recent Labs  Lab 07/22/19 1854  LIPASE 19   No results for input(s): AMMONIA in the last 168 hours. Coagulation Profile: Recent Labs  Lab 07/24/19 0036  INR 1.1    Cardiac Enzymes: No results for input(s): CKTOTAL, CKMB, CKMBINDEX, TROPONINI in the last 168 hours. BNP (last 3 results) No results for input(s): PROBNP in the last 8760 hours. HbA1C: No results for input(s): HGBA1C in the last 72 hours. CBG: Recent Labs  Lab 07/27/19 1145 07/27/19 1652 07/27/19 1930 07/28/19 0309 07/28/19 0753  GLUCAP 92 88 90 90 91   Lipid Profile: No results for input(s): CHOL, HDL, LDLCALC, TRIG, CHOLHDL, LDLDIRECT in the last 72 hours. Thyroid Function Tests: No results for input(s): TSH, T4TOTAL, FREET4, T3FREE, THYROIDAB in the last 72 hours. Anemia Panel: No results for input(s): VITAMINB12, FOLATE, FERRITIN, TIBC, IRON, RETICCTPCT in the last 72 hours. Sepsis Labs: Recent Labs  Lab 07/23/19 0839 07/23/19 1041 07/28/19 0513  PROCALCITON  --   --  0.85  LATICACIDVEN 1.2 1.2  --     Recent Results (from the past 240 hour(s))  SARS Coronavirus 2 by RT PCR (hospital order, performed in [facility] hospital lab) Nasopharyngeal Nasopharyngeal Swab     Status: None   Collection Time: 07/22/19  6:54 PM   Specimen: Nasopharyngeal Swab  Result Value Ref Range Status   SARS Coronavirus 2 NEGATIVE NEGATIVE Final    Comment: (NOTE) SARS-CoV-2 target nucleic acids are NOT DETECTED. The SARS-CoV-2 RNA is generally detectable in upper and lower respiratory specimens during the acute phase of infection. The lowest concentration of SARS-CoV-2 viral copies this assay can detect is 250 copies / mL. A negative result does not preclude SARS-CoV-2 infection and should not be used as the sole basis for treatment or other patient management decisions.  A negative result may occur with improper specimen collection / handling, submission of specimen other than nasopharyngeal swab, presence of viral mutation(s) within the areas targeted by this assay, and inadequate number of viral copies (<250 copies / mL). A negative result must be combined with  clinical observations, patient history, and epidemiological information. Fact Sheet for Patients:   StrictlyIdeas.no Fact Sheet for Healthcare Providers: BankingDealers.co.za This test is not yet approved or cleared  by the [place] FDA and has been authorized for detection and/or diagnosis of SARS-CoV-2 by FDA under an Emergency Use Authorization (EUA).  This EUA will remain in effect (meaning this test can be used) for the duration of the COVID-19 declaration under Section 564(b)(1) of the Act, 21 U.S.C. section 360bbb-3(b)(1), unless the authorization is terminated or revoked sooner. Performed at [facility], [address] [place], [place] [address]   MRSA PCR Screening     Status: None   Collection Time: 07/22/19 11:25 PM   Specimen: Nasal Mucosa; Nasopharyngeal  Result Value Ref Range Status   MRSA by PCR NEGATIVE NEGATIVE Final    Comment:        The GeneXpert MRSA Assay (FDA approved for NASAL specimens only), is one component of a comprehensive MRSA colonization surveillance program. It is not intended to diagnose MRSA infection nor to guide or monitor treatment for MRSA infections. Performed at [facility], [place] [address] [place], [place] [address]          Radiology Studies: DG CHEST PORT 1 VIEW  Result Date: 07/28/2019 CLINICAL DATA:  Shortness of breath, cough EXAM: PORTABLE CHEST 1 VIEW COMPARISON:  Radiograph 07/27/2018 FINDINGS: Transesophageal tube tip distal to the GE junction, terminating below the level of imaging. Telemetry leads overlie the chest. Lung volumes are low with some streaky basilar opacities favoring atelectasis. Increasing opacity in the right lung base could reflect worsening atelectasis or developing airspace disease. Central vascular crowding is noted. No convincing features of edema. The aorta is calcified. The remaining cardiomediastinal contours are  unremarkable. No acute osseous or soft tissue abnormality. Degenerative changes are present in the imaged spine and shoulders. IMPRESSION: Transesophageal tube tip below the GE junction. Low lung volumes with some streaky basilar opacities favoring atelectasis. More patchy increased opacity in the right lung base could reflect further volume loss or developing airspace disease. Electronically Signed   By: Lovena Le M.D.   On: 07/28/2019 04:08   DG Chest Port 1 View  Result Date: 07/27/2019 CLINICAL DATA:  Dyspnea, post exploratory laparotomy, small-bowel resection, and enterolysis; past history hypertension, COPD, atrial fibrillation EXAM: PORTABLE CHEST 1 VIEW COMPARISON:  Portable exam 0758 hours compared  to 05/29/2019 FINDINGS: Nasogastric tube extends into abdomen. Upper normal heart size. Mediastinal contours and pulmonary vascularity normal. EKG leads project over chest. Decreased lung volumes with bibasilar atelectasis. No definite infiltrate, pleural effusion or pneumothorax. IMPRESSION: Decreased lung volumes with bibasilar atelectasis. Electronically Signed   By: Lavonia Dana M.D.   On: 07/27/2019 10:11   [place] EKG SITE RITE  Result Date: 07/28/2019 If Site Rite image not attached, placement could not be confirmed due to current cardiac rhythm.       Scheduled Meds: . chlorhexidine  15 mL Mouth Rinse BID  . Chlorhexidine Gluconate Cloth  6 each Topical Daily  . furosemide  40 mg Intravenous BID  . [START ON 07/29/2019] insulin aspart  0-15 Units Subcutaneous Q6H  . ipratropium  0.5 mg Nebulization TID  . levalbuterol  0.63 mg Nebulization TID  . mouth rinse  15 mL Mouth Rinse BID   Continuous Infusions: . diltiazem (CARDIZEM) infusion 15 mg/hr (07/28/19 0500)  . heparin 2,200 Units/hr (07/28/19 0742)  . piperacillin-tazobactam (ZOSYN)  IV Stopped (07/28/19 0941)  . potassium chloride 10 mEq (07/28/19 0941)  . TPN ADULT (ION)    . vancomycin       LOS: 6 days   Time spent= 35  mins    Lorelei Heikkila Arsenio Loader, MD [facility]  If 7PM-7AM, please contact night-coverage  07/28/2019, 10:32 AM

## 2019-07-28 NOTE — TOC Progression Note (Signed)
Transition of Care [facility]) - Progression Note    Patient Details  Name: Jake [name] MRN: [id number] Date of Birth: [birth year]  Transition of Care [facility] CM/SW Contact  Leeroy Cha, RN Phone Number: 07/28/2019, 8:40 AM  Clinical Narrative:    Pod 3 ng tube to suction,nmpo, iv cardizem and iv heparin due to a.fib,iv abx x2  Plan-home with self care if abd wound improves.  Expected Discharge Plan: Home/Self Care Barriers to Discharge: Continued Medical Work up  Expected Discharge Plan and Services Expected Discharge Plan: Home/Self Care       Living arrangements for the past 2 months: Single Family Home                                       Social Determinants of Health (SDOH) Interventions    Readmission Risk Interventions No flowsheet data found.

## 2019-07-28 NOTE — Progress Notes (Signed)
Patient not up to chair due to need for exchange of PICC line for increased length. RN continuing to work with patient on incentive spirometer and flutter valve as able. Will get up to chair as soon as IV team is complete.

## 2019-07-28 NOTE — Progress Notes (Signed)
PHARMACY - TOTAL PARENTERAL NUTRITION CONSULT NOTE   Indication: bowel obstruction with post-operative ileus  Patient Measurements: Height: 6\' 1"  (185.4 cm) Weight: 92.2 kg (203 lb 4.2 oz) IBW/kg (Calculated) : 79.9 TPN AdjBW (KG): 92.2 Body mass index is 26.82 kg/m. Usual Weight:   Assessment:  Small bowel obstruction with history of colon resection in 1980s S/p ex lap, enterolysis, decompressive enterotomy and SB anastomosis 6/4  Glucose / Insulin: CGBs < 150, no hx of DM Electrolytes: Cl and Ca low, all others WNL Renal: WNL LFTs / TGs: TBili 2.1 on 6/1, others WNL Prealbumin / albumin: albumin 4.4 on 6/1 Intake / Output; MIVF: none GI Imaging: Surgeries / Procedures:   Central access: PICC ordered for 6/7 TPN start date: 6/7  Nutritional Goals waiting on RD assessment  Current Nutrition:  NPO  Plan:  K 66meq IV x 4 per MD  Start TPN at 69mL/hr at 1800 Electrolytes in TPN: 63mEq/L of Na, 83mEq/L of K, 79mEq/L of Ca, 20mEq/L of Mg, and 58mmol/L of Phos. Cl:Ac ratio 1:1 Add standard MVI and trace elements to TPN Initiate Moderate q6h SSI and adjust as needed  Monitor TPN labs on Mon/Thurs F/u RD recs

## 2019-07-28 NOTE — Progress Notes (Signed)
Peripherally Inserted Central Catheter Placement  The IV Nurse has discussed with the patient and/or persons authorized to consent for the patient, the purpose of this procedure and the potential benefits and risks involved with this procedure.  The benefits include less needle sticks, lab draws from the catheter, and the patient may be discharged home with the catheter. Risks include, but not limited to, infection, bleeding, blood clot (thrombus formation), and puncture of an artery; nerve damage and irregular heartbeat and possibility to perform a PICC exchange if needed/ordered by physician.  Alternatives to this procedure were also discussed.  Bard Power PICC patient education guide, fact sheet on infection prevention and patient information card has been provided to patient /or left at bedside.    PICC Placement Documentation  PICC Double Lumen 24/23/53 PICC Left Basilic 45 cm 0 cm (Active)  Indication for Insertion or Continuance of Line Administration of hyperosmolar/irritating solutions (i.e. TPN, Vancomycin, etc.) 07/28/19 1600  Exposed Catheter (cm) 0 cm 07/28/19 1600  Site Assessment Clean;Dry;Intact 07/28/19 1600  Lumen #1 Status Flushed;Blood return noted;Saline locked 07/28/19 1600  Lumen #2 Status Flushed;Blood return noted;Saline locked 07/28/19 1600  Dressing Type Transparent 07/28/19 1600  Dressing Status Clean;Dry;Intact;Antimicrobial disc in place 07/28/19 1600  Safety Lock Not Applicable 61/44/31 5400  Line Care Connections checked and tightened 07/28/19 1600  Line Adjustment (NICU/IV Team Only) No 07/28/19 1600  Dressing Intervention New dressing 07/28/19 1600  Dressing Change Due 08/04/19 07/28/19 Pierce, Keenan Bachelor 07/28/2019, 4:11 PM

## 2019-07-29 ENCOUNTER — Inpatient Hospital Stay (HOSPITAL_COMMUNITY): Payer: Medicare Other

## 2019-07-29 LAB — GLUCOSE, CAPILLARY
Glucose-Capillary: 131 mg/dL — ABNORMAL HIGH (ref 70–99)
Glucose-Capillary: 165 mg/dL — ABNORMAL HIGH (ref 70–99)
Glucose-Capillary: 171 mg/dL — ABNORMAL HIGH (ref 70–99)
Glucose-Capillary: 143 mg/dL — ABNORMAL HIGH (ref 70–99)
Glucose-Capillary: 153 mg/dL — ABNORMAL HIGH (ref 70–99)
Glucose-Capillary: 159 mg/dL — ABNORMAL HIGH (ref 70–99)

## 2019-07-29 LAB — BASIC METABOLIC PANEL
Anion gap: 15 (ref 5–15)
Anion gap: 11 (ref 5–15)
BUN: 42 mg/dL — ABNORMAL HIGH (ref 8–23)
BUN: 38 mg/dL — ABNORMAL HIGH (ref 8–23)
CO2: 31 mmol/L (ref 22–32)
CO2: 40 mmol/L — ABNORMAL HIGH (ref 22–32)
Calcium: 8 mg/dL — ABNORMAL LOW (ref 8.9–10.3)
Calcium: 8.2 mg/dL — ABNORMAL LOW (ref 8.9–10.3)
Chloride: 88 mmol/L — ABNORMAL LOW (ref 98–111)
Chloride: 87 mmol/L — ABNORMAL LOW (ref 98–111)
Creatinine, Ser: 1.11 mg/dL (ref 0.61–1.24)
Creatinine, Ser: 0.91 mg/dL (ref 0.61–1.24)
GFR calc Af Amer: >60 mL/min (ref >60)
GFR calc Af Amer: >60 mL/min (ref >60)
GFR calc non Af Amer: >60 mL/min (ref >60)
GFR calc non Af Amer: >60 mL/min (ref >60)
Glucose, Bld: 133 mg/dL — ABNORMAL HIGH (ref 70–99)
Glucose, Bld: 147 mg/dL — ABNORMAL HIGH (ref 70–99)
Potassium: 3.2 mmol/L — ABNORMAL LOW (ref 3.5–5.1)
Potassium: 3 mmol/L — ABNORMAL LOW (ref 3.5–5.1)
Sodium: 134 mmol/L — ABNORMAL LOW (ref 135–145)
Sodium: 138 mmol/L (ref 135–145)

## 2019-07-29 LAB — DIFFERENTIAL
Abs Immature Granulocytes: 0.23 10*3/uL — ABNORMAL HIGH (ref 0.00–0.07)
Basophils Absolute: 0 10*3/uL (ref 0.0–0.1)
Basophils Relative: 0 %
Eosinophils Absolute: 0 10*3/uL (ref 0.0–0.5)
Eosinophils Relative: 0 %
Immature Granulocytes: 3 %
Lymphocytes Relative: 6 %
Lymphs Abs: 0.5 10*3/uL — ABNORMAL LOW (ref 0.7–4.0)
Monocytes Absolute: 0.6 10*3/uL (ref 0.1–1.0)
Monocytes Relative: 7 %
Neutro Abs: 7.1 10*3/uL (ref 1.7–7.7)
Neutrophils Relative %: 84 %

## 2019-07-29 LAB — CBC
HCT: 35.1 % — ABNORMAL LOW (ref 39.0–52.0)
Hemoglobin: 11.7 g/dL — ABNORMAL LOW (ref 13.0–17.0)
MCH: 35.2 pg — ABNORMAL HIGH (ref 26.0–34.0)
MCHC: 33.3 g/dL (ref 30.0–36.0)
MCV: 105.7 fL — ABNORMAL HIGH (ref 80.0–100.0)
Platelets: 155 10*3/uL (ref 150–400)
RBC: 3.32 MIL/uL — ABNORMAL LOW (ref 4.22–5.81)
RDW: 13.7 % (ref 11.5–15.5)
WBC: 8.4 10*3/uL (ref 4.0–10.5)
nRBC: 0 % (ref 0.0–0.2)

## 2019-07-29 LAB — BRAIN NATRIURETIC PEPTIDE: B Natriuretic Peptide: 378.9 pg/mL — ABNORMAL HIGH (ref 0.0–100.0)

## 2019-07-29 LAB — PHOSPHORUS: Phosphorus: 3.1 mg/dL (ref 2.5–4.6)

## 2019-07-29 LAB — TRIGLYCERIDES: Triglycerides: 107 mg/dL (ref <150)

## 2019-07-29 LAB — PREALBUMIN: Prealbumin: 6 mg/dL — ABNORMAL LOW (ref 18–38)

## 2019-07-29 LAB — MAGNESIUM: Magnesium: 1.7 mg/dL (ref 1.7–2.4)

## 2019-07-29 MED ORDER — FUROSEMIDE 10 MG/ML IJ SOLN
40.0000 mg | Freq: Two times a day (BID) | INTRAMUSCULAR | Status: AC
  Administered 2019-07-29 – 2019-07-30 (×2): 40 mg via INTRAVENOUS
  Filled 2019-07-29 (×2): qty 4

## 2019-07-29 MED ORDER — MAGNESIUM SULFATE 2 GM/50ML IV SOLN
2.0000 g | Freq: Once | INTRAVENOUS | Status: AC
  Administered 2019-07-29: 2 g via INTRAVENOUS
  Filled 2019-07-29: qty 50

## 2019-07-29 MED ORDER — BUDESONIDE 0.5 MG/2ML IN SUSP
0.5000 mg | Freq: Two times a day (BID) | RESPIRATORY_TRACT | Status: DC
  Administered 2019-07-29 – 2019-08-07 (×19): 0.5 mg via RESPIRATORY_TRACT
  Filled 2019-07-29 (×19): qty 2

## 2019-07-29 MED ORDER — POTASSIUM CHLORIDE 10 MEQ/100ML IV SOLN
10.0000 meq | INTRAVENOUS | Status: AC
  Administered 2019-07-29 (×4): 10 meq via INTRAVENOUS
  Filled 2019-07-29 (×2): qty 100

## 2019-07-29 MED ORDER — TRACE MINERALS CU-MN-SE-ZN 300-55-60-3000 MCG/ML IV SOLN
INTRAVENOUS | Status: AC
  Filled 2019-07-29: qty 780

## 2019-07-29 MED ORDER — POTASSIUM CHLORIDE 10 MEQ/50ML IV SOLN: 10.0000 meq | INTRAVENOUS | Status: DC

## 2019-07-29 MED ORDER — POTASSIUM CHLORIDE 10 MEQ/100ML IV SOLN
10.0000 meq | INTRAVENOUS | Status: AC
  Administered 2019-07-29 (×4): 10 meq via INTRAVENOUS
  Filled 2019-07-29 (×3): qty 100

## 2019-07-29 NOTE — Progress Notes (Signed)
PHARMACY - TOTAL PARENTERAL NUTRITION CONSULT NOTE   Indication: bowel obstruction   Patient Measurements: Height: 6\' 1"  (185.4 cm) Weight: 92.2 kg (203 lb 4.2 oz) IBW/kg (Calculated) : 79.9 TPN AdjBW (KG): 92.2 Body mass index is 26.82 kg/m. Usual Weight: 100 kg in 12/2018  Assessment: 74 yo male presented on 07/22/2019 with abdominal pain, nausea, vomiting found to have SBO. Patient reports appetite "came and went" prior to admission and reports last food consumed 2 days prior to admission. Patient has been NPO since admission and underwent exploratory lap with enterolysis, decompressive enterotomy, and small bowel anastomosis on 6/4.   Glucose / Insulin: No hx of DM. CBGs increased since TPN initiation ranging from 100-165. 5 units/24 hours. On mSSI q6h. Electrolytes: Na 134. K 3.2 - diuresed with furosemide 40mg  IV x2 yesterday. Cl 88 Mg 1.7 - at low end of range.  Renal: Scr 1.11 - stable. 2965ml output with diuresis yesterday.  LFTs / TGs: AST/ALT wnl on 6/1. TG 107.  Prealbumin / albumin: Prealbumin 6.0. Albumin 4.4 on 6/1.  Intake / Output; MIVF: 1325 ml/24 hrs NG output.  GI Imaging: 6/1 CT: mid to distal SBO 6/2 x-ray: persistent SBO 6/3 x-ray: interval improvement in dilated small bowel loops Surgeries / Procedures:  6/4 ex lap, enterolysis, decompressive enterotomy and SB anastomosis   Central access: PICC placed 6/7 TPN start date: 6/7  Nutritional Goals (RD recommendation on 6/8 per verbal conversation with Jarome Matin): kCal: 2100-2300, Protein: 105-120g, Fluid: >2L Goal TPN rate is 70 mL/hr (provides 117g of protein and 2138 kcals per day)  Current Nutrition:  NPO TPN started on 6/7   Plan:  Increase TPN rate to 75 mL/hr at 1800 Changed from Travasol to [place] to decrease total volume administered given increase O2 requirement overnight and diuresis yesterday Electrolytes in TPN: Na increased to 90 mEq/L, K 30 mEq/L, Mg increased to 10 mEq/L, Cl:Ac ratio  changed max chloride Potassium 40 mEq IV with repeat BMP this evening  Add standard MVI and trace elements to TPN Continue Moderate q6h SSI and adjust as needed  Monitor TPN labs on Mon/Thurs, recheck CMP tomorrow   [name], PharmD PGY1 Pharmacy Resident [employer]: [phone]  07/29/2019,8:26 AM

## 2019-07-29 NOTE — Progress Notes (Signed)
PROGRESS NOTE    Jake [name]  EGB:[id number] DOB: Dec 21, 1945 DOA: 07/22/2019 PCP: Glendon Axe, MD   Brief Narrative:  74 y.o.malewithhistory of hypertension rheumatoid arthritis, AAA without rupture, hepatitis C COPD presented to the ER with complaints of abdominal pain and nausea vomiting. In the ER CT abdomen pelvis shows small bowel obstruction and on-call general surgeon Dr. Harlow Asa was consulted who requested NG tube placement and will be seeing patient in consult.  Initially failed medical management therefore surgical intervention was required of 6/4.  Hospital course also complicated by atrial fibrillation with RVR, cardiology consulted.    Assessment & Plan:   Principal Problem:   SBO (small bowel obstruction) (HCC) Active Problems:   A-fib (HCC)   Atrial fibrillation with rapid ventricular response (HCC)   Acute diastolic CHF (congestive heart failure) (HCC)   Essential hypertension  Acute hypoxic respiratory failure/COPD with Hypoxia Overnight concerns of HCAP versus fluid overload.  Procalcitonin 0.8 Continue empiric vancomycin and Zosyn.  If remains afebrile over next 24 hours and improvement in symptoms with diuretics will discontinue antibiotics Chest x-ray better but will give 2 more doses of IV Lasix as BNP is trending upwards. Change Foley to condom catheter Supplemental oxygen as needed, bronchodilators Xopenex. Ipratropium; IS/Flutter  Small bowel obstruction; mechanical Status post ex lap/enterolysis, decompressive enterotomy 6/4 Failed medical management therefore require surgical intervention on 6/4 Diet per Gen Surgery Dressing per surgery  Atrial fibrillation with RVR, new onset Echocardiogram shows EF of 55-60% Currently patient is on Cardizem drip, management per cardiology.   PICC line placed, TPN day.  Lovenox 1 mg/kg every 12 hours  Essential hypertension Home HCTZ and lisinopril on hold Continue Cardizem  History of rheumatoid  arthritis on methotrexate presently n.p.o.  Mild renal insufficiency, baseline creatinine 1.1 -Creatinine peaked at 1.4.  Today 1.1    DVT prophylaxis: Full dose anticoagulation to be restarted once cleared by general surgery Code Status: Full code Family Communication: Wife and son at bedside Status is: Inpatient  Remains inpatient appropriate because:IV treatments appropriate due to intensity of illness or inability to take PO   Dispo: The patient is from: Home              Anticipated d/c is to: Home              Anticipated d/c date is: 3 days              Patient currently is not medically stable to d/c.  Still hypoxic requiring IV Lasix, on TPN  Subjective: Still drowsy this morning but no new complaints besides abdominal discomfort.  Passing some gas.  PICC line in place, TPN running  Review of Systems Otherwise negative except as per HPI, including: General = no fevers, chills, dizziness,  fatigue HEENT/EYES = negative for loss of vision, double vision, blurred vision,  sore throa Cardiovascular= negative for chest pain, palpitation Respiratory/lungs= negative for shortness of breath, cough, wheezing; hemoptysis,  Gastrointestinal= negative for nausea, vomiting, abdominal pain Genitourinary= negative for Dysuria MSK = Negative for arthralgia, myalgias Neurology= Negative for headache, numbness, tingling  Psychiatry= Negative for suicidal and homocidal ideation Skin= Negative for Rash   Examination: Constitutional: Not in acute distress Respiratory: Clear to auscultation bilaterally Cardiovascular: Irregularly irregular Abdomen: Nontender nondistended good bowel sounds Musculoskeletal: No edema noted Skin: No rashes seen Neurologic: CN 2-12 grossly intact.  And nonfocal Psychiatric: Normal judgment and insight. Alert and oriented x 3. Normal mood.    PICC line noted-left upper extremity External  Foley catheter Surgical scar on the abdomen  noted Objective: Vitals:   07/29/19 0825 07/29/19 0900 07/29/19 1000 07/29/19 1133  BP:   124/76   Pulse:  94 87   Resp:  15 19   Temp: 99 F (37.2 C)     TempSrc: Axillary     SpO2:  94% 98% 98%  Weight:      Height:        Intake/Output Summary (Last 24 hours) at 07/29/2019 1139 Last data filed at 07/29/2019 2355 Gross per 24 hour  Intake 2273.85 ml  Output 4150 ml  Net -1876.15 ml   Filed Weights   07/22/19 1812 07/22/19 2330  Weight: 95.3 kg 92.2 kg     Data Reviewed:   CBC: Recent Labs  Lab 07/22/19 1854 07/23/19 0228 07/25/19 0200 07/26/19 0542 07/27/19 0307 07/28/19 0310 07/29/19 0304  WBC 8.0   < > 10.1 7.8 8.2 9.9 8.4  NEUTROABS 6.4  --   --   --   --   --  7.1  HGB 14.8   < > 12.7* 11.8* 11.3* 11.6* 11.7*  HCT 43.5   < > 38.7* 34.6* 34.3* 34.9* 35.1*  MCV 104.1*   < > 106.9* 106.1* 107.2* 106.7* 105.7*  PLT 256   < > 170 142* 151 157 155   < > = values in this interval not displayed.   Basic Metabolic Panel: Recent Labs  Lab 07/24/19 0036 07/24/19 0036 07/25/19 7322 07/26/19 0542 07/27/19 0307 07/28/19 0310 07/29/19 0304  NA 143   < > 139 136 137 135 134*  K 3.6   < > 3.4* 3.8 3.7 3.5 3.2*  CL 94*   < > 91* 95* 94* 91* 88*  CO2 38*   < > 35* 29 32 32 31  GLUCOSE 122*   < > 131* 147* 94 93 133*  BUN 33*   < > 38* 42* 37* 42* 42*  CREATININE 1.14   < > 1.41* 1.39* 0.94 1.12 1.11  CALCIUM 8.7*   < > 8.8* 8.1* 8.6* 8.4* 8.0*  MG 2.0  --  1.7  --  2.0 2.0 1.7  PHOS  --   --   --   --   --   --  3.1   < > = values in this interval not displayed.   GFR: Estimated Creatinine Clearance: 65 mL/min (by C-G formula based on SCr of 1.11 mg/dL). Liver Function Tests: Recent Labs  Lab 07/22/19 1854  AST 21  ALT 18  ALKPHOS 68  BILITOT 2.1*  PROT 7.4  ALBUMIN 4.4   Recent Labs  Lab 07/22/19 1854  LIPASE 19   No results for input(s): AMMONIA in the last 168 hours. Coagulation Profile: Recent Labs  Lab 07/24/19 0036  INR 1.1   Cardiac  Enzymes: No results for input(s): CKTOTAL, CKMB, CKMBINDEX, TROPONINI in the last 168 hours. BNP (last 3 results) No results for input(s): PROBNP in the last 8760 hours. HbA1C: Recent Labs    07/28/19 1018  HGBA1C 5.2   CBG: Recent Labs  Lab 07/28/19 1652 07/28/19 1915 07/29/19 0102 07/29/19 0607 07/29/19 1114  GLUCAP 84 105* 131* 165* 171*   Lipid Profile: Recent Labs    07/29/19 0304  TRIG 107   Thyroid Function Tests: No results for input(s): TSH, T4TOTAL, FREET4, T3FREE, THYROIDAB in the last 72 hours. Anemia Panel: No results for input(s): VITAMINB12, FOLATE, FERRITIN, TIBC, IRON, RETICCTPCT in the last 72 hours. Sepsis Labs: Recent  Labs  Lab 07/23/19 0839 07/23/19 1041 07/28/19 0513  PROCALCITON  --   --  0.85  LATICACIDVEN 1.2 1.2  --     Recent Results (from the past 240 hour(s))  SARS Coronavirus 2 by RT PCR (hospital order, performed in [facility] hospital lab) Nasopharyngeal Nasopharyngeal Swab     Status: None   Collection Time: 07/22/19  6:54 PM   Specimen: Nasopharyngeal Swab  Result Value Ref Range Status   SARS Coronavirus 2 NEGATIVE NEGATIVE Final    Comment: (NOTE) SARS-CoV-2 target nucleic acids are NOT DETECTED. The SARS-CoV-2 RNA is generally detectable in upper and lower respiratory specimens during the acute phase of infection. The lowest concentration of SARS-CoV-2 viral copies this assay can detect is 250 copies / mL. A negative result does not preclude SARS-CoV-2 infection and should not be used as the sole basis for treatment or other patient management decisions.  A negative result may occur with improper specimen collection / handling, submission of specimen other than nasopharyngeal swab, presence of viral mutation(s) within the areas targeted by this assay, and inadequate number of viral copies (<250 copies / mL). A negative result must be combined with clinical observations, patient history, and epidemiological  information. Fact Sheet for Patients:   StrictlyIdeas.no Fact Sheet for Healthcare Providers: BankingDealers.co.za This test is not yet approved or cleared  by the [place] FDA and has been authorized for detection and/or diagnosis of SARS-CoV-2 by FDA under an Emergency Use Authorization (EUA).  This EUA will remain in effect (meaning this test can be used) for the duration of the COVID-19 declaration under Section 564(b)(1) of the Act, 21 U.S.C. section 360bbb-3(b)(1), unless the authorization is terminated or revoked sooner. Performed at [facility], [address] [place], [place] [address]   MRSA PCR Screening     Status: None   Collection Time: 07/22/19 11:25 PM   Specimen: Nasal Mucosa; Nasopharyngeal  Result Value Ref Range Status   MRSA by PCR NEGATIVE NEGATIVE Final    Comment:        The GeneXpert MRSA Assay (FDA approved for NASAL specimens only), is one component of a comprehensive MRSA colonization surveillance program. It is not intended to diagnose MRSA infection nor to guide or monitor treatment for MRSA infections. Performed at [facility], [place] [address], [place], [place] [address]          Radiology Studies: [facility] Chest Port 1 View  Result Date: 07/29/2019 CLINICAL DATA:  Shortness of breath EXAM: PORTABLE CHEST 1 VIEW COMPARISON:  07/28/2019 FINDINGS: Cardiac shadow is stable. Left-sided PICC line is again noted at the cavoatrial junction. Gastric catheter has been removed in the interval. Improved aeration is noted in the bases bilaterally. No new focal confluent infiltrate is seen. No bony abnormality is noted. IMPRESSION: Improved aeration in the bases.  No acute abnormality noted. Electronically Signed   By: Inez Catalina M.D.   On: 07/29/2019 08:23   DG CHEST PORT 1 VIEW  Result Date: 07/28/2019 CLINICAL DATA:  Central venous catheter placement EXAM: PORTABLE CHEST 1  VIEW COMPARISON:  July 28, 2019 study obtained earlier in the day FINDINGS: Central catheter tip is at the cavoatrial junction. Nasogastric tube tip and side port are in the stomach. No pneumothorax. There is airspace consolidation in the lung bases, more severe on the right than on the left, stable. No new opacity evident. Heart size and pulmonary vascularity are normal. No adenopathy. There is aortic atherosclerosis.  No bone lesions. IMPRESSION: Tube and catheter positions as described without pneumothorax. Bibasilar consolidation, more severe on the right than the left, stable. Cardiac silhouette within normal limits. Aortic Atherosclerosis (ICD10-I70.0). Electronically Signed   By: Lowella Grip III M.D.   On: 07/28/2019 19:20   DG CHEST PORT 1 VIEW  Result Date: 07/28/2019 CLINICAL DATA:  Central venous catheter placement EXAM: PORTABLE CHEST 1 VIEW COMPARISON:  07/28/2019 FINDINGS: Single frontal view of the chest demonstrates left-sided PICC tip overlying superior vena cava. Enteric catheter passes below diaphragm side port projecting over gastric fundus. Tip is excluded by collimation. Cardiac silhouette is stable. There is persistent central vascular congestion with bibasilar consolidation, right greater than left. No effusion or pneumothorax. IMPRESSION: 1. Support devices as above. 2. Persistent central vascular congestion and bibasilar consolidation, stable. Electronically Signed   By: Randa Ngo M.D.   On: 07/28/2019 16:54   DG CHEST PORT 1 VIEW  Result Date: 07/28/2019 CLINICAL DATA:  Shortness of breath, cough EXAM: PORTABLE CHEST 1 VIEW COMPARISON:  Radiograph 07/27/2018 FINDINGS: Transesophageal tube tip distal to the GE junction, terminating below the level of imaging. Telemetry leads overlie the chest. Lung volumes are low with some streaky basilar opacities favoring atelectasis. Increasing opacity in the right lung base could reflect worsening atelectasis or developing airspace  disease. Central vascular crowding is noted. No convincing features of edema. The aorta is calcified. The remaining cardiomediastinal contours are unremarkable. No acute osseous or soft tissue abnormality. Degenerative changes are present in the imaged spine and shoulders. IMPRESSION: Transesophageal tube tip below the GE junction. Low lung volumes with some streaky basilar opacities favoring atelectasis. More patchy increased opacity in the right lung base could reflect further volume loss or developing airspace disease. Electronically Signed   By: Lovena Le M.D.   On: 07/28/2019 04:08   [place] EKG SITE RITE  Result Date: 07/28/2019 If Site Rite image not attached, placement could not be confirmed due to current cardiac rhythm.       Scheduled Meds: . budesonide (PULMICORT) nebulizer solution  0.5 mg Nebulization BID  . chlorhexidine  15 mL Mouth Rinse BID  . Chlorhexidine Gluconate Cloth  6 each Topical Daily  . enoxaparin (LOVENOX) injection  1 mg/kg Subcutaneous Q12H  . furosemide  40 mg Intravenous BID  . insulin aspart  0-15 Units Subcutaneous Q6H  . ipratropium  0.5 mg Nebulization BID  . levalbuterol  0.63 mg Nebulization BID  . mouth rinse  15 mL Mouth Rinse BID   Continuous Infusions: . diltiazem (CARDIZEM) infusion 15 mg/hr (07/29/19 0921)  . piperacillin-tazobactam (ZOSYN)  IV Stopped (07/29/19 0934)  . potassium chloride 10 mEq (07/29/19 1058)  . TPN ADULT (ION) 40 mL/hr at 07/29/19 0534  . vancomycin Stopped (07/29/19 1638)     LOS: 7 days   Time spent= 35 mins    Alexus Michael Arsenio Loader, MD [facility]  If 7PM-7AM, please contact night-coverage  07/29/2019, 11:39 AM

## 2019-07-29 NOTE — Progress Notes (Signed)
Physical Therapy Treatment Patient Details Name: Jake [name] MRN: [id number] DOB: 1945-12-11 Today's Date: 07/29/2019    History of Present Illness Pt admitted with SBO and now s/p Exploratory Laparotomy and Enterolysis Decompressive Enterotomy and Small Bowel Anastomosis.  Pt with hx of COPD, PAF, RA, AAA, and back surgery    PT Comments    The patient is slowly progressing with mobility, Has multiple lines but willing to mobilize. Continue PT.  Follow Up Recommendations  Home health PT;Supervision/Assistance - 24 hour     Equipment Recommendations  None recommended by PT    Recommendations for Other Services OT consult     Precautions / Restrictions Precautions Precautions: None Precaution Comments: NG tube in place    Mobility  Bed Mobility   Bed Mobility: Supine to Sit     Supine to sit: HOB elevated;Min assist     General bed mobility comments: patient moved legs and  sat up with min assistance  Transfers   Equipment used: Rolling walker (2 wheeled) Transfers: Sit to/from Stand Sit to Stand: Min assist;Mod assist;+2 physical assistance;+2 safety/equipment;From elevated surface         General transfer comment: cues for use of UEs to self assist;  Physical assist to bring wt up and fwd and to balance in initial standing  Ambulation/Gait                 Stairs             Wheelchair Mobility    Modified Rankin (Stroke Patients Only)       Balance Overall balance assessment: Needs assistance   Sitting balance-Leahy Scale: Fair     Standing balance support: Bilateral upper extremity supported Standing balance-Leahy Scale: Poor                              Cognition Arousal/Alertness: Awake/alert Behavior During Therapy: Flat affect Overall Cognitive Status: Difficult to assess                                 General Comments: follows directions when spoken  into ear      Exercises       General Comments        Pertinent Vitals/Pain Pain Assessment: Faces Pain Location: abdomen Pain Descriptors / Indicators: Aching;Grimacing;Guarding;Moaning;Sore Pain Intervention(s): Monitored during session    Home Living                      Prior Function            PT Goals (current goals can now be found in the care plan section) Progress towards PT goals: Progressing toward goals    Frequency    Min 3X/week      PT Plan Current plan remains appropriate    Co-evaluation              AM-PAC PT "6 Clicks" Mobility   Outcome Measure  Help needed turning from your back to your side while in a flat bed without using bedrails?: A Lot Help needed moving from lying on your back to sitting on the side of a flat bed without using bedrails?: A Lot Help needed moving to and from a bed to a chair (including a wheelchair)?: A Lot Help needed standing up from a chair using your arms (e.g., wheelchair or bedside chair)?: A Lot  Help needed to walk in hospital room?: Total Help needed climbing 3-5 steps with a railing? : Total 6 Click Score: 10    End of Session Equipment Utilized During Treatment: Gait belt Activity Tolerance: Patient tolerated treatment well Patient left: in chair;with call bell/phone within reach Nurse Communication: Mobility status PT Visit Diagnosis: Unsteadiness on feet (R26.81);Muscle weakness (generalized) (M62.81);Difficulty in walking, not elsewhere classified (R26.2);Pain     Time: 2595-6387 PT Time Calculation (min) (ACUTE ONLY): 19 min  Charges:  $Therapeutic Activity: 8-22 mins                     Tresa Endo PT Acute Rehabilitation Services Pager [phone] Office [phone]    Claretha Cooper 07/29/2019, 1:52 PM

## 2019-07-29 NOTE — Progress Notes (Signed)
[facility] Progress Note  4 Days Post-Op  Subjective: Cc: mucous in his throat/throat discomfort.  NG pulled out this AM. Over 1300 cc feculent output in 24h. Patient reports small amt flatus yesterday but no reliable flatus this AM. Denies BM.   afebrile Currently in afib with RVR on 15mg /hr Cardizem Required 2L O2 overnight, RN weaning this AM  CXR today with improvement in pulmonary edema s/p lasix  Objective: Vital signs in last 24 hours: Temp:  [97.9 F (36.6 C)-98.9 F (37.2 C)] 97.9 F (36.6 C) (06/08 0315) Pulse Rate:  [64-117] 94 (06/08 0600) Resp:  [16-25] 21 (06/08 0600) BP: (99-133)/(39-88) 132/56 (06/08 0450) SpO2:  [88 %-100 %] 96 % (06/08 0719) Last BM Date: 07/19/19  Intake/Output from previous day: 06/07 0701 - 06/08 0700 In: 1945.1 [P.O.:480; I.V.:967; IV Piggyback:496.2] Out: 4225 [Urine:2900; Emesis/NG output:1325] Intake/Output this shift: No intake/output data recorded.  PE: General: pleasant, WD, chronically ill appearing white male laying in bed in NAD Heart: irregularly irregular 120bpm during my exam, DP 2+ BL Lungs: CTAB Abd: soft, distended, +BS hypoactive, appropriately TTP without guarding, incision with dressing in place c/d/i  GU: condom cath in place with clear, yellow urine   Lab Results:  Recent Labs    07/28/19 0310 07/29/19 0304  WBC 9.9 8.4  HGB 11.6* 11.7*  HCT 34.9* 35.1*  PLT 157 155   BMET Recent Labs    07/28/19 0310 07/29/19 0304  NA 135 134*  K 3.5 3.2*  CL 91* 88*  CO2 32 31  GLUCOSE 93 133*  BUN 42* 42*  CREATININE 1.12 1.11  CALCIUM 8.4* 8.0*   PT/INR No results for input(s): LABPROT, INR in the last 72 hours. CMP     Component Value Date/Time   NA 134 (L) 07/29/2019 0304   K 3.2 (L) 07/29/2019 0304   CL 88 (L) 07/29/2019 0304   CO2 31 07/29/2019 0304   GLUCOSE 133 (H) 07/29/2019 0304   BUN 42 (H) 07/29/2019 0304   CREATININE 1.11 07/29/2019 0304   CALCIUM 8.0 (L) 07/29/2019 0304    PROT 7.4 07/22/2019 1854   ALBUMIN 4.4 07/22/2019 1854   AST 21 07/22/2019 1854   ALT 18 07/22/2019 1854   ALKPHOS 68 07/22/2019 1854   BILITOT 2.1 (H) 07/22/2019 1854   GFRNONAA >60 07/29/2019 0304   GFRAA >60 07/29/2019 0304   Lipase     Component Value Date/Time   LIPASE 19 07/22/2019 1854       Studies/Results: DG CHEST PORT 1 VIEW  Result Date: 07/28/2019 CLINICAL DATA:  Central venous catheter placement EXAM: PORTABLE CHEST 1 VIEW COMPARISON:  July 28, 2019 study obtained earlier in the day FINDINGS: Central catheter tip is at the cavoatrial junction. Nasogastric tube tip and side port are in the stomach. No pneumothorax. There is airspace consolidation in the lung bases, more severe on the right than on the left, stable. No new opacity evident. Heart size and pulmonary vascularity are normal. No adenopathy. There is aortic atherosclerosis. No bone lesions. IMPRESSION: Tube and catheter positions as described without pneumothorax. Bibasilar consolidation, more severe on the right than the left, stable. Cardiac silhouette within normal limits. Aortic Atherosclerosis (ICD10-I70.0). Electronically Signed   By: Lowella Grip III M.D.   On: 07/28/2019 19:20   DG CHEST PORT 1 VIEW  Result Date: 07/28/2019 CLINICAL DATA:  Central venous catheter placement EXAM: PORTABLE CHEST 1 VIEW COMPARISON:  07/28/2019 FINDINGS: Single frontal view of the chest demonstrates left-sided PICC  tip overlying superior vena cava. Enteric catheter passes below diaphragm side port projecting over gastric fundus. Tip is excluded by collimation. Cardiac silhouette is stable. There is persistent central vascular congestion with bibasilar consolidation, right greater than left. No effusion or pneumothorax. IMPRESSION: 1. Support devices as above. 2. Persistent central vascular congestion and bibasilar consolidation, stable. Electronically Signed   By: Randa Ngo M.D.   On: 07/28/2019 16:54   DG CHEST PORT 1  VIEW  Result Date: 07/28/2019 CLINICAL DATA:  Shortness of breath, cough EXAM: PORTABLE CHEST 1 VIEW COMPARISON:  Radiograph 07/27/2018 FINDINGS: Transesophageal tube tip distal to the GE junction, terminating below the level of imaging. Telemetry leads overlie the chest. Lung volumes are low with some streaky basilar opacities favoring atelectasis. Increasing opacity in the right lung base could reflect worsening atelectasis or developing airspace disease. Central vascular crowding is noted. No convincing features of edema. The aorta is calcified. The remaining cardiomediastinal contours are unremarkable. No acute osseous or soft tissue abnormality. Degenerative changes are present in the imaged spine and shoulders. IMPRESSION: Transesophageal tube tip below the GE junction. Low lung volumes with some streaky basilar opacities favoring atelectasis. More patchy increased opacity in the right lung base could reflect further volume loss or developing airspace disease. Electronically Signed   By: Lovena Le M.D.   On: 07/28/2019 04:08   DG Chest Port 1 View  Result Date: 07/27/2019 CLINICAL DATA:  Dyspnea, post exploratory laparotomy, small-bowel resection, and enterolysis; past history hypertension, COPD, atrial fibrillation EXAM: PORTABLE CHEST 1 VIEW COMPARISON:  Portable exam 0758 hours compared to 05/29/2019 FINDINGS: Nasogastric tube extends into abdomen. Upper normal heart size. Mediastinal contours and pulmonary vascularity normal. EKG leads project over chest. Decreased lung volumes with bibasilar atelectasis. No definite infiltrate, pleural effusion or pneumothorax. IMPRESSION: Decreased lung volumes with bibasilar atelectasis. Electronically Signed   By: Lavonia Dana M.D.   On: 07/27/2019 10:11   [place] EKG SITE RITE  Result Date: 07/28/2019 If Site Rite image not attached, placement could not be confirmed due to current cardiac rhythm.   Anti-infectives: Anti-infectives (From admission, onward)    Start     Dose/Rate Route Frequency Ordered Stop   07/28/19 1800  vancomycin (VANCOCIN) IVPB 1000 mg/200 mL premix     1,000 mg 200 mL/hr over 60 Minutes Intravenous Every 12 hours 07/28/19 0621     07/28/19 0445  vancomycin (VANCOREADY) IVPB 2000 mg/400 mL     2,000 mg 200 mL/hr over 120 Minutes Intravenous  Once 07/28/19 0439 07/28/19 0824   07/28/19 0445  piperacillin-tazobactam (ZOSYN) IVPB 3.375 g     3.375 g 12.5 mL/hr over 240 Minutes Intravenous Every 8 hours 07/28/19 0439     07/25/19 0915  ceFAZolin (ANCEF) IVPB 2g/100 mL premix     2 g 200 mL/hr over 30 Minutes Intravenous On call 07/25/19 0908 07/25/19 1307       Assessment/Plan COPD/100+ pack year history -currently @ 1/2 PPD Rheumatoid arthritis Atrial fibrillation - controlled on cardizem gtt, hep gtt Dehydration/acute renal failure - Cr 1.41 from 1.14 PMH RA on methotrexate   Small bowel obstruction with history of colon resection in 1980s S/p ex lap, enterolysis, decompressive enterotomy and SB anastomosis 6/4 Dr. Hassell Done - POD#4, afebrile, WBC 8 - RN to replace NGT - continue to await return of bowel function - OOB, PT/OT - IS 10x q 1h, add flutter valve  - wet-to-dry dressing changes BID to abd wound and monitor   FEN: N.p.o./IV fluids,  continue PICC/TNA ID: Zosyn 6/7 , started by primary team for potential HCAP VTE: heparin gtt GU: condom cath Follow-up: Dr. Hassell Done    LOS: 7 days    Jill Alexanders , [facility] 07/29/2019, 7:52 AM Please see Amion for pager number during day hours 7:00am-4:30pm

## 2019-07-29 NOTE — Progress Notes (Signed)
Progress Note  Patient Name: Jake [name] Date of Encounter: 07/29/2019  Primary Cardiologist:  No primary care provider on file.  Dr Claudie Leach from [facility] (assigned to him, he has seen [name], [facility])  Subjective   Remains in atrial fibrillation in the upper 90's.  Now on Cardizem gtt at 15mg /hr.  Denies any CP or SOB>   Inpatient Medications    Scheduled Meds: . budesonide (PULMICORT) nebulizer solution  0.5 mg Nebulization BID  . chlorhexidine  15 mL Mouth Rinse BID  . Chlorhexidine Gluconate Cloth  6 each Topical Daily  . enoxaparin (LOVENOX) injection  1 mg/kg Subcutaneous Q12H  . insulin aspart  0-15 Units Subcutaneous Q6H  . ipratropium  0.5 mg Nebulization BID  . levalbuterol  0.63 mg Nebulization BID  . mouth rinse  15 mL Mouth Rinse BID   Continuous Infusions: . diltiazem (CARDIZEM) infusion 15 mg/hr (07/29/19 0921)  . piperacillin-tazobactam (ZOSYN)  IV Stopped (07/29/19 0934)  . potassium chloride 10 mEq (07/29/19 1058)  . TPN ADULT (ION) 40 mL/hr at 07/29/19 0534  . vancomycin Stopped (07/29/19 0634)   PRN Meds: guaiFENesin-dextromethorphan, HYDROmorphone (DILAUDID) injection, labetalol, levalbuterol, methocarbamol, ondansetron **OR** ondansetron (ZOFRAN) IV, polyethylene glycol, senna-docusate   Vital Signs    Vitals:   07/29/19 0800 07/29/19 0825 07/29/19 0900 07/29/19 1000  BP: 123/63   124/76  Pulse: (!) 104  94 87  Resp: 20  15 19   Temp:  99 F (37.2 C)    TempSrc:  Axillary    SpO2: 95%  94% 98%  Weight:      Height:        Intake/Output Summary (Last 24 hours) at 07/29/2019 1102 Last data filed at 07/29/2019 6761 Gross per 24 hour  Intake 2273.85 ml  Output 4150 ml  Net -1876.15 ml   Filed Weights   07/22/19 1812 07/22/19 2330  Weight: 95.3 kg 92.2 kg   Last Weight  Most recent update: 07/22/2019 11:56 PM   Weight  92.2 kg (203 lb 4.2 oz)           Weight change:    Telemetry   atrial fibrillation with CVR in the 90's-  Personally Reviewed  ECG    No new EKG to review- Personally Reviewed  Physical Exam   GEN: Well nourished, well developed in no acute distress HEENT: Normal NECK: No JVD; No carotid bruits LYMPHATICS: No lymphadenopathy CARDIAC:irregularly irregular, no murmurs, rubs, gallops RESPIRATORY:  Diffuse scattered wheezes ABDOMEN: Soft, non-tender, non-distended MUSCULOSKELETAL:  No edema; No deformity  SKIN: Warm and dry NEUROLOGIC:  Alert and oriented x 3 PSYCHIATRIC:  Normal affect    Labs    Hematology Recent Labs  Lab 07/27/19 0307 07/28/19 0310 07/29/19 0304  WBC 8.2 9.9 8.4  RBC 3.20* 3.27* 3.32*  HGB 11.3* 11.6* 11.7*  HCT 34.3* 34.9* 35.1*  MCV 107.2* 106.7* 105.7*  MCH 35.3* 35.5* 35.2*  MCHC 32.9 33.2 33.3  RDW 13.8 13.9 13.7  PLT 151 157 155    Chemistry Recent Labs  Lab 07/22/19 1854 07/23/19 0300 07/27/19 0307 07/28/19 0310 07/29/19 0304  NA 140   < > 137 135 134*  K 3.5   < > 3.7 3.5 3.2*  CL 91*   < > 94* 91* 88*  CO2 32   < > 32 32 31  GLUCOSE 143*   < > 94 93 133*  BUN 36*   < > 37* 42* 42*  CREATININE 1.37*   < > 0.94  1.12 1.11  CALCIUM 9.1   < > 8.6* 8.4* 8.0*  PROT 7.4  --   --   --   --   ALBUMIN 4.4  --   --   --   --   AST 21  --   --   --   --   ALT 18  --   --   --   --   ALKPHOS 68  --   --   --   --   BILITOT 2.1*  --   --   --   --   GFRNONAA 50*   < > >60 >60 >60  GFRAA 58*   < > >60 >60 >60  ANIONGAP 17*   < > 11 12 15    < > = values in this interval not displayed.     High Sensitivity Troponin:   Recent Labs  Lab 07/23/19 0300  TROPONINIHS 24*      BNP Recent Labs  Lab 07/27/19 0751 07/29/19 0308  BNP 269.2* 378.9*     DDimer No results for input(s): DDIMER in the last 168 hours.   Radiology    DG Chest Port 1 View  Result Date: 07/29/2019 CLINICAL DATA:  Shortness of breath EXAM: PORTABLE CHEST 1 VIEW COMPARISON:  07/28/2019 FINDINGS: Cardiac shadow is stable. Left-sided PICC line is again noted at the  cavoatrial junction. Gastric catheter has been removed in the interval. Improved aeration is noted in the bases bilaterally. No new focal confluent infiltrate is seen. No bony abnormality is noted. IMPRESSION: Improved aeration in the bases.  No acute abnormality noted. Electronically Signed   By: Inez Catalina M.D.   On: 07/29/2019 08:23   DG CHEST PORT 1 VIEW  Result Date: 07/28/2019 CLINICAL DATA:  Central venous catheter placement EXAM: PORTABLE CHEST 1 VIEW COMPARISON:  July 28, 2019 study obtained earlier in the day FINDINGS: Central catheter tip is at the cavoatrial junction. Nasogastric tube tip and side port are in the stomach. No pneumothorax. There is airspace consolidation in the lung bases, more severe on the right than on the left, stable. No new opacity evident. Heart size and pulmonary vascularity are normal. No adenopathy. There is aortic atherosclerosis. No bone lesions. IMPRESSION: Tube and catheter positions as described without pneumothorax. Bibasilar consolidation, more severe on the right than the left, stable. Cardiac silhouette within normal limits. Aortic Atherosclerosis (ICD10-I70.0). Electronically Signed   By: Lowella Grip III M.D.   On: 07/28/2019 19:20   DG CHEST PORT 1 VIEW  Result Date: 07/28/2019 CLINICAL DATA:  Central venous catheter placement EXAM: PORTABLE CHEST 1 VIEW COMPARISON:  07/28/2019 FINDINGS: Single frontal view of the chest demonstrates left-sided PICC tip overlying superior vena cava. Enteric catheter passes below diaphragm side port projecting over gastric fundus. Tip is excluded by collimation. Cardiac silhouette is stable. There is persistent central vascular congestion with bibasilar consolidation, right greater than left. No effusion or pneumothorax. IMPRESSION: 1. Support devices as above. 2. Persistent central vascular congestion and bibasilar consolidation, stable. Electronically Signed   By: Randa Ngo M.D.   On: 07/28/2019 16:54   DG CHEST  PORT 1 VIEW  Result Date: 07/28/2019 CLINICAL DATA:  Shortness of breath, cough EXAM: PORTABLE CHEST 1 VIEW COMPARISON:  Radiograph 07/27/2018 FINDINGS: Transesophageal tube tip distal to the GE junction, terminating below the level of imaging. Telemetry leads overlie the chest. Lung volumes are low with some streaky basilar opacities favoring atelectasis. Increasing opacity in the right lung  base could reflect worsening atelectasis or developing airspace disease. Central vascular crowding is noted. No convincing features of edema. The aorta is calcified. The remaining cardiomediastinal contours are unremarkable. No acute osseous or soft tissue abnormality. Degenerative changes are present in the imaged spine and shoulders. IMPRESSION: Transesophageal tube tip below the GE junction. Low lung volumes with some streaky basilar opacities favoring atelectasis. More patchy increased opacity in the right lung base could reflect further volume loss or developing airspace disease. Electronically Signed   By: Lovena Le M.D.   On: 07/28/2019 04:08   DG Chest Port 1 View  Result Date: 07/27/2019 CLINICAL DATA:  Dyspnea, post exploratory laparotomy, small-bowel resection, and enterolysis; past history hypertension, COPD, atrial fibrillation EXAM: PORTABLE CHEST 1 VIEW COMPARISON:  Portable exam 0758 hours compared to 05/29/2019 FINDINGS: Nasogastric tube extends into abdomen. Upper normal heart size. Mediastinal contours and pulmonary vascularity normal. EKG leads project over chest. Decreased lung volumes with bibasilar atelectasis. No definite infiltrate, pleural effusion or pneumothorax. IMPRESSION: Decreased lung volumes with bibasilar atelectasis. Electronically Signed   By: Lavonia Dana M.D.   On: 07/27/2019 10:11   [place] EKG SITE RITE  Result Date: 07/28/2019 If Site Rite image not attached, placement could not be confirmed due to current cardiac rhythm.    Cardiac Studies   ECHO:  07/22/2019 1. Left  ventricular ejection fraction, by estimation, is 55 to 60%. The  left ventricle has normal function. The left ventricle has no regional  wall motion abnormalities. Left ventricular diastolic parameters are  indeterminate.  2. Right ventricular systolic function is normal. The right ventricular  size is mildly enlarged. There is mildly elevated pulmonary artery  systolic pressure. The estimated right ventricular systolic pressure is  65.7 mmHg.  3. The mitral valve is normal in structure. Trivial mitral valve  regurgitation. No evidence of mitral stenosis.  4. The aortic valve is tricuspid. Aortic valve regurgitation is not  visualized. No aortic stenosis is present.  5. The inferior vena cava is normal in size with greater than 50%  respiratory variability, suggesting right atrial pressure of 3 mmHg.  6. The patient was in atrial fibrillation.   Patient Profile     74 y.o. 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  Assessment & Plan    1. SBO - still complains of abdominal pain - s/p lab chole with enterolysis and decompressive enterotomy with SB anastamosis - pulled NGT out and being replaced - awaiting return of bowel function - surgery following  2. Atrial fib, RVR -HR improved since yesterday after placing back on Cardizem gtt>>? Poor absorption of PO -continue IV Cardizem for now until return of bowel function -avoid BB in setting of active wheezing -if cannot get HR under control may need to consider IV Amio -Mr. Valeriano could not afford Xarelto and he does not remember discussing Coumadin with his PCP. -he was taking baby aspirin a few times a week. -Currently on IV Heparin gtt and recommend changing to Coumadin once ok by surgery- CHA2DS2-VASc is 3 (age x 2, HTN)  3. COPD/exacerbation/Acute respiratory  failure with hypoxia - continue to have wheezing and TRH starting Lasix -concern for HCAP and likely CHF -getting nebs and antibx -per TRH  4.  HTN -BP controlled at 124/84mmHg -continue IV Cardizem gtt  for now -he was on Lisinopril and HCTZ at home currently on hold  5.  Acute diastolic CHF -BNP mildly elevated although Cxray with no edema -likely related to afib with RVR recently -he has put out 2.9L UOP yesterday and is net + 566 -fluids have been stopped -creatinine stable at 1.11 -BNP elevated at 378 today which is trending upward.  Cxray with no edema -continue lasix 40mg  IV BID and follow up renal function, weights and I&O's closely  I have spent a total of 35 minutes with patient reviewing hospital notes , telemetry, EKGs, labs and examining patient as well as establishing an assessment and plan that was discussed with the patient.  > 50% of time was spent in direct patient care.     Signed, Fransico Him, MD 11:02 AM 07/29/2019

## 2019-07-29 NOTE — Progress Notes (Signed)
Nutrition Follow-up  DOCUMENTATION CODES:   Not applicable  INTERVENTION:  - continue TPN per Pharmacist.  - weigh patient today.   Monitor magnesium, potassium, and phosphorus daily for at least 3 days, MD to replete as needed, as pt is at risk for refeeding syndrome given no nutrition x1 week, current hypokalemia.   NUTRITION DIAGNOSIS:   Inadequate oral intake related to inability to eat as evidenced by NPO status. -ongoing  GOAL:   Patient will meet greater than or equal to 90% of their needs -unmet with current TPN rate  MONITOR:   Diet advancement, Labs, Weight trends, I & O's, Other (Comment)(TPN regimen)  ASSESSMENT:   74 y.o. male with medical history of HTN, rheumatoid arthritis, and COPD. He presented to the ED with abdominal pain and N/V x2 days. His last BM PTA was 3 days PTA.  Significant Events: 6/1- admission 6/4- ex lap, enterolysis, decompressive enterotomy; NGT placed in L nare (gastric) 6/7- double lumen PICC placed in L basilic; TPN initiation   NGT accidentally pulled earlier this AM. Has been replaced by RN and xray pending reading. Able to talk with RN. He is able to have ice chips and was requesting some a short time ago.   Able to talk with Pharmacist this AM concerning estimated nutrition needs and updates have been made following conversation. He has not been weighed since 6/1. He is currently receiving custom TPN via PICC at 40 ml/hr.  Per notes: - 1300 ml feculent output in the past 24 hours    Labs reviewed; CBGs: 131 and 165 mg/dl, Na: 134 mmol/l, K: 3.2 mmol/l, Cl: 88 mmol/l, BUN: 42 mg/dl, Ca: 8 mg/dl. Medications reviewed; 40 mg IV lasix x2 doses 6/7, sliding scale novolog, 2 g IV Mg sulfate x1 run 6/8, 10 mEq IV KCl x4 runs 6/7 and x4 runs 6/8.   Diet Order:   Diet Order            Diet NPO time specified Except for: Ice Chips  Diet effective now              EDUCATION NEEDS:   No education needs have been identified at  this time  Skin:  Skin Assessment: Skin Integrity Issues: Skin Integrity Issues:: Incisions Incisions: abdomen (6/4)  Last BM:  5/29  Height:   Ht Readings from Last 1 Encounters:  07/22/19 6\' 1"  (1.854 m)    Weight:   Wt Readings from Last 1 Encounters:  07/22/19 92.2 kg    Estimated Nutritional Needs:  Kcal:  2120-2305 kcal Protein:  105-120 grams Fluid:  >/= 2 L/day     Jarome Matin, MS, RD, LDN, CNSC Inpatient Clinical Dietitian RD pager # available in [place]  After hours/weekend pager # available in [facility]

## 2019-07-30 LAB — GLUCOSE, CAPILLARY
Glucose-Capillary: 181 mg/dL — ABNORMAL HIGH (ref 70–99)
Glucose-Capillary: 133 mg/dL — ABNORMAL HIGH (ref 70–99)
Glucose-Capillary: 138 mg/dL — ABNORMAL HIGH (ref 70–99)
Glucose-Capillary: 138 mg/dL — ABNORMAL HIGH (ref 70–99)

## 2019-07-30 LAB — COMPREHENSIVE METABOLIC PANEL
ALT: 15 U/L (ref 0–44)
AST: 22 U/L (ref 15–41)
Albumin: 2.7 g/dL — ABNORMAL LOW (ref 3.5–5.0)
Alkaline Phosphatase: 43 U/L (ref 38–126)
Anion gap: 16 — ABNORMAL HIGH (ref 5–15)
BUN: 40 mg/dL — ABNORMAL HIGH (ref 8–23)
CO2: 40 mmol/L — ABNORMAL HIGH (ref 22–32)
Calcium: 8.4 mg/dL — ABNORMAL LOW (ref 8.9–10.3)
Chloride: 84 mmol/L — ABNORMAL LOW (ref 98–111)
Creatinine, Ser: 1.04 mg/dL (ref 0.61–1.24)
GFR calc Af Amer: >60 mL/min (ref >60)
GFR calc non Af Amer: >60 mL/min (ref >60)
Glucose, Bld: 117 mg/dL — ABNORMAL HIGH (ref 70–99)
Potassium: 3.4 mmol/L — ABNORMAL LOW (ref 3.5–5.1)
Sodium: 140 mmol/L (ref 135–145)
Total Bilirubin: 1.7 mg/dL — ABNORMAL HIGH (ref 0.3–1.2)
Total Protein: 5.9 g/dL — ABNORMAL LOW (ref 6.5–8.1)

## 2019-07-30 LAB — MAGNESIUM: Magnesium: 2.1 mg/dL (ref 1.7–2.4)

## 2019-07-30 MED ORDER — SODIUM CHLORIDE 0.9% FLUSH
10.0000 mL | Freq: Two times a day (BID) | INTRAVENOUS | Status: DC
  Administered 2019-07-30 – 2019-08-03 (×7): 10 mL
  Administered 2019-08-03: 40 mL
  Administered 2019-08-04 – 2019-08-06 (×4): 10 mL

## 2019-07-30 MED ORDER — POTASSIUM CHLORIDE 10 MEQ/100ML IV SOLN
10.0000 meq | INTRAVENOUS | Status: AC
  Administered 2019-07-30 (×2): 10 meq via INTRAVENOUS
  Filled 2019-07-30 (×2): qty 100

## 2019-07-30 MED ORDER — SODIUM CHLORIDE 0.9% FLUSH
10.0000 mL | INTRAVENOUS | Status: DC | PRN
  Administered 2019-07-31: 10 mL

## 2019-07-30 MED ORDER — TRACE MINERALS CU-MN-SE-ZN 300-55-60-3000 MCG/ML IV SOLN
INTRAVENOUS | Status: AC
  Filled 2019-07-30: qty 780

## 2019-07-30 MED ORDER — INSULIN ASPART 100 UNIT/ML ~~LOC~~ SOLN
0.0000 [IU] | SUBCUTANEOUS | Status: DC
  Administered 2019-07-30 (×3): 3 [IU] via SUBCUTANEOUS
  Administered 2019-07-31: 4 [IU] via SUBCUTANEOUS
  Administered 2019-07-31: 3 [IU] via SUBCUTANEOUS
  Administered 2019-07-31: 4 [IU] via SUBCUTANEOUS
  Administered 2019-07-31 (×4): 3 [IU] via SUBCUTANEOUS
  Administered 2019-08-01: 4 [IU] via SUBCUTANEOUS
  Administered 2019-08-01: 3 [IU] via SUBCUTANEOUS
  Administered 2019-08-01: 4 [IU] via SUBCUTANEOUS
  Administered 2019-08-01 (×2): 3 [IU] via SUBCUTANEOUS
  Administered 2019-08-02: 4 [IU] via SUBCUTANEOUS
  Administered 2019-08-02 – 2019-08-03 (×4): 3 [IU] via SUBCUTANEOUS

## 2019-07-30 MED ORDER — MAGNESIUM SULFATE 2 GM/50ML IV SOLN
2.0000 g | Freq: Once | INTRAVENOUS | Status: AC
  Administered 2019-07-30: 2 g via INTRAVENOUS
  Filled 2019-07-30: qty 50

## 2019-07-30 MED ORDER — POTASSIUM CHLORIDE 10 MEQ/100ML IV SOLN
10.0000 meq | INTRAVENOUS | Status: AC
  Administered 2019-07-30 (×5): 10 meq via INTRAVENOUS
  Filled 2019-07-30 (×4): qty 100

## 2019-07-30 NOTE — Progress Notes (Signed)
[facility] Progress Note  5 Days Post-Op  Subjective: NAEO. Reports small amt flatus yesterday, none yet today. Denies BM. States he sat in the chair for 45 mins yesterday.   Objective: Vital signs in last 24 hours: Temp:  [97.2 F (36.2 C)-98.9 F (37.2 C)] 97.2 F (36.2 C) (06/09 0800) Pulse Rate:  [73-104] 104 (06/09 0400) Resp:  [16-24] 16 (06/09 0400) BP: (108-124)/(45-76) 108/62 (06/09 0400) SpO2:  [90 %-98 %] 98 % (06/09 0751) Last BM Date: 07/19/19  Intake/Output from previous day: 06/08 0701 - 06/09 0700 In: 2930.5 [P.O.:480; I.V.:1502.4; IV Piggyback:948.1] Out: 4825 [Urine:2075; Emesis/NG output:2750] Intake/Output this shift: No intake/output data recorded.  PE: General: pleasant, WD, chronically ill appearing white male laying in bed in NAD Heart: irregularly irregular 120bpm during my exam, DP 2+ BL Lungs: CTAB Abd: soft, distended, +BS hypoactive, appropriately TTP without guarding, incision with small amt fibrinous exudate as below, no cellulitis, no purulence, NG with feculent drainage    GU: condom cath in place with clear, yellow urine   Lab Results:  Recent Labs    07/28/19 0310 07/29/19 0304  WBC 9.9 8.4  HGB 11.6* 11.7*  HCT 34.9* 35.1*  PLT 157 155   BMET Recent Labs    07/29/19 1600 07/30/19 0220  NA 138 140  K 3.0* 3.4*  CL 87* 84*  CO2 40* 40*  GLUCOSE 147* 117*  BUN 38* 40*  CREATININE 0.91 1.04  CALCIUM 8.2* 8.4*   PT/INR No results for input(s): LABPROT, INR in the last 72 hours. CMP     Component Value Date/Time   NA 140 07/30/2019 0220   K 3.4 (L) 07/30/2019 0220   CL 84 (L) 07/30/2019 0220   CO2 40 (H) 07/30/2019 0220   GLUCOSE 117 (H) 07/30/2019 0220   BUN 40 (H) 07/30/2019 0220   CREATININE 1.04 07/30/2019 0220   CALCIUM 8.4 (L) 07/30/2019 0220   PROT 5.9 (L) 07/30/2019 0220   ALBUMIN 2.7 (L) 07/30/2019 0220   AST 22 07/30/2019 0220   ALT 15 07/30/2019 0220   ALKPHOS 43 07/30/2019 0220    BILITOT 1.7 (H) 07/30/2019 0220   GFRNONAA >60 07/30/2019 0220   GFRAA >60 07/30/2019 0220   Lipase     Component Value Date/Time   LIPASE 19 07/22/2019 1854       Studies/Results: DG Abd 1 View  Result Date: 07/29/2019 CLINICAL DATA:  NG tube placement EXAM: ABDOMEN - 1 VIEW COMPARISON:  07/25/2019 FINDINGS: NG tube remains in stable position with the tip in the proximal stomach and the side port in the distal esophagus. Multiple dilated loops of small bowel again noted, unchanged. IMPRESSION: NG tube in stable position with the tip in the proximal stomach. Electronically Signed   By: Rolm Baptise M.D.   On: 07/29/2019 11:57   DG Chest Port 1 View  Result Date: 07/29/2019 CLINICAL DATA:  Shortness of breath EXAM: PORTABLE CHEST 1 VIEW COMPARISON:  07/28/2019 FINDINGS: Cardiac shadow is stable. Left-sided PICC line is again noted at the cavoatrial junction. Gastric catheter has been removed in the interval. Improved aeration is noted in the bases bilaterally. No new focal confluent infiltrate is seen. No bony abnormality is noted. IMPRESSION: Improved aeration in the bases.  No acute abnormality noted. Electronically Signed   By: Inez Catalina M.D.   On: 07/29/2019 08:23   DG CHEST PORT 1 VIEW  Result Date: 07/28/2019 CLINICAL DATA:  Central venous catheter placement EXAM: PORTABLE CHEST 1 VIEW  COMPARISON:  July 28, 2019 study obtained earlier in the day FINDINGS: Central catheter tip is at the cavoatrial junction. Nasogastric tube tip and side port are in the stomach. No pneumothorax. There is airspace consolidation in the lung bases, more severe on the right than on the left, stable. No new opacity evident. Heart size and pulmonary vascularity are normal. No adenopathy. There is aortic atherosclerosis. No bone lesions. IMPRESSION: Tube and catheter positions as described without pneumothorax. Bibasilar consolidation, more severe on the right than the left, stable. Cardiac silhouette within  normal limits. Aortic Atherosclerosis (ICD10-I70.0). Electronically Signed   By: Lowella Grip III M.D.   On: 07/28/2019 19:20   DG CHEST PORT 1 VIEW  Result Date: 07/28/2019 CLINICAL DATA:  Central venous catheter placement EXAM: PORTABLE CHEST 1 VIEW COMPARISON:  07/28/2019 FINDINGS: Single frontal view of the chest demonstrates left-sided PICC tip overlying superior vena cava. Enteric catheter passes below diaphragm side port projecting over gastric fundus. Tip is excluded by collimation. Cardiac silhouette is stable. There is persistent central vascular congestion with bibasilar consolidation, right greater than left. No effusion or pneumothorax. IMPRESSION: 1. Support devices as above. 2. Persistent central vascular congestion and bibasilar consolidation, stable. Electronically Signed   By: Randa Ngo M.D.   On: 07/28/2019 16:54    Anti-infectives: Anti-infectives (From admission, onward)   Start     Dose/Rate Route Frequency Ordered Stop   07/28/19 1800  vancomycin (VANCOCIN) IVPB 1000 mg/200 mL premix  Status:  Discontinued     1,000 mg 200 mL/hr over 60 Minutes Intravenous Every 12 hours 07/28/19 0621 07/30/19 0807   07/28/19 0445  vancomycin (VANCOREADY) IVPB 2000 mg/400 mL     2,000 mg 200 mL/hr over 120 Minutes Intravenous  Once 07/28/19 0439 07/28/19 0824   07/28/19 0445  piperacillin-tazobactam (ZOSYN) IVPB 3.375 g     3.375 g 12.5 mL/hr over 240 Minutes Intravenous Every 8 hours 07/28/19 0439     07/25/19 0915  ceFAZolin (ANCEF) IVPB 2g/100 mL premix     2 g 200 mL/hr over 30 Minutes Intravenous On call 07/25/19 0908 07/25/19 1307       Assessment/Plan COPD/100+ pack year history -currently @ 1/2 PPD Rheumatoid arthritis Atrial fibrillation - controlled on cardizem gtt, hep gtt Dehydration/acute renal failure - Cr 1.41 from 1.14 PMH RA on methotrexate   Small bowel obstruction with history of colon resection in 1980s S/p ex lap, enterolysis, decompressive  enterotomy and SB anastomosis 6/4 Dr. Hassell Done - POD#5, afebrile, WBC 8 on 6/8 - NGT replaced yesterday, 2,075 cc/24h feculent - having some flatus, continue NG tube LIWS and await further ROBF - OOB, PT/OT - IS 10x q 1h, flutter valve  - dry-to-dry dressing changes BID to abd wound and monitor   FEN: N.p.o./IV fluids, continue TNA ID: Zosyn 6/7 , started by primary team for potential HCAP VTE: heparin gtt, plan to transition to coumadin once taking PO GU: condom cath Follow-up: Dr. Hassell Done    LOS: 8 days     Jill Alexanders , [facility] 07/30/2019, 9:19 AM Please see Amion for pager number during day hours 7:00am-4:30pm

## 2019-07-30 NOTE — Progress Notes (Addendum)
Progress Note  Patient Name: Jake [name] Date of Encounter: 07/30/2019  Primary Cardiologist:  No primary care provider on file.  Dr Claudie Leach from [facility] (assigned to him, he has seen [name], [facility])  Subjective   Still having large output from NGT. Denies any chest pain or SOB.  Remains in atrial fibrillation with HR in the low 100's  Inpatient Medications    Scheduled Meds: . budesonide (PULMICORT) nebulizer solution  0.5 mg Nebulization BID  . chlorhexidine  15 mL Mouth Rinse BID  . Chlorhexidine Gluconate Cloth  6 each Topical Daily  . enoxaparin (LOVENOX) injection  1 mg/kg Subcutaneous Q12H  . insulin aspart  0-15 Units Subcutaneous Q6H  . ipratropium  0.5 mg Nebulization BID  . levalbuterol  0.63 mg Nebulization BID  . mouth rinse  15 mL Mouth Rinse BID   Continuous Infusions: . diltiazem (CARDIZEM) infusion 15 mg/hr (07/30/19 0417)  . magnesium sulfate bolus IVPB 2 g (07/30/19 0946)  . piperacillin-tazobactam (ZOSYN)  IV Stopped (07/30/19 0927)  . potassium chloride 10 mEq (07/30/19 0948)  . TPN ADULT (ION) 75 mL/hr at 07/30/19 0405   PRN Meds: guaiFENesin-dextromethorphan, HYDROmorphone (DILAUDID) injection, labetalol, levalbuterol, methocarbamol, ondansetron **OR** ondansetron (ZOFRAN) IV, polyethylene glycol, senna-docusate   Vital Signs    Vitals:   07/30/19 0356 07/30/19 0400 07/30/19 0751 07/30/19 0800  BP:  108/62    Pulse:  (!) 104    Resp:  16    Temp: 98.6 F (37 C)   (!) 97.2 F (36.2 C)  TempSrc: Oral   Axillary  SpO2:  94% 98%   Weight:      Height:        Intake/Output Summary (Last 24 hours) at 07/30/2019 1000 Last data filed at 07/30/2019 0700 Gross per 24 hour  Intake 2930.51 ml  Output 4725 ml  Net -1794.49 ml   Filed Weights   07/22/19 1812 07/22/19 2330  Weight: 95.3 kg 92.2 kg   Last Weight  Most recent update: 07/22/2019 11:56 PM   Weight  92.2 kg (203 lb 4.2 oz)           Weight change:    Telemetry  Atrial  fibrillation with HR in the low 100's- Personally Reviewed  ECG    No new EKG to review- Personally Reviewed  Physical Exam   GEN: Well nourished, well developed in no acute distress HEENT: Normal NECK: No JVD; No carotid bruits LYMPHATICS: No lymphadenopathy CARDIAC:irregularly irregular, no murmurs, rubs, gallops RESPIRATORY:  Clear to auscultation without rales, wheezing or rhonchi  ABDOMEN: Soft, non-tender, non-distended MUSCULOSKELETAL:  No edema; No deformity  SKIN: Warm and dry NEUROLOGIC:  Alert and oriented x 3 PSYCHIATRIC:  Normal affect    Labs    Hematology Recent Labs  Lab 07/27/19 0307 07/28/19 0310 07/29/19 0304  WBC 8.2 9.9 8.4  RBC 3.20* 3.27* 3.32*  HGB 11.3* 11.6* 11.7*  HCT 34.3* 34.9* 35.1*  MCV 107.2* 106.7* 105.7*  MCH 35.3* 35.5* 35.2*  MCHC 32.9 33.2 33.3  RDW 13.8 13.9 13.7  PLT 151 157 155    Chemistry Recent Labs  Lab 07/29/19 0304 07/29/19 1600 07/30/19 0220  NA 134* 138 140  K 3.2* 3.0* 3.4*  CL 88* 87* 84*  CO2 31 40* 40*  GLUCOSE 133* 147* 117*  BUN 42* 38* 40*  CREATININE 1.11 0.91 1.04  CALCIUM 8.0* 8.2* 8.4*  PROT  --   --  5.9*  ALBUMIN  --   --  2.7*  AST  --   --  22  ALT  --   --  15  ALKPHOS  --   --  43  BILITOT  --   --  1.7*  GFRNONAA >60 >60 >60  GFRAA >60 >60 >60  ANIONGAP 15 11 16*     High Sensitivity Troponin:   Recent Labs  Lab 07/23/19 0300  TROPONINIHS 24*      BNP Recent Labs  Lab 07/27/19 0751 07/29/19 0308  BNP 269.2* 378.9*     DDimer No results for input(s): DDIMER in the last 168 hours.   Radiology    DG Abd 1 View  Result Date: 07/29/2019 CLINICAL DATA:  NG tube placement EXAM: ABDOMEN - 1 VIEW COMPARISON:  07/25/2019 FINDINGS: NG tube remains in stable position with the tip in the proximal stomach and the side port in the distal esophagus. Multiple dilated loops of small bowel again noted, unchanged. IMPRESSION: NG tube in stable position with the tip in the proximal  stomach. Electronically Signed   By: Rolm Baptise M.D.   On: 07/29/2019 11:57   DG Chest Port 1 View  Result Date: 07/29/2019 CLINICAL DATA:  Shortness of breath EXAM: PORTABLE CHEST 1 VIEW COMPARISON:  07/28/2019 FINDINGS: Cardiac shadow is stable. Left-sided PICC line is again noted at the cavoatrial junction. Gastric catheter has been removed in the interval. Improved aeration is noted in the bases bilaterally. No new focal confluent infiltrate is seen. No bony abnormality is noted. IMPRESSION: Improved aeration in the bases.  No acute abnormality noted. Electronically Signed   By: Inez Catalina M.D.   On: 07/29/2019 08:23   DG CHEST PORT 1 VIEW  Result Date: 07/28/2019 CLINICAL DATA:  Central venous catheter placement EXAM: PORTABLE CHEST 1 VIEW COMPARISON:  July 28, 2019 study obtained earlier in the day FINDINGS: Central catheter tip is at the cavoatrial junction. Nasogastric tube tip and side port are in the stomach. No pneumothorax. There is airspace consolidation in the lung bases, more severe on the right than on the left, stable. No new opacity evident. Heart size and pulmonary vascularity are normal. No adenopathy. There is aortic atherosclerosis. No bone lesions. IMPRESSION: Tube and catheter positions as described without pneumothorax. Bibasilar consolidation, more severe on the right than the left, stable. Cardiac silhouette within normal limits. Aortic Atherosclerosis (ICD10-I70.0). Electronically Signed   By: Lowella Grip III M.D.   On: 07/28/2019 19:20   DG CHEST PORT 1 VIEW  Result Date: 07/28/2019 CLINICAL DATA:  Central venous catheter placement EXAM: PORTABLE CHEST 1 VIEW COMPARISON:  07/28/2019 FINDINGS: Single frontal view of the chest demonstrates left-sided PICC tip overlying superior vena cava. Enteric catheter passes below diaphragm side port projecting over gastric fundus. Tip is excluded by collimation. Cardiac silhouette is stable. There is persistent central vascular  congestion with bibasilar consolidation, right greater than left. No effusion or pneumothorax. IMPRESSION: 1. Support devices as above. 2. Persistent central vascular congestion and bibasilar consolidation, stable. Electronically Signed   By: Randa Ngo M.D.   On: 07/28/2019 16:54   DG CHEST PORT 1 VIEW  Result Date: 07/28/2019 CLINICAL DATA:  Shortness of breath, cough EXAM: PORTABLE CHEST 1 VIEW COMPARISON:  Radiograph 07/27/2018 FINDINGS: Transesophageal tube tip distal to the GE junction, terminating below the level of imaging. Telemetry leads overlie the chest. Lung volumes are low with some streaky basilar opacities favoring atelectasis. Increasing opacity in the right lung base could reflect worsening atelectasis or developing airspace disease.  Central vascular crowding is noted. No convincing features of edema. The aorta is calcified. The remaining cardiomediastinal contours are unremarkable. No acute osseous or soft tissue abnormality. Degenerative changes are present in the imaged spine and shoulders. IMPRESSION: Transesophageal tube tip below the GE junction. Low lung volumes with some streaky basilar opacities favoring atelectasis. More patchy increased opacity in the right lung base could reflect further volume loss or developing airspace disease. Electronically Signed   By: Lovena Le M.D.   On: 07/28/2019 04:08   DG Chest Port 1 View  Result Date: 07/27/2019 CLINICAL DATA:  Dyspnea, post exploratory laparotomy, small-bowel resection, and enterolysis; past history hypertension, COPD, atrial fibrillation EXAM: PORTABLE CHEST 1 VIEW COMPARISON:  Portable exam 0758 hours compared to 05/29/2019 FINDINGS: Nasogastric tube extends into abdomen. Upper normal heart size. Mediastinal contours and pulmonary vascularity normal. EKG leads project over chest. Decreased lung volumes with bibasilar atelectasis. No definite infiltrate, pleural effusion or pneumothorax. IMPRESSION: Decreased lung volumes  with bibasilar atelectasis. Electronically Signed   By: Lavonia Dana M.D.   On: 07/27/2019 10:11   [place] EKG SITE RITE  Result Date: 07/28/2019 If Site Rite image not attached, placement could not be confirmed due to current cardiac rhythm.    Cardiac Studies   ECHO:  07/22/2019 1. Left ventricular ejection fraction, by estimation, is 55 to 60%. The  left ventricle has normal function. The left ventricle has no regional  wall motion abnormalities. Left ventricular diastolic parameters are  indeterminate.  2. Right ventricular systolic function is normal. The right ventricular  size is mildly enlarged. There is mildly elevated pulmonary artery  systolic pressure. The estimated right ventricular systolic pressure is  23.3 mmHg.  3. The mitral valve is normal in structure. Trivial mitral valve  regurgitation. No evidence of mitral stenosis.  4. The aortic valve is tricuspid. Aortic valve regurgitation is not  visualized. No aortic stenosis is present.  5. The inferior vena cava is normal in size with greater than 50%  respiratory variability, suggesting right atrial pressure of 3 mmHg.  6. The patient was in atrial fibrillation.   Patient Profile     74 y.o. 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  Assessment & Plan    1. SBO - still complains of abdominal pain - s/p lab chole with enterolysis and decompressive enterotomy with SB anastamosis - still having large volumes of NG outpt - awaiting return of bowel function - surgery following  2. Atrial fib, RVR -HR improved after placing back on Cardizem gtt>>? Poor absorption of PO -HR in the 80-100's -continue IV Cardizem for now until return of bowel function -avoid BB in setting of active wheezing -if cannot get HR under control may need to consider IV  Amio -Mr. Broom could not afford Xarelto and he does not remember discussing Coumadin with his PCP. -he was taking baby aspirin a few times a week. -Currently on IV Heparin gtt and recommend changing to Coumadin once ok by surgery- CHA2DS2-VASc is 3 (age x 2, HTN)  3. COPD/exacerbation/Acute respiratory failure with hypoxia - continue to have wheezing  -concern for HCAP and likely CHF - got lasix yesterday and put out 2L UOP yesterday and is net neg 1.2L - getting nebs and antibx - per TRH  4.  HTN -  BP controlled at 108/10mmHg -continue IV Cardizem gtt for now -he was on Lisinopril and HCTZ at home currently on hold  5.  Acute diastolic CHF -BNP mildly elevated although Cxray with no edema -likely related to afib with RVR recently -he has put out 2L UOP yesterday and is net -1.2L -fluids have been stopped -creatinine stable at 1.04 -BNP elevated at 378 yesterday which is trending upward.  Cxray with no edema -dosing Lasix on PRN daily basis  I have spent a total of 35 minutes with patient reviewing hospital notes , telemetry, EKGs, labs and examining patient as well as establishing an assessment and plan that was discussed with the patient.  > 50% of time was spent in direct patient care.     Signed, Fransico Him, MD 10:00 AM 07/30/2019

## 2019-07-30 NOTE — Progress Notes (Signed)
PROGRESS NOTE    Jake [name]  [name] DOB: [birth year] DOA: 07/22/2019 PCP: Jake Axe, MD   Brief Narrative:  74 y.o.malewithhistory of hypertension rheumatoid arthritis, AAA without rupture, hepatitis C COPD presented to the ER with complaints of abdominal pain and nausea vomiting. In the ER CT abdomen pelvis shows small bowel obstruction and on-call general surgeon Dr. Harlow Asa was consulted who requested NG tube placement and will be seeing patient in consult.  Initially failed medical management therefore surgical intervention was required of 6/4.  Hospital course also complicated by atrial fibrillation with RVR, cardiology consulted.    Assessment & Plan:   Principal Problem:   SBO (small bowel obstruction) (HCC) Active Problems:   A-fib (HCC)   Atrial fibrillation with rapid ventricular response (HCC)   Acute diastolic CHF (congestive heart failure) ([place])   Essential hypertension  Acute hypoxic respiratory failure/COPD with Hypoxia, down to room air Discontinue vancomycin.  Currently on Zosyn.  Last day 6/11.  Supportive care. Condom catheter in place No further need for diuretics for now. Supplemental oxygen as needed, bronchodilators Xopenex. Ipratropium; IS/Flutter Supplemental oxygen as needed As needed Lasix as needed going forward  Small bowel obstruction; mechanical Status post ex lap/enterolysis, decompressive enterotomy 6/4 Failed medical management therefore require surgical intervention on 6/4 Diet per Gen Surgery Dressing per surgery  Atrial fibrillation with RVR, new onset, rate improved Echocardiogram shows EF of 55-60% Currently on IV Cardizem.  Cardiology following. TPN via PICC line.  Lovenox 1 mg/kg every 12 hours.  Eventually p.o. Coumadin Replete mag potassium as needed  Essential hypertension Home HCTZ and lisinopril on hold Continue Cardizem  History of rheumatoid arthritis on methotrexate presently n.p.o.  Mild renal  insufficiency, baseline creatinine 1.1 -Creatinine peaked at 1.4.  Today 1.1    DVT prophylaxis: Currently on full dose Lovenox Code Status: Full code Family Communication: None at bedside Status is: Inpatient  Remains inpatient appropriate because:IV treatments appropriate due to intensity of illness or inability to take PO   Dispo: The patient is from: Home              Anticipated d/c is to: Home              Anticipated d/c date is: 3 days              Patient currently is not medically stable to d/c.  Unsafe for discharge due to multiple ongoing issues-awaiting bowel function to return.  In the meantime he is on multiple IV treatments  Subjective: Overall feels slightly better in terms of abdominal pain and shortness of breath this morning.  Review of Systems Otherwise negative except as per HPI, including: General: Denies fever, chills, night sweats or unintended weight loss. Resp: Denies cough, wheezing, shortness of breath. Cardiac: Denies chest pain, palpitations, orthopnea, paroxysmal nocturnal dyspnea. GI: Denies abdominal pain, nausea, vomiting, diarrhea or constipation GU: Denies dysuria, frequency, hesitancy or incontinence MS: Denies muscle aches, joint pain or swelling Neuro: Denies headache, neurologic deficits (focal weakness, numbness, tingling), abnormal gait Psych: Denies anxiety, depression, SI/HI/AVH Skin: Denies new rashes or lesions ID: Denies sick contacts, exotic exposures, travel  Examination: Constitutional: Not in acute distress Respiratory: Bibasilar rhonchi Cardiovascular: Irregularly irregular Abdomen: Nontender nondistended good bowel sounds Musculoskeletal: No edema noted Skin: No rashes seen Neurologic: CN 2-12 grossly intact.  And nonfocal Psychiatric: Normal judgment and insight. Alert and oriented x 3. Normal mood.   PICC line noted-left upper extremity External Foley catheter Surgical scar on the abdomen  noted Objective: Vitals:   07/30/19 0356 07/30/19 0400 07/30/19 0751 07/30/19 0800  BP:  108/62  (!) 116/49  Pulse:  (!) 104  87  Resp:  16  (!) 21  Temp: 98.6 F (37 C)   (!) 97.2 F (36.2 C)  TempSrc: Oral   Axillary  SpO2:  94% 98% 100%  Weight:      Height:        Intake/Output Summary (Last 24 hours) at 07/30/2019 1141 Last data filed at 07/30/2019 1137 Gross per 24 hour  Intake 3890.09 ml  Output 4725 ml  Net -834.91 ml   Filed Weights   07/22/19 1812 07/22/19 2330  Weight: 95.3 kg 92.2 kg     Data Reviewed:   CBC: Recent Labs  Lab 07/25/19 0200 07/26/19 0542 07/27/19 0307 07/28/19 0310 07/29/19 0304  WBC 10.1 7.8 8.2 9.9 8.4  NEUTROABS  --   --   --   --  7.1  HGB 12.7* 11.8* 11.3* 11.6* 11.7*  HCT 38.7* 34.6* 34.3* 34.9* 35.1*  MCV 106.9* 106.1* 107.2* 106.7* 105.7*  PLT 170 142* 151 157 604   Basic Metabolic Panel: Recent Labs  Lab 07/25/19 0204 07/26/19 0542 07/27/19 0307 07/28/19 0310 07/29/19 0304 07/29/19 1600 07/30/19 0220  NA 139   < > 137 135 134* 138 140  K 3.4*   < > 3.7 3.5 3.2* 3.0* 3.4*  CL 91*   < > 94* 91* 88* 87* 84*  CO2 35*   < > 32 32 31 40* 40*  GLUCOSE 131*   < > 94 93 133* 147* 117*  BUN 38*   < > 37* 42* 42* 38* 40*  CREATININE 1.41*   < > 0.94 1.12 1.11 0.91 1.04  CALCIUM 8.8*   < > 8.6* 8.4* 8.0* 8.2* 8.4*  MG 1.7  --  2.0 2.0 1.7  --  2.1  PHOS  --   --   --   --  3.1  --   --    < > = values in this interval not displayed.   GFR: Estimated Creatinine Clearance: 69.4 mL/min (by C-G formula based on SCr of 1.04 mg/dL). Liver Function Tests: Recent Labs  Lab 07/30/19 0220  AST 22  ALT 15  ALKPHOS 43  BILITOT 1.7*  PROT 5.9*  ALBUMIN 2.7*   No results for input(s): LIPASE, AMYLASE in the last 168 hours. No results for input(s): AMMONIA in the last 168 hours. Coagulation Profile: Recent Labs  Lab 07/24/19 0036  INR 1.1   Cardiac Enzymes: No results for input(s): CKTOTAL, CKMB, CKMBINDEX, TROPONINI in  the last 168 hours. BNP (last 3 results) No results for input(s): PROBNP in the last 8760 hours. HbA1C: Recent Labs    07/28/19 1018  HGBA1C 5.2   CBG: Recent Labs  Lab 07/29/19 1114 07/29/19 1716 07/29/19 1949 07/29/19 2313 07/30/19 0719  GLUCAP 171* 143* 153* 159* 181*   Lipid Profile: Recent Labs    07/29/19 0304  TRIG 107   Thyroid Function Tests: No results for input(s): TSH, T4TOTAL, FREET4, T3FREE, THYROIDAB in the last 72 hours. Anemia Panel: No results for input(s): VITAMINB12, FOLATE, FERRITIN, TIBC, IRON, RETICCTPCT in the last 72 hours. Sepsis Labs: Recent Labs  Lab 07/28/19 0513  PROCALCITON 0.85    Recent Results (from the past 240 hour(s))  SARS Coronavirus 2 by RT PCR (hospital order, performed in [facility] hospital lab) Nasopharyngeal Nasopharyngeal Swab     Status: None   Collection  Time: 07/22/19  6:54 PM   Specimen: Nasopharyngeal Swab  Result Value Ref Range Status   SARS Coronavirus 2 NEGATIVE NEGATIVE Final    Comment: (NOTE) SARS-CoV-2 target nucleic acids are NOT DETECTED. The SARS-CoV-2 RNA is generally detectable in upper and lower respiratory specimens during the acute phase of infection. The lowest concentration of SARS-CoV-2 viral copies this assay can detect is 250 copies / mL. A negative result does not preclude SARS-CoV-2 infection and should not be used as the sole basis for treatment or other patient management decisions.  A negative result may occur with improper specimen collection / handling, submission of specimen other than nasopharyngeal swab, presence of viral mutation(s) within the areas targeted by this assay, and inadequate number of viral copies (<250 copies / mL). A negative result must be combined with clinical observations, patient history, and epidemiological information. Fact Sheet for Patients:   StrictlyIdeas.no Fact Sheet for Healthcare  Providers: BankingDealers.co.za This test is not yet approved or cleared  by the [place] FDA and has been authorized for detection and/or diagnosis of SARS-CoV-2 by FDA under an Emergency Use Authorization (EUA).  This EUA will remain in effect (meaning this test can be used) for the duration of the COVID-19 declaration under Section 564(b)(1) of the Act, 21 U.S.C. section 360bbb-3(b)(1), unless the authorization is terminated or revoked sooner. Performed at [facility], [address] [place], [place] [address]   MRSA PCR Screening     Status: None   Collection Time: 07/22/19 11:25 PM   Specimen: Nasal Mucosa; Nasopharyngeal  Result Value Ref Range Status   MRSA by PCR NEGATIVE NEGATIVE Final    Comment:        The GeneXpert MRSA Assay (FDA approved for NASAL specimens only), is one component of a comprehensive MRSA colonization surveillance program. It is not intended to diagnose MRSA infection nor to guide or monitor treatment for MRSA infections. Performed at [facility], [place] [address] [place], [place] [address]          Radiology Studies: DG Abd 1 View  Result Date: 07/29/2019 CLINICAL DATA:  NG tube placement EXAM: ABDOMEN - 1 VIEW COMPARISON:  07/25/2019 FINDINGS: NG tube remains in stable position with the tip in the proximal stomach and the side port in the distal esophagus. Multiple dilated loops of small bowel again noted, unchanged. IMPRESSION: NG tube in stable position with the tip in the proximal stomach. Electronically Signed   By: Rolm Baptise M.D.   On: 07/29/2019 11:57   DG Chest Port 1 View  Result Date: 07/29/2019 CLINICAL DATA:  Shortness of breath EXAM: PORTABLE CHEST 1 VIEW COMPARISON:  07/28/2019 FINDINGS: Cardiac shadow is stable. Left-sided PICC line is again noted at the cavoatrial junction. Gastric catheter has been removed in the interval. Improved aeration is noted in the bases  bilaterally. No new focal confluent infiltrate is seen. No bony abnormality is noted. IMPRESSION: Improved aeration in the bases.  No acute abnormality noted. Electronically Signed   By: Inez Catalina M.D.   On: 07/29/2019 08:23   DG CHEST PORT 1 VIEW  Result Date: 07/28/2019 CLINICAL DATA:  Central venous catheter placement EXAM: PORTABLE CHEST 1 VIEW COMPARISON:  July 28, 2019 study obtained earlier in the day FINDINGS: Central catheter tip is at the cavoatrial junction. Nasogastric tube tip and side port are in the stomach. No pneumothorax. There is airspace consolidation in the lung bases, more severe on the right than on  the left, stable. No new opacity evident. Heart size and pulmonary vascularity are normal. No adenopathy. There is aortic atherosclerosis. No bone lesions. IMPRESSION: Tube and catheter positions as described without pneumothorax. Bibasilar consolidation, more severe on the right than the left, stable. Cardiac silhouette within normal limits. Aortic Atherosclerosis (ICD10-I70.0). Electronically Signed   By: Lowella Grip III M.D.   On: 07/28/2019 19:20   DG CHEST PORT 1 VIEW  Result Date: 07/28/2019 CLINICAL DATA:  Central venous catheter placement EXAM: PORTABLE CHEST 1 VIEW COMPARISON:  07/28/2019 FINDINGS: Single frontal view of the chest demonstrates left-sided PICC tip overlying superior vena cava. Enteric catheter passes below diaphragm side port projecting over gastric fundus. Tip is excluded by collimation. Cardiac silhouette is stable. There is persistent central vascular congestion with bibasilar consolidation, right greater than left. No effusion or pneumothorax. IMPRESSION: 1. Support devices as above. 2. Persistent central vascular congestion and bibasilar consolidation, stable. Electronically Signed   By: Randa Ngo M.D.   On: 07/28/2019 16:54        Scheduled Meds: . budesonide (PULMICORT) nebulizer solution  0.5 mg Nebulization BID  . chlorhexidine  15 mL  Mouth Rinse BID  . Chlorhexidine Gluconate Cloth  6 each Topical Daily  . enoxaparin (LOVENOX) injection  1 mg/kg Subcutaneous Q12H  . insulin aspart  0-20 Units Subcutaneous Q4H  . ipratropium  0.5 mg Nebulization BID  . levalbuterol  0.63 mg Nebulization BID  . mouth rinse  15 mL Mouth Rinse BID   Continuous Infusions: . diltiazem (CARDIZEM) infusion 15 mg/hr (07/30/19 1137)  . piperacillin-tazobactam (ZOSYN)  IV Stopped (07/30/19 0927)  . potassium chloride 100 mL/hr at 07/30/19 1137  . TPN ADULT (ION) 75 mL/hr at 07/30/19 1137  . TPN ADULT (ION)       LOS: 8 days   Time spent= 35 mins    Alok Minshall Arsenio Loader, MD [facility]  If 7PM-7AM, please contact night-coverage  07/30/2019, 11:41 AM

## 2019-07-30 NOTE — Progress Notes (Signed)
PHARMACY - TOTAL PARENTERAL NUTRITION CONSULT NOTE   Indication: bowel obstruction   Patient Measurements: Height: 6\' 1"  (185.4 cm) Weight: 92.2 kg (203 lb 4.2 oz) IBW/kg (Calculated) : 79.9 TPN AdjBW (KG): 92.2 Body mass index is 26.82 kg/m. Usual Weight: 100 kg in 12/2018  Assessment: 74 yo male presented on 07/22/2019 with abdominal pain, nausea, vomiting found to have SBO. Patient reports appetite "came and went" prior to admission and reports last food consumed 2 days prior to admission. Patient has been NPO since admission and underwent exploratory lap with enterolysis, decompressive enterotomy, and small bowel anastomosis on 6/4.   Glucose / Insulin: No hx of DM. A1c 5.2. CBGs above goal at 150-180. 9 units/24 hours. On mSSI q6h. Electrolytes: K 3.4 - patient to receive total 70 mEq KCl IV today and furosemide 40mg  x1. Mg 2.1 - receiving 2g x1. Cl 84 - decrease. Corrected Ca 9.44. Other electrolytes wnl.  Renal: Scr 1.04 - stable. 1533ml output with diuresis yesterday.  LFTs / TGs: AST/ALT wnl. Tbili 1.7 - improved. TG 107 (6/8).  Prealbumin / albumin: Prealbumin 6.0 (6/8). Albumin 2.7.  Intake / Output; MIVF: 2400 ml/24 hrs NG output.  GI Imaging: 6/1 CT: mid to distal SBO 6/2 x-ray: persistent SBO 6/3 x-ray: interval improvement in dilated small bowel loops Surgeries / Procedures:  6/4 ex lap, enterolysis, decompressive enterotomy and SB anastomosis   Central access: PICC placed 6/7 TPN start date: 6/7  Nutritional Goals (RD recommendation on 6/8 per verbal conversation with Jarome Matin): kCal: 2100-2300, Protein: 105-120g, Fluid: >2L Goal TPN rate is 75 mL/hr (provides 117g of protein and 2138 kcals per day)  Current Nutrition:  NPO TPN started on 6/7   Plan:  Continue TPN rate at goal rate of 75 mL/hr at 1800 Continue to use Clinisol to decrease total volume administered and monitor fluid status/O2 requirements - likely could change to Travasol tomorrow   Adjusted electrolytes in TPN: K increased to 40 mEq/L Continued electrolytes in TPN: Na 90 mEq/L, Mg 10 mEq/L, Cl:Ac ratio max chloride Electrolytes in TPN: Na increased to 90 mEq/L, K 30 mEq/L, Mg increased to 10 mEq/L, Ca 5 mEq/L, Phos 15 mmol/L, Cl:Ac ratio changed max chloride Add standard MVI and trace elements to TPN Adjust from  moderate q6h SSI to resistant q4h SSI and adjust as needed  Monitor TPN labs on Mon/Thurs, recheck electrolytes tomorrow   [name], PharmD PGY1 Pharmacy Resident [employer]: [phone]  07/30/2019,10:23 AM

## 2019-07-31 LAB — BASIC METABOLIC PANEL
Anion gap: 13 (ref 5–15)
BUN: 39 mg/dL — ABNORMAL HIGH (ref 8–23)
CO2: 32 mmol/L (ref 22–32)
Calcium: 8.1 mg/dL — ABNORMAL LOW (ref 8.9–10.3)
Chloride: 92 mmol/L — ABNORMAL LOW (ref 98–111)
Creatinine, Ser: 0.92 mg/dL (ref 0.61–1.24)
GFR calc Af Amer: >60 mL/min (ref >60)
GFR calc non Af Amer: >60 mL/min (ref >60)
Glucose, Bld: 158 mg/dL — ABNORMAL HIGH (ref 70–99)
Potassium: 3 mmol/L — ABNORMAL LOW (ref 3.5–5.1)
Sodium: 137 mmol/L (ref 135–145)

## 2019-07-31 LAB — GLUCOSE, CAPILLARY
Glucose-Capillary: 141 mg/dL — ABNORMAL HIGH (ref 70–99)
Glucose-Capillary: 142 mg/dL — ABNORMAL HIGH (ref 70–99)
Glucose-Capillary: 142 mg/dL — ABNORMAL HIGH (ref 70–99)
Glucose-Capillary: 143 mg/dL — ABNORMAL HIGH (ref 70–99)
Glucose-Capillary: 149 mg/dL — ABNORMAL HIGH (ref 70–99)
Glucose-Capillary: 151 mg/dL — ABNORMAL HIGH (ref 70–99)
Glucose-Capillary: 153 mg/dL — ABNORMAL HIGH (ref 70–99)

## 2019-07-31 LAB — COMPREHENSIVE METABOLIC PANEL
ALT: 30 U/L (ref 0–44)
AST: 45 U/L — ABNORMAL HIGH (ref 15–41)
Albumin: 3 g/dL — ABNORMAL LOW (ref 3.5–5.0)
Alkaline Phosphatase: 70 U/L (ref 38–126)
Anion gap: 15 (ref 5–15)
BUN: 39 mg/dL — ABNORMAL HIGH (ref 8–23)
CO2: 36 mmol/L — ABNORMAL HIGH (ref 22–32)
Calcium: 9.3 mg/dL (ref 8.9–10.3)
Chloride: 89 mmol/L — ABNORMAL LOW (ref 98–111)
Creatinine, Ser: 0.93 mg/dL (ref 0.61–1.24)
GFR calc Af Amer: >60 mL/min (ref >60)
GFR calc non Af Amer: >60 mL/min (ref >60)
Glucose, Bld: 129 mg/dL — ABNORMAL HIGH (ref 70–99)
Potassium: 3.7 mmol/L (ref 3.5–5.1)
Sodium: 140 mmol/L (ref 135–145)
Total Bilirubin: 1.6 mg/dL — ABNORMAL HIGH (ref 0.3–1.2)
Total Protein: 6.7 g/dL (ref 6.5–8.1)

## 2019-07-31 LAB — TROPONIN I (HIGH SENSITIVITY): Troponin I (High Sensitivity): 27 ng/L — ABNORMAL HIGH (ref <18)

## 2019-07-31 LAB — MAGNESIUM
Magnesium: 2.4 mg/dL (ref 1.7–2.4)
Magnesium: 2.1 mg/dL (ref 1.7–2.4)

## 2019-07-31 LAB — PHOSPHORUS: Phosphorus: 3.4 mg/dL (ref 2.5–4.6)

## 2019-07-31 MED ORDER — TRAVASOL 10 % IV SOLN: INTRAVENOUS | Status: DC

## 2019-07-31 MED ORDER — TRAVASOL 10 % IV SOLN
INTRAVENOUS | Status: AC
  Filled 2019-07-31: qty 1144.8

## 2019-07-31 NOTE — Progress Notes (Signed)
[facility] Progress Note  6 Days Post-Op  Subjective: NG tube accidentally pulled out overnight by patient. Reports a large, non-bloody BM yesterday and one this AM. +flatus x3 today. Denies nausea. Respiratory therapy at bedside for breathing treatment.  Objective: Vital signs in last 24 hours: Temp:  [97.5 F (36.4 C)-98.7 F (37.1 C)] 98.4 F (36.9 C) (06/10 0800) Pulse Rate:  [59-183] 99 (06/10 0500) Resp:  [19-30] 21 (06/10 0821) BP: (106-132)/(60-78) 118/78 (06/10 0821) SpO2:  [91 %-100 %] 92 % (06/10 0847) Last BM Date: 07/31/19  Intake/Output from previous day: 06/09 0701 - 06/10 0700 In: 1943.5 [P.O.:120; I.V.:1223.5; IV Piggyback:600] Out: 2750 [Urine:1800; Emesis/NG output:950] Intake/Output this shift: Total I/O In: 1361.8 [I.V.:1278.2; IV Piggyback:83.6] Out: -   PE: General: pleasant, WD, chronically ill appearing white male laying in bed in NAD Heart: irregularly irregular 120bpm during my exam, DP 2+ BL Lungs: CTAB Abd: soft, less distention, non-tender, dressing c/d/i GU: condom cath in place with clear, yellow urine   Lab Results:  Recent Labs    07/29/19 0304  WBC 8.4  HGB 11.7*  HCT 35.1*  PLT 155   BMET Recent Labs    07/30/19 0220 07/31/19 0500  NA 140 140  K 3.4* 3.7  CL 84* 89*  CO2 40* 36*  GLUCOSE 117* 129*  BUN 40* 39*  CREATININE 1.04 0.93  CALCIUM 8.4* 9.3   PT/INR No results for input(s): LABPROT, INR in the last 72 hours. CMP     Component Value Date/Time   NA 140 07/31/2019 0500   K 3.7 07/31/2019 0500   CL 89 (L) 07/31/2019 0500   CO2 36 (H) 07/31/2019 0500   GLUCOSE 129 (H) 07/31/2019 0500   BUN 39 (H) 07/31/2019 0500   CREATININE 0.93 07/31/2019 0500   CALCIUM 9.3 07/31/2019 0500   PROT 6.7 07/31/2019 0500   ALBUMIN 3.0 (L) 07/31/2019 0500   AST 45 (H) 07/31/2019 0500   ALT 30 07/31/2019 0500   ALKPHOS 70 07/31/2019 0500   BILITOT 1.6 (H) 07/31/2019 0500   GFRNONAA >60 07/31/2019 0500    GFRAA >60 07/31/2019 0500   Lipase     Component Value Date/Time   LIPASE 19 07/22/2019 1854       Studies/Results: DG Abd 1 View  Result Date: 07/29/2019 CLINICAL DATA:  NG tube placement EXAM: ABDOMEN - 1 VIEW COMPARISON:  07/25/2019 FINDINGS: NG tube remains in stable position with the tip in the proximal stomach and the side port in the distal esophagus. Multiple dilated loops of small bowel again noted, unchanged. IMPRESSION: NG tube in stable position with the tip in the proximal stomach. Electronically Signed   By: Rolm Baptise M.D.   On: 07/29/2019 11:57    Anti-infectives: Anti-infectives (From admission, onward)   Start     Dose/Rate Route Frequency Ordered Stop   07/28/19 1800  vancomycin (VANCOCIN) IVPB 1000 mg/200 mL premix  Status:  Discontinued        1,000 mg 200 mL/hr over 60 Minutes Intravenous Every 12 hours 07/28/19 0621 07/30/19 0807   07/28/19 0445  vancomycin (VANCOREADY) IVPB 2000 mg/400 mL        2,000 mg 200 mL/hr over 120 Minutes Intravenous  Once 07/28/19 0439 07/28/19 0824   07/28/19 0445  piperacillin-tazobactam (ZOSYN) IVPB 3.375 g     Discontinue     3.375 g 12.5 mL/hr over 240 Minutes Intravenous Every 8 hours 07/28/19 0439 08/01/19 2359   07/25/19 0915  ceFAZolin (ANCEF)  IVPB 2g/100 mL premix        2 g 200 mL/hr over 30 Minutes Intravenous On call 07/25/19 0908 07/25/19 1307       Assessment/Plan COPD/100+ pack year history -currently @ 1/2 PPD Rheumatoid arthritis Atrial fibrillation - controlled on cardizem gtt, hep gtt Dehydration/acute renal failure - Cr 1.41 from 1.14 PMH RA on methotrexate   Small bowel obstruction with history of colon resection in 1980s S/p ex lap, enterolysis, decompressive enterotomy and SB anastomosis 6/4 Dr. Hassell Done - POD#6, afebrile, WBC 8 on 6/8 - NGT removed by pt overnight, 950 cc first shift yesterday, however patient now having flatus and 2 large BMs - ok to leave NG tube out and monitor for  nausea/distention, if does well and continues to have bowel function then ok to start CLD later today or tomorrow. - OOB, PT/OT - IS 10x q 1h, flutter valve  - dry-to-dry dressing changes BID to abd wound and monitor   FEN: N.p.o./IV fluids, continue TNA ID: Zosyn 6/7 , started by primary team for potential HCAP VTE: heparin gtt, plan to transition to coumadin once taking PO GU: condom cath Follow-up: Dr. Hassell Done    LOS: 9 days     Jill Alexanders , [facility] 07/31/2019, 8:57 AM Please see Amion for pager number during day hours 7:00am-4:30pm

## 2019-07-31 NOTE — Progress Notes (Signed)
RN found Pt NG tube out of nare. Pt states that he wants pudding and tube can come out. On call provider made aware. Awaiting new orders if any.

## 2019-07-31 NOTE — Progress Notes (Addendum)
Progress Note  Patient Name: Jake [name] Date of Encounter: 07/31/2019  Primary Cardiologist:  No primary care provider on file.  Dr Claudie Leach from [facility] (assigned to him, he has seen [name], [facility])  Subjective   Doing well today.  Denies any chest pain or SOB.  Remains in afib with HR in low 100s  Inpatient Medications    Scheduled Meds: . budesonide (PULMICORT) nebulizer solution  0.5 mg Nebulization BID  . chlorhexidine  15 mL Mouth Rinse BID  . Chlorhexidine Gluconate Cloth  6 each Topical Daily  . enoxaparin (LOVENOX) injection  1 mg/kg Subcutaneous Q12H  . insulin aspart  0-20 Units Subcutaneous Q4H  . ipratropium  0.5 mg Nebulization BID  . levalbuterol  0.63 mg Nebulization BID  . mouth rinse  15 mL Mouth Rinse BID  . sodium chloride flush  10-40 mL Intracatheter Q12H   Continuous Infusions: . diltiazem (CARDIZEM) infusion 15 mg/hr (07/31/19 1001)  . piperacillin-tazobactam (ZOSYN)  IV 12.5 mL/hr at 07/31/19 0818  . TPN ADULT (ION) 75 mL/hr at 07/31/19 0818  . TPN ADULT (ION)     PRN Meds: guaiFENesin-dextromethorphan, HYDROmorphone (DILAUDID) injection, labetalol, levalbuterol, methocarbamol, ondansetron **OR** ondansetron (ZOFRAN) IV, polyethylene glycol, senna-docusate, sodium chloride flush   Vital Signs    Vitals:   07/31/19 0500 07/31/19 0800 07/31/19 0821 07/31/19 0847  BP:   118/78   Pulse: 99     Resp: 19  (!) 21   Temp:  98.4 F (36.9 C)    TempSrc:  Oral    SpO2: 100%   92%  Weight:      Height:        Intake/Output Summary (Last 24 hours) at 07/31/2019 1035 Last data filed at 07/31/2019 0818 Gross per 24 hour  Intake 3305.32 ml  Output 2750 ml  Net 555.32 ml   Filed Weights   07/22/19 1812 07/22/19 2330  Weight: 95.3 kg 92.2 kg   Last Weight  Most recent update: 07/22/2019 11:56 PM   Weight  92.2 kg (203 lb 4.2 oz)           Weight change:    Telemetry  afib in low 100's- Personally Reviewed  ECG    No new EKG to  review- Personally Reviewed  Physical Exam  GEN: Well nourished, well developed in no acute distress HEENT: Normal NECK: No JVD; No carotid bruits LYMPHATICS: No lymphadenopathy CARDIAC:irregularly irregular, no murmurs, rubs, gallops RESPIRATORY:  Clear to auscultation without rales, wheezing or rhonchi  ABDOMEN: Soft, non-tender, non-distended MUSCULOSKELETAL:  No edema; No deformity  SKIN: Warm and dry NEUROLOGIC:  Alert and oriented x 3 PSYCHIATRIC:  Normal affect    Labs    Hematology Recent Labs  Lab 07/27/19 0307 07/28/19 0310 07/29/19 0304  WBC 8.2 9.9 8.4  RBC 3.20* 3.27* 3.32*  HGB 11.3* 11.6* 11.7*  HCT 34.3* 34.9* 35.1*  MCV 107.2* 106.7* 105.7*  MCH 35.3* 35.5* 35.2*  MCHC 32.9 33.2 33.3  RDW 13.8 13.9 13.7  PLT 151 157 155    Chemistry Recent Labs  Lab 07/29/19 1600 07/30/19 0220 07/31/19 0500  NA 138 140 140  K 3.0* 3.4* 3.7  CL 87* 84* 89*  CO2 40* 40* 36*  GLUCOSE 147* 117* 129*  BUN 38* 40* 39*  CREATININE 0.91 1.04 0.93  CALCIUM 8.2* 8.4* 9.3  PROT  --  5.9* 6.7  ALBUMIN  --  2.7* 3.0*  AST  --  22 45*  ALT  --  15 30  ALKPHOS  --  43 70  BILITOT  --  1.7* 1.6*  GFRNONAA >60 >60 >60  GFRAA >60 >60 >60  ANIONGAP 11 16* 15     High Sensitivity Troponin:   Recent Labs  Lab 07/23/19 0300  TROPONINIHS 24*      BNP Recent Labs  Lab 07/27/19 0751 07/29/19 0308  BNP 269.2* 378.9*     DDimer No results for input(s): DDIMER in the last 168 hours.   Radiology    DG Abd 1 View  Result Date: 07/29/2019 CLINICAL DATA:  NG tube placement EXAM: ABDOMEN - 1 VIEW COMPARISON:  07/25/2019 FINDINGS: NG tube remains in stable position with the tip in the proximal stomach and the side port in the distal esophagus. Multiple dilated loops of small bowel again noted, unchanged. IMPRESSION: NG tube in stable position with the tip in the proximal stomach. Electronically Signed   By: Rolm Baptise M.D.   On: 07/29/2019 11:57   DG Chest Port 1  View  Result Date: 07/29/2019 CLINICAL DATA:  Shortness of breath EXAM: PORTABLE CHEST 1 VIEW COMPARISON:  07/28/2019 FINDINGS: Cardiac shadow is stable. Left-sided PICC line is again noted at the cavoatrial junction. Gastric catheter has been removed in the interval. Improved aeration is noted in the bases bilaterally. No new focal confluent infiltrate is seen. No bony abnormality is noted. IMPRESSION: Improved aeration in the bases.  No acute abnormality noted. Electronically Signed   By: Inez Catalina M.D.   On: 07/29/2019 08:23   DG CHEST PORT 1 VIEW  Result Date: 07/28/2019 CLINICAL DATA:  Central venous catheter placement EXAM: PORTABLE CHEST 1 VIEW COMPARISON:  July 28, 2019 study obtained earlier in the day FINDINGS: Central catheter tip is at the cavoatrial junction. Nasogastric tube tip and side port are in the stomach. No pneumothorax. There is airspace consolidation in the lung bases, more severe on the right than on the left, stable. No new opacity evident. Heart size and pulmonary vascularity are normal. No adenopathy. There is aortic atherosclerosis. No bone lesions. IMPRESSION: Tube and catheter positions as described without pneumothorax. Bibasilar consolidation, more severe on the right than the left, stable. Cardiac silhouette within normal limits. Aortic Atherosclerosis (ICD10-I70.0). Electronically Signed   By: Lowella Grip III M.D.   On: 07/28/2019 19:20   DG CHEST PORT 1 VIEW  Result Date: 07/28/2019 CLINICAL DATA:  Central venous catheter placement EXAM: PORTABLE CHEST 1 VIEW COMPARISON:  07/28/2019 FINDINGS: Single frontal view of the chest demonstrates left-sided PICC tip overlying superior vena cava. Enteric catheter passes below diaphragm side port projecting over gastric fundus. Tip is excluded by collimation. Cardiac silhouette is stable. There is persistent central vascular congestion with bibasilar consolidation, right greater than left. No effusion or pneumothorax.  IMPRESSION: 1. Support devices as above. 2. Persistent central vascular congestion and bibasilar consolidation, stable. Electronically Signed   By: Randa Ngo M.D.   On: 07/28/2019 16:54   DG CHEST PORT 1 VIEW  Result Date: 07/28/2019 CLINICAL DATA:  Shortness of breath, cough EXAM: PORTABLE CHEST 1 VIEW COMPARISON:  Radiograph 07/27/2018 FINDINGS: Transesophageal tube tip distal to the GE junction, terminating below the level of imaging. Telemetry leads overlie the chest. Lung volumes are low with some streaky basilar opacities favoring atelectasis. Increasing opacity in the right lung base could reflect worsening atelectasis or developing airspace disease. Central vascular crowding is noted. No convincing features of edema. The aorta is calcified. The remaining cardiomediastinal contours are unremarkable. No  acute osseous or soft tissue abnormality. Degenerative changes are present in the imaged spine and shoulders. IMPRESSION: Transesophageal tube tip below the GE junction. Low lung volumes with some streaky basilar opacities favoring atelectasis. More patchy increased opacity in the right lung base could reflect further volume loss or developing airspace disease. Electronically Signed   By: Lovena Le M.D.   On: 07/28/2019 04:08   [place] EKG SITE RITE  Result Date: 07/28/2019 If Site Rite image not attached, placement could not be confirmed due to current cardiac rhythm.    Cardiac Studies   ECHO:  07/22/2019 1. Left ventricular ejection fraction, by estimation, is 55 to 60%. The  left ventricle has normal function. The left ventricle has no regional  wall motion abnormalities. Left ventricular diastolic parameters are  indeterminate.  2. Right ventricular systolic function is normal. The right ventricular  size is mildly enlarged. There is mildly elevated pulmonary artery  systolic pressure. The estimated right ventricular systolic pressure is  27.7 mmHg.  3. The mitral valve is normal  in structure. Trivial mitral valve  regurgitation. No evidence of mitral stenosis.  4. The aortic valve is tricuspid. Aortic valve regurgitation is not  visualized. No aortic stenosis is present.  5. The inferior vena cava is normal in size with greater than 50%  respiratory variability, suggesting right atrial pressure of 3 mmHg.  6. The patient was in atrial fibrillation.   Patient Profile     74 y.o. 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  Assessment & Plan    1. SBO - pulled NGT out now with large BM - s/p lab chole with enterolysis and decompressive enterotomy with SB anastamosis - surgery following  2. Atrial fib, RVR -HR improved after placing back on Cardizem gtt>>? Poor absorption of PO -HR in the low 100's on IV Cardizem which we will continue until his bowel function has improved and we can be assured that he will absorb PO -avoid BB in setting of wheezing -Mr. Harps could not afford Xarelto and he does not remember discussing Coumadin with his PCP. -he was taking baby aspirin a few times a week. -Currently on full dose Lovenox SQ and recommend changing to Coumadin once ok by surgery- CHA2DS2-VASc is 3 (age x 2, HTN)  3. COPD/exacerbation/Acute respiratory failure with hypoxia - continue to have wheezing  - concern for HCAP and likely CHF - getting nebs and antibx - per TRH  4.  HTN -BP controlled at 118/50mmHg -continue IV Cardizem gtt for now -he was on Lisinopril and HCTZ at home currently on hold  5.  Acute diastolic CHF -BNP mildly elevated although Cxray with no edema -likely related to afib with RVR recently -he has put out 1.8L UOP yesterday and is net -672cc overall -fluids have been stopped -creatinine stable at 1.04 -BNP elevated at 378. Cxray with no edema -Continue Lasix  PNR   Signed, Fransico Him, MD 10:35 AM 07/31/2019

## 2019-07-31 NOTE — Progress Notes (Signed)
MD states to re insert NG tube. Pt is A/OX4 and is refusing to let RN re insert NG tube. RN advised Pt to make RN aware if emesis occurs. MD states that if emesis occurs to promptly reinsert NG tube. Will continue to monitor.

## 2019-07-31 NOTE — Progress Notes (Signed)
RN notified by tele that patient had run of vtach. Dr Hal Hope informed. Provider ordered labs and ekg. Labs drawn and sent to lab and ekg complete.

## 2019-07-31 NOTE — Progress Notes (Signed)
PHARMACY - TOTAL PARENTERAL NUTRITION CONSULT NOTE   Indication: bowel obstruction   Patient Measurements: Height: 6\' 1"  (185.4 cm) Weight: 92.2 kg (203 lb 4.2 oz) IBW/kg (Calculated) : 79.9 TPN AdjBW (KG): 92.2 Body mass index is 26.82 kg/m. Usual Weight: 100 kg in 12/2018  Assessment: 74 yo male presented on 07/22/2019 with abdominal pain, nausea, vomiting found to have SBO. Patient reports appetite "came and went" prior to admission and reports last food consumed 2 days prior to admission. Patient has been NPO since admission and underwent exploratory lap with enterolysis, decompressive enterotomy, and small bowel anastomosis on 6/4.   Glucose / Insulin: No hx of DM. A1c 5.2. CBGs 120-140s - at goal. On rSSI q4h. 15 units/24 hours Electrolytes: Cl 89 - slight increase. Corrected Ca 10.1 - wnl but increased from 9.44. Other electrolytes wnl.  Renal: Scr 0.93 - stable. 2364ml output with diuresis yesterday.  LFTs / TGs: AST 45 - increase. Tbili 1.6 - improved. TG 107 (6/8).  Prealbumin / albumin: Prealbumin 6.0 (6/8). Albumin 3.0.  Intake / Output; MIVF: 1300 ml/24 hrs NG output. Negative 2L.  GI Imaging: 6/1 CT: mid to distal SBO 6/2 x-ray: persistent SBO 6/3 x-ray: interval improvement in dilated small bowel loops Surgeries / Procedures:  6/4 ex lap, enterolysis, decompressive enterotomy and SB anastomosis   Central access: PICC placed 6/7 TPN start date: 6/7  Nutritional Goals (RD recommendation on 6/8 per verbal conversation with Jarome Matin): kCal: 2100-2300, Protein: 105-120g, Fluid: >2L Goal TPN rate is 90 mL/hr (provides 114g of protein and 2230 kcals per day)  Current Nutrition:  NPO TPN started on 6/7   Plan:  Change from [place] to Travasol given patient's O2 status appears stable on 2L [place] (though appears no O2 PTA) and monitor fluid status  Adjust TPN to 90 mL/hr which is at goal rate with transition from [employer] to Travasol  Adjusted electrolytes in TPN: Ca  4.3 mEq/L, Mg decreased to 5 mEq/L Continued electrolytes in TPN: K 40 mEq/L, Phos 15 mmol/L, Na 90 mEq/L, Cl:Ac ratio max chloride  Add standard MVI and trace elements to TPN Continue resistant q4h SSI and adjust as needed  Monitor TPN labs on Mon/Thurs, recheck electrolytes tomorrow   [name], PharmD PGY1 Pharmacy Resident [employer]: [phone]  07/31/2019,7:29 AM

## 2019-07-31 NOTE — Progress Notes (Signed)
PROGRESS NOTE    Jake [name]  DUK:[id number] DOB: [birth year] DOA: 07/22/2019 PCP: Glendon Axe, MD   Brief Narrative:  74 y.o.malewithhistory of hypertension rheumatoid arthritis, AAA without rupture, hepatitis C COPD presented to the ER with complaints of abdominal pain and nausea vomiting. In the ER CT abdomen pelvis shows small bowel obstruction and on-call general surgeon Dr. Harlow Asa was consulted who requested NG tube placement and will be seeing patient in consult.  Initially failed medical management therefore surgical intervention was required of 6/4.  Hospital course also complicated by atrial fibrillation with RVR, cardiology consulted.    Assessment & Plan:   Principal Problem:   SBO (small bowel obstruction) (HCC) Active Problems:   A-fib (HCC)   Atrial fibrillation with rapid ventricular response (HCC)   Acute diastolic CHF (congestive heart failure) ([place])   Essential hypertension  Acute hypoxic respiratory failure/COPD with Hypoxia, down to room air Condom catheter in place No further need for diuretics for now. Supplemental oxygen as needed, bronchodilators Xopenex. Ipratropium; IS/Flutter Supplemental oxygen as needed As needed Lasix as needed going forward  Small bowel obstruction; mechanical Status post ex lap/enterolysis, decompressive enterotomy 6/4 Failed medical management therefore require surgical intervention on 6/4 Diet per Gen Surgery Dressing per surgery NG tube accidentally removed yesterday.  Diet as tolerated, clear trials later today.   Atrial fibrillation with RVR, new onset, rate improved Echocardiogram shows EF of 55-60% Currently on IV Cardizem.  Cardiology following. TPN via PICC line.  Lovenox 1 mg/kg every 12 hours.  Eventually p.o. Coumadin Replete mag potassium as needed  Essential hypertension Home HCTZ and lisinopril on hold Continue Cardizem  History of rheumatoid arthritis on methotrexate presently n.p.o.  Mild renal  insufficiency, baseline creatinine 1.1 -Creatinine peaked at 1.4.  Today 1.1    DVT prophylaxis: Currently on full dose Lovenox Code Status: Full code Family Communication:None at bedside.  Status is: Inpatient  Remains inpatient appropriate because:IV treatments appropriate due to intensity of illness or inability to take PO   Dispo: The patient is from: Home              Anticipated d/c is to: Home              Anticipated d/c date is: 3 days              Patient currently is not medically stable to d/c.  Unsafe for discharge due to multiple ongoing issues-awaiting bowel function to return.  In the meantime he is on multiple IV treatments  Subjective: Feels ok. Accidentally his NG tube came out ovenright.  No nausea or vomiting this morning. Passing gas.   Review of Systems Otherwise negative except as per HPI, including: General = no fevers, chills, dizziness,  fatigue HEENT/EYES = negative for loss of vision, double vision, blurred vision,  sore throa Cardiovascular= negative for chest pain, palpitation Respiratory/lungs= negative for shortness of breath, cough, wheezing; hemoptysis,  Gastrointestinal= negative for nausea, vomiting, abdominal pain Genitourinary= negative for Dysuria MSK = Negative for arthralgia, myalgias Neurology= Negative for headache, numbness, tingling  Psychiatry= Negative for suicidal and homocidal ideation Skin= Negative for Rash  Examination: Constitutional: Not in acute distress Respiratory: bibasilar crackles.  Cardiovascular: Normal sinus rhythm, no rubs Abdomen: Nontender nondistended good bowel sounds Musculoskeletal: No edema noted Skin: No rashes seen Neurologic: CN 2-12 grossly intact.  And nonfocal Psychiatric: Normal judgment and insight. Alert and oriented x 3. Normal mood.   PICC line noted-left upper extremity External Foley catheter Surgical scar  on the abdomen noted Objective: Vitals:   07/31/19 0500 07/31/19 0800  07/31/19 0821 07/31/19 0847  BP:   118/78   Pulse: 99     Resp: 19  (!) 21   Temp:  98.4 F (36.9 C)    TempSrc:  Oral    SpO2: 100%   92%  Weight:      Height:        Intake/Output Summary (Last 24 hours) at 07/31/2019 0951 Last data filed at 07/31/2019 0818 Gross per 24 hour  Intake 3305.32 ml  Output 2750 ml  Net 555.32 ml   Filed Weights   07/22/19 1812 07/22/19 2330  Weight: 95.3 kg 92.2 kg     Data Reviewed:   CBC: Recent Labs  Lab 07/25/19 0200 07/26/19 0542 07/27/19 0307 07/28/19 0310 07/29/19 0304  WBC 10.1 7.8 8.2 9.9 8.4  NEUTROABS  --   --   --   --  7.1  HGB 12.7* 11.8* 11.3* 11.6* 11.7*  HCT 38.7* 34.6* 34.3* 34.9* 35.1*  MCV 106.9* 106.1* 107.2* 106.7* 105.7*  PLT 170 142* 151 157 419   Basic Metabolic Panel: Recent Labs  Lab 07/27/19 0307 07/27/19 0307 07/28/19 0310 07/29/19 0304 07/29/19 1600 07/30/19 0220 07/31/19 0500  NA 137   < > 135 134* 138 140 140  K 3.7   < > 3.5 3.2* 3.0* 3.4* 3.7  CL 94*   < > 91* 88* 87* 84* 89*  CO2 32   < > 32 31 40* 40* 36*  GLUCOSE 94   < > 93 133* 147* 117* 129*  BUN 37*   < > 42* 42* 38* 40* 39*  CREATININE 0.94   < > 1.12 1.11 0.91 1.04 0.93  CALCIUM 8.6*   < > 8.4* 8.0* 8.2* 8.4* 9.3  MG 2.0  --  2.0 1.7  --  2.1 2.4  PHOS  --   --   --  3.1  --   --  3.4   < > = values in this interval not displayed.   GFR: Estimated Creatinine Clearance: 77.6 mL/min (by C-G formula based on SCr of 0.93 mg/dL). Liver Function Tests: Recent Labs  Lab 07/30/19 0220 07/31/19 0500  AST 22 45*  ALT 15 30  ALKPHOS 43 70  BILITOT 1.7* 1.6*  PROT 5.9* 6.7  ALBUMIN 2.7* 3.0*   No results for input(s): LIPASE, AMYLASE in the last 168 hours. No results for input(s): AMMONIA in the last 168 hours. Coagulation Profile: No results for input(s): INR, PROTIME in the last 168 hours. Cardiac Enzymes: No results for input(s): CKTOTAL, CKMB, CKMBINDEX, TROPONINI in the last 168 hours. BNP (last 3 results) No results  for input(s): PROBNP in the last 8760 hours. HbA1C: Recent Labs    07/28/19 1018  HGBA1C 5.2   CBG: Recent Labs  Lab 07/30/19 1542 07/30/19 2004 07/31/19 0013 07/31/19 0401 07/31/19 0729  GLUCAP 138* 138* 149* 142* 142*   Lipid Profile: Recent Labs    07/29/19 0304  TRIG 107   Thyroid Function Tests: No results for input(s): TSH, T4TOTAL, FREET4, T3FREE, THYROIDAB in the last 72 hours. Anemia Panel: No results for input(s): VITAMINB12, FOLATE, FERRITIN, TIBC, IRON, RETICCTPCT in the last 72 hours. Sepsis Labs: Recent Labs  Lab 07/28/19 0513  PROCALCITON 0.85    Recent Results (from the past 240 hour(s))  SARS Coronavirus 2 by RT PCR (hospital order, performed in [facility] hospital lab) Nasopharyngeal Nasopharyngeal Swab     Status:  None   Collection Time: 07/22/19  6:54 PM   Specimen: Nasopharyngeal Swab  Result Value Ref Range Status   SARS Coronavirus 2 NEGATIVE NEGATIVE Final    Comment: (NOTE) SARS-CoV-2 target nucleic acids are NOT DETECTED. The SARS-CoV-2 RNA is generally detectable in upper and lower respiratory specimens during the acute phase of infection. The lowest concentration of SARS-CoV-2 viral copies this assay can detect is 250 copies / mL. A negative result does not preclude SARS-CoV-2 infection and should not be used as the sole basis for treatment or other patient management decisions.  A negative result may occur with improper specimen collection / handling, submission of specimen other than nasopharyngeal swab, presence of viral mutation(s) within the areas targeted by this assay, and inadequate number of viral copies (<250 copies / mL). A negative result must be combined with clinical observations, patient history, and epidemiological information. Fact Sheet for Patients:   StrictlyIdeas.no Fact Sheet for Healthcare Providers: BankingDealers.co.za This test is not yet approved or cleared   by the [place] FDA and has been authorized for detection and/or diagnosis of SARS-CoV-2 by FDA under an Emergency Use Authorization (EUA).  This EUA will remain in effect (meaning this test can be used) for the duration of the COVID-19 declaration under Section 564(b)(1) of the Act, 21 U.S.C. section 360bbb-3(b)(1), unless the authorization is terminated or revoked sooner. Performed at [facility], [address] [place], [place] [address]   MRSA PCR Screening     Status: None   Collection Time: 07/22/19 11:25 PM   Specimen: Nasal Mucosa; Nasopharyngeal  Result Value Ref Range Status   MRSA by PCR NEGATIVE NEGATIVE Final    Comment:        The GeneXpert MRSA Assay (FDA approved for NASAL specimens only), is one component of a comprehensive MRSA colonization surveillance program. It is not intended to diagnose MRSA infection nor to guide or monitor treatment for MRSA infections. Performed at [facility], [place] [address], [place], [place] [address]          Radiology Studies: DG Abd 1 View  Result Date: 07/29/2019 CLINICAL DATA:  NG tube placement EXAM: ABDOMEN - 1 VIEW COMPARISON:  07/25/2019 FINDINGS: NG tube remains in stable position with the tip in the proximal stomach and the side port in the distal esophagus. Multiple dilated loops of small bowel again noted, unchanged. IMPRESSION: NG tube in stable position with the tip in the proximal stomach. Electronically Signed   By: Rolm Baptise M.D.   On: 07/29/2019 11:57        Scheduled Meds: . budesonide (PULMICORT) nebulizer solution  0.5 mg Nebulization BID  . chlorhexidine  15 mL Mouth Rinse BID  . Chlorhexidine Gluconate Cloth  6 each Topical Daily  . enoxaparin (LOVENOX) injection  1 mg/kg Subcutaneous Q12H  . insulin aspart  0-20 Units Subcutaneous Q4H  . ipratropium  0.5 mg Nebulization BID  . levalbuterol  0.63 mg Nebulization BID  . mouth rinse  15 mL Mouth Rinse BID  .  sodium chloride flush  10-40 mL Intracatheter Q12H   Continuous Infusions: . diltiazem (CARDIZEM) infusion 15 mg/hr (07/31/19 0818)  . piperacillin-tazobactam (ZOSYN)  IV 12.5 mL/hr at 07/31/19 0818  . TPN ADULT (ION) 75 mL/hr at 07/31/19 0818  . TPN ADULT (ION)       LOS: 9 days   Time spent= 35 mins    Rossanna Spitzley Arsenio Loader, MD [facility]  If 7PM-7AM, please  contact night-coverage  07/31/2019, 9:51 AM

## 2019-07-31 NOTE — Evaluation (Signed)
Clinical/Bedside Swallow Evaluation Patient Details  Name: Jake [name] MRN: [id number] Date of Birth: 1945/11/20  Today's Date: 07/31/2019 Time: SLP Start Time (ACUTE ONLY): 6948 SLP Stop Time (ACUTE ONLY): 1501 SLP Time Calculation (min) (ACUTE ONLY): 24 min  Past Medical History:  Past Medical History:  Diagnosis Date  . AAA (abdominal aortic aneurysm) without rupture ([place])   . COPD (chronic obstructive pulmonary disease) ([place])   . Hepatitis A   . Hypertension   . PAF (paroxysmal atrial fibrillation) ([place])   . Rheumatoid arthritis [facility])    Past Surgical History:  Past Surgical History:  Procedure Laterality Date  . BACK SURGERY    . FRACTURE SURGERY    . JOINT REPLACEMENT    . LAPAROTOMY N/A 07/25/2019   Procedure: EXPLORATORY LAPAROTOMY,REPAIR AND ANASTOMOSIS OF SMALL BOWEL, ENTEROLYSIS;  Surgeon: [name], MD;  Location: [facility] ORS;  Service: General;  Laterality: N/A;   HPI:  74 yo male adm to [facility] with SBO s/p surgery complicated by Afib with RVR per MD notes.  Pt had an NG in place but it was removed accidentally overnight 07/30/2019.  Pt states he feels "a little something at times" pointing to distal pharynx but denies odynophagia.  Suspect acute sensation due to NG placement.    Swallow evaluation ordered to assess swallow function.  Pt CXR negative for acute findings.  Pt reports hospital admission for right facial pain and dysphagia in 2020 and was consuming soft (but sounds like full liquid) diet acutely - dysphagia resolved and pt diet was advanced to regular/thin.  Pt then admitted to [facility] with right facial pain - and CT maxillofacial negative for abscess.  Pt states he was given an alternative ABX that "wiped it all out".  He denies dysphagia since this admission.  Pt uses dentures for eating - SLP did not place them as pt consuming only small amounts of ice chips.  Pt also has h/o COPD, RA, Hep C.   Assessment / Plan / Recommendation Clinical Impression  Pt with  unremarkable CN exam re: swallowing nerves and clear, strong voice.  Minimal intake of ice chips and water observed with no indications of aspiration with intake.  Baseline reflexive cough was strong and productive.  Cough during session was not coorelated to po intake and pt denies having "problems swallowing",  "Getting strangled" or "choking" prior to admission.    Pt does have h/o oral odynophagia due to right facial ? cellulitis? requiring a "soft diet" - (sounds as if full liquid from description) treated with ABX last in November 2020.  He reports this completely resolved.  Pt states swallow function currently is normal.    Given his COPD, SLP Provided him written tips to compensate for possible episodic dysphagia/aspiration due to decreased reciprocity of swallow/respirations if dyspenic.    Using teach back, pt educated to all findings.  Defer to surgery for dietary advancement recommendations as consistency deviation does not appear indicated. Thanks so much for this consult.   SLP Visit Diagnosis: Dysphagia, unspecified (R13.10)    Aspiration Risk  Mild aspiration risk    Diet Recommendation  (defer to surgery, advancing to regular/thin when indicated by surgery)   Liquid Administration via: Cup;Straw Medication Administration: Whole meds with liquid (with puree if problematic) Compensations: Slow rate;Small sips/bites Postural Changes: Seated upright at 90 degrees;Remain upright for at least 30 minutes after po intake    Other  Recommendations Oral Care Recommendations: Oral care BID   Follow up Recommendations None  Frequency and Duration   n/a         Prognosis   n/a     Swallow Study   General Date of Onset: 07/31/19 HPI: 74 yo male adm to [facility] with SBO s/p surgery complicated by Afib with RVR per MD notes.  Pt had an NG in place but it was removed accidentally overnight 07/30/2019.  Pt states he feels "a little something at times" pointing to distal pharynx but  denies odynophagia.  Suspect acute sensation due to NG placement.    Swallow evaluation ordered to assess swallow function.  Pt CXR negative for acute findings.  Pt reports hospital admission for right facial pain and dysphagia in 2020 and was consuming soft (but sounds like full liquid) diet acutely - dysphagia resolved and pt diet was advanced to regular/thin.  Pt then admitted to [facility] with right facial pain - and CT maxillofacial negative for abscess.  Pt states he was given an alternative ABX that "wiped it all out".  He denies dysphagia since this admission.  Pt uses dentures for eating - SLP did not place them as pt consuming only small amounts of ice chips.  Pt also has h/o COPD, RA, Hep C. Type of Study: Bedside Swallow Evaluation Previous Swallow Assessment: from pt description, he underwent a BSE at [facility] Diet Prior to this Study: NPO (ice chips) Temperature Spikes Noted: No Respiratory Status: Nasal cannula (2 liters) History of Recent Intubation: No Behavior/Cognition: Alert;Cooperative;Pleasant mood Oral Cavity Assessment: Dry Oral Care Completed by SLP: No Oral Cavity - Dentition: Edentulous (did not place dentures due to his consumption of ice chips only and for hygeine) Vision: Functional for self-feeding Self-Feeding Abilities: Able to feed self Patient Positioning: Upright in bed Baseline Vocal Quality: Normal Volitional Cough: Other (Comment) (reflexive cough is strong and productive but causes abdomen pain, did not test volitional) Volitional Swallow: Unable to elicit (d/t xerostomia)    Oral/Motor/Sensory Function Overall Oral Motor/Sensory Function: Within functional limits   Ice Chips Ice chips: Within functional limits Presentation: Spoon;Self Fed   Thin Liquid Thin Liquid: Within functional limits Presentation: Cup;Self Fed    Nectar Thick Nectar Thick Liquid: Not tested   Honey Thick Honey Thick Liquid: Not tested   Puree Puree: Not tested    Solid     Solid: Not tested      Macario Golds 07/31/2019,3:28 PM    Kathleen Lime, MS [place] [facility] [phone]

## 2019-07-31 NOTE — Progress Notes (Signed)
Heparin/zosyn CONSULT NOTE - FOLLOW UP   Pharmacy Consult for [facility] & Zosyn Indication: AFib & PNA  Allergies  Allergen Reactions  . Propoxyphene Nausea And Vomiting    Patient Measurements: Height: 6\' 1"  (185.4 cm) Weight: 92.2 kg (203 lb 4.2 oz) IBW/kg (Calculated) : 79.9  Vital Signs: Temp: 98.2 F (36.8 C) (06/10 0400) Temp Source: Oral (06/10 0400) BP: 130/62 (06/10 0400) Pulse Rate: 99 (06/10 0500) Intake/Output from previous day: 06/09 0701 - 06/10 0700 In: 1943.5 [P.O.:120; I.V.:1223.5; IV Piggyback:600] Out: 2750 [Urine:1800; Emesis/NG output:950] Intake/Output from this shift: No intake/output data recorded.  Labs: Recent Labs    07/29/19 0304 07/29/19 0304 07/29/19 1600 07/30/19 0220 07/31/19 0500  WBC 8.4  --   --   --   --   HGB 11.7*  --   --   --   --   HCT 35.1*  --   --   --   --   PLT 155  --   --   --   --   CREATININE 1.11   < > 0.91 1.04 0.93  MG 1.7  --   --  2.1 2.4  PHOS 3.1  --   --   --  3.4  ALBUMIN  --   --   --  2.7* 3.0*  PROT  --   --   --  5.9* 6.7  AST  --   --   --  22 45*  ALT  --   --   --  15 30  ALKPHOS  --   --   --  43 70  BILITOT  --   --   --  1.7* 1.6*   < > = values in this interval not displayed.   Estimated Creatinine Clearance: 77.6 mL/min (by C-G formula based on SCr of 0.93 mg/dL).   Microbiology: Recent Results (from the past 720 hour(s))  SARS Coronavirus 2 by RT PCR (hospital order, performed in [facility] hospital lab) Nasopharyngeal Nasopharyngeal Swab     Status: None   Collection Time: 07/22/19  6:54 PM   Specimen: Nasopharyngeal Swab  Result Value Ref Range Status   SARS Coronavirus 2 NEGATIVE NEGATIVE Final    Comment: (NOTE) SARS-CoV-2 target nucleic acids are NOT DETECTED. The SARS-CoV-2 RNA is generally detectable in upper and lower respiratory specimens during the acute phase of infection. The lowest concentration of SARS-CoV-2 viral copies this assay can detect is 250 copies / mL. A  negative result does not preclude SARS-CoV-2 infection and should not be used as the sole basis for treatment or other patient management decisions.  A negative result may occur with improper specimen collection / handling, submission of specimen other than nasopharyngeal swab, presence of viral mutation(s) within the areas targeted by this assay, and inadequate number of viral copies (<250 copies / mL). A negative result must be combined with clinical observations, patient history, and epidemiological information. Fact Sheet for Patients:   StrictlyIdeas.no Fact Sheet for Healthcare Providers: BankingDealers.co.za This test is not yet approved or cleared  by the [place] FDA and has been authorized for detection and/or diagnosis of SARS-CoV-2 by FDA under an Emergency Use Authorization (EUA).  This EUA will remain in effect (meaning this test can be used) for the duration of the COVID-19 declaration under Section 564(b)(1) of the Act, 21 U.S.C. section 360bbb-3(b)(1), unless the authorization is terminated or revoked sooner. Performed at [facility], [address] [place], [place]  [address]   MRSA PCR Screening     Status: None   Collection Time: 07/22/19 11:25 PM   Specimen: Nasal Mucosa; Nasopharyngeal  Result Value Ref Range Status   MRSA by PCR NEGATIVE NEGATIVE Final    Comment:        The GeneXpert MRSA Assay (FDA approved for NASAL specimens only), is one component of a comprehensive MRSA colonization surveillance program. It is not intended to diagnose MRSA infection nor to guide or monitor treatment for MRSA infections. Performed at [facility], [place] [address] [place], [place] [address]    Assessment: D#4 zosyn for HCAP coverage.  AF, WBC WNL. Lovenox for Afib CBC WNL, SCr WNL, no bleeding reported  Plan:  Continue zosyn 3.375 gm IV q8h, infuse each dose over 4 hours- last  day 6/11 per [facility] note 6/9 Continue LWMH 1 mg/kg sq q12  Start warfarin once able to take PO/cleared by [place], Pharm.D 07/31/2019 7:40 AM

## 2019-08-01 LAB — BASIC METABOLIC PANEL
Anion gap: 12 (ref 5–15)
BUN: 40 mg/dL — ABNORMAL HIGH (ref 8–23)
CO2: 32 mmol/L (ref 22–32)
Calcium: 8.2 mg/dL — ABNORMAL LOW (ref 8.9–10.3)
Chloride: 93 mmol/L — ABNORMAL LOW (ref 98–111)
Creatinine, Ser: 0.92 mg/dL (ref 0.61–1.24)
GFR calc Af Amer: >60 mL/min (ref >60)
GFR calc non Af Amer: >60 mL/min (ref >60)
Glucose, Bld: 142 mg/dL — ABNORMAL HIGH (ref 70–99)
Potassium: 3.3 mmol/L — ABNORMAL LOW (ref 3.5–5.1)
Sodium: 137 mmol/L (ref 135–145)

## 2019-08-01 LAB — MAGNESIUM: Magnesium: 1.9 mg/dL (ref 1.7–2.4)

## 2019-08-01 LAB — GLUCOSE, CAPILLARY
Glucose-Capillary: 111 mg/dL — ABNORMAL HIGH (ref 70–99)
Glucose-Capillary: 129 mg/dL — ABNORMAL HIGH (ref 70–99)
Glucose-Capillary: 136 mg/dL — ABNORMAL HIGH (ref 70–99)
Glucose-Capillary: 139 mg/dL — ABNORMAL HIGH (ref 70–99)
Glucose-Capillary: 151 mg/dL — ABNORMAL HIGH (ref 70–99)
Glucose-Capillary: 155 mg/dL — ABNORMAL HIGH (ref 70–99)

## 2019-08-01 LAB — PHOSPHORUS: Phosphorus: 4 mg/dL (ref 2.5–4.6)

## 2019-08-01 MED ORDER — POTASSIUM CHLORIDE 10 MEQ/100ML IV SOLN
10.0000 meq | INTRAVENOUS | Status: AC
  Administered 2019-08-01 (×4): 10 meq via INTRAVENOUS
  Filled 2019-08-01 (×4): qty 100

## 2019-08-01 MED ORDER — TRAVASOL 10 % IV SOLN
INTRAVENOUS | Status: AC
  Filled 2019-08-01: qty 1144.8

## 2019-08-01 MED ORDER — WARFARIN - PHARMACIST DOSING INPATIENT: Freq: Every day | Status: DC

## 2019-08-01 MED ORDER — DOCUSATE SODIUM 100 MG PO CAPS
100.0000 mg | ORAL_CAPSULE | Freq: Two times a day (BID) | ORAL | Status: DC
  Administered 2019-08-01: 100 mg via ORAL
  Filled 2019-08-01: qty 1

## 2019-08-01 MED ORDER — WARFARIN SODIUM 5 MG PO TABS: 5.0000 mg | ORAL_TABLET | Freq: Once | ORAL | Status: DC

## 2019-08-01 MED ORDER — SILVER NITRATE-POT NITRATE 75-25 % EX MISC
10.0000 "application " | CUTANEOUS | Status: DC | PRN
  Filled 2019-08-01: qty 10

## 2019-08-01 MED ORDER — POLYETHYLENE GLYCOL 3350 17 G PO PACK
17.0000 g | PACK | Freq: Every day | ORAL | Status: DC
  Administered 2019-08-01: 17 g via ORAL
  Filled 2019-08-01: qty 1

## 2019-08-01 MED ORDER — DILTIAZEM HCL 60 MG PO TABS
60.0000 mg | ORAL_TABLET | Freq: Three times a day (TID) | ORAL | Status: DC
  Administered 2019-08-01 – 2019-08-05 (×12): 60 mg via ORAL
  Filled 2019-08-01 (×12): qty 1

## 2019-08-01 NOTE — Progress Notes (Addendum)
[place] for LMWH + warfarin Indication: atrial fibrillation  Allergies  Allergen Reactions  . Propoxyphene Nausea And Vomiting    Patient Measurements: Height: 6\' 1"  (185.4 cm) Weight: 92.2 kg (203 lb 4.2 oz) IBW/kg (Calculated) : 79.9  Vital Signs: Temp: 97.7 F (36.5 C) (06/11 1200) Temp Source: Oral (06/11 1200) BP: 121/55 (06/11 1626) Pulse Rate: 81 (06/11 1400)  Labs: Recent Labs    07/31/19 0500 07/31/19 2138 08/01/19 0232  CREATININE 0.93 0.92 0.92  TROPONINIHS  --  27*  --     Estimated Creatinine Clearance: 78.4 mL/min (by C-G formula based on SCr of 0.92 mg/dL).   Medications:  Medications Prior to Admission  Medication Sig Dispense Refill Last Dose  . aspirin 81 MG tablet Take 81 mg by mouth every other day.    Past Week at Unknown time  . folic acid (FOLVITE) 1 MG tablet Take 1 mg by mouth daily.   Past Week at Unknown time  . gabapentin (NEURONTIN) 300 MG capsule Take 300 mg by mouth 2 (two) times daily. Reported on 06/03/2015   Past Week at Unknown time  . hydrochlorothiazide (HYDRODIURIL) 25 MG tablet Take 25 mg by mouth daily. Reported on 06/03/2015   Past Week at Unknown time  . lisinopril (PRINIVIL,ZESTRIL) 10 MG tablet Take 10 mg by mouth daily. Reported on 06/03/2015   Past Week at Unknown time  . magnesium oxide (MAG-OX) 400 MG tablet Take 400 mg by mouth daily.    Past Week at Unknown time  . methotrexate 2.5 MG tablet Take 12.5 mg by mouth once a week. tuesdays   Past Week at Unknown time  . VENTOLIN HFA 108 (90 Base) MCG/ACT inhaler Inhale 1-2 puffs into the lungs every 4 (four) hours as needed.    Past Week at Unknown time  . cephALEXin (KEFLEX) 500 MG capsule Take 1 capsule (500 mg total) by mouth 3 (three) times daily. 21 capsule 0   . chlorpheniramine-HYDROcodone (TUSSIONEX PENNKINETIC ER) 10-8 MG/5ML SUER Take 5 mLs by mouth every 12 (twelve) hours as needed for cough. 140 mL 0   .  HYDROcodone-acetaminophen (NORCO/VICODIN) 5-325 MG tablet Take 1 tablet by mouth every 6 (six) hours as needed. 10 tablet 0   . levofloxacin (LEVAQUIN) 500 MG tablet Take 1 tablet (500 mg total) by mouth daily. 7 tablet 0   . predniSONE (DELTASONE) 20 MG tablet Take 2 tablets (40 mg total) by mouth daily. 10 tablet 0   . sildenafil (REVATIO) 20 MG tablet Reported on 06/03/2015  10    Scheduled:  . budesonide (PULMICORT) nebulizer solution  0.5 mg Nebulization BID  . chlorhexidine  15 mL Mouth Rinse BID  . Chlorhexidine Gluconate Cloth  6 each Topical Daily  . diltiazem  60 mg Oral Q8H  . docusate sodium  100 mg Oral BID  . enoxaparin (LOVENOX) injection  1 mg/kg Subcutaneous Q12H  . insulin aspart  0-20 Units Subcutaneous Q4H  . ipratropium  0.5 mg Nebulization BID  . levalbuterol  0.63 mg Nebulization BID  . mouth rinse  15 mL Mouth Rinse BID  . polyethylene glycol  17 g Oral Daily  . sodium chloride flush  10-40 mL Intracatheter Q12H   Infusions:  . diltiazem (CARDIZEM) infusion 15 mg/hr (08/01/19 4034)  . TPN ADULT (ION) 90 mL/hr at 08/01/19 0300  . TPN ADULT (ION)      Assessment: 39 yoM with PAF on ASA only (d/t inability to afford Xarelto),  admitted 6/1 for SBO. Subsequently went into AFib w/ RVR and heparin drip started 6/2. Has since been converted to LMWH q12 and now to add warfarin.   Baseline INR wnl; aPTT not done  Prior anticoagulation: ASA >> UFH gtt >> LMWH 1 mg/kg q12 hr  Significant events:  Today, 08/01/2019:  CBC: hgb low but stable; Plt stable WNL (last checked 6/8)  Per Surgery PA note, midline wound with active bleeding from proximal area this AM, treated with AgNO and packed with gauze;   Major drug interactions: none  Ordered full liquid diet; intake not charted  SCr stable WNL  Goal of Therapy: Heparin level 0.6-1.0 units/ml 4 hr after dose INR 2-3 Monitor platelets by anticoagulation protocol: Yes  Plan:  Surgery instructed RN to hold  tonight's dose of Lovenox and warfarin d/t bleeding at surgical site  No warfarin tonight per Surgery; f/u ability to resume Lovenox and warfarin tomorrow  Daily INR; CBC q72 hr  Monitor for signs of bleeding or thrombosis   Reuel Boom, PharmD, BCPS [phone] 08/01/2019, 4:31 PM

## 2019-08-01 NOTE — Progress Notes (Signed)
PHARMACY - TOTAL PARENTERAL NUTRITION CONSULT NOTE   Indication: bowel obstruction   Patient Measurements: Height: 6\' 1"  (185.4 cm) Weight: 92.2 kg (203 lb 4.2 oz) IBW/kg (Calculated) : 79.9 TPN AdjBW (KG): 92.2 Body mass index is 26.82 kg/m. Usual Weight: 100 kg in 12/2018  Assessment: 74 yo male presented on 07/22/2019 with abdominal pain, nausea, vomiting found to have SBO. Patient reports appetite "came and went" prior to admission and reports last food consumed 2 days prior to admission. Patient has been NPO since admission and underwent exploratory lap with enterolysis, decompressive enterotomy, and small bowel anastomosis on 6/4.   Per RN, patient ~30% or possibly less of clear liquid diet and no nausea or abdominal pain reported this morning.   Glucose / Insulin: No hx of DM. A1c 5.2. CBGs 130-150s - at goal mainly. On rSSI q4h. 20 units/24 hours Electrolytes: K 3.3 - given 40 mEq IV KCl. Cl 93 - increasing. Other electrolytes wnl.  Renal: Scr 0.92 - stable. UOP 0.4.  LFTs / TGs: AST 45 - increase (6/10). Tbili 1.6 - improved (6/10). TG 107 (6/8).  Prealbumin / albumin: Prealbumin 6.0 (6/8). Albumin 3.0 (6/10).  Intake / Output; MIVF: 1300 ml/24 hrs NG output on 6/9 and NG pulled out by pt and not replaced on 6/10.  GI Imaging: 6/1 CT: mid to distal SBO 6/2 x-ray: persistent SBO 6/3 x-ray: interval improvement in dilated small bowel loops Surgeries / Procedures:  6/4 ex lap, enterolysis, decompressive enterotomy and SB anastomosis   Central access: PICC placed 6/7 TPN start date: 6/7  Nutritional Goals (RD recommendation on 6/8 per verbal conversation with Jarome Matin): kCal: 2100-2300, Protein: 105-120g, Fluid: >2L Goal TPN rate is 90 mL/hr (provides 114g of protein and 2230 kcals per day)  Current Nutrition:  Clear liquids started on 6/10; increased to full liquids on 6/11 TPN started on 6/7   Plan:  Continue TPN at goal rate of 41ml/hr  Adjusted electrolytes in  TPN: Ca 5 mEq/L, Mg increased to 8 mEq/L, K increased to 55 mEq/L, Na increased to 100 mEq/L Continued electrolytes in TPN:  Phos 15 mmol/L, Cl:Ac ratio max chloride  Add standard MVI and trace elements to TPN Continue resistant q4h SSI and adjust as needed  Monitor TPN labs on Mon/Thurs, recheck electrolytes tomorrow  Follow up advancement of diet of ability to wean/discontinue TPN, watch CBGs closely and adjust SSI as needed given no hx DM and on rSSI  [name], PharmD PGY1 Pharmacy Resident [employer]: [phone]  08/01/2019,7:39 AM

## 2019-08-01 NOTE — Progress Notes (Signed)
Physical Therapy Treatment Patient Details Name: Jake [name] MRN: [id number] DOB: 13-Oct-1945 Today's Date: 08/01/2019    History of Present Illness Pt admitted with SBO and now s/p Exploratory Laparotomy and Enterolysis Decompressive Enterotomy and Small Bowel Anastomosis. post op afib with RVR, potential HCAP.PMhx of COPD, PAF, RA, AAA, and back surgery    PT Comments    Pt progressing toward PT goals.  incr gait distance/activity tolerance today. Dizzy with initial sitting EOB, BP stable, VSS during PT, on 2L O2 throughout session.    Follow Up Recommendations  Home health PT;Supervision/Assistance - 24 hour     Equipment Recommendations  None recommended by PT    Recommendations for Other Services       Precautions / Restrictions Precautions Precautions: Fall    Mobility  Bed Mobility Overal bed mobility: Needs Assistance Bed Mobility: Supine to Sit     Supine to sit: HOB elevated;Min guard;Min assist     General bed mobility comments: partial turn to L side, use of rail and incr time, cues  to self assist  Transfers Overall transfer level: Needs assistance Equipment used: Rolling walker (2 wheeled) Transfers: Sit to/from Stand Sit to Stand: Min assist         General transfer comment: cues for use of UEs to self assist;  Physical assist to bring wt up and fwd and to balance in initial standing  Ambulation/Gait Ambulation/Gait assistance: Min assist;+2 physical assistance;+2 safety/equipment Gait Distance (Feet): 6 Feet Assistive device: Rolling walker (2 wheeled) Gait Pattern/deviations: Step-to pattern;Decreased step length - right;Decreased step length - left;Trunk flexed Gait velocity: decr   General Gait Details: cues for RW position, posture. fatigues quickly     Stairs             Wheelchair Mobility    Modified Rankin (Stroke Patients Only)       Balance           Standing balance support: Bilateral upper extremity  supported Standing balance-Leahy Scale: Poor                              Cognition Arousal/Alertness: Awake/alert Behavior During Therapy: Flat affect Overall Cognitive Status: Within Functional Limits for tasks assessed                                 General Comments: follows commands consistently today      Exercises General Exercises - Lower Extremity Ankle Circles/Pumps: AROM;10 reps;Both;Seated    General Comments        Pertinent Vitals/Pain Pain Assessment: Faces Faces Pain Scale: Hurts little more Pain Location: abdomen with bed mobility Pain Descriptors / Indicators: Sharp Pain Intervention(s): Limited activity within patient's tolerance;Monitored during session;Repositioned    Home Living                      Prior Function            PT Goals (current goals can now be found in the care plan section) Acute Rehab PT Goals Patient Stated Goal: Less pain, regain IND PT Goal Formulation: With patient Time For Goal Achievement: 08/10/19 Potential to Achieve Goals: Fair Progress towards PT goals: Progressing toward goals    Frequency    Min 3X/week      PT Plan Current plan remains appropriate    Co-evaluation  AM-PAC PT "6 Clicks" Mobility   Outcome Measure  Help needed turning from your back to your side while in a flat bed without using bedrails?: A Little Help needed moving from lying on your back to sitting on the side of a flat bed without using bedrails?: A Little Help needed moving to and from a bed to a chair (including a wheelchair)?: A Little Help needed standing up from a chair using your arms (e.g., wheelchair or bedside chair)?: A Little Help needed to walk in hospital room?: A Lot Help needed climbing 3-5 steps with a railing? : A Lot 6 Click Score: 16    End of Session Equipment Utilized During Treatment: Gait belt Activity Tolerance: Patient tolerated treatment well Patient  left: in chair;with call bell/phone within reach;with chair alarm set;with family/visitor present Nurse Communication: Mobility status PT Visit Diagnosis: Unsteadiness on feet (R26.81);Muscle weakness (generalized) (M62.81);Difficulty in walking, not elsewhere classified (R26.2);Pain     Time: 1013-1027 PT Time Calculation (min) (ACUTE ONLY): 14 min  Charges:  $Therapeutic Activity: 8-22 mins                     Baxter Flattery, PT  Acute Rehab Dept [facility]) [phone] Pager [phone]  08/01/2019    [facility] 08/01/2019, 1:13 PM

## 2019-08-01 NOTE — Progress Notes (Signed)
Progress Note  Patient Name: Jake [name] Date of Encounter: 08/01/2019  Primary Cardiologist:  No primary care provider on file.  Dr Claudie Leach from [facility] (assigned to him, he has seen [name], [facility])  Subjective   Denies any chest pain or SOB.  Remains in afib with fairly controlled HR  Inpatient Medications    Scheduled Meds: . budesonide (PULMICORT) nebulizer solution  0.5 mg Nebulization BID  . chlorhexidine  15 mL Mouth Rinse BID  . Chlorhexidine Gluconate Cloth  6 each Topical Daily  . enoxaparin (LOVENOX) injection  1 mg/kg Subcutaneous Q12H  . insulin aspart  0-20 Units Subcutaneous Q4H  . ipratropium  0.5 mg Nebulization BID  . levalbuterol  0.63 mg Nebulization BID  . mouth rinse  15 mL Mouth Rinse BID  . sodium chloride flush  10-40 mL Intracatheter Q12H   Continuous Infusions: . diltiazem (CARDIZEM) infusion 15 mg/hr (08/01/19 4650)  . piperacillin-tazobactam (ZOSYN)  IV 3.375 g (08/01/19 0631)  . potassium chloride 10 mEq (08/01/19 0813)  . TPN ADULT (ION) 90 mL/hr at 08/01/19 0300   PRN Meds: guaiFENesin-dextromethorphan, HYDROmorphone (DILAUDID) injection, labetalol, levalbuterol, methocarbamol, ondansetron **OR** ondansetron (ZOFRAN) IV, polyethylene glycol, senna-docusate, sodium chloride flush   Vital Signs    Vitals:   07/31/19 2341 08/01/19 0000 08/01/19 0400 08/01/19 0752  BP:  112/68 124/71   Pulse:  87 72   Resp:  19 20   Temp: 97.7 F (36.5 C)  98.2 F (36.8 C)   TempSrc: Axillary  Oral   SpO2:  92% 91% 94%  Weight:      Height:        Intake/Output Summary (Last 24 hours) at 08/01/2019 0848 Last data filed at 08/01/2019 0600 Gross per 24 hour  Intake 1949.68 ml  Output 850 ml  Net 1099.68 ml   Filed Weights   07/22/19 1812 07/22/19 2330  Weight: 95.3 kg 92.2 kg   Last Weight  Most recent update: 07/22/2019 11:56 PM   Weight  92.2 kg (203 lb 4.2 oz)           Weight change:    Telemetry  afib in 80's with episode  of WCT on tele - Personally Reviewed  ECG    No new EKG to review- Personally Reviewed  Physical Exam  GEN: Well nourished, well developed in no acute distress HEENT: Normal NECK: No JVD; No carotid bruits LYMPHATICS: No lymphadenopathy CARDIAC:irregularly irregular, no murmurs, rubs, gallops RESPIRATORY:  Clear to auscultation without rales, wheezing or rhonchi  ABDOMEN: Soft, non-tender, non-distended MUSCULOSKELETAL:  No edema; No deformity  SKIN: Warm and dry NEUROLOGIC:  Alert and oriented x 3 PSYCHIATRIC:  Normal affect    Labs    Hematology Recent Labs  Lab 07/27/19 0307 07/28/19 0310 07/29/19 0304  WBC 8.2 9.9 8.4  RBC 3.20* 3.27* 3.32*  HGB 11.3* 11.6* 11.7*  HCT 34.3* 34.9* 35.1*  MCV 107.2* 106.7* 105.7*  MCH 35.3* 35.5* 35.2*  MCHC 32.9 33.2 33.3  RDW 13.8 13.9 13.7  PLT 151 157 155    Chemistry Recent Labs  Lab 07/30/19 0220 07/30/19 0220 07/31/19 0500 07/31/19 2138 08/01/19 0232  NA 140   < > 140 137 137  K 3.4*   < > 3.7 3.0* 3.3*  CL 84*   < > 89* 92* 93*  CO2 40*   < > 36* 32 32  GLUCOSE 117*   < > 129* 158* 142*  BUN 40*   < > 39* 39*  40*  CREATININE 1.04   < > 0.93 0.92 0.92  CALCIUM 8.4*   < > 9.3 8.1* 8.2*  PROT 5.9*  --  6.7  --   --   ALBUMIN 2.7*  --  3.0*  --   --   AST 22  --  45*  --   --   ALT 15  --  30  --   --   ALKPHOS 43  --  70  --   --   BILITOT 1.7*  --  1.6*  --   --   GFRNONAA >60   < > >60 >60 >60  GFRAA >60   < > >60 >60 >60  ANIONGAP 16*   < > 15 13 12    < > = values in this interval not displayed.     High Sensitivity Troponin:   Recent Labs  Lab 07/23/19 0300 07/31/19 2138  TROPONINIHS 24* 27*      BNP Recent Labs  Lab 07/27/19 0751 07/29/19 0308  BNP 269.2* 378.9*     DDimer No results for input(s): DDIMER in the last 168 hours.   Radiology    DG Abd 1 View  Result Date: 07/29/2019 CLINICAL DATA:  NG tube placement EXAM: ABDOMEN - 1 VIEW COMPARISON:  07/25/2019 FINDINGS: NG tube remains  in stable position with the tip in the proximal stomach and the side port in the distal esophagus. Multiple dilated loops of small bowel again noted, unchanged. IMPRESSION: NG tube in stable position with the tip in the proximal stomach. Electronically Signed   By: Rolm Baptise M.D.   On: 07/29/2019 11:57   DG Chest Port 1 View  Result Date: 07/29/2019 CLINICAL DATA:  Shortness of breath EXAM: PORTABLE CHEST 1 VIEW COMPARISON:  07/28/2019 FINDINGS: Cardiac shadow is stable. Left-sided PICC line is again noted at the cavoatrial junction. Gastric catheter has been removed in the interval. Improved aeration is noted in the bases bilaterally. No new focal confluent infiltrate is seen. No bony abnormality is noted. IMPRESSION: Improved aeration in the bases.  No acute abnormality noted. Electronically Signed   By: Inez Catalina M.D.   On: 07/29/2019 08:23   DG CHEST PORT 1 VIEW  Result Date: 07/28/2019 CLINICAL DATA:  Central venous catheter placement EXAM: PORTABLE CHEST 1 VIEW COMPARISON:  July 28, 2019 study obtained earlier in the day FINDINGS: Central catheter tip is at the cavoatrial junction. Nasogastric tube tip and side port are in the stomach. No pneumothorax. There is airspace consolidation in the lung bases, more severe on the right than on the left, stable. No new opacity evident. Heart size and pulmonary vascularity are normal. No adenopathy. There is aortic atherosclerosis. No bone lesions. IMPRESSION: Tube and catheter positions as described without pneumothorax. Bibasilar consolidation, more severe on the right than the left, stable. Cardiac silhouette within normal limits. Aortic Atherosclerosis (ICD10-I70.0). Electronically Signed   By: Lowella Grip III M.D.   On: 07/28/2019 19:20   DG CHEST PORT 1 VIEW  Result Date: 07/28/2019 CLINICAL DATA:  Central venous catheter placement EXAM: PORTABLE CHEST 1 VIEW COMPARISON:  07/28/2019 FINDINGS: Single frontal view of the chest demonstrates  left-sided PICC tip overlying superior vena cava. Enteric catheter passes below diaphragm side port projecting over gastric fundus. Tip is excluded by collimation. Cardiac silhouette is stable. There is persistent central vascular congestion with bibasilar consolidation, right greater than left. No effusion or pneumothorax. IMPRESSION: 1. Support devices as above. 2. Persistent central vascular congestion  and bibasilar consolidation, stable. Electronically Signed   By: Randa Ngo M.D.   On: 07/28/2019 16:54     Cardiac Studies   ECHO:  07/22/2019 1. Left ventricular ejection fraction, by estimation, is 55 to 60%. The  left ventricle has normal function. The left ventricle has no regional  wall motion abnormalities. Left ventricular diastolic parameters are  indeterminate.  2. Right ventricular systolic function is normal. The right ventricular  size is mildly enlarged. There is mildly elevated pulmonary artery  systolic pressure. The estimated right ventricular systolic pressure is  83.4 mmHg.  3. The mitral valve is normal in structure. Trivial mitral valve  regurgitation. No evidence of mitral stenosis.  4. The aortic valve is tricuspid. Aortic valve regurgitation is not  visualized. No aortic stenosis is present.  5. The inferior vena cava is normal in size with greater than 50%  respiratory variability, suggesting right atrial pressure of 3 mmHg.  6. The patient was in atrial fibrillation.   Patient Profile     74 y.o. 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  Assessment & Plan    1. SBO - s/p lab chole with enterolysis and decompressive enterotomy with SB anastamosis - surgery following  2. Atrial fib, RVR -HR improved after placing back on Cardizem gtt>>? Poor absorption of PO -HR controlled in the  80's on IV Cardizem which we will continue until his bowel function has improved and we can be assured that he will absorb PO -have been avoiding BB in setting of wheezing -Mr. Bufford could not afford Xarelto and he does not remember discussing Coumadin with his PCP. -he was taking baby aspirin a few times a week. -Currently on full dose Lovenox SQ and recommend changing to Coumadin once ok by surgery- CHA2DS2-VASc is 3 (age x 2, HTN)  3. COPD/exacerbation/Acute respiratory failure with hypoxia - concern for HCAP and likely CHF - getting nebs and antibx - per TRH  4.  HTN -BP controlled at 124/76mmHg -continue IV Cardizem gtt for now -he was on Lisinopril and HCTZ at home currently on hold  5.  Acute diastolic CHF -BNP mildly elevated although Cxray with no edema -likely related to afib with RVR recently -he has put out 78cc UOP yesterday and is net +427 since admit -fluids have been stopped -creatinine stable at 1.04  Cxray with no edema -Continue Lasix PRN  6.  Wide complex tachycardia -this occurred in setting of hypokalemia -need to keep K+>4 and mag > 2  7.  Hypokalemia -K+3 yesterday and 3.3 today -mag 1.9>>need to keep>2 -replete per TRH to keep >4 given ventricular arrhythmias   Signed, Fransico Him, MD 8:48 AM 08/01/2019

## 2019-08-01 NOTE — Progress Notes (Addendum)
PROGRESS NOTE    Jake [name]  PYP:[id number] DOB: 07/20/1945 DOA: 07/22/2019 PCP: Glendon Axe, MD   Brief Narrative:  74 y.o.malewithhistory of hypertension rheumatoid arthritis, AAA without rupture, hepatitis C COPD presented to the ER with complaints of abdominal pain and nausea vomiting. In the ER CT abdomen pelvis shows small bowel obstruction and on-call general surgeon Dr. Harlow Asa was consulted who requested NG tube placement and will be seeing patient in consult.  Initially failed medical management therefore surgical intervention was required of 6/4.  Hospital course also complicated by atrial fibrillation with RVR, cardiology consulted.    Assessment & Plan:   Principal Problem:   SBO (small bowel obstruction) (HCC) Active Problems:   A-fib (HCC)   Atrial fibrillation with rapid ventricular response (HCC)   Acute diastolic CHF (congestive heart failure) ([place])   Essential hypertension  Acute hypoxic respiratory failure/COPD with Hypoxia, down to room air Condom catheter in place.  Antibiotics stopped 6/11. Supplemental oxygen as needed, bronchodilators Xopenex. Ipratropium; IS/Flutter Supplemental oxygen as needed Diuretics as needed  Small bowel obstruction; mechanical Status post ex lap/enterolysis, decompressive enterotomy 6/4 Failed medical management therefore require surgical intervention on 6/4 Diet per general surgery.  If p.o. intake consistent, transition to oral Cardizem and start Coumadin  Atrial fibrillation with RVR, new onset, rate improved Echocardiogram shows EF of 55-60% Currently on IV Cardizem.  Cardiology following. TPN via PICC line.  Lovenox 1 mg/kg every 12 hours.  Eventually p.o. Coumadin Replete mag potassium as needed   Addendum 3pm: Surgery cleared for PO. Start Coumadin with lovenox bridge. Will add PO Cardizem, and slowly wean off Cardizem drip as appropriate.   Essential hypertension Home HCTZ and lisinopril on hold Continue  Cardizem  History of rheumatoid arthritis on methotrexate presently n.p.o.  Mild renal insufficiency, baseline creatinine 1.1 -Creatinine peaked at 1.4.  Today 0.9    DVT prophylaxis: Currently on full dose Lovenox Code Status: Full code Family Communication:None at bedside.  Status is: Inpatient  Remains inpatient appropriate because:IV treatments appropriate due to intensity of illness or inability to take PO   Dispo: The patient is from: Home              Anticipated d/c is to: Home              Anticipated d/c date is: 3 days              Patient currently is not medically stable to d/c.  Awaiting bowel function to return.  General surgery following.  Subjective: Feels okay no complaints.  Abdominal pain slightly improving.  He is passing gas.  Tolerating clears  Review of Systems Otherwise negative except as per HPI, including: General: Denies fever, chills, night sweats or unintended weight loss. Resp: Denies cough, wheezing, shortness of breath. Cardiac: Denies chest pain, palpitations, orthopnea, paroxysmal nocturnal dyspnea. GI: Denies abdominal pain, nausea, vomiting, diarrhea or constipation GU: Denies dysuria, frequency, hesitancy or incontinence MS: Denies muscle aches, joint pain or swelling Neuro: Denies headache, neurologic deficits (focal weakness, numbness, tingling), abnormal gait Psych: Denies anxiety, depression, SI/HI/AVH Skin: Denies new rashes or lesions ID: Denies sick contacts, exotic exposures, travel  Examination: Constitutional: Not in acute distress Respiratory: Bibasilar crackles Cardiovascular: Normal sinus rhythm, no rubs Abdomen: Nontender nondistended good bowel sounds Musculoskeletal: No edema noted Skin: No rashes seen Neurologic: CN 2-12 grossly intact.  And nonfocal Psychiatric: Normal judgment and insight. Alert and oriented x 3. Normal mood.  PICC line noted-left upper extremity External Foley  catheter Surgical scar on the  abdomen noted Objective: Vitals:   07/31/19 2341 08/01/19 0000 08/01/19 0400 08/01/19 0752  BP:  112/68 124/71   Pulse:  87 72   Resp:  19 20   Temp: 97.7 F (36.5 C)  98.2 F (36.8 C)   TempSrc: Axillary  Oral   SpO2:  92% 91% 94%  Weight:      Height:        Intake/Output Summary (Last 24 hours) at 08/01/2019 1025 Last data filed at 08/01/2019 0600 Gross per 24 hour  Intake 1949.68 ml  Output 850 ml  Net 1099.68 ml   Filed Weights   07/22/19 1812 07/22/19 2330  Weight: 95.3 kg 92.2 kg     Data Reviewed:   CBC: Recent Labs  Lab 07/26/19 0542 07/27/19 0307 07/28/19 0310 07/29/19 0304  WBC 7.8 8.2 9.9 8.4  NEUTROABS  --   --   --  7.1  HGB 11.8* 11.3* 11.6* 11.7*  HCT 34.6* 34.3* 34.9* 35.1*  MCV 106.1* 107.2* 106.7* 105.7*  PLT 142* 151 157 175   Basic Metabolic Panel: Recent Labs  Lab 07/29/19 0304 07/29/19 0304 07/29/19 1600 07/30/19 0220 07/31/19 0500 07/31/19 2138 08/01/19 0232  NA 134*   < > 138 140 140 137 137  K 3.2*   < > 3.0* 3.4* 3.7 3.0* 3.3*  CL 88*   < > 87* 84* 89* 92* 93*  CO2 31   < > 40* 40* 36* 32 32  GLUCOSE 133*   < > 147* 117* 129* 158* 142*  BUN 42*   < > 38* 40* 39* 39* 40*  CREATININE 1.11   < > 0.91 1.04 0.93 0.92 0.92  CALCIUM 8.0*   < > 8.2* 8.4* 9.3 8.1* 8.2*  MG 1.7  --   --  2.1 2.4 2.1 1.9  PHOS 3.1  --   --   --  3.4  --  4.0   < > = values in this interval not displayed.   GFR: Estimated Creatinine Clearance: 78.4 mL/min (by C-G formula based on SCr of 0.92 mg/dL). Liver Function Tests: Recent Labs  Lab 07/30/19 0220 07/31/19 0500  AST 22 45*  ALT 15 30  ALKPHOS 43 70  BILITOT 1.7* 1.6*  PROT 5.9* 6.7  ALBUMIN 2.7* 3.0*   No results for input(s): LIPASE, AMYLASE in the last 168 hours. No results for input(s): AMMONIA in the last 168 hours. Coagulation Profile: No results for input(s): INR, PROTIME in the last 168 hours. Cardiac Enzymes: No results for input(s): CKTOTAL, CKMB, CKMBINDEX, TROPONINI in  the last 168 hours. BNP (last 3 results) No results for input(s): PROBNP in the last 8760 hours. HbA1C: No results for input(s): HGBA1C in the last 72 hours. CBG: Recent Labs  Lab 07/31/19 1709 07/31/19 1931 07/31/19 2338 08/01/19 0430 08/01/19 0721  GLUCAP 143* 153* 141* 136* 139*   Lipid Profile: No results for input(s): CHOL, HDL, LDLCALC, TRIG, CHOLHDL, LDLDIRECT in the last 72 hours. Thyroid Function Tests: No results for input(s): TSH, T4TOTAL, FREET4, T3FREE, THYROIDAB in the last 72 hours. Anemia Panel: No results for input(s): VITAMINB12, FOLATE, FERRITIN, TIBC, IRON, RETICCTPCT in the last 72 hours. Sepsis Labs: Recent Labs  Lab 07/28/19 0513  PROCALCITON 0.85    Recent Results (from the past 240 hour(s))  SARS Coronavirus 2 by RT PCR (hospital order, performed in [facility] hospital lab) Nasopharyngeal Nasopharyngeal Swab     Status: None   Collection Time: 07/22/19  6:54 PM   Specimen: Nasopharyngeal Swab  Result Value Ref Range Status   SARS Coronavirus 2 NEGATIVE NEGATIVE Final    Comment: (NOTE) SARS-CoV-2 target nucleic acids are NOT DETECTED. The SARS-CoV-2 RNA is generally detectable in upper and lower respiratory specimens during the acute phase of infection. The lowest concentration of SARS-CoV-2 viral copies this assay can detect is 250 copies / mL. A negative result does not preclude SARS-CoV-2 infection and should not be used as the sole basis for treatment or other patient management decisions.  A negative result may occur with improper specimen collection / handling, submission of specimen other than nasopharyngeal swab, presence of viral mutation(s) within the areas targeted by this assay, and inadequate number of viral copies (<250 copies / mL). A negative result must be combined with clinical observations, patient history, and epidemiological information. Fact Sheet for Patients:   StrictlyIdeas.no Fact Sheet for  Healthcare Providers: BankingDealers.co.za This test is not yet approved or cleared  by the [place] FDA and has been authorized for detection and/or diagnosis of SARS-CoV-2 by FDA under an Emergency Use Authorization (EUA).  This EUA will remain in effect (meaning this test can be used) for the duration of the COVID-19 declaration under Section 564(b)(1) of the Act, 21 U.S.C. section 360bbb-3(b)(1), unless the authorization is terminated or revoked sooner. Performed at [facility], [address] [place], [place] [address]   MRSA PCR Screening     Status: None   Collection Time: 07/22/19 11:25 PM   Specimen: Nasal Mucosa; Nasopharyngeal  Result Value Ref Range Status   MRSA by PCR NEGATIVE NEGATIVE Final    Comment:        The GeneXpert MRSA Assay (FDA approved for NASAL specimens only), is one component of a comprehensive MRSA colonization surveillance program. It is not intended to diagnose MRSA infection nor to guide or monitor treatment for MRSA infections. Performed at [facility], [place] [address], [place], [place] [address]          Radiology Studies: No results found.      Scheduled Meds: . budesonide (PULMICORT) nebulizer solution  0.5 mg Nebulization BID  . chlorhexidine  15 mL Mouth Rinse BID  . Chlorhexidine Gluconate Cloth  6 each Topical Daily  . enoxaparin (LOVENOX) injection  1 mg/kg Subcutaneous Q12H  . insulin aspart  0-20 Units Subcutaneous Q4H  . ipratropium  0.5 mg Nebulization BID  . levalbuterol  0.63 mg Nebulization BID  . mouth rinse  15 mL Mouth Rinse BID  . sodium chloride flush  10-40 mL Intracatheter Q12H   Continuous Infusions: . diltiazem (CARDIZEM) infusion 15 mg/hr (08/01/19 1007)  . piperacillin-tazobactam (ZOSYN)  IV 3.375 g (08/01/19 0631)  . potassium chloride 10 mEq (08/01/19 0923)  . TPN ADULT (ION) 90 mL/hr at 08/01/19 0300  . TPN ADULT (ION)       LOS: 10  days   Time spent= 35 mins    Lakena Sparlin Arsenio Loader, MD [facility]  If 7PM-7AM, please contact night-coverage  08/01/2019, 10:25 AM

## 2019-08-01 NOTE — Progress Notes (Signed)
[facility] Progress Note  7 Days Post-Op  Subjective: CC-  Up in chair. Abdomen sore but feeling ok. Denies any nausea or vomiting since NG tube was removed. Passing a good amount of flatus, no BM. Tolerating clear liquids but states that he does feel a little full after drinking them.  Objective: Vital signs in last 24 hours: Temp:  [97.4 F (36.3 C)-98.7 F (37.1 C)] 98.2 F (36.8 C) (06/11 0400) Pulse Rate:  [35-87] 72 (06/11 0400) Resp:  [19-26] 20 (06/11 0400) BP: (111-126)/(57-83) 124/71 (06/11 0400) SpO2:  [72 %-94 %] 94 % (06/11 0752) Last BM Date: 07/31/19  Intake/Output from previous day: 06/10 0701 - 06/11 0700 In: 3311.5 [I.V.:3101.1; IV Piggyback:210.4] Out: 850 [Urine:850] Intake/Output this shift: No intake/output data recorded.  PE: Gen:  Alert, NAD, pleasant Card:  Irregularly irregular Pulm: rate and effort normal on room air Abd: soft, mild distension, mild upper abdominal TTP, +BS, open midline wound cdi without erythema or purulent drainage/ active bleeding from proximal area >> I used silver nitrate sticks and bleeding stopped, wound packed with gauze and secured with tape  Lab Results:  No results for input(s): WBC, HGB, HCT, PLT in the last 72 hours. BMET Recent Labs    07/31/19 2138 08/01/19 0232  NA 137 137  K 3.0* 3.3*  CL 92* 93*  CO2 32 32  GLUCOSE 158* 142*  BUN 39* 40*  CREATININE 0.92 0.92  CALCIUM 8.1* 8.2*   PT/INR No results for input(s): LABPROT, INR in the last 72 hours. CMP     Component Value Date/Time   NA 137 08/01/2019 0232   K 3.3 (L) 08/01/2019 0232   CL 93 (L) 08/01/2019 0232   CO2 32 08/01/2019 0232   GLUCOSE 142 (H) 08/01/2019 0232   BUN 40 (H) 08/01/2019 0232   CREATININE 0.92 08/01/2019 0232   CALCIUM 8.2 (L) 08/01/2019 0232   PROT 6.7 07/31/2019 0500   ALBUMIN 3.0 (L) 07/31/2019 0500   AST 45 (H) 07/31/2019 0500   ALT 30 07/31/2019 0500   ALKPHOS 70 07/31/2019 0500   BILITOT 1.6 (H)  07/31/2019 0500   GFRNONAA >60 08/01/2019 0232   GFRAA >60 08/01/2019 0232   Lipase     Component Value Date/Time   LIPASE 19 07/22/2019 1854       Studies/Results: No results found.  Anti-infectives: Anti-infectives (From admission, onward)   Start     Dose/Rate Route Frequency Ordered Stop   07/28/19 1800  vancomycin (VANCOCIN) IVPB 1000 mg/200 mL premix  Status:  Discontinued        1,000 mg 200 mL/hr over 60 Minutes Intravenous Every 12 hours 07/28/19 0621 07/30/19 0807   07/28/19 0445  vancomycin (VANCOREADY) IVPB 2000 mg/400 mL        2,000 mg 200 mL/hr over 120 Minutes Intravenous  Once 07/28/19 0439 07/28/19 0824   07/28/19 0445  piperacillin-tazobactam (ZOSYN) IVPB 3.375 g  Status:  Discontinued        3.375 g 12.5 mL/hr over 240 Minutes Intravenous Every 8 hours 07/28/19 0439 08/01/19 1025   07/25/19 0915  ceFAZolin (ANCEF) IVPB 2g/100 mL premix        2 g 200 mL/hr over 30 Minutes Intravenous On call 07/25/19 0908 07/25/19 1307       Assessment/Plan COPD/100+ pack year history-currently@ 1/2 PPD Rheumatoid arthritis Atrial fibrillation - controlled on cardizem gtt, hep gtt Dehydration/acute renal failure - Cr 0.92 PMH RA on methotrexate   Small bowel obstruction with  history of colon resection in 1980s S/p ex lap, enterolysis, decompressive enterotomy and SB anastomosis 6/4 Dr. Hassell Done - POD#7, afebrile, WBC 8 on 6/8 - continues to pass flatus and tolerate clear liquids - advance to full liquids. Continue full rate TPN for now until reliably tolerating PO. Schedule miralax and colace.  - OOB, PT/OT - IS 10x q 1h, flutter valve  - decrease dressing change to once qd, dry-to-dry, need to lightly dampen gauze with saline when removing dressing to try to keep it from bleeding. Silver nitrate sticks at bedside to use if needed for bleeding  FEN: FLD, continue TNA ID: Zosyn 6/7 , started by primary team for potential HCAP VTE: heparin gtt, plan to  transition to coumadin once taking PO GU: condom cath Follow-up: Dr. Hassell Done     LOS: 10 days    Jake [name], [facility] 08/01/2019, 10:50 AM Please see Amion for pager number during day hours 7:00am-4:30pm

## 2019-08-02 LAB — GLUCOSE, CAPILLARY
Glucose-Capillary: 122 mg/dL — ABNORMAL HIGH (ref 70–99)
Glucose-Capillary: 99 mg/dL (ref 70–99)
Glucose-Capillary: 158 mg/dL — ABNORMAL HIGH (ref 70–99)
Glucose-Capillary: 127 mg/dL — ABNORMAL HIGH (ref 70–99)
Glucose-Capillary: 149 mg/dL — ABNORMAL HIGH (ref 70–99)
Glucose-Capillary: 113 mg/dL — ABNORMAL HIGH (ref 70–99)

## 2019-08-02 LAB — CBC
HCT: 32.4 % — ABNORMAL LOW (ref 39.0–52.0)
Hemoglobin: 10.6 g/dL — ABNORMAL LOW (ref 13.0–17.0)
MCH: 35 pg — ABNORMAL HIGH (ref 26.0–34.0)
MCHC: 32.7 g/dL (ref 30.0–36.0)
MCV: 106.9 fL — ABNORMAL HIGH (ref 80.0–100.0)
Platelets: 148 10*3/uL — ABNORMAL LOW (ref 150–400)
RBC: 3.03 MIL/uL — ABNORMAL LOW (ref 4.22–5.81)
RDW: 13.8 % (ref 11.5–15.5)
WBC: 8.8 10*3/uL (ref 4.0–10.5)
nRBC: 0 % (ref 0.0–0.2)

## 2019-08-02 LAB — BASIC METABOLIC PANEL
Anion gap: 7 (ref 5–15)
BUN: 32 mg/dL — ABNORMAL HIGH (ref 8–23)
CO2: 26 mmol/L (ref 22–32)
Calcium: 8.3 mg/dL — ABNORMAL LOW (ref 8.9–10.3)
Chloride: 100 mmol/L (ref 98–111)
Creatinine, Ser: 0.73 mg/dL (ref 0.61–1.24)
GFR calc Af Amer: >60 mL/min (ref >60)
GFR calc non Af Amer: >60 mL/min (ref >60)
Glucose, Bld: 111 mg/dL — ABNORMAL HIGH (ref 70–99)
Potassium: 4.4 mmol/L (ref 3.5–5.1)
Sodium: 133 mmol/L — ABNORMAL LOW (ref 135–145)

## 2019-08-02 LAB — PHOSPHORUS: Phosphorus: 2.8 mg/dL (ref 2.5–4.6)

## 2019-08-02 LAB — MAGNESIUM: Magnesium: 2 mg/dL (ref 1.7–2.4)

## 2019-08-02 MED ORDER — POLYETHYLENE GLYCOL 3350 17 G PO PACK
17.0000 g | PACK | Freq: Two times a day (BID) | ORAL | Status: DC
  Administered 2019-08-02 – 2019-08-06 (×8): 17 g via ORAL
  Filled 2019-08-02 (×8): qty 1

## 2019-08-02 MED ORDER — TRAVASOL 10 % IV SOLN
INTRAVENOUS | Status: DC
  Filled 2019-08-02: qty 572.4

## 2019-08-02 MED ORDER — ENOXAPARIN SODIUM 100 MG/ML ~~LOC~~ SOLN
1.0000 mg/kg | Freq: Two times a day (BID) | SUBCUTANEOUS | Status: DC
  Administered 2019-08-03 – 2019-08-07 (×9): 90 mg via SUBCUTANEOUS
  Filled 2019-08-02 (×3): qty 0.9
  Filled 2019-08-02 (×4): qty 1
  Filled 2019-08-02 (×2): qty 0.9
  Filled 2019-08-02: qty 1

## 2019-08-02 MED ORDER — HYDROCHLOROTHIAZIDE 25 MG PO TABS
25.0000 mg | ORAL_TABLET | Freq: Every day | ORAL | Status: DC
  Administered 2019-08-02 – 2019-08-07 (×6): 25 mg via ORAL
  Filled 2019-08-02 (×6): qty 1

## 2019-08-02 NOTE — Progress Notes (Signed)
Jake [name] [id number] Nov 26, 1945  CARE TEAM:  PCP: Glendon Axe, MD  Outpatient Care Team: Patient Care Team: Glendon Axe, MD as PCP - General (Family Medicine)  Inpatient Treatment Team: Treatment Team: Attending Provider: Damita Lack, MD; Consulting Physician: Edison Pace, Md, MD; Rounding Team: Joycelyn Das, MD; Consulting Physician: Lbcardiology, Michae Kava, MD; Registered Nurse: Carl Best, RN; Utilization Review: Orlean Bradford, RN; Technician: Elie Goody, NT; Registered Nurse: Kathy Breach, RN; Utilization Review: Beau Fanny, RN; Registered Nurse: Camie Patience, RN   Problem List:   Principal Problem:   SBO (small bowel obstruction) [facility]) Active Problems:   A-fib [facility])   Atrial fibrillation with rapid ventricular response ([place])   Acute diastolic CHF (congestive heart failure) ([place])   Essential hypertension   8 Days Post-Op  07/25/2019  Surgeon:         Kaylyn Lim, MD, FACS  Postop Dx:      Complete small bowel obstruction with dense adhesive bands over the pelvic brim  Procedure:      Exploratory laparotomy and enterolysis, decompressive enterotomy (1 liter) and small bowel anastomosis with 6 cm staplers    [facility] Stay = 11 days)    Assessment/Plan COPD/100+ pack year history-currently@ 1/2 PPD Rheumatoid arthritis Atrial fibrillation - controlled on cardizem gtt, hep gtt Dehydration/acute renal failure - Cr 0.92 PMH RA on methotrexate   Small bowel obstruction with history of colon resection in 1980s S/p ex lap, enterolysis, decompressive enterotomy and SB anastomosis 6/4 Dr. Hassell Done - afebrile,  -Gradually advance diet  -See if we can start weaning off TNA to avoid fluid overload.   -MiraLAX bowel regimen.  .  - OOB, PT/OT - IS 10x q 1h, flutter valve  -Switch over to wound VAC.  If no bleeding concerns at incision, can restart anticoagulation in the morning.  Discussed with Dr. Reesa Chew and ICU nursing  FEN:  continue TNA - try to wean ID: Zosyn 6/7 , started by primary team for potential HCAP VTE: Holding anticoagulation x24 hours with incisional wound bleeding.  Most likely can restart full anticoagulation tomorrow.  Defer to medicine.  Most likely subcu Lovenox and then transition to coumadin once taking PO had no bleeding x48 hours GU: condom cath Follow-up: Dr. Hassell Done   20 minutes spent in review, evaluation, examination, counseling, and coordination of care.  More than 50% of that time was spent in counseling.  08/02/2019    Subjective: (Chief complaint)  Bleeding at wound last night.  Lovenox held.  No more bleeding events now.  ICU nursing in room.  Patient tired but not nauseated.  Had flatus with a bowel movement  Objective:  Vital signs:  Vitals:   08/01/19 2000 08/02/19 0000 08/02/19 0547 08/02/19 0600  BP: (!) 103/57 (!) 115/44 126/68 (!) 147/65  Pulse: 84 85  76  Resp: 15 (!) 21  (!) 22  Temp: 98.2 F (36.8 C) 98.6 F (37 C)    TempSrc: Axillary Axillary    SpO2: 98% 96%  95%  Weight:      Height:        Last BM Date: 08/01/19  Intake/Output   Yesterday:  06/11 0701 - 06/12 0700 In: 3455.7 [P.O.:840; I.V.:2615.7] Out: 905 [Urine:905] This shift:  No intake/output data recorded.  Bowel function:  Flatus: YES  BM:  YES  Drain: (No drain)   Physical Exam:  General: Pt awake/alert in mild acute distress Eyes: PERRL, normal EOM.  Sclera clear.  No icterus Neuro:  CN II-XII intact w/o focal sensory/motor deficits. Lymph: No head/neck/groin lymphadenopathy Psych:  No delerium/psychosis/paranoia.  Oriented x 3 HENT: Normocephalic, Mucus membranes moist.  No thrush Neck: Supple, No tracheal deviation.  No obvious thyromegaly Chest: No pain to chest wall compression.  Good respiratory excursion.  No audible wheezing CV:  Pulses intact.  Regular rhythm.  No major extremity edema MS: Normal AROM mjr joints.  No obvious deformity  Abdomen: Soft.   Mildy distended.  Mildly tender at incisions only.  Dressing with old dried blood.  Wound without purulent drainage.  No evidence of peritonitis.  No incarcerated hernias.  Ext:  No deformity.  No mjr edema.  No cyanosis Skin: No petechiae / purpurea.  No major sores.  Warm and dry    Results:   Cultures: Recent Results (from the past 720 hour(s))  SARS Coronavirus 2 by RT PCR (hospital order, performed in [facility] hospital lab) Nasopharyngeal Nasopharyngeal Swab     Status: None   Collection Time: 07/22/19  6:54 PM   Specimen: Nasopharyngeal Swab  Result Value Ref Range Status   SARS Coronavirus 2 NEGATIVE NEGATIVE Final    Comment: (NOTE) SARS-CoV-2 target nucleic acids are NOT DETECTED. The SARS-CoV-2 RNA is generally detectable in upper and lower respiratory specimens during the acute phase of infection. The lowest concentration of SARS-CoV-2 viral copies this assay can detect is 250 copies / mL. A negative result does not preclude SARS-CoV-2 infection and should not be used as the sole basis for treatment or other patient management decisions.  A negative result may occur with improper specimen collection / handling, submission of specimen other than nasopharyngeal swab, presence of viral mutation(s) within the areas targeted by this assay, and inadequate number of viral copies (<250 copies / mL). A negative result must be combined with clinical observations, patient history, and epidemiological information. Fact Sheet for Patients:   StrictlyIdeas.no Fact Sheet for Healthcare Providers: BankingDealers.co.za This test is not yet approved or cleared  by the [place] FDA and has been authorized for detection and/or diagnosis of SARS-CoV-2 by FDA under an Emergency Use Authorization (EUA).  This EUA will remain in effect (meaning this test can be used) for the duration of the COVID-19 declaration under Section 564(b)(1) of  the Act, 21 U.S.C. section 360bbb-3(b)(1), unless the authorization is terminated or revoked sooner. Performed at [facility], [address] [place], [place] [address]   MRSA PCR Screening     Status: None   Collection Time: 07/22/19 11:25 PM   Specimen: Nasal Mucosa; Nasopharyngeal  Result Value Ref Range Status   MRSA by PCR NEGATIVE NEGATIVE Final    Comment:        The GeneXpert MRSA Assay (FDA approved for NASAL specimens only), is one component of a comprehensive MRSA colonization surveillance program. It is not intended to diagnose MRSA infection nor to guide or monitor treatment for MRSA infections. Performed at [facility], [place] [address] [place], [place] [address]     Labs: Results for orders placed or performed during the hospital encounter of 07/22/19 (from the past 48 hour(s))  Glucose, capillary     Status: Abnormal   Collection Time: 07/31/19 11:42 AM  Result Value Ref Range   Glucose-Capillary 151 (H) 70 - 99 mg/dL    Comment: Glucose reference range applies only to samples taken after fasting for at least 8 hours.   Comment 1 Notify RN    Comment 2 Document in  Chart   Glucose, capillary     Status: Abnormal   Collection Time: 07/31/19  5:09 PM  Result Value Ref Range   Glucose-Capillary 143 (H) 70 - 99 mg/dL    Comment: Glucose reference range applies only to samples taken after fasting for at least 8 hours.   Comment 1 Notify RN    Comment 2 Document in Chart   Glucose, capillary     Status: Abnormal   Collection Time: 07/31/19  7:31 PM  Result Value Ref Range   Glucose-Capillary 153 (H) 70 - 99 mg/dL    Comment: Glucose reference range applies only to samples taken after fasting for at least 8 hours.  Magnesium     Status: None   Collection Time: 07/31/19  9:38 PM  Result Value Ref Range   Magnesium 2.1 1.7 - 2.4 mg/dL    Comment: Performed at [facility], [place] [address], [place], [place]  [address]  Basic metabolic panel     Status: Abnormal   Collection Time: 07/31/19  9:38 PM  Result Value Ref Range   Sodium 137 135 - 145 mmol/L   Potassium 3.0 (L) 3.5 - 5.1 mmol/L    Comment: DELTA CHECK NOTED   Chloride 92 (L) 98 - 111 mmol/L   CO2 32 22 - 32 mmol/L   Glucose, Bld 158 (H) 70 - 99 mg/dL    Comment: Glucose reference range applies only to samples taken after fasting for at least 8 hours.   BUN 39 (H) 8 - 23 mg/dL   Creatinine, Ser 0.92 0.61 - 1.24 mg/dL   Calcium 8.1 (L) 8.9 - 10.3 mg/dL   GFR calc non Af Amer >60 >60 mL/min   GFR calc Af Amer >60 >60 mL/min   Anion gap 13 5 - 15    Comment: Performed at [facility], [place] [address], [place], [place] [address]  Troponin I (High Sensitivity)     Status: Abnormal   Collection Time: 07/31/19  9:38 PM  Result Value Ref Range   Troponin I (High Sensitivity) 27 (H) <18 ng/L    Comment: (NOTE) Elevated high sensitivity troponin I (hsTnI) values and significant  changes across serial measurements may suggest ACS but many other  chronic and acute conditions are known to elevate hsTnI results.  Refer to the Links section for chest pain algorithms and additional  guidance. Performed at [facility], [place] [address], [place], [place] [address]   Glucose, capillary     Status: Abnormal   Collection Time: 07/31/19 11:38 PM  Result Value Ref Range   Glucose-Capillary 141 (H) 70 - 99 mg/dL    Comment: Glucose reference range applies only to samples taken after fasting for at least 8 hours.  Basic metabolic panel     Status: Abnormal   Collection Time: 08/01/19  2:32 AM  Result Value Ref Range   Sodium 137 135 - 145 mmol/L   Potassium 3.3 (L) 3.5 - 5.1 mmol/L   Chloride 93 (L) 98 - 111 mmol/L   CO2 32 22 - 32 mmol/L   Glucose, Bld 142 (H) 70 - 99 mg/dL    Comment: Glucose reference range applies only to samples taken after fasting for at least 8 hours.   BUN 40 (H) 8 - 23 mg/dL    Creatinine, Ser 0.92 0.61 - 1.24 mg/dL   Calcium 8.2 (L) 8.9 - 10.3 mg/dL   GFR calc non Af Amer >60 >60 mL/min  GFR calc Af Amer >60 >60 mL/min   Anion gap 12 5 - 15    Comment: Performed at [facility], [place] [address] [place], [place] [address]  Magnesium     Status: None   Collection Time: 08/01/19  2:32 AM  Result Value Ref Range   Magnesium 1.9 1.7 - 2.4 mg/dL    Comment: Performed at [facility], [place] [address], [place], [place] [address]  Phosphorus     Status: None   Collection Time: 08/01/19  2:32 AM  Result Value Ref Range   Phosphorus 4.0 2.5 - 4.6 mg/dL    Comment: Performed at [facility], [place] [address] [place], [place] [address]  Glucose, capillary     Status: Abnormal   Collection Time: 08/01/19  4:30 AM  Result Value Ref Range   Glucose-Capillary 136 (H) 70 - 99 mg/dL    Comment: Glucose reference range applies only to samples taken after fasting for at least 8 hours.  Glucose, capillary     Status: Abnormal   Collection Time: 08/01/19  7:21 AM  Result Value Ref Range   Glucose-Capillary 139 (H) 70 - 99 mg/dL    Comment: Glucose reference range applies only to samples taken after fasting for at least 8 hours.   Comment 1 Notify RN    Comment 2 Document in Chart   Glucose, capillary     Status: Abnormal   Collection Time: 08/01/19 12:31 PM  Result Value Ref Range   Glucose-Capillary 155 (H) 70 - 99 mg/dL    Comment: Glucose reference range applies only to samples taken after fasting for at least 8 hours.  Glucose, capillary     Status: Abnormal   Collection Time: 08/01/19  4:38 PM  Result Value Ref Range   Glucose-Capillary 129 (H) 70 - 99 mg/dL    Comment: Glucose reference range applies only to samples taken after fasting for at least 8 hours.   Comment 1 Notify RN    Comment 2 Document in Chart   Glucose, capillary     Status: Abnormal   Collection Time: 08/01/19  7:25 PM  Result Value Ref  Range   Glucose-Capillary 111 (H) 70 - 99 mg/dL    Comment: Glucose reference range applies only to samples taken after fasting for at least 8 hours.   Comment 1 Notify RN    Comment 2 Document in Chart   Glucose, capillary     Status: Abnormal   Collection Time: 08/01/19 11:40 PM  Result Value Ref Range   Glucose-Capillary 151 (H) 70 - 99 mg/dL    Comment: Glucose reference range applies only to samples taken after fasting for at least 8 hours.  Glucose, capillary     Status: Abnormal   Collection Time: 08/02/19  4:19 AM  Result Value Ref Range   Glucose-Capillary 122 (H) 70 - 99 mg/dL    Comment: Glucose reference range applies only to samples taken after fasting for at least 8 hours.   Comment 1 Notify RN    Comment 2 Document in Chart   Glucose, capillary     Status: None   Collection Time: 08/02/19  7:37 AM  Result Value Ref Range   Glucose-Capillary 99 70 - 99 mg/dL    Comment: Glucose reference range applies only to samples taken after fasting for at least 8 hours.   Comment 1 Document in Chart     Imaging / Studies: No results found.  Medications / Allergies: per chart  Antibiotics: Anti-infectives (From admission, onward)   Start     Dose/Rate Route Frequency Ordered Stop   07/28/19 1800  vancomycin (VANCOCIN) IVPB 1000 mg/200 mL premix  Status:  Discontinued        1,000 mg 200 mL/hr over 60 Minutes Intravenous Every 12 hours 07/28/19 0621 07/30/19 0807   07/28/19 0445  vancomycin (VANCOREADY) IVPB 2000 mg/400 mL        2,000 mg 200 mL/hr over 120 Minutes Intravenous  Once 07/28/19 0439 07/28/19 0824   07/28/19 0445  piperacillin-tazobactam (ZOSYN) IVPB 3.375 g  Status:  Discontinued        3.375 g 12.5 mL/hr over 240 Minutes Intravenous Every 8 hours 07/28/19 0439 08/01/19 1025   07/25/19 0915  ceFAZolin (ANCEF) IVPB 2g/100 mL premix        2 g 200 mL/hr over 30 Minutes Intravenous On call 07/25/19 0908 07/25/19 1307        Note: Portions of this report  may have been transcribed using voice recognition software. Every effort was made to ensure accuracy; however, inadvertent computerized transcription errors may be present.   Any transcriptional errors that result from this process are unintentional.    Adin Hector, MD, FACS, MASCRS Gastrointestinal and Minimally Invasive Surgery  [facility] [address] [place], [place] [address] [phone] Fax [phone] Main/Paging  CONTACT INFORMATION: Weekday (9AM-5PM) concerns: Call [facility] main office at [phone] Weeknight (5PM-9AM) or Weekend/Holiday concerns: Check [email or web address] for General Surgery [facility] coverage (Please, do not use SecureChat as it is not reliable communication to operating surgeons for immediate patient care)      08/02/2019  7:53 AM

## 2019-08-02 NOTE — Progress Notes (Signed)
[place] for LMWH + warfarin Indication: atrial fibrillation  Allergies  Allergen Reactions  . Propoxyphene Nausea And Vomiting    Patient Measurements: Height: 6\' 1"  (185.4 cm) Weight: 92.2 kg (203 lb 4.2 oz) IBW/kg (Calculated) : 79.9  Vital Signs: Temp: 98.6 F (37 C) (06/12 0800) Temp Source: Oral (06/12 0800) BP: 129/57 (06/12 0804) Pulse Rate: 88 (06/12 0804)  Labs: Recent Labs    07/31/19 2138 08/01/19 0232 08/02/19 0737  HGB  --   --  10.6*  HCT  --   --  32.4*  PLT  --   --  148*  CREATININE 0.92 0.92 0.73  TROPONINIHS 27*  --   --     Estimated Creatinine Clearance: 90.2 mL/min (by C-G formula based on SCr of 0.73 mg/dL).   Medications:  Medications Prior to Admission  Medication Sig Dispense Refill Last Dose  . aspirin 81 MG tablet Take 81 mg by mouth every other day.    Past Week at Unknown time  . folic acid (FOLVITE) 1 MG tablet Take 1 mg by mouth daily.   Past Week at Unknown time  . gabapentin (NEURONTIN) 300 MG capsule Take 300 mg by mouth 2 (two) times daily. Reported on 06/03/2015   Past Week at Unknown time  . hydrochlorothiazide (HYDRODIURIL) 25 MG tablet Take 25 mg by mouth daily. Reported on 06/03/2015   Past Week at Unknown time  . lisinopril (PRINIVIL,ZESTRIL) 10 MG tablet Take 10 mg by mouth daily. Reported on 06/03/2015   Past Week at Unknown time  . magnesium oxide (MAG-OX) 400 MG tablet Take 400 mg by mouth daily.    Past Week at Unknown time  . methotrexate 2.5 MG tablet Take 12.5 mg by mouth once a week. tuesdays   Past Week at Unknown time  . VENTOLIN HFA 108 (90 Base) MCG/ACT inhaler Inhale 1-2 puffs into the lungs every 4 (four) hours as needed.    Past Week at Unknown time  . cephALEXin (KEFLEX) 500 MG capsule Take 1 capsule (500 mg total) by mouth 3 (three) times daily. 21 capsule 0   . chlorpheniramine-HYDROcodone (TUSSIONEX PENNKINETIC ER) 10-8 MG/5ML SUER Take 5 mLs by mouth every 12 (twelve)  hours as needed for cough. 140 mL 0   . HYDROcodone-acetaminophen (NORCO/VICODIN) 5-325 MG tablet Take 1 tablet by mouth every 6 (six) hours as needed. 10 tablet 0   . levofloxacin (LEVAQUIN) 500 MG tablet Take 1 tablet (500 mg total) by mouth daily. 7 tablet 0   . predniSONE (DELTASONE) 20 MG tablet Take 2 tablets (40 mg total) by mouth daily. 10 tablet 0   . sildenafil (REVATIO) 20 MG tablet Reported on 06/03/2015  10    Scheduled:  . budesonide (PULMICORT) nebulizer solution  0.5 mg Nebulization BID  . chlorhexidine  15 mL Mouth Rinse BID  . Chlorhexidine Gluconate Cloth  6 each Topical Daily  . diltiazem  60 mg Oral Q8H  . [START ON 08/03/2019] enoxaparin (LOVENOX) injection  1 mg/kg Subcutaneous Q12H  . insulin aspart  0-20 Units Subcutaneous Q4H  . ipratropium  0.5 mg Nebulization BID  . levalbuterol  0.63 mg Nebulization BID  . mouth rinse  15 mL Mouth Rinse BID  . polyethylene glycol  17 g Oral BID  . sodium chloride flush  10-40 mL Intracatheter Q12H  . Warfarin - Pharmacist Dosing Inpatient   Does not apply q1600   Infusions:  . TPN ADULT (ION) 90 mL/hr at  08/02/19 0300  . TPN ADULT (ION)      Assessment: 81 yoM with PAF on ASA only (d/t inability to afford Xarelto), admitted 6/1 for SBO. Subsequently went into AFib w/ RVR and heparin drip started 6/2. Has since been converted to LMWH q12 and now to add warfarin.   Baseline INR wnl; aPTT not done  Prior anticoagulation: ASA >> UFH gtt >> LMWH 1 mg/kg q12 hr  Significant events:  Today, 08/02/2019:  CBC: hgb low & slowly decreasing; Plt stable at low end of goal range  Per Surgery PA note, midline wound with active bleeding from proximal area this AM, treated with AgNO and packed with gauze;   Major drug interactions: none  Ordered full liquid diet; intake not charted  SCr stable WNL  Goal of Therapy: Heparin level 0.6-1.0 units/ml 4 hr after dose INR 2-3 Monitor platelets by anticoagulation protocol:  Yes  Plan:  Surgery instructed to hold today's doses Lovenox and warfarin d/t bleeding at surgical site  F/u ability to resume Lovenox and warfarin tomorrow  Daily INR; CBC q72 hr  Monitor for signs of bleeding or thrombosis  Netta Cedars, PharmD, BCPS 08/02/2019, 8:51 AM

## 2019-08-02 NOTE — Progress Notes (Signed)
Progress Note  Patient Name: Jake [name] Date of Encounter: 08/02/2019  Primary Cardiologist:  No primary care provider on file.  Dr Claudie Leach from [facility] (assigned to him, he has seen [name], [facility])  Subjective   Doing well having full breakfast   Inpatient Medications    Scheduled Meds: . budesonide (PULMICORT) nebulizer solution  0.5 mg Nebulization BID  . chlorhexidine  15 mL Mouth Rinse BID  . Chlorhexidine Gluconate Cloth  6 each Topical Daily  . diltiazem  60 mg Oral Q8H  . [START ON 08/03/2019] enoxaparin (LOVENOX) injection  1 mg/kg Subcutaneous Q12H  . insulin aspart  0-20 Units Subcutaneous Q4H  . ipratropium  0.5 mg Nebulization BID  . levalbuterol  0.63 mg Nebulization BID  . mouth rinse  15 mL Mouth Rinse BID  . polyethylene glycol  17 g Oral BID  . sodium chloride flush  10-40 mL Intracatheter Q12H  . Warfarin - Pharmacist Dosing Inpatient   Does not apply q1600   Continuous Infusions: . TPN ADULT (ION) 90 mL/hr at 08/02/19 0300  . TPN ADULT (ION)     PRN Meds: guaiFENesin-dextromethorphan, HYDROmorphone (DILAUDID) injection, labetalol, levalbuterol, methocarbamol, ondansetron **OR** ondansetron (ZOFRAN) IV, silver nitrate applicators, sodium chloride flush   Vital Signs    Vitals:   08/02/19 0800 08/02/19 0804 08/02/19 0808 08/02/19 0900  BP:  (!) 129/57  (!) 140/59  Pulse: (!) 102 88  (!) 102  Resp: 16 (!) 22  (!) 25  Temp: 98.6 F (37 C)     TempSrc: Oral     SpO2: 95% 93% 93% 93%  Weight:      Height:        Intake/Output Summary (Last 24 hours) at 08/02/2019 1011 Last data filed at 08/02/2019 0600 Gross per 24 hour  Intake 2855.73 ml  Output 680 ml  Net 2175.73 ml   Filed Weights   07/22/19 1812 07/22/19 2330  Weight: 95.3 kg 92.2 kg   Last Weight  Most recent update: 07/22/2019 11:56 PM   Weight  92.2 kg (203 lb 4.2 oz)           Weight change:    Telemetry  afib rates 90-100 bpm - Personally Reviewed  ECG    No  new EKG to review- Personally Reviewed  Physical Exam  Affect appropriate Healthy:  appears stated age HEENT: normal Neck supple with no adenopathy JVP normal no bruits no thyromegaly Lungs COPD exp wheezing  Heart:  S1/S2 no murmur, no rub, gallop or click PMI normal Abdomen: benighn, BS positve, no tenderness, no AAA no bruit.  No HSM or HJR Distal pulses intact with no bruits No edema Neuro non-focal Skin warm and dry No muscular weakness    Labs    Hematology Recent Labs  Lab 07/28/19 0310 07/29/19 0304 08/02/19 0737  WBC 9.9 8.4 8.8  RBC 3.27* 3.32* 3.03*  HGB 11.6* 11.7* 10.6*  HCT 34.9* 35.1* 32.4*  MCV 106.7* 105.7* 106.9*  MCH 35.5* 35.2* 35.0*  MCHC 33.2 33.3 32.7  RDW 13.9 13.7 13.8  PLT 157 155 148*    Chemistry Recent Labs  Lab 07/30/19 0220 07/30/19 0220 07/31/19 0500 07/31/19 0500 07/31/19 2138 08/01/19 0232 08/02/19 0737  NA 140   < > 140   < > 137 137 133*  K 3.4*   < > 3.7   < > 3.0* 3.3* 4.4  CL 84*   < > 89*   < > 92* 93* 100  CO2 40*   < > 36*   < > 32 32 26  GLUCOSE 117*   < > 129*   < > 158* 142* 111*  BUN 40*   < > 39*   < > 39* 40* 32*  CREATININE 1.04   < > 0.93   < > 0.92 0.92 0.73  CALCIUM 8.4*   < > 9.3   < > 8.1* 8.2* 8.3*  PROT 5.9*  --  6.7  --   --   --   --   ALBUMIN 2.7*  --  3.0*  --   --   --   --   AST 22  --  45*  --   --   --   --   ALT 15  --  30  --   --   --   --   ALKPHOS 43  --  70  --   --   --   --   BILITOT 1.7*  --  1.6*  --   --   --   --   GFRNONAA >60   < > >60   < > >60 >60 >60  GFRAA >60   < > >60   < > >60 >60 >60  ANIONGAP 16*   < > 15   < > 13 12 7    < > = values in this interval not displayed.     High Sensitivity Troponin:   Recent Labs  Lab 07/23/19 0300 07/31/19 2138  TROPONINIHS 24* 27*      BNP Recent Labs  Lab 07/27/19 0751 07/29/19 0308  BNP 269.2* 378.9*     DDimer No results for input(s): DDIMER in the last 168 hours.   Radiology    DG Abd 1 View  Result Date:  07/29/2019 CLINICAL DATA:  NG tube placement EXAM: ABDOMEN - 1 VIEW COMPARISON:  07/25/2019 FINDINGS: NG tube remains in stable position with the tip in the proximal stomach and the side port in the distal esophagus. Multiple dilated loops of small bowel again noted, unchanged. IMPRESSION: NG tube in stable position with the tip in the proximal stomach. Electronically Signed   By: Rolm Baptise M.D.   On: 07/29/2019 11:57     Cardiac Studies   ECHO:  07/22/2019 1. Left ventricular ejection fraction, by estimation, is 55 to 60%. The  left ventricle has normal function. The left ventricle has no regional  wall motion abnormalities. Left ventricular diastolic parameters are  indeterminate.  2. Right ventricular systolic function is normal. The right ventricular  size is mildly enlarged. There is mildly elevated pulmonary artery  systolic pressure. The estimated right ventricular systolic pressure is  31.5 mmHg.  3. The mitral valve is normal in structure. Trivial mitral valve  regurgitation. No evidence of mitral stenosis.  4. The aortic valve is tricuspid. Aortic valve regurgitation is not  visualized. No aortic stenosis is present.  5. The inferior vena cava is normal in size with greater than 50%  respiratory variability, suggesting right atrial pressure of 3 mmHg.  6. The patient was in atrial fibrillation.   Patient Profile     74 y.o. 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  Assessment & Plan    1. SBO - NG out taking  PO no plans for surgery   2. Atrial fib, RVR - HR controlled on oral cardizem Coumadin started ok with surgery pharmacy dosing   3. COPD/exacerbation/Acute respiratory failure with hypoxia - continue to have wheezing  - concern for HCAP and likely CHF - getting nebs and antibx - per TRH  4.   HTN -was on lisinopril 10 mg at home but now cardizem for afib see below  5.  Acute diastolic CHF - will add back home dose HCTC 25 mg daily as BP also up    Signed, Jenkins Rouge, MD 10:11 AM 08/02/2019

## 2019-08-02 NOTE — Progress Notes (Signed)
PHARMACY - TOTAL PARENTERAL NUTRITION CONSULT NOTE   Indication: bowel obstruction   Patient Measurements: Height: 6\' 1"  (185.4 cm) Weight: 92.2 kg (203 lb 4.2 oz) IBW/kg (Calculated) : 79.9 TPN AdjBW (KG): 92.2 Body mass index is 26.82 kg/m. Usual Weight: 100 kg in 12/2018  Assessment: 74 yo male presented on 07/22/2019 with abdominal pain, nausea, vomiting found to have SBO. Patient reports appetite "came and went" prior to admission and reports last food consumed 2 days prior to admission. Patient has been NPO since admission and underwent exploratory lap with enterolysis, decompressive enterotomy, and small bowel anastomosis on 6/4.   Per RN, patient ~30% or possibly less of clear liquid diet and no nausea or abdominal pain reported this morning.   Glucose / Insulin: No hx of DM. A1c 5.2. CBGs 99-155 - at goal mainly. On rSSI q4h. 17 units/24 hours Electrolytes: Na 133-decreasing. Other electrolytes wnl.  Renal: Scr 0.73 - stable.  LFTs / TGs: AST 45 - increase (6/10). Tbili 1.6 - improved (6/10). TG 107 (6/8).  Prealbumin / albumin: Prealbumin 6.0 (6/8). Albumin 3.0 (6/10).  Intake / Output; MIVF: 1300 ml/24 hrs NG output on 6/9 and NG pulled out by pt and not replaced on 6/10.  GI Imaging: 6/1 CT: mid to distal SBO 6/2 x-ray: persistent SBO 6/3 x-ray: interval improvement in dilated small bowel loops Surgeries / Procedures:  6/4 ex lap, enterolysis, decompressive enterotomy and SB anastomosis   Central access: PICC placed 6/7 TPN start date: 6/7  Nutritional Goals (RD recommendation on 6/8): kCal: 2100-2305, Protein: 105-120g, Fluid: >2L Goal TPN rate is 90 mL/hr (provides 114g of protein and 2230 kcals per day)  Current Nutrition:  Clear liquids started on 6/10; increased to full liquids on 6/11 TPN started on 6/7   Plan:  Begin to wean TPN today- decrease rate to 38ml/hr with today's bag @ 6pm Adjusted electrolytes in TPN: Ca 5 mEq/L, Mg increased to 8 mEq/L, K  increased to 55 mEq/L, Na increased to 150 mEq/L Continued electrolytes in TPN:  Phos 15 mmol/L, Cl:Ac ratio max chloride  Add standard MVI and trace elements to TPN Continue resistant q4h SSI and adjust as needed  Monitor TPN labs on Mon/Thurs, recheck electrolytes tomorrow   Netta Cedars, PharmD, BCPS 08/02/2019,8:32 AM

## 2019-08-02 NOTE — Progress Notes (Signed)
PROGRESS NOTE    Jake [name]  VOZ:[id number] DOB: Sep 26, 1945 DOA: 07/22/2019 PCP: Glendon Axe, MD   Brief Narrative:  74 y.o.malewithhistory of hypertension rheumatoid arthritis, AAA without rupture, hepatitis C COPD presented to the ER with complaints of abdominal pain and nausea vomiting. In the ER CT abdomen pelvis shows small bowel obstruction and on-call general surgeon Dr. Harlow Asa was consulted who requested NG tube placement and will be seeing patient in consult.  Initially failed medical management therefore surgical intervention was required of 6/4.  Hospital course also complicated by atrial fibrillation with RVR, cardiology consulted.  Patient was started on TPN via PICC line initially slowly transition to oral.  Lovenox switch back to Coumadin.    Assessment & Plan:   Principal Problem:   SBO (small bowel obstruction) (HCC) Active Problems:   A-fib (HCC)   Atrial fibrillation with rapid ventricular response (HCC)   Acute diastolic CHF (congestive heart failure) ([name])   Essential hypertension  Acute hypoxic respiratory failure/COPD with Hypoxia, down to room air Condom catheter in place, slowly improving. Supplemental oxygen as needed, bronchodilators Xopenex and ipratropium. Aggressive use of incentive spirometer and flutter valve Wean off oxygen, diuretics as needed  Small bowel obstruction; mechanical Status post ex lap/enterolysis, decompressive enterotomy 6/4 Failed medical management therefore require surgical intervention on 6/4.  Some bleeding around surgical site dressing therefore holding off on anticoagulation today Diet per general surgery.  Slowly advance diet Currently supplemented by PICC/TPN  Atrial fibrillation with RVR, new onset, rate improved Echocardiogram shows EF of 55-60% Cardiology following.  Tolerating p.o. Cardizem.  If remains off Cardizem drip for 24 hours he can be transitioned to telemetry Transition Lovenox to Coumadin-on hold today  due to bleeding around surgical site.  Resume tomorrow   Essential hypertension Home HCTZ and lisinopril on hold Continue Cardizem  History of rheumatoid arthritis on methotrexate presently n.p.o.  Mild renal insufficiency, baseline creatinine 1.1 -Creatinine peaked at 1.4.  Currently stable    DVT prophylaxis: Coumadin bridge with Lovenox-currently on hold due to concerns of bleeding around surgical site Code Status: Full code Family Communication: Son at bedside Status is: Inpatient  Remains inpatient appropriate because:IV treatments appropriate due to intensity of illness or inability to take PO   Dispo: The patient is from: Home              Anticipated d/c is to: Home              Anticipated d/c date is: 3 days              Patient currently is not medically stable to d/c.  Awaiting bowel function to return.  General surgery following.  Subjective: Feels okay no complaints remains in A. fib but rate controlled.  Had some bleeding around surgical site yesterday therefore anticoagulation on hold.  Had formed bowel movement this morning  Review of Systems Otherwise negative except as per HPI, including: General: Denies fever, chills, night sweats or unintended weight loss. Resp: Denies cough, wheezing, shortness of breath. Cardiac: Denies chest pain, palpitations, orthopnea, paroxysmal nocturnal dyspnea. GI: Denies abdominal pain, nausea, vomiting, diarrhea or constipation GU: Denies dysuria, frequency, hesitancy or incontinence MS: Denies muscle aches, joint pain or swelling Neuro: Denies headache, neurologic deficits (focal weakness, numbness, tingling), abnormal gait Psych: Denies anxiety, depression, SI/HI/AVH Skin: Denies new rashes or lesions ID: Denies sick contacts, exotic exposures, travel  Examination: Constitutional: Not in acute distress Respiratory: Mild bibasilar crackles Cardiovascular: Irregularly irregular-controlled Abdomen: Nontender  nondistended  good bowel sounds Musculoskeletal: No edema noted Skin: No rashes seen Neurologic: CN 2-12 grossly intact.  And nonfocal Psychiatric: Normal judgment and insight. Alert and oriented x 3. Normal mood.  PICC line noted-left upper extremity External Foley catheter Surgical scar on the abdomen noted Objective: Vitals:   08/01/19 2000 08/02/19 0000 08/02/19 0547 08/02/19 0600  BP: (!) 103/57 (!) 115/44 126/68 (!) 147/65  Pulse: 84 85  76  Resp: 15 (!) 21  (!) 22  Temp: 98.2 F (36.8 C) 98.6 F (37 C)    TempSrc: Axillary Axillary    SpO2: 98% 96%  95%  Weight:      Height:        Intake/Output Summary (Last 24 hours) at 08/02/2019 0730 Last data filed at 08/02/2019 0600 Gross per 24 hour  Intake 3455.73 ml  Output 905 ml  Net 2550.73 ml   Filed Weights   07/22/19 1812 07/22/19 2330  Weight: 95.3 kg 92.2 kg     Data Reviewed:   CBC: Recent Labs  Lab 07/27/19 0307 07/28/19 0310 07/29/19 0304  WBC 8.2 9.9 8.4  NEUTROABS  --   --  7.1  HGB 11.3* 11.6* 11.7*  HCT 34.3* 34.9* 35.1*  MCV 107.2* 106.7* 105.7*  PLT 151 157 161   Basic Metabolic Panel: Recent Labs  Lab 07/29/19 0304 07/29/19 0304 07/29/19 1600 07/30/19 0220 07/31/19 0500 07/31/19 2138 08/01/19 0232  NA 134*   < > 138 140 140 137 137  K 3.2*   < > 3.0* 3.4* 3.7 3.0* 3.3*  CL 88*   < > 87* 84* 89* 92* 93*  CO2 31   < > 40* 40* 36* 32 32  GLUCOSE 133*   < > 147* 117* 129* 158* 142*  BUN 42*   < > 38* 40* 39* 39* 40*  CREATININE 1.11   < > 0.91 1.04 0.93 0.92 0.92  CALCIUM 8.0*   < > 8.2* 8.4* 9.3 8.1* 8.2*  MG 1.7  --   --  2.1 2.4 2.1 1.9  PHOS 3.1  --   --   --  3.4  --  4.0   < > = values in this interval not displayed.   GFR: Estimated Creatinine Clearance: 78.4 mL/min (by C-G formula based on SCr of 0.92 mg/dL). Liver Function Tests: Recent Labs  Lab 07/30/19 0220 07/31/19 0500  AST 22 45*  ALT 15 30  ALKPHOS 43 70  BILITOT 1.7* 1.6*  PROT 5.9* 6.7  ALBUMIN 2.7* 3.0*    No results for input(s): LIPASE, AMYLASE in the last 168 hours. No results for input(s): AMMONIA in the last 168 hours. Coagulation Profile: No results for input(s): INR, PROTIME in the last 168 hours. Cardiac Enzymes: No results for input(s): CKTOTAL, CKMB, CKMBINDEX, TROPONINI in the last 168 hours. BNP (last 3 results) No results for input(s): PROBNP in the last 8760 hours. HbA1C: No results for input(s): HGBA1C in the last 72 hours. CBG: Recent Labs  Lab 08/01/19 1231 08/01/19 1638 08/01/19 1925 08/01/19 2340 08/02/19 0419  GLUCAP 155* 129* 111* 151* 122*   Lipid Profile: No results for input(s): CHOL, HDL, LDLCALC, TRIG, CHOLHDL, LDLDIRECT in the last 72 hours. Thyroid Function Tests: No results for input(s): TSH, T4TOTAL, FREET4, T3FREE, THYROIDAB in the last 72 hours. Anemia Panel: No results for input(s): VITAMINB12, FOLATE, FERRITIN, TIBC, IRON, RETICCTPCT in the last 72 hours. Sepsis Labs: Recent Labs  Lab 07/28/19 0513  PROCALCITON 0.85    No results found  for this or any previous visit (from the past 240 hour(s)).       Radiology Studies: No results found.      Scheduled Meds: . budesonide (PULMICORT) nebulizer solution  0.5 mg Nebulization BID  . chlorhexidine  15 mL Mouth Rinse BID  . Chlorhexidine Gluconate Cloth  6 each Topical Daily  . diltiazem  60 mg Oral Q8H  . docusate sodium  100 mg Oral BID  . enoxaparin (LOVENOX) injection  1 mg/kg Subcutaneous Q12H  . insulin aspart  0-20 Units Subcutaneous Q4H  . ipratropium  0.5 mg Nebulization BID  . levalbuterol  0.63 mg Nebulization BID  . mouth rinse  15 mL Mouth Rinse BID  . polyethylene glycol  17 g Oral Daily  . sodium chloride flush  10-40 mL Intracatheter Q12H  . Warfarin - Pharmacist Dosing Inpatient   Does not apply q1600   Continuous Infusions: . TPN ADULT (ION) 90 mL/hr at 08/02/19 0300     LOS: 11 days   Time spent= 35 mins    Dayveon Halley Arsenio Loader, MD [facility]  If 7PM-7AM, please contact night-coverage  08/02/2019, 7:30 AM

## 2019-08-03 DIAGNOSIS — I482 Chronic atrial fibrillation, unspecified: Secondary | ICD-10-CM

## 2019-08-03 LAB — MAGNESIUM: Magnesium: 1.8 mg/dL (ref 1.7–2.4)

## 2019-08-03 LAB — GLUCOSE, CAPILLARY
Glucose-Capillary: 127 mg/dL — ABNORMAL HIGH (ref 70–99)
Glucose-Capillary: 114 mg/dL — ABNORMAL HIGH (ref 70–99)
Glucose-Capillary: 134 mg/dL — ABNORMAL HIGH (ref 70–99)
Glucose-Capillary: 133 mg/dL — ABNORMAL HIGH (ref 70–99)
Glucose-Capillary: 101 mg/dL — ABNORMAL HIGH (ref 70–99)

## 2019-08-03 LAB — BASIC METABOLIC PANEL
Anion gap: 8 (ref 5–15)
BUN: 29 mg/dL — ABNORMAL HIGH (ref 8–23)
CO2: 24 mmol/L (ref 22–32)
Calcium: 8.4 mg/dL — ABNORMAL LOW (ref 8.9–10.3)
Chloride: 102 mmol/L (ref 98–111)
Creatinine, Ser: 0.74 mg/dL (ref 0.61–1.24)
GFR calc Af Amer: >60 mL/min (ref >60)
GFR calc non Af Amer: >60 mL/min (ref >60)
Glucose, Bld: 131 mg/dL — ABNORMAL HIGH (ref 70–99)
Potassium: 4.9 mmol/L (ref 3.5–5.1)
Sodium: 134 mmol/L — ABNORMAL LOW (ref 135–145)

## 2019-08-03 LAB — CBC
HCT: 32.6 % — ABNORMAL LOW (ref 39.0–52.0)
Hemoglobin: 10.7 g/dL — ABNORMAL LOW (ref 13.0–17.0)
MCH: 35.4 pg — ABNORMAL HIGH (ref 26.0–34.0)
MCHC: 32.8 g/dL (ref 30.0–36.0)
MCV: 107.9 fL — ABNORMAL HIGH (ref 80.0–100.0)
Platelets: 176 10*3/uL (ref 150–400)
RBC: 3.02 MIL/uL — ABNORMAL LOW (ref 4.22–5.81)
RDW: 13.9 % (ref 11.5–15.5)
WBC: 11.3 10*3/uL — ABNORMAL HIGH (ref 4.0–10.5)
nRBC: 0 % (ref 0.0–0.2)

## 2019-08-03 MED ORDER — COUMADIN BOOK
Freq: Once | Status: AC
  Filled 2019-08-03 (×2): qty 1

## 2019-08-03 MED ORDER — WARFARIN SODIUM 5 MG PO TABS
5.0000 mg | ORAL_TABLET | Freq: Once | ORAL | Status: AC
  Administered 2019-08-03: 5 mg via ORAL
  Filled 2019-08-03: qty 1

## 2019-08-03 MED ORDER — ENSURE SURGERY PO LIQD: 237.0000 mL | Freq: Two times a day (BID) | ORAL | Status: DC

## 2019-08-03 MED ORDER — ENSURE SURGERY PO LIQD
237.0000 mL | Freq: Two times a day (BID) | ORAL | Status: DC
  Administered 2019-08-03 – 2019-08-05 (×2): 237 mL via ORAL
  Filled 2019-08-03 (×6): qty 237

## 2019-08-03 MED ORDER — IPRATROPIUM BROMIDE 0.02 % IN SOLN: 0.5000 mg | Freq: Four times a day (QID) | RESPIRATORY_TRACT | Status: DC | PRN

## 2019-08-03 NOTE — Progress Notes (Signed)
[place] for LMWH + warfarin Indication: atrial fibrillation  Allergies  Allergen Reactions  . Propoxyphene Nausea And Vomiting    Patient Measurements: Height: 6\' 1"  (185.4 cm) Weight: 92.2 kg (203 lb 4.2 oz) IBW/kg (Calculated) : 79.9  Vital Signs: Temp: 97.9 F (36.6 C) (06/13 0700) Temp Source: Oral (06/13 0700) BP: 122/72 (06/13 1000) Pulse Rate: 63 (06/13 1000)  Labs: Recent Labs    07/31/19 2138 07/31/19 2138 08/01/19 0232 08/02/19 0737 08/03/19 0438  HGB  --   --   --  10.6* 10.7*  HCT  --   --   --  32.4* 32.6*  PLT  --   --   --  148* 176  CREATININE 0.92   < > 0.92 0.73 0.74  TROPONINIHS 27*  --   --   --   --    < > = values in this interval not displayed.    Estimated Creatinine Clearance: 90.2 mL/min (by C-G formula based on SCr of 0.74 mg/dL).   Medications:  Medications Prior to Admission  Medication Sig Dispense Refill Last Dose  . aspirin 81 MG tablet Take 81 mg by mouth every other day.    Past Week at Unknown time  . folic acid (FOLVITE) 1 MG tablet Take 1 mg by mouth daily.   Past Week at Unknown time  . gabapentin (NEURONTIN) 300 MG capsule Take 300 mg by mouth 2 (two) times daily. Reported on 06/03/2015   Past Week at Unknown time  . hydrochlorothiazide (HYDRODIURIL) 25 MG tablet Take 25 mg by mouth daily. Reported on 06/03/2015   Past Week at Unknown time  . lisinopril (PRINIVIL,ZESTRIL) 10 MG tablet Take 10 mg by mouth daily. Reported on 06/03/2015   Past Week at Unknown time  . magnesium oxide (MAG-OX) 400 MG tablet Take 400 mg by mouth daily.    Past Week at Unknown time  . methotrexate 2.5 MG tablet Take 12.5 mg by mouth once a week. tuesdays   Past Week at Unknown time  . VENTOLIN HFA 108 (90 Base) MCG/ACT inhaler Inhale 1-2 puffs into the lungs every 4 (four) hours as needed.    Past Week at Unknown time  . cephALEXin (KEFLEX) 500 MG capsule Take 1 capsule (500 mg total) by mouth 3 (three) times  daily. 21 capsule 0   . chlorpheniramine-HYDROcodone (TUSSIONEX PENNKINETIC ER) 10-8 MG/5ML SUER Take 5 mLs by mouth every 12 (twelve) hours as needed for cough. 140 mL 0   . HYDROcodone-acetaminophen (NORCO/VICODIN) 5-325 MG tablet Take 1 tablet by mouth every 6 (six) hours as needed. 10 tablet 0   . levofloxacin (LEVAQUIN) 500 MG tablet Take 1 tablet (500 mg total) by mouth daily. 7 tablet 0   . predniSONE (DELTASONE) 20 MG tablet Take 2 tablets (40 mg total) by mouth daily. 10 tablet 0   . sildenafil (REVATIO) 20 MG tablet Reported on 06/03/2015  10    Scheduled:  . budesonide (PULMICORT) nebulizer solution  0.5 mg Nebulization BID  . chlorhexidine  15 mL Mouth Rinse BID  . Chlorhexidine Gluconate Cloth  6 each Topical Daily  . diltiazem  60 mg Oral Q8H  . enoxaparin (LOVENOX) injection  1 mg/kg Subcutaneous Q12H  . feeding supplement  237 mL Oral BID BM  . hydrochlorothiazide  25 mg Oral Daily  . insulin aspart  0-20 Units Subcutaneous Q4H  . mouth rinse  15 mL Mouth Rinse BID  . polyethylene glycol  17  g Oral BID  . sodium chloride flush  10-40 mL Intracatheter Q12H  . Warfarin - Pharmacist Dosing Inpatient   Does not apply q1600   Infusions:  . TPN ADULT (ION) 45 mL/hr at 08/02/19 1815    Assessment: 27 yoM with PAF on ASA only (d/t inability to afford Xarelto), admitted 6/1 for SBO. Subsequently went into AFib w/ RVR and heparin drip started 6/2. Has since been converted to LMWH q12 and now to add warfarin.   Baseline INR wnl; aPTT not done  Prior anticoagulation: ASA >> UFH gtt >> LMWH 1 mg/kg q12 hr  Significant events: 6/11: Lovenox held due to bleeding at wound VAC 6/13: ok to resume Lovenox/warfarin per surgery  Today, 08/03/2019:  CBC: hgb low but stable; Plt WNL  Per RN, small amount of oozing at Wound VAC but much improved from yesterday   Major drug interactions: none  Ordered cardiac diet- tolerating 100%  SCr stable WNL  Goal of Therapy: Heparin level  0.6-1.0 units/ml 4 hr after dose INR 2-3 Monitor platelets by anticoagulation protocol: Yes  Plan:  Resume Lovenox 90mg  sq q12h until INR therapeutic  Warfarin 5mg  po x1 today  Daily INR; CBC q72 hr  Monitor for signs of bleeding or thrombosis  Initiate warfarin education  Netta Cedars, PharmD, BCPS 08/03/2019, 11:14 AM

## 2019-08-03 NOTE — Progress Notes (Signed)
Jake [name] [id number] 01/26/1946  CARE TEAM:  PCP: Glendon Axe, MD  Outpatient Care Team: Patient Care Team: Glendon Axe, MD as PCP - General (Family Medicine)  Inpatient Treatment Team: Treatment Team: Attending Provider: Blain Pais, MD; Consulting Physician: Edison Pace, Md, MD; Rounding Team: Joycelyn Das, MD; Consulting Physician: Lbcardiology, Michae Kava, MD; Registered Nurse: Carl Best, RN; Utilization Review: Orlean Bradford, RN; Registered Nurse: Camie Patience, RN   Problem List:   Principal Problem:   SBO (small bowel obstruction) [facility]) Active Problems:   A-fib [facility])   Atrial fibrillation with rapid ventricular response ([place])   Acute diastolic CHF (congestive heart failure) ([place])   Essential hypertension   9 Days Post-Op  07/25/2019  Surgeon:         Kaylyn Lim, MD, FACS  Postop Dx:      Complete small bowel obstruction with dense adhesive bands over the pelvic brim  Procedure:      Exploratory laparotomy and enterolysis, decompressive enterotomy (1 liter) and small bowel anastomosis with 6 cm staplers    [facility] Stay = 12 days)    Assessment/Plan COPD/100+ pack year history-currently@ 1/2 PPD Rheumatoid arthritis Atrial fibrillation - controlled on cardizem gtt, hep gtt Dehydration/acute renal failure - Cr 0.92 PMH RA on methotrexate   Small bowel obstruction with history of colon resection in 1980s S/p ex lap, enterolysis, decompressive enterotomy and SB anastomosis 6/4 Dr. Hassell Done - afebrile,  -Solid diet.    Try supplemental shakes, etc. -cal counts -Start weaning off TNA to avoid fluid overload.   -MiraLAX bowel regimen.  .  - OOB, PT/OT - IS 10x q 1h, flutter valve  -Switch over to wound VAC.  If no bleeding concerns at incision, can restart anticoagulation in the morning.  Discussed with Dr. Reesa Chew and ICU nursing  FEN: continue TNA - try to wean ID: Zosyn 6/7 , started by primary team for potential HCAP VTE: No  more bleeding.  Okay to go to full anticoagulation.  Seen by cardiology [place] ordered.  Lovenox bridge with warfarin.   GU: condom cath Follow-up: Dr. Hassell Done   20 minutes spent in review, evaluation, examination, counseling, and coordination of care.  More than 50% of that time was spent in counseling.  08/03/2019    Subjective: (Chief complaint)  Tired but denies much pain.  Tolerating diet better.  No more bleeding events with wound VAC in place.  Objective:  Vital signs:  Vitals:   08/03/19 0700 08/03/19 0800 08/03/19 0900 08/03/19 0907  BP: (!) 140/55 139/79 118/69   Pulse: (!) 54 86 75   Resp: 19 (!) 24 14   Temp: 97.9 F (36.6 C)     TempSrc: Oral     SpO2: 93% 95% 95% 95%  Weight:      Height:        Last BM Date: 08/03/19  Intake/Output   Yesterday:  06/12 0701 - 06/13 0700 In: 1328.2 [P.O.:240; I.V.:1088.2] Out: 750 [Urine:750] This shift:  Total I/O In: -  Out: 250 [Urine:250]  Bowel function:  Flatus: YES  BM:  YES  Drain: (No drain)   Physical Exam:  General: Pt awake/alert in no acute distress Eyes: PERRL, normal EOM.  Sclera clear.  No icterus Neuro: CN II-XII intact w/o focal sensory/motor deficits. Lymph: No head/neck/groin lymphadenopathy Psych:  No delerium/psychosis/paranoia.  Oriented x 3 HENT: Normocephalic, Mucus membranes moist.  No thrush Neck: Supple, No tracheal deviation.  No obvious thyromegaly Chest: No pain to chest wall  compression.  Good respiratory excursion.  No audible wheezing CV:  Pulses intact.  Regular rhythm.  No major extremity edema MS: Normal AROM mjr joints.  No obvious deformity  Abdomen: Obese Soft.  Mildy distended.  Mildly tender at incisions only.  Dressing with old dried blood.  Wound without purulent drainage.  No evidence of peritonitis.  No incarcerated hernias.  Ext:  No deformity.  No mjr edema.  No cyanosis Skin: No petechiae / purpurea.  No major sores.  Warm and dry    Results:    Cultures: Recent Results (from the past 720 hour(s))  SARS Coronavirus 2 by RT PCR (hospital order, performed in [facility] hospital lab) Nasopharyngeal Nasopharyngeal Swab     Status: None   Collection Time: 07/22/19  6:54 PM   Specimen: Nasopharyngeal Swab  Result Value Ref Range Status   SARS Coronavirus 2 NEGATIVE NEGATIVE Final    Comment: (NOTE) SARS-CoV-2 target nucleic acids are NOT DETECTED. The SARS-CoV-2 RNA is generally detectable in upper and lower respiratory specimens during the acute phase of infection. The lowest concentration of SARS-CoV-2 viral copies this assay can detect is 250 copies / mL. A negative result does not preclude SARS-CoV-2 infection and should not be used as the sole basis for treatment or other patient management decisions.  A negative result may occur with improper specimen collection / handling, submission of specimen other than nasopharyngeal swab, presence of viral mutation(s) within the areas targeted by this assay, and inadequate number of viral copies (<250 copies / mL). A negative result must be combined with clinical observations, patient history, and epidemiological information. Fact Sheet for Patients:   StrictlyIdeas.no Fact Sheet for Healthcare Providers: BankingDealers.co.za This test is not yet approved or cleared  by the [place] FDA and has been authorized for detection and/or diagnosis of SARS-CoV-2 by FDA under an Emergency Use Authorization (EUA).  This EUA will remain in effect (meaning this test can be used) for the duration of the COVID-19 declaration under Section 564(b)(1) of the Act, 21 U.S.C. section 360bbb-3(b)(1), unless the authorization is terminated or revoked sooner. Performed at [facility], [address] [place], [place] [address]   MRSA PCR Screening     Status: None   Collection Time: 07/22/19 11:25 PM   Specimen: Nasal Mucosa;  Nasopharyngeal  Result Value Ref Range Status   MRSA by PCR NEGATIVE NEGATIVE Final    Comment:        The GeneXpert MRSA Assay (FDA approved for NASAL specimens only), is one component of a comprehensive MRSA colonization surveillance program. It is not intended to diagnose MRSA infection nor to guide or monitor treatment for MRSA infections. Performed at [facility], [place] [address], [place], [place] [address]     Labs: Results for orders placed or performed during the hospital encounter of 07/22/19 (from the past 48 hour(s))  Glucose, capillary     Status: Abnormal   Collection Time: 08/01/19 12:31 PM  Result Value Ref Range   Glucose-Capillary 155 (H) 70 - 99 mg/dL    Comment: Glucose reference range applies only to samples taken after fasting for at least 8 hours.  Glucose, capillary     Status: Abnormal   Collection Time: 08/01/19  4:38 PM  Result Value Ref Range   Glucose-Capillary 129 (H) 70 - 99 mg/dL    Comment: Glucose reference range applies only to samples taken after fasting for at least 8 hours.   Comment 1  Notify RN    Comment 2 Document in Chart   Glucose, capillary     Status: Abnormal   Collection Time: 08/01/19  7:25 PM  Result Value Ref Range   Glucose-Capillary 111 (H) 70 - 99 mg/dL    Comment: Glucose reference range applies only to samples taken after fasting for at least 8 hours.   Comment 1 Notify RN    Comment 2 Document in Chart   Glucose, capillary     Status: Abnormal   Collection Time: 08/01/19 11:40 PM  Result Value Ref Range   Glucose-Capillary 151 (H) 70 - 99 mg/dL    Comment: Glucose reference range applies only to samples taken after fasting for at least 8 hours.  Glucose, capillary     Status: Abnormal   Collection Time: 08/02/19  4:19 AM  Result Value Ref Range   Glucose-Capillary 122 (H) 70 - 99 mg/dL    Comment: Glucose reference range applies only to samples taken after fasting for at least 8 hours.   Comment  1 Notify RN    Comment 2 Document in Chart   Basic metabolic panel     Status: Abnormal   Collection Time: 08/02/19  7:37 AM  Result Value Ref Range   Sodium 133 (L) 135 - 145 mmol/L   Potassium 4.4 3.5 - 5.1 mmol/L    Comment: DELTA CHECK NOTED NO VISIBLE HEMOLYSIS    Chloride 100 98 - 111 mmol/L   CO2 26 22 - 32 mmol/L   Glucose, Bld 111 (H) 70 - 99 mg/dL    Comment: Glucose reference range applies only to samples taken after fasting for at least 8 hours.   BUN 32 (H) 8 - 23 mg/dL   Creatinine, Ser 0.73 0.61 - 1.24 mg/dL   Calcium 8.3 (L) 8.9 - 10.3 mg/dL   GFR calc non Af Amer >60 >60 mL/min   GFR calc Af Amer >60 >60 mL/min   Anion gap 7 5 - 15    Comment: Performed at [facility], [place] [address] [place], [place] [address]  CBC     Status: Abnormal   Collection Time: 08/02/19  7:37 AM  Result Value Ref Range   WBC 8.8 4.0 - 10.5 K/uL   RBC 3.03 (L) 4.22 - 5.81 MIL/uL   Hemoglobin 10.6 (L) 13.0 - 17.0 g/dL   HCT 32.4 (L) 39 - 52 %   MCV 106.9 (H) 80.0 - 100.0 fL   MCH 35.0 (H) 26.0 - 34.0 pg   MCHC 32.7 30.0 - 36.0 g/dL   RDW 13.8 11.5 - 15.5 %   Platelets 148 (L) 150 - 400 K/uL   nRBC 0.0 0.0 - 0.2 %    Comment: Performed at [facility], [place] [address] [place], [place] [address]  Magnesium     Status: None   Collection Time: 08/02/19  7:37 AM  Result Value Ref Range   Magnesium 2.0 1.7 - 2.4 mg/dL    Comment: Performed at [facility], [place] [address], [place], [place] [address]  Phosphorus     Status: None   Collection Time: 08/02/19  7:37 AM  Result Value Ref Range   Phosphorus 2.8 2.5 - 4.6 mg/dL    Comment: Performed at [facility], [place] [address] [place], [place] [address]  Glucose, capillary     Status: None   Collection Time: 08/02/19  7:37 AM  Result Value Ref Range   Glucose-Capillary 99  70 - 99 mg/dL    Comment: Glucose reference range applies only to samples taken after  fasting for at least 8 hours.   Comment 1 Document in Chart   Glucose, capillary     Status: Abnormal   Collection Time: 08/02/19 11:46 AM  Result Value Ref Range   Glucose-Capillary 158 (H) 70 - 99 mg/dL    Comment: Glucose reference range applies only to samples taken after fasting for at least 8 hours.   Comment 1 Notify RN    Comment 2 Document in Chart   Glucose, capillary     Status: Abnormal   Collection Time: 08/02/19  3:53 PM  Result Value Ref Range   Glucose-Capillary 127 (H) 70 - 99 mg/dL    Comment: Glucose reference range applies only to samples taken after fasting for at least 8 hours.   Comment 1 Document in Chart   Glucose, capillary     Status: Abnormal   Collection Time: 08/02/19  7:15 PM  Result Value Ref Range   Glucose-Capillary 149 (H) 70 - 99 mg/dL    Comment: Glucose reference range applies only to samples taken after fasting for at least 8 hours.   Comment 1 Notify RN    Comment 2 Document in Chart   Glucose, capillary     Status: Abnormal   Collection Time: 08/02/19 11:04 PM  Result Value Ref Range   Glucose-Capillary 113 (H) 70 - 99 mg/dL    Comment: Glucose reference range applies only to samples taken after fasting for at least 8 hours.   Comment 1 Notify RN    Comment 2 Document in Chart   Glucose, capillary     Status: Abnormal   Collection Time: 08/03/19  4:15 AM  Result Value Ref Range   Glucose-Capillary 127 (H) 70 - 99 mg/dL    Comment: Glucose reference range applies only to samples taken after fasting for at least 8 hours.   Comment 1 Notify RN    Comment 2 Document in Chart   Basic metabolic panel     Status: Abnormal   Collection Time: 08/03/19  4:38 AM  Result Value Ref Range   Sodium 134 (L) 135 - 145 mmol/L   Potassium 4.9 3.5 - 5.1 mmol/L   Chloride 102 98 - 111 mmol/L   CO2 24 22 - 32 mmol/L   Glucose, Bld 131 (H) 70 - 99 mg/dL    Comment: Glucose reference range applies only to samples taken after fasting for at least 8 hours.    BUN 29 (H) 8 - 23 mg/dL   Creatinine, Ser 0.74 0.61 - 1.24 mg/dL   Calcium 8.4 (L) 8.9 - 10.3 mg/dL   GFR calc non Af Amer >60 >60 mL/min   GFR calc Af Amer >60 >60 mL/min   Anion gap 8 5 - 15    Comment: Performed at [facility], [place] [address] [place], [place] [address]  CBC     Status: Abnormal   Collection Time: 08/03/19  4:38 AM  Result Value Ref Range   WBC 11.3 (H) 4.0 - 10.5 K/uL   RBC 3.02 (L) 4.22 - 5.81 MIL/uL   Hemoglobin 10.7 (L) 13.0 - 17.0 g/dL   HCT 32.6 (L) 39 - 52 %   MCV 107.9 (H) 80.0 - 100.0 fL   MCH 35.4 (H) 26.0 - 34.0 pg   MCHC 32.8 30.0 - 36.0 g/dL   RDW 13.9 11.5 - 15.5 %   Platelets 176  150 - 400 K/uL   nRBC 0.0 0.0 - 0.2 %    Comment: Performed at [facility], [place] [address] [place], [place] [address]  Magnesium     Status: None   Collection Time: 08/03/19  4:38 AM  Result Value Ref Range   Magnesium 1.8 1.7 - 2.4 mg/dL    Comment: Performed at [facility], [place] [address], [place], [place] [address]  Glucose, capillary     Status: Abnormal   Collection Time: 08/03/19  7:35 AM  Result Value Ref Range   Glucose-Capillary 114 (H) 70 - 99 mg/dL    Comment: Glucose reference range applies only to samples taken after fasting for at least 8 hours.    Imaging / Studies: No results found.  Medications / Allergies: per chart  Antibiotics: Anti-infectives (From admission, onward)   Start     Dose/Rate Route Frequency Ordered Stop   07/28/19 1800  vancomycin (VANCOCIN) IVPB 1000 mg/200 mL premix  Status:  Discontinued        1,000 mg 200 mL/hr over 60 Minutes Intravenous Every 12 hours 07/28/19 0621 07/30/19 0807   07/28/19 0445  vancomycin (VANCOREADY) IVPB 2000 mg/400 mL        2,000 mg 200 mL/hr over 120 Minutes Intravenous  Once 07/28/19 0439 07/28/19 0824   07/28/19 0445  piperacillin-tazobactam (ZOSYN) IVPB 3.375 g  Status:  Discontinued        3.375 g 12.5 mL/hr over 240 Minutes  Intravenous Every 8 hours 07/28/19 0439 08/01/19 1025   07/25/19 0915  ceFAZolin (ANCEF) IVPB 2g/100 mL premix        2 g 200 mL/hr over 30 Minutes Intravenous On call 07/25/19 0908 07/25/19 1307        Note: Portions of this report may have been transcribed using voice recognition software. Every effort was made to ensure accuracy; however, inadvertent computerized transcription errors may be present.   Any transcriptional errors that result from this process are unintentional.    Adin Hector, MD, FACS, MASCRS Gastrointestinal and Minimally Invasive Surgery  [facility] [address], [place], [place] [address] [phone] Fax [phone] Main/Paging  CONTACT INFORMATION: Weekday (9AM-5PM) concerns: Call [facility] main office at [phone] Weeknight (5PM-9AM) or Weekend/Holiday concerns: Check [email or web address] for General Surgery [facility] coverage (Please, do not use SecureChat as it is not reliable communication to operating surgeons for immediate patient care)      08/03/2019  10:41 AM

## 2019-08-03 NOTE — Progress Notes (Signed)
PHARMACY - TOTAL PARENTERAL NUTRITION CONSULT NOTE   Indication: bowel obstruction   Patient Measurements: Height: 6\' 1"  (185.4 cm) Weight: 92.2 kg (203 lb 4.2 oz) IBW/kg (Calculated) : 79.9 TPN AdjBW (KG): 92.2 Body mass index is 26.82 kg/m. Usual Weight: 100 kg in 12/2018  Assessment: 74 yo male presented on 07/22/2019 with abdominal pain, nausea, vomiting found to have SBO. Patient reports appetite "came and went" prior to admission and reports last food consumed 2 days prior to admission. Patient has been NPO since admission and underwent exploratory lap with enterolysis, decompressive enterotomy, and small bowel anastomosis on 6/4.   Glucose / Insulin: No hx of DM. A1c 5.2. CBGs 113-127 - at goal. On rSSI q4h. 9 units/24 hours Electrolytes: Na 134-increasing. Other electrolytes wnl.  Renal: Scr 0.74 - stable.  LFTs / TGs: AST 45 - increase (6/10). Tbili 1.6 - improved (6/10). TG 107 (6/8).  Prealbumin / albumin: Prealbumin 6.0 (6/8). Albumin 3.0 (6/10).  Intake / Output; MIVF: 1300 ml/24 hrs NG output on 6/9 and NG pulled out by pt and not replaced on 6/10.  GI Imaging: 6/1 CT: mid to distal SBO 6/2 x-ray: persistent SBO 6/3 x-ray: interval improvement in dilated small bowel loops Surgeries / Procedures:  6/4 ex lap, enterolysis, decompressive enterotomy and SB anastomosis   Central access: PICC placed 6/7 TPN start date: 6/7  Nutritional Goals (RD recommendation on 6/8): kCal: 2100-2305, Protein: 105-120g, Fluid: >2L Goal TPN rate is 90 mL/hr (provides 114g of protein and 2230 kcals per day)  Current Nutrition:  Cardiac diet- Per RN, patient eating 100% of cardiac diet and no nausea or abdominal pain reported this morning. TPN started on 6/7   Plan:  Stop TPN today. D/C SSI and TPN labs Check CBGs for 24h to ensure patient not hypoglycemic off TPN  Netta Cedars, PharmD, BCPS 08/03/2019,11:29 AM

## 2019-08-03 NOTE — Progress Notes (Signed)
Progress Note  Patient Name: Jake [name] Date of Encounter: 08/03/2019  Primary Cardiologist:  No primary care provider on file.  Dr Claudie Leach from [facility] (assigned to him, he has seen [name], [facility])  Subjective   Doing well having full breakfast does not note afib   Inpatient Medications    Scheduled Meds: . budesonide (PULMICORT) nebulizer solution  0.5 mg Nebulization BID  . chlorhexidine  15 mL Mouth Rinse BID  . Chlorhexidine Gluconate Cloth  6 each Topical Daily  . diltiazem  60 mg Oral Q8H  . enoxaparin (LOVENOX) injection  1 mg/kg Subcutaneous Q12H  . hydrochlorothiazide  25 mg Oral Daily  . insulin aspart  0-20 Units Subcutaneous Q4H  . mouth rinse  15 mL Mouth Rinse BID  . polyethylene glycol  17 g Oral BID  . sodium chloride flush  10-40 mL Intracatheter Q12H  . Warfarin - Pharmacist Dosing Inpatient   Does not apply q1600   Continuous Infusions: . TPN ADULT (ION) 45 mL/hr at 08/02/19 1815   PRN Meds: guaiFENesin-dextromethorphan, HYDROmorphone (DILAUDID) injection, ipratropium, labetalol, levalbuterol, methocarbamol, ondansetron **OR** ondansetron (ZOFRAN) IV, silver nitrate applicators, sodium chloride flush   Vital Signs    Vitals:   08/03/19 0400 08/03/19 0500 08/03/19 0600 08/03/19 0700  BP: 133/68 134/68 133/67 (!) 140/55  Pulse: 82 90 77 (!) 54  Resp: (!) 23 (!) 23 14 19   Temp: 97.6 F (36.4 C)   97.9 F (36.6 C)  TempSrc: Oral   Oral  SpO2: 94% 93% 95% 93%  Weight:      Height:        Intake/Output Summary (Last 24 hours) at 08/03/2019 4010 Last data filed at 08/03/2019 0400 Gross per 24 hour  Intake 1328.2 ml  Output 750 ml  Net 578.2 ml   Filed Weights   07/22/19 1812 07/22/19 2330  Weight: 95.3 kg 92.2 kg   Last Weight  Most recent update: 07/22/2019 11:56 PM   Weight  92.2 kg (203 lb 4.2 oz)           Weight change:    Telemetry  afib rates 90-100 bpm - Personally Reviewed  ECG    No new EKG to review-  Personally Reviewed  Physical Exam  Affect appropriate Healthy:  appears stated age HEENT: normal Neck supple with no adenopathy JVP normal no bruits no thyromegaly Lungs COPD exp wheezing  Heart:  S1/S2 no murmur, no rub, gallop or click PMI normal Abdomen: benighn, BS positve, no tenderness, no AAA no bruit.  No HSM or HJR Distal pulses intact with no bruits No edema Neuro non-focal Skin warm and dry No muscular weakness    Labs    Hematology Recent Labs  Lab 07/29/19 0304 08/02/19 0737 08/03/19 0438  WBC 8.4 8.8 11.3*  RBC 3.32* 3.03* 3.02*  HGB 11.7* 10.6* 10.7*  HCT 35.1* 32.4* 32.6*  MCV 105.7* 106.9* 107.9*  MCH 35.2* 35.0* 35.4*  MCHC 33.3 32.7 32.8  RDW 13.7 13.8 13.9  PLT 155 148* 176    Chemistry Recent Labs  Lab 07/30/19 0220 07/30/19 0220 07/31/19 0500 07/31/19 2138 08/01/19 0232 08/02/19 0737 08/03/19 0438  NA 140   < > 140   < > 137 133* 134*  K 3.4*   < > 3.7   < > 3.3* 4.4 4.9  CL 84*   < > 89*   < > 93* 100 102  CO2 40*   < > 36*   < > 32  26 24  GLUCOSE 117*   < > 129*   < > 142* 111* 131*  BUN 40*   < > 39*   < > 40* 32* 29*  CREATININE 1.04   < > 0.93   < > 0.92 0.73 0.74  CALCIUM 8.4*   < > 9.3   < > 8.2* 8.3* 8.4*  PROT 5.9*  --  6.7  --   --   --   --   ALBUMIN 2.7*  --  3.0*  --   --   --   --   AST 22  --  45*  --   --   --   --   ALT 15  --  30  --   --   --   --   ALKPHOS 43  --  70  --   --   --   --   BILITOT 1.7*  --  1.6*  --   --   --   --   GFRNONAA >60   < > >60   < > >60 >60 >60  GFRAA >60   < > >60   < > >60 >60 >60  ANIONGAP 16*   < > 15   < > 12 7 8    < > = values in this interval not displayed.     High Sensitivity Troponin:   Recent Labs  Lab 07/23/19 0300 07/31/19 2138  TROPONINIHS 24* 27*      BNP Recent Labs  Lab 07/29/19 0308  BNP 378.9*     DDimer No results for input(s): DDIMER in the last 168 hours.   Radiology    No results found.   Cardiac Studies   ECHO:  07/22/2019 1.  Left ventricular ejection fraction, by estimation, is 55 to 60%. The  left ventricle has normal function. The left ventricle has no regional  wall motion abnormalities. Left ventricular diastolic parameters are  indeterminate.  2. Right ventricular systolic function is normal. The right ventricular  size is mildly enlarged. There is mildly elevated pulmonary artery  systolic pressure. The estimated right ventricular systolic pressure is  32.2 mmHg.  3. The mitral valve is normal in structure. Trivial mitral valve  regurgitation. No evidence of mitral stenosis.  4. The aortic valve is tricuspid. Aortic valve regurgitation is not  visualized. No aortic stenosis is present.  5. The inferior vena cava is normal in size with greater than 50%  respiratory variability, suggesting right atrial pressure of 3 mmHg.  6. The patient was in atrial fibrillation.   Patient Profile     74 y.o. 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  Assessment & Plan    1. SBO - NG out taking PO no plans for surgery   2. Atrial fib, RVR - HR controlled on oral cardizem Coumadin started ok with surgery pharmacy dosing will need INR check in am  Would change to LA cardizem prior to d/c   3. COPD/exacerbation/Acute respiratory failure with hypoxia - continue to have wheezing  - concern for HCAP and likely CHF - getting nebs and antibx - per TRH  4.  HTN -was on lisinopril 10 mg at home but now cardizem for afib see below  5.  Acute diastolic CHF - will  add back home dose HCTC 25 mg daily as BP also up    Signed, Jenkins Rouge, MD 8:12 AM 08/03/2019

## 2019-08-03 NOTE — Progress Notes (Signed)
PROGRESS NOTE    Jake [name]  JYN:[id number] DOB: 1945/12/29 DOA: 07/22/2019 PCP: Glendon Axe, MD   Brief Narrative:  75 y.o.malewithhistory of hypertension rheumatoid arthritis, AAA without rupture, hepatitis C COPD presented to the ER with complaints of abdominal pain and nausea vomiting. In the ER CT abdomen pelvis shows small bowel obstruction and on-call general surgeon Dr. Harlow Asa was consulted who requested NG tube placement and will be seeing patient in consult.  Initially failed medical management therefore surgical intervention was required of 6/4.  Hospital course also complicated by atrial fibrillation with RVR, cardiology consulted.  Patient was started on TPN via PICC line initially slowly transition to oral.  Lovenox switch back to Coumadin. Slow recovery so far.     Assessment & Plan:   Principal Problem:   SBO (small bowel obstruction) (HCC) Active Problems:   A-fib (HCC)   Atrial fibrillation with rapid ventricular response (HCC)   Acute diastolic CHF (congestive heart failure) ([place])   Essential hypertension  Acute hypoxic respiratory failure/COPD with Hypoxia, down to room air Condom catheter in place, slowly improving. Supplemental oxygen as needed, bronchodilators Xopenex and ipratropium. Aggressive use of incentive spirometer and flutter valve Wean off oxygen, continue diuretics as needed  Small bowel obstruction; mechanical Status post ex lap/enterolysis, decompressive enterotomy 6/4 Failed medical management therefore require surgical intervention on 6/4.  Some bleeding around surgical site dressing therefore holding off on anticoagulation today Diet per general surgery.  Slowly advance diet Currently supplemented by PICC/TPN, dietitian following.   Atrial fibrillation with RVR, new onset, rate improved Echocardiogram shows EF of 55-60% Cardiology following.  Tolerating p.o. Cardizem.  Transition Lovenox to Coumadin-Lovenox on hold until 6/12 due to  bleeding around surgical site.  Resumed today. Monitor for signs of bleeding.    Essential hypertension Home HCTZ and lisinopril on hold Continue Cardizem  History of rheumatoid arthritis on methotrexate presently n.p.o.  Mild renal insufficiency, baseline creatinine 1.1 -Creatinine peaked at 1.4.  Currently stable    DVT prophylaxis: Coumadin bridge with Lovenox-currently on hold due to concerns of bleeding around surgical site Code Status: Full code Family Communication: None at the bedside.  Status is: Inpatient.   May be stable to transfer to telemetry if HR remains stable off cardizem drip.   Remains inpatient appropriate because:IV treatments appropriate due to intensity of illness or inability to take PO   Dispo: The patient is from: Home              Anticipated d/c is to: Home              Anticipated d/c date is: 3 days              Patient currently is not medically stable to d/c.  Awaiting bowel function to return.  General surgery following.  Subjective: Reports feeling tired. Denies chest pain. Reports that his abdominal pain is well controlled.   Review of Systems No nausea or vomiting.   Examination: Constitutional: Not in acute distress, resting in bed Respiratory: no wheezing, no respiratory distress Cardiovascular: Irregularly irregular, normal rate Abdomen: Nontender nondistended Musculoskeletal: No edema noted Skin: No rashes seen Neurologic: No facial droop, following commands Psychiatric: Appropriate mood and affect  PICC line noted-left upper extremity External Foley catheter Surgical scar on the abdomen noted Objective: Vitals:   08/03/19 1300 08/03/19 1400 08/03/19 1500 08/03/19 1600  BP: 118/69 (!) 142/124 122/66 (!) 130/54  Pulse:  87 (!) 111 80  Resp: 19 (!) 21 (!) 23 (!)  22  Temp:      TempSrc:      SpO2:  97% 94% 96%  Weight:      Height:        Intake/Output Summary (Last 24 hours) at 08/03/2019 1728 Last data filed at  08/03/2019 1700 Gross per 24 hour  Intake 1045.74 ml  Output 1050 ml  Net -4.26 ml   Filed Weights   07/22/19 1812 07/22/19 2330  Weight: 95.3 kg 92.2 kg     Data Reviewed:   CBC: Recent Labs  Lab 07/28/19 0310 07/29/19 0304 08/02/19 0737 08/03/19 0438  WBC 9.9 8.4 8.8 11.3*  NEUTROABS  --  7.1  --   --   HGB 11.6* 11.7* 10.6* 10.7*  HCT 34.9* 35.1* 32.4* 32.6*  MCV 106.7* 105.7* 106.9* 107.9*  PLT 157 155 148* 938   Basic Metabolic Panel: Recent Labs  Lab 07/29/19 0304 07/29/19 1600 07/31/19 0500 07/31/19 2138 08/01/19 0232 08/02/19 0737 08/03/19 0438  NA 134*   < > 140 137 137 133* 134*  K 3.2*   < > 3.7 3.0* 3.3* 4.4 4.9  CL 88*   < > 89* 92* 93* 100 102  CO2 31   < > 36* 32 32 26 24  GLUCOSE 133*   < > 129* 158* 142* 111* 131*  BUN 42*   < > 39* 39* 40* 32* 29*  CREATININE 1.11   < > 0.93 0.92 0.92 0.73 0.74  CALCIUM 8.0*   < > 9.3 8.1* 8.2* 8.3* 8.4*  MG 1.7   < > 2.4 2.1 1.9 2.0 1.8  PHOS 3.1  --  3.4  --  4.0 2.8  --    < > = values in this interval not displayed.   GFR: Estimated Creatinine Clearance: 90.2 mL/min (by C-G formula based on SCr of 0.74 mg/dL). Liver Function Tests: Recent Labs  Lab 07/30/19 0220 07/31/19 0500  AST 22 45*  ALT 15 30  ALKPHOS 43 70  BILITOT 1.7* 1.6*  PROT 5.9* 6.7  ALBUMIN 2.7* 3.0*   No results for input(s): LIPASE, AMYLASE in the last 168 hours. No results for input(s): AMMONIA in the last 168 hours. Coagulation Profile: No results for input(s): INR, PROTIME in the last 168 hours. Cardiac Enzymes: No results for input(s): CKTOTAL, CKMB, CKMBINDEX, TROPONINI in the last 168 hours. BNP (last 3 results) No results for input(s): PROBNP in the last 8760 hours. HbA1C: No results for input(s): HGBA1C in the last 72 hours. CBG: Recent Labs  Lab 08/02/19 1915 08/02/19 2304 08/03/19 0415 08/03/19 0735 08/03/19 1557  GLUCAP 149* 113* 127* 114* 134*   Lipid Profile: No results for input(s): CHOL, HDL,  LDLCALC, TRIG, CHOLHDL, LDLDIRECT in the last 72 hours. Thyroid Function Tests: No results for input(s): TSH, T4TOTAL, FREET4, T3FREE, THYROIDAB in the last 72 hours. Anemia Panel: No results for input(s): VITAMINB12, FOLATE, FERRITIN, TIBC, IRON, RETICCTPCT in the last 72 hours. Sepsis Labs: Recent Labs  Lab 07/28/19 0513  PROCALCITON 0.85    No results found for this or any previous visit (from the past 240 hour(s)).       Radiology Studies: No results found.      Scheduled Meds: . budesonide (PULMICORT) nebulizer solution  0.5 mg Nebulization BID  . chlorhexidine  15 mL Mouth Rinse BID  . Chlorhexidine Gluconate Cloth  6 each Topical Daily  . diltiazem  60 mg Oral Q8H  . enoxaparin (LOVENOX) injection  1 mg/kg Subcutaneous Q12H  .  feeding supplement  237 mL Oral BID BM  . hydrochlorothiazide  25 mg Oral Daily  . mouth rinse  15 mL Mouth Rinse BID  . polyethylene glycol  17 g Oral BID  . sodium chloride flush  10-40 mL Intracatheter Q12H  . Warfarin - Pharmacist Dosing Inpatient   Does not apply q1600   Continuous Infusions:    LOS: 12 days   Time spent= 25 mins    Blain Pais, MD [facility]  If 7PM-7AM, please contact night-coverage  08/03/2019, 5:28 PM

## 2019-08-04 LAB — GLUCOSE, CAPILLARY
Glucose-Capillary: 111 mg/dL — ABNORMAL HIGH (ref 70–99)
Glucose-Capillary: 137 mg/dL — ABNORMAL HIGH (ref 70–99)

## 2019-08-04 LAB — BASIC METABOLIC PANEL
Anion gap: 9 (ref 5–15)
BUN: 26 mg/dL — ABNORMAL HIGH (ref 8–23)
CO2: 24 mmol/L (ref 22–32)
Calcium: 8.3 mg/dL — ABNORMAL LOW (ref 8.9–10.3)
Chloride: 100 mmol/L (ref 98–111)
Creatinine, Ser: 0.82 mg/dL (ref 0.61–1.24)
GFR calc Af Amer: >60 mL/min (ref >60)
GFR calc non Af Amer: >60 mL/min (ref >60)
Glucose, Bld: 108 mg/dL — ABNORMAL HIGH (ref 70–99)
Potassium: 4.4 mmol/L (ref 3.5–5.1)
Sodium: 133 mmol/L — ABNORMAL LOW (ref 135–145)

## 2019-08-04 LAB — CBC
HCT: 30 % — ABNORMAL LOW (ref 39.0–52.0)
Hemoglobin: 9.9 g/dL — ABNORMAL LOW (ref 13.0–17.0)
MCH: 35.1 pg — ABNORMAL HIGH (ref 26.0–34.0)
MCHC: 33 g/dL (ref 30.0–36.0)
MCV: 106.4 fL — ABNORMAL HIGH (ref 80.0–100.0)
Platelets: 180 10*3/uL (ref 150–400)
RBC: 2.82 MIL/uL — ABNORMAL LOW (ref 4.22–5.81)
RDW: 13.8 % (ref 11.5–15.5)
WBC: 9.3 10*3/uL (ref 4.0–10.5)
nRBC: 0 % (ref 0.0–0.2)

## 2019-08-04 LAB — MAGNESIUM: Magnesium: 1.6 mg/dL — ABNORMAL LOW (ref 1.7–2.4)

## 2019-08-04 LAB — PROTIME-INR
INR: 1.1 (ref 0.8–1.2)
Prothrombin Time: 13.6 seconds (ref 11.4–15.2)

## 2019-08-04 MED ORDER — WARFARIN SODIUM 5 MG PO TABS
5.0000 mg | ORAL_TABLET | Freq: Once | ORAL | Status: AC
  Administered 2019-08-04: 5 mg via ORAL
  Filled 2019-08-04: qty 1

## 2019-08-04 MED ORDER — HYDROMORPHONE HCL 1 MG/ML IJ SOLN: 0.5000 mg | INTRAMUSCULAR | Status: DC | PRN

## 2019-08-04 MED ORDER — ACETAMINOPHEN 325 MG PO TABS
650.0000 mg | ORAL_TABLET | Freq: Four times a day (QID) | ORAL | Status: DC | PRN
  Administered 2019-08-04 (×2): 650 mg via ORAL
  Filled 2019-08-04 (×2): qty 2

## 2019-08-04 MED ORDER — MAGNESIUM SULFATE 4 GM/100ML IV SOLN
4.0000 g | Freq: Once | INTRAVENOUS | Status: AC
  Administered 2019-08-04: 4 g via INTRAVENOUS
  Filled 2019-08-04: qty 100

## 2019-08-04 MED ORDER — OXYCODONE HCL 5 MG PO TABS
5.0000 mg | ORAL_TABLET | ORAL | Status: DC | PRN
  Administered 2019-08-04 – 2019-08-05 (×2): 5 mg via ORAL
  Administered 2019-08-05 – 2019-08-07 (×3): 10 mg via ORAL
  Filled 2019-08-04: qty 2
  Filled 2019-08-04: qty 1
  Filled 2019-08-04 (×2): qty 2
  Filled 2019-08-04: qty 1

## 2019-08-04 NOTE — Progress Notes (Signed)
Physical Therapy Treatment Patient Details Name: Jake [name] MRN: [id number] DOB: October 09, 1945 Today's Date: 08/04/2019    History of Present Illness Pt admitted with SBO and now s/p Exploratory Laparotomy and Enterolysis Decompressive Enterotomy and Small Bowel Anastomosis. post op afib with RVR, potential HCAP.PMhx of COPD, PAF, RA, AAA, and back surgery    PT Comments    The patient is making progress, needs encouragement to mobilize. Will need to assess if family can provide care. Patient reports his 76 YO grandson will assist. Wife is unable. If family unable, then may benefit from SNF. Will attempt to increase activity/ambulation  Follow Up Recommendations  Home health PT;Supervision/Assistance - 24 hour     Equipment Recommendations  Rolling walker with 5" wheels    Recommendations for Other Services       Precautions / Restrictions Precautions Precaution Comments: VAC    Mobility  Bed Mobility   Bed Mobility: Supine to Sit Rolling: Min guard Sidelying to sit: Min assist       General bed mobility comments: increased time, able to mobilize without physical assistance.  Transfers Overall transfer level: Needs assistance Equipment used: Rolling walker (2 wheeled) Transfers: Sit to/from Stand Sit to Stand: Min assist         General transfer comment: cues for use of UEs to self assist;  Physical assist to bring wt up and fwd and to balance in initial standing  Ambulation/Gait Ambulation/Gait assistance: Min assist Gait Distance (Feet): 5 Feet Assistive device: Rolling walker (2 wheeled)   Gait velocity: decr   General Gait Details: cues for RW position, posture. fatigues quickly  , marched in place a 20 steps   Marine scientist Rankin (Stroke Patients Only)       Balance   Sitting-balance support: Bilateral upper extremity supported;Feet supported Sitting balance-Leahy Scale: Good     Standing  balance support: Bilateral upper extremity supported Standing balance-Leahy Scale: Fair                              Cognition Arousal/Alertness: Awake/alert   Overall Cognitive Status: Within Functional Limits for tasks assessed                                 General Comments: follows commands consistently today      Exercises      General Comments        Pertinent Vitals/Pain Faces Pain Scale: Hurts little more Pain Location: abdomen with bed mobility Pain Descriptors / Indicators: Discomfort Pain Intervention(s): Monitored during session;Premedicated before session    Home Living                      Prior Function            PT Goals (current goals can now be found in the care plan section) Progress towards PT goals: Progressing toward goals    Frequency    Min 3X/week      PT Plan Current plan remains appropriate (unless needs SNF)    Co-evaluation              AM-PAC PT "6 Clicks" Mobility   Outcome Measure  Help needed turning from your back to your side while in a flat bed without using bedrails?: A  Little Help needed moving from lying on your back to sitting on the side of a flat bed without using bedrails?: A Little Help needed moving to and from a bed to a chair (including a wheelchair)?: A Little Help needed standing up from a chair using your arms (e.g., wheelchair or bedside chair)?: A Lot Help needed to walk in hospital room?: A Lot Help needed climbing 3-5 steps with a railing? : Total 6 Click Score: 14    End of Session Equipment Utilized During Treatment: Gait belt Activity Tolerance: Patient tolerated treatment well Patient left: in chair;with call bell/phone within reach Nurse Communication: Mobility status PT Visit Diagnosis: Unsteadiness on feet (R26.81);Muscle weakness (generalized) (M62.81);Difficulty in walking, not elsewhere classified (R26.2);Pain     Time: 1624-4695 PT Time  Calculation (min) (ACUTE ONLY): 19 min  Charges:  $Therapeutic Activity: 8-22 mins                     Tresa Endo PT Acute Rehabilitation Services Pager [phone] Office [phone]    Claretha Cooper 08/04/2019, 2:47 PM

## 2019-08-04 NOTE — Progress Notes (Signed)
  L 11.3, W 3.7, D 1.8 Wound with healthy beefy red grannulation tissue at the base. Continue wound vac. Home wound vac and HH ordered.   Alferd Apa, PA-C 08/04/2019, 4:17 PM [facility]

## 2019-08-04 NOTE — TOC Progression Note (Signed)
Transition of Care [facility]) - Progression Note    Patient Details  Name: Atul Delucia MRN: [id number] Date of Birth: [birth year]  [facility]) CM/SW Contact  Leeroy Cha, RN Phone Number: 08/04/2019, 9:07 AM  Clinical Narrative:    Mg is 1.6 this am, rec'ing mgso4 4mg  x1 iv bolus,Wound vac to abd area, eating diet, off tpn. Plan move out of icu to med surg floor   Expected Discharge Plan: Home/Self Care Barriers to Discharge: Continued Medical Work up  Expected Discharge Plan and Services Expected Discharge Plan: Home/Self Care       Living arrangements for the past 2 months: Single Family Home                                       Social Determinants of Health (SDOH) Interventions    Readmission Risk Interventions No flowsheet data found.

## 2019-08-04 NOTE — Progress Notes (Signed)
10 Days Post-Op  Subjective: CC:  Doing well. Eating most to all of his HH diet tray. Drinking ensures. TPN off. No n/v. Passing flatus. Episode of watery stools yesterday. Still have generalized abdominal pain that is most around his wound vac and describes as "soreness" but notes this is better than yesterday. Getting up to bedside commode.   Objective: Vital signs in last 24 hours: Temp:  [97.7 F (36.5 C)-99.3 F (37.4 C)] 99 F (37.2 C) (06/14 0400) Pulse Rate:  [55-113] 90 (06/14 0700) Resp:  [14-29] 19 (06/14 0700) BP: (111-172)/(48-124) 114/65 (06/14 0700) SpO2:  [90 %-97 %] 90 % (06/14 0700) Weight:  [92.6 kg] 92.6 kg (06/14 0622) Last BM Date: 08/03/19  Intake/Output from previous day: 06/13 0701 - 06/14 0700 In: 1047.7 [P.O.:240; I.V.:807.7] Out: 1175 [Urine:1075; Drains:100] Intake/Output this shift: No intake/output data recorded.  PE: Gen:  Alert, NAD, pleasant Card:  Reg rate Pulm:  Normal rate and effort  Abd: Soft, generalized tenderness without peritonitis, +BS, midline wound with wound vac in place with bloody output in cannister , 100cc/24 hours.  Ext:  No LE edema  Skin: no rashes noted, warm and dry  Lab Results:  Recent Labs    08/03/19 0438 08/04/19 0426  WBC 11.3* 9.3  HGB 10.7* 9.9*  HCT 32.6* 30.0*  PLT 176 180   BMET Recent Labs    08/03/19 0438 08/04/19 0426  NA 134* 133*  K 4.9 4.4  CL 102 100  CO2 24 24  GLUCOSE 131* 108*  BUN 29* 26*  CREATININE 0.74 0.82  CALCIUM 8.4* 8.3*   PT/INR No results for input(s): LABPROT, INR in the last 72 hours. CMP     Component Value Date/Time   NA 133 (L) 08/04/2019 0426   K 4.4 08/04/2019 0426   CL 100 08/04/2019 0426   CO2 24 08/04/2019 0426   GLUCOSE 108 (H) 08/04/2019 0426   BUN 26 (H) 08/04/2019 0426   CREATININE 0.82 08/04/2019 0426   CALCIUM 8.3 (L) 08/04/2019 0426   PROT 6.7 07/31/2019 0500   ALBUMIN 3.0 (L) 07/31/2019 0500   AST 45 (H) 07/31/2019 0500   ALT 30  07/31/2019 0500   ALKPHOS 70 07/31/2019 0500   BILITOT 1.6 (H) 07/31/2019 0500   GFRNONAA >60 08/04/2019 0426   GFRAA >60 08/04/2019 0426   Lipase     Component Value Date/Time   LIPASE 19 07/22/2019 1854       Studies/Results: No results found.  Anti-infectives: Anti-infectives (From admission, onward)   Start     Dose/Rate Route Frequency Ordered Stop   07/28/19 1800  vancomycin (VANCOCIN) IVPB 1000 mg/200 mL premix  Status:  Discontinued        1,000 mg 200 mL/hr over 60 Minutes Intravenous Every 12 hours 07/28/19 0621 07/30/19 0807   07/28/19 0445  vancomycin (VANCOREADY) IVPB 2000 mg/400 mL        2,000 mg 200 mL/hr over 120 Minutes Intravenous  Once 07/28/19 0439 07/28/19 0824   07/28/19 0445  piperacillin-tazobactam (ZOSYN) IVPB 3.375 g  Status:  Discontinued        3.375 g 12.5 mL/hr over 240 Minutes Intravenous Every 8 hours 07/28/19 0439 08/01/19 1025   07/25/19 0915  ceFAZolin (ANCEF) IVPB 2g/100 mL premix        2 g 200 mL/hr over 30 Minutes Intravenous On call 07/25/19 0908 07/25/19 1307       Assessment/Plan HTN COPD/100+ pack year history- prior to admit @  1/2 PPD Rheumatoid arthritis Atrial fibrillation - controlled on cardizem PO. Therapeutic Lovenox  Dehydration/acute renal failure - Cr0.82 PMH RA on methotrexate   Small bowel obstruction with history of colon resection in 1980s S/p ex lap, enterolysis, decompressive enterotomy and SB anastomosis 6/4 Dr. Hassell Done - POD #10 - Will see wound vac with WOCN today - Wean IV pain medication and switch to oral pain medication  - OOB, mobilize, PT/OT - Pulm toilet, flutter valve, IS  FEN:HH diet. Ensure. TPN off ID: none currently  VTE: SCDs, Therapeutic Lovenox (Lovenox bridge with Warfarin per cards and primary) GU: condom cath Follow-up: Dr. Hassell Done   LOS: 13 days    Jillyn Ledger , [facility] 08/04/2019, 7:53 AM Please see Amion for pager number during day hours  7:00am-4:30pm

## 2019-08-04 NOTE — TOC Progression Note (Addendum)
Transition of Care [facility]) - Progression Note    Patient Details  Name: Jake [name] MRN: [id number] Date of Birth: 1945/04/04  [facility]) CM/SW Contact  Leeroy Cha, RN Phone Number: 08/04/2019, 12:19 PM  Clinical Narrative:    [facility] contacts for the [facility].  Will email forms Completed forms and orders faxed successfully to tracey barrows. rn and pt sent to [facility] for review of home care. [facility]9 [facility] accepted pt for home care. Expected Discharge Plan: Home/Self Care Barriers to Discharge: Continued Medical Work up  Expected Discharge Plan and Services Expected Discharge Plan: Home/Self Care       Living arrangements for the past 2 months: Single Family Home                                       Social Determinants of Health (SDOH) Interventions    Readmission Risk Interventions No flowsheet data found.

## 2019-08-04 NOTE — Progress Notes (Signed)
Progress Note  Patient Name: Jake [name] Date of Encounter: 08/04/2019  [facility] Cardiologist: No primary care provider on file.   Subjective   Feels well, no complaints  Inpatient Medications    Scheduled Meds: . budesonide (PULMICORT) nebulizer solution  0.5 mg Nebulization BID  . chlorhexidine  15 mL Mouth Rinse BID  . Chlorhexidine Gluconate Cloth  6 each Topical Daily  . diltiazem  60 mg Oral Q8H  . enoxaparin (LOVENOX) injection  1 mg/kg Subcutaneous Q12H  . feeding supplement  237 mL Oral BID BM  . hydrochlorothiazide  25 mg Oral Daily  . mouth rinse  15 mL Mouth Rinse BID  . polyethylene glycol  17 g Oral BID  . sodium chloride flush  10-40 mL Intracatheter Q12H  . Warfarin - Pharmacist Dosing Inpatient   Does not apply q1600   Continuous Infusions:  PRN Meds: acetaminophen, guaiFENesin-dextromethorphan, HYDROmorphone (DILAUDID) injection, ipratropium, labetalol, levalbuterol, methocarbamol, ondansetron **OR** ondansetron (ZOFRAN) IV, oxyCODONE, silver nitrate applicators, sodium chloride flush   Vital Signs    Vitals:   08/04/19 0800 08/04/19 0816 08/04/19 1000 08/04/19 1100  BP: 132/60  95/60 (!) 126/58  Pulse: 91  (!) 104 73  Resp: 19  19 15   Temp: 98.7 F (37.1 C)     TempSrc: Oral     SpO2: 96% 96% 93% 94%  Weight:      Height:        Intake/Output Summary (Last 24 hours) at 08/04/2019 1152 Last data filed at 08/04/2019 1125 Gross per 24 hour  Intake 1487.65 ml  Output 1250 ml  Net 237.65 ml   Last 3 Weights 08/04/2019 07/22/2019 07/22/2019  Weight (lbs) 204 lb 2.3 oz 203 lb 4.2 oz 210 lb  Weight (kg) 92.6 kg 92.2 kg 95.255 kg      Telemetry    afib rates 70-85 - Personally Reviewed  ECG    afib rate 66 - Personally Reviewed  Physical Exam   GEN: No acute distress.   Neck: No JVD Cardiac: iRRR, no murmurs, rubs, or gallops.  Respiratory: Clear to auscultation bilaterally. GI: Soft, +bs MS: No edema; No deformity. Neuro:   Nonfocal  Psych: Normal affect   Labs    High Sensitivity Troponin:   Recent Labs  Lab 07/23/19 0300 07/31/19 2138  TROPONINIHS 24* 27*      Chemistry Recent Labs  Lab 07/30/19 0220 07/30/19 0220 07/31/19 0500 07/31/19 2138 08/02/19 0737 08/03/19 0438 08/04/19 0426  NA 140   < > 140   < > 133* 134* 133*  K 3.4*   < > 3.7   < > 4.4 4.9 4.4  CL 84*   < > 89*   < > 100 102 100  CO2 40*   < > 36*   < > 26 24 24   GLUCOSE 117*   < > 129*   < > 111* 131* 108*  BUN 40*   < > 39*   < > 32* 29* 26*  CREATININE 1.04   < > 0.93   < > 0.73 0.74 0.82  CALCIUM 8.4*   < > 9.3   < > 8.3* 8.4* 8.3*  PROT 5.9*  --  6.7  --   --   --   --   ALBUMIN 2.7*  --  3.0*  --   --   --   --   AST 22  --  45*  --   --   --   --  ALT 15  --  30  --   --   --   --   ALKPHOS 43  --  70  --   --   --   --   BILITOT 1.7*  --  1.6*  --   --   --   --   GFRNONAA >60   < > >60   < > >60 >60 >60  GFRAA >60   < > >60   < > >60 >60 >60  ANIONGAP 16*   < > 15   < > 7 8 9    < > = values in this interval not displayed.     Hematology Recent Labs  Lab 08/02/19 0737 08/03/19 0438 08/04/19 0426  WBC 8.8 11.3* 9.3  RBC 3.03* 3.02* 2.82*  HGB 10.6* 10.7* 9.9*  HCT 32.4* 32.6* 30.0*  MCV 106.9* 107.9* 106.4*  MCH 35.0* 35.4* 35.1*  MCHC 32.7 32.8 33.0  RDW 13.8 13.9 13.8  PLT 148* 176 180    BNP Recent Labs  Lab 07/29/19 0308  BNP 378.9*     DDimer No results for input(s): DDIMER in the last 168 hours.   Radiology    No results found.  Cardiac Studies   ECHO:  07/22/2019 1. Left ventricular ejection fraction, by estimation, is 55 to 60%. The  left ventricle has normal function. The left ventricle has no regional  wall motion abnormalities. Left ventricular diastolic parameters are  indeterminate.  2. Right ventricular systolic function is normal. The right ventricular  size is mildly enlarged. There is mildly elevated pulmonary artery  systolic pressure. The estimated right  ventricular systolic pressure is  75.6 mmHg.  3. The mitral valve is normal in structure. Trivial mitral valve  regurgitation. No evidence of mitral stenosis.  4. The aortic valve is tricuspid. Aortic valve regurgitation is not  visualized. No aortic stenosis is present.  5. The inferior vena cava is normal in size with greater than 50%  respiratory variability, suggesting right atrial pressure of 3 mmHg.  6. The patient was in atrial fibrillation.   Patient Profile     74 y.o. male w/ hx PAF not on anticoag, AAA w/out rupture (3.0 cm 12/2018), HTN, COPD, RA, hep A,who was admitted 06/02 with SBO, NG tube placed and subsequently removed, failed medical management and required exlap/enterolysis on 07/25/19.   Assessment & Plan    1. SBO, mechanical, with exlap/enterolysis, decompressive enterotomy 07/25/19 Improving, feels well.   2. Afib, rates controlled On Diltiazem po 60mg  Q8 hr. Consider consolidating tomorrow am to diltiazem 180 mg daily if rates remain well controlled as they are today.  On therapeutic Lovenox 90 mg Q12 hr bridge to coumadin.   3. HTN - on cardizem, home HCTZ on hold.       For questions or updates, please contact [place] Please consult [email or web address] for contact info under        Signed, Elouise Munroe, MD  08/04/2019, 11:52 AM

## 2019-08-04 NOTE — Progress Notes (Signed)
Nutrition Brief Note  48 hour Calorie Count ordered per MD, 6/13 & 6/14. Patient transferring units today. Will have results on 6/15.    If nutrition issues arise, please consult RD.   Clayton Bibles, MS, RD, LDN Inpatient Clinical Dietitian Contact information available via Amion

## 2019-08-04 NOTE — Progress Notes (Signed)
PROGRESS NOTE    Jake [name]  IRS:[id number] DOB: 12-19-1945 DOA: 07/22/2019 PCP: Glendon Axe, MD   Brief Narrative:  74 y.o.malewithhistory of hypertension rheumatoid arthritis, AAA without rupture, hepatitis C COPD presented to the ER with complaints of abdominal pain and nausea vomiting. In the ER CT abdomen pelvis shows small bowel obstruction and on-call general surgeon Dr. Harlow Asa was consulted who requested NG tube placement and will be seeing patient in consult.  Initially failed medical management therefore surgical intervention was required of 6/4.  Hospital course also complicated by atrial fibrillation with RVR, cardiology consulted.  Patient was started on TPN via PICC line initially slowly transition to oral.  Lovenox switch back to Coumadin.    Assessment & Plan:   Principal Problem:   SBO (small bowel obstruction) (HCC) Active Problems:   A-fib (HCC)   Atrial fibrillation with rapid ventricular response (HCC)   Acute diastolic CHF (congestive heart failure) ([place])   Essential hypertension  Acute hypoxic respiratory failure/COPD with Hypoxia, down to room air Aggressive bronchodilator use.  Incentive spirometer flutter valve.  As needed Lasix.  Small bowel obstruction; mechanical Status post ex lap/enterolysis, decompressive enterotomy 6/4 Failed medical management therefore require surgical intervention on 6/4.   Lovenox bridge to Coumadin. Diet per general surgery.  Slowly advance diet Currently supplemented by PICC/TPN  Atrial fibrillation with RVR, new onset, rate improved Echocardiogram shows EF of 55-60% Cardiology following.  Tolerating p.o. Cardizem.  Currently off Cardizem drip Lovenox bridge to Coumadin.   Essential hypertension Home HCTZ and lisinopril on hold Continue Cardizem  History of rheumatoid arthritis on methotrexate presently n.p.o.  Mild renal insufficiency, baseline creatinine 1.1 -Creatinine peaked at 1.4.  Currently  stable    DVT prophylaxis: Coumadin bridge with Lovenox Code Status: Full code Family Communication: None at bedside Status is: Inpatient  Remains inpatient appropriate because:IV treatments appropriate due to intensity of illness or inability to take PO   Dispo: The patient is from: Home              Anticipated d/c is to: Home              Anticipated d/c date is: 2-3 days              Patient currently is not medically stable to d/c.  Awaiting bowel function to return.  General surgery following.  Transferred to telemetry  Subjective: Feels better no complaints, requiring less assistance with movement now.  Review of Systems Otherwise negative except as per HPI, including: General: Denies fever, chills, night sweats or unintended weight loss. Resp: Denies cough, wheezing, shortness of breath. Cardiac: Denies chest pain, palpitations, orthopnea, paroxysmal nocturnal dyspnea. GI: Denies abdominal pain, nausea, vomiting, diarrhea or constipation GU: Denies dysuria, frequency, hesitancy or incontinence MS: Denies muscle aches, joint pain or swelling Neuro: Denies headache, neurologic deficits (focal weakness, numbness, tingling), abnormal gait Psych: Denies anxiety, depression, SI/HI/AVH Skin: Denies new rashes or lesions ID: Denies sick contacts, exotic exposures, travel  Examination: Constitutional: Not in acute distress Respiratory: Clear to auscultation bilaterally Cardiovascular: Normal sinus rhythm, no rubs Abdomen: Nontender nondistended good bowel sounds Musculoskeletal: No edema noted Skin: No rashes seen Neurologic: CN 2-12 grossly intact.  And nonfocal Psychiatric: Normal judgment and insight. Alert and oriented x 3. Normal mood.  PICC line noted-left upper extremity External Foley catheter Surgical scar on the abdomen noted Objective: Vitals:   08/04/19 0622 08/04/19 0700 08/04/19 0800 08/04/19 0816  BP:  114/65 132/60   Pulse:  90 91   Resp:  19 19    Temp:   98.7 F (37.1 C)   TempSrc:   Oral   SpO2:  90% 96% 96%  Weight: 92.6 kg     Height:        Intake/Output Summary (Last 24 hours) at 08/04/2019 1032 Last data filed at 08/04/2019 0800 Gross per 24 hour  Intake 1287.65 ml  Output 1025 ml  Net 262.65 ml   Filed Weights   07/22/19 1812 07/22/19 2330 08/04/19 0622  Weight: 95.3 kg 92.2 kg 92.6 kg     Data Reviewed:   CBC: Recent Labs  Lab 07/29/19 0304 08/02/19 0737 08/03/19 0438 08/04/19 0426  WBC 8.4 8.8 11.3* 9.3  NEUTROABS 7.1  --   --   --   HGB 11.7* 10.6* 10.7* 9.9*  HCT 35.1* 32.4* 32.6* 30.0*  MCV 105.7* 106.9* 107.9* 106.4*  PLT 155 148* 176 357   Basic Metabolic Panel: Recent Labs  Lab 07/29/19 0304 07/29/19 1600 07/31/19 0500 07/31/19 0500 07/31/19 2138 08/01/19 0232 08/02/19 0737 08/03/19 0438 08/04/19 0426  NA 134*   < > 140   < > 137 137 133* 134* 133*  K 3.2*   < > 3.7   < > 3.0* 3.3* 4.4 4.9 4.4  CL 88*   < > 89*   < > 92* 93* 100 102 100  CO2 31   < > 36*   < > 32 32 26 24 24   GLUCOSE 133*   < > 129*   < > 158* 142* 111* 131* 108*  BUN 42*   < > 39*   < > 39* 40* 32* 29* 26*  CREATININE 1.11   < > 0.93   < > 0.92 0.92 0.73 0.74 0.82  CALCIUM 8.0*   < > 9.3   < > 8.1* 8.2* 8.3* 8.4* 8.3*  MG 1.7   < > 2.4   < > 2.1 1.9 2.0 1.8 1.6*  PHOS 3.1  --  3.4  --   --  4.0 2.8  --   --    < > = values in this interval not displayed.   GFR: Estimated Creatinine Clearance: 88 mL/min (by C-G formula based on SCr of 0.82 mg/dL). Liver Function Tests: Recent Labs  Lab 07/30/19 0220 07/31/19 0500  AST 22 45*  ALT 15 30  ALKPHOS 43 70  BILITOT 1.7* 1.6*  PROT 5.9* 6.7  ALBUMIN 2.7* 3.0*   No results for input(s): LIPASE, AMYLASE in the last 168 hours. No results for input(s): AMMONIA in the last 168 hours. Coagulation Profile: Recent Labs  Lab 08/04/19 0756  INR 1.1   Cardiac Enzymes: No results for input(s): CKTOTAL, CKMB, CKMBINDEX, TROPONINI in the last 168 hours. BNP (last  3 results) No results for input(s): PROBNP in the last 8760 hours. HbA1C: No results for input(s): HGBA1C in the last 72 hours. CBG: Recent Labs  Lab 08/03/19 0735 08/03/19 1557 08/03/19 1935 08/03/19 2305 08/04/19 0807  GLUCAP 114* 134* 133* 101* 111*   Lipid Profile: No results for input(s): CHOL, HDL, LDLCALC, TRIG, CHOLHDL, LDLDIRECT in the last 72 hours. Thyroid Function Tests: No results for input(s): TSH, T4TOTAL, FREET4, T3FREE, THYROIDAB in the last 72 hours. Anemia Panel: No results for input(s): VITAMINB12, FOLATE, FERRITIN, TIBC, IRON, RETICCTPCT in the last 72 hours. Sepsis Labs: No results for input(s): PROCALCITON, LATICACIDVEN in the last 168 hours.  No results found for this or any previous visit (from  the past 240 hour(s)).       Radiology Studies: No results found.      Scheduled Meds:  budesonide (PULMICORT) nebulizer solution  0.5 mg Nebulization BID   chlorhexidine  15 mL Mouth Rinse BID   Chlorhexidine Gluconate Cloth  6 each Topical Daily   diltiazem  60 mg Oral Q8H   enoxaparin (LOVENOX) injection  1 mg/kg Subcutaneous Q12H   feeding supplement  237 mL Oral BID BM   hydrochlorothiazide  25 mg Oral Daily   mouth rinse  15 mL Mouth Rinse BID   polyethylene glycol  17 g Oral BID   sodium chloride flush  10-40 mL Intracatheter Q12H   Warfarin - Pharmacist Dosing Inpatient   Does not apply q1600   Continuous Infusions:    LOS: 13 days   Time spent= 35 mins    Jake Provencio Arsenio Loader, MD [facility]  If 7PM-7AM, please contact night-coverage  08/04/2019, 10:32 AM

## 2019-08-04 NOTE — Progress Notes (Signed)
[facility] for LMWH + warfarin Indication: atrial fibrillation  Allergies  Allergen Reactions  . Propoxyphene Nausea And Vomiting    Patient Measurements: Height: 6\' 1"  (185.4 cm) Weight: 92.6 kg (204 lb 2.3 oz) IBW/kg (Calculated) : 79.9  Vital Signs: Temp: 97.5 F (36.4 C) (06/14 1159) Temp Source: Oral (06/14 1159) BP: 117/61 (06/14 1200) Pulse Rate: 74 (06/14 1200)  Labs: Recent Labs    08/02/19 0737 08/02/19 0737 08/03/19 0438 08/04/19 0426 08/04/19 0756  HGB 10.6*   < > 10.7* 9.9*  --   HCT 32.4*  --  32.6* 30.0*  --   PLT 148*  --  176 180  --   LABPROT  --   --   --   --  13.6  INR  --   --   --   --  1.1  CREATININE 0.73  --  0.74 0.82  --    < > = values in this interval not displayed.    Estimated Creatinine Clearance: 88 mL/min (by C-G formula based on SCr of 0.82 mg/dL).   Medications:  Medications Prior to Admission  Medication Sig Dispense Refill Last Dose  . aspirin 81 MG tablet Take 81 mg by mouth every other day.    Past Week at Unknown time  . folic acid (FOLVITE) 1 MG tablet Take 1 mg by mouth daily.   Past Week at Unknown time  . gabapentin (NEURONTIN) 300 MG capsule Take 300 mg by mouth 2 (two) times daily. Reported on 06/03/2015   Past Week at Unknown time  . hydrochlorothiazide (HYDRODIURIL) 25 MG tablet Take 25 mg by mouth daily. Reported on 06/03/2015   Past Week at Unknown time  . lisinopril (PRINIVIL,ZESTRIL) 10 MG tablet Take 10 mg by mouth daily. Reported on 06/03/2015   Past Week at Unknown time  . magnesium oxide (MAG-OX) 400 MG tablet Take 400 mg by mouth daily.    Past Week at Unknown time  . methotrexate 2.5 MG tablet Take 12.5 mg by mouth once a week. tuesdays   Past Week at Unknown time  . VENTOLIN HFA 108 (90 Base) MCG/ACT inhaler Inhale 1-2 puffs into the lungs every 4 (four) hours as needed.    Past Week at Unknown time  . cephALEXin (KEFLEX) 500 MG capsule Take 1 capsule (500 mg total) by  mouth 3 (three) times daily. 21 capsule 0   . chlorpheniramine-HYDROcodone (TUSSIONEX PENNKINETIC ER) 10-8 MG/5ML SUER Take 5 mLs by mouth every 12 (twelve) hours as needed for cough. 140 mL 0   . HYDROcodone-acetaminophen (NORCO/VICODIN) 5-325 MG tablet Take 1 tablet by mouth every 6 (six) hours as needed. 10 tablet 0   . levofloxacin (LEVAQUIN) 500 MG tablet Take 1 tablet (500 mg total) by mouth daily. 7 tablet 0   . predniSONE (DELTASONE) 20 MG tablet Take 2 tablets (40 mg total) by mouth daily. 10 tablet 0   . sildenafil (REVATIO) 20 MG tablet Reported on 06/03/2015  10    Scheduled:  . budesonide (PULMICORT) nebulizer solution  0.5 mg Nebulization BID  . chlorhexidine  15 mL Mouth Rinse BID  . Chlorhexidine Gluconate Cloth  6 each Topical Daily  . diltiazem  60 mg Oral Q8H  . enoxaparin (LOVENOX) injection  1 mg/kg Subcutaneous Q12H  . feeding supplement  237 mL Oral BID BM  . hydrochlorothiazide  25 mg Oral Daily  . mouth rinse  15 mL Mouth Rinse BID  . polyethylene glycol  17 g Oral BID  . sodium chloride flush  10-40 mL Intracatheter Q12H  . Warfarin - Pharmacist Dosing Inpatient   Does not apply q1600   Infusions:    Assessment: 39 yoM with PAF on ASA only (d/t inability to afford Xarelto), admitted 6/1 for SBO. Subsequently went into AFib w/ RVR and heparin drip started 6/2. Has since been converted to LMWH q12 and now to add warfarin.   Baseline INR wnl; aPTT not done  Prior anticoagulation: ASA >> UFH gtt >> LMWH 1 mg/kg q12 hr  Significant events: 6/11: Lovenox held due to bleeding at wound VAC 6/13: ok to resume Lovenox/warfarin per surgery  Today, 08/04/2019:  CBC: hgb low but stable; Plt WNL  No noted bleeding issues   Major drug interactions: none  Ordered cardiac diet- tolerating 100%  SCr stable WNL  INR 1.1   Goal of Therapy: Heparin level 0.6-1.0 units/ml 4 hr after dose INR 2-3 Monitor platelets by anticoagulation protocol:  Yes  Plan:  Continue Lovenox 90mg  sq q12h until INR therapeutic  Warfarin 5mg  po x1 today  Daily INR; CBC q72 hr  Monitor for signs of bleeding or thrombosis  Warfarin education completed on 6/14    Royetta Asal, PharmD, BCPS 08/04/2019 12:38 PM

## 2019-08-05 LAB — CBC
HCT: 28.8 % — ABNORMAL LOW (ref 39.0–52.0)
Hemoglobin: 9.6 g/dL — ABNORMAL LOW (ref 13.0–17.0)
MCH: 35.7 pg — ABNORMAL HIGH (ref 26.0–34.0)
MCHC: 33.3 g/dL (ref 30.0–36.0)
MCV: 107.1 fL — ABNORMAL HIGH (ref 80.0–100.0)
Platelets: 201 10*3/uL (ref 150–400)
RBC: 2.69 MIL/uL — ABNORMAL LOW (ref 4.22–5.81)
RDW: 13.9 % (ref 11.5–15.5)
WBC: 6.3 10*3/uL (ref 4.0–10.5)
nRBC: 0 % (ref 0.0–0.2)

## 2019-08-05 LAB — BASIC METABOLIC PANEL
Anion gap: 8 (ref 5–15)
BUN: 29 mg/dL — ABNORMAL HIGH (ref 8–23)
CO2: 25 mmol/L (ref 22–32)
Calcium: 7.8 mg/dL — ABNORMAL LOW (ref 8.9–10.3)
Chloride: 100 mmol/L (ref 98–111)
Creatinine, Ser: 0.81 mg/dL (ref 0.61–1.24)
GFR calc Af Amer: >60 mL/min (ref >60)
GFR calc non Af Amer: >60 mL/min (ref >60)
Glucose, Bld: 103 mg/dL — ABNORMAL HIGH (ref 70–99)
Potassium: 4.1 mmol/L (ref 3.5–5.1)
Sodium: 133 mmol/L — ABNORMAL LOW (ref 135–145)

## 2019-08-05 LAB — TSH: TSH: 2.812 u[IU]/mL (ref 0.350–4.500)

## 2019-08-05 LAB — GLUCOSE, CAPILLARY
Glucose-Capillary: 100 mg/dL — ABNORMAL HIGH (ref 70–99)
Glucose-Capillary: 104 mg/dL — ABNORMAL HIGH (ref 70–99)
Glucose-Capillary: 97 mg/dL (ref 70–99)

## 2019-08-05 LAB — MAGNESIUM: Magnesium: 1.9 mg/dL (ref 1.7–2.4)

## 2019-08-05 LAB — PROTIME-INR
INR: 1.2 (ref 0.8–1.2)
Prothrombin Time: 14.4 seconds (ref 11.4–15.2)

## 2019-08-05 LAB — VITAMIN B12: Vitamin B-12: 359 pg/mL (ref 180–914)

## 2019-08-05 MED ORDER — DILTIAZEM HCL ER COATED BEADS 120 MG PO CP24
120.0000 mg | ORAL_CAPSULE | Freq: Every day | ORAL | Status: DC
  Administered 2019-08-05 – 2019-08-07 (×3): 120 mg via ORAL
  Filled 2019-08-05 (×3): qty 1

## 2019-08-05 MED ORDER — WARFARIN SODIUM 5 MG PO TABS
7.5000 mg | ORAL_TABLET | Freq: Once | ORAL | Status: AC
  Administered 2019-08-05: 7.5 mg via ORAL
  Filled 2019-08-05: qty 1

## 2019-08-05 MED ORDER — DILTIAZEM HCL ER COATED BEADS 180 MG PO CP24: 180.0000 mg | ORAL_CAPSULE | Freq: Every day | ORAL | Status: DC

## 2019-08-05 NOTE — Progress Notes (Signed)
Progress Note  Patient Name: Jake [name] Date of Encounter: 08/05/2019  [facility] Cardiologist: No primary care provider on file.   Subjective   No complaints. Feels well.  Inpatient Medications    Scheduled Meds: . budesonide (PULMICORT) nebulizer solution  0.5 mg Nebulization BID  . chlorhexidine  15 mL Mouth Rinse BID  . Chlorhexidine Gluconate Cloth  6 each Topical Daily  . diltiazem  120 mg Oral Daily  . enoxaparin (LOVENOX) injection  1 mg/kg Subcutaneous Q12H  . feeding supplement  237 mL Oral BID BM  . hydrochlorothiazide  25 mg Oral Daily  . mouth rinse  15 mL Mouth Rinse BID  . polyethylene glycol  17 g Oral BID  . sodium chloride flush  10-40 mL Intracatheter Q12H  . warfarin  7.5 mg Oral ONCE-1600  . Warfarin - Pharmacist Dosing Inpatient   Does not apply q1600   Continuous Infusions:  PRN Meds: acetaminophen, guaiFENesin-dextromethorphan, HYDROmorphone (DILAUDID) injection, ipratropium, labetalol, levalbuterol, methocarbamol, ondansetron **OR** ondansetron (ZOFRAN) IV, oxyCODONE, silver nitrate applicators, sodium chloride flush   Vital Signs    Vitals:   08/05/19 0900 08/05/19 1000 08/05/19 1045 08/05/19 1100  BP: (!) 103/50 (!) 101/55  (!) 99/49  Pulse: 78 82  94  Resp: (!) 21 19  20   Temp:      TempSrc:      SpO2: 93% 91% 97% 96%  Weight:      Height:        Intake/Output Summary (Last 24 hours) at 08/05/2019 1221 Last data filed at 08/05/2019 0800 Gross per 24 hour  Intake 340 ml  Output 800 ml  Net -460 ml   Last 3 Weights 08/04/2019 07/22/2019 07/22/2019  Weight (lbs) 204 lb 2.3 oz 203 lb 4.2 oz 210 lb  Weight (kg) 92.6 kg 92.2 kg 95.255 kg      Telemetry    Afib rate controlled, with mild bradycardia around 11 am - Personally Reviewed  ECG    No new - Personally Reviewed  Physical Exam   GEN: No acute distress.   Neck: No JVD Cardiac: iRRR, no murmurs, rubs, or gallops.  Respiratory: Clear to auscultation bilaterally. GI:  Soft, nontender, non-distended  MS: No edema; No deformity. Neuro:  Nonfocal  Psych: Normal affect   Labs    High Sensitivity Troponin:   Recent Labs  Lab 07/23/19 0300 07/31/19 2138  TROPONINIHS 24* 27*      Chemistry Recent Labs  Lab 07/30/19 0220 07/30/19 0220 07/31/19 0500 07/31/19 2138 08/03/19 0438 08/04/19 0426 08/05/19 0420  NA 140   < > 140   < > 134* 133* 133*  K 3.4*   < > 3.7   < > 4.9 4.4 4.1  CL 84*   < > 89*   < > 102 100 100  CO2 40*   < > 36*   < > 24 24 25   GLUCOSE 117*   < > 129*   < > 131* 108* 103*  BUN 40*   < > 39*   < > 29* 26* 29*  CREATININE 1.04   < > 0.93   < > 0.74 0.82 0.81  CALCIUM 8.4*   < > 9.3   < > 8.4* 8.3* 7.8*  PROT 5.9*  --  6.7  --   --   --   --   ALBUMIN 2.7*  --  3.0*  --   --   --   --   AST 22  --  45*  --   --   --   --   ALT 15  --  30  --   --   --   --   ALKPHOS 43  --  70  --   --   --   --   BILITOT 1.7*  --  1.6*  --   --   --   --   GFRNONAA >60   < > >60   < > >60 >60 >60  GFRAA >60   < > >60   < > >60 >60 >60  ANIONGAP 16*   < > 15   < > 8 9 8    < > = values in this interval not displayed.     Hematology Recent Labs  Lab 08/03/19 0438 08/04/19 0426 08/05/19 0420  WBC 11.3* 9.3 6.3  RBC 3.02* 2.82* 2.69*  HGB 10.7* 9.9* 9.6*  HCT 32.6* 30.0* 28.8*  MCV 107.9* 106.4* 107.1*  MCH 35.4* 35.1* 35.7*  MCHC 32.8 33.0 33.3  RDW 13.9 13.8 13.9  PLT 176 180 201    BNPNo results for input(s): BNP, PROBNP in the last 168 hours.   DDimer No results for input(s): DDIMER in the last 168 hours.   Radiology    No results found.  Cardiac Studies   ECHO: 07/22/2019 1. Left ventricular ejection fraction, by estimation, is 55 to 60%. The  left ventricle has normal function. The left ventricle has no regional  wall motion abnormalities. Left ventricular diastolic parameters are  indeterminate.  2. Right ventricular systolic function is normal. The right ventricular  size is mildly enlarged. There is  mildly elevated pulmonary artery  systolic pressure. The estimated right ventricular systolic pressure is  54.2 mmHg.  3. The mitral valve is normal in structure. Trivial mitral valve  regurgitation. No evidence of mitral stenosis.  4. The aortic valve is tricuspid. Aortic valve regurgitation is not  visualized. No aortic stenosis is present.  5. The inferior vena cava is normal in size with greater than 50%  respiratory variability, suggesting right atrial pressure of 3 mmHg.  6. The patient was in atrial fibrillation.    Patient Profile     74 y.o.malew/ hx PAF not on anticoag, AAA w/out rupture (3.0 cm 12/2018), HTN, COPD, RA, hep A,who was admitted 06/02 with SBO, NG tube placed and subsequently removed, failed medical management and required exlap/enterolysis on 07/25/19.   Assessment & Plan     1. SBO, mechanical, with exlap/enterolysis, decompressive enterotomy 07/25/19 Per surgery/IM, improving.  2. Afib, rates controlled Will reduce dose of long acting cardizem to 120 mg daily due to mild hypotension today and bradycardia.  On therapeutic Lovenox 90 mg Q12 hr bridge to coumadin.   3. HTN - on cardizem, home HCTZ on hold. Mild hypotension today, will reduce dose of long acting cardizem.      For questions or updates, please contact [place] Please consult [email or web address] for contact info under        Signed, Elouise Munroe, MD  08/05/2019, 12:21 PM

## 2019-08-05 NOTE — Progress Notes (Signed)
Physical Therapy Treatment Patient Details Name: Jake [name] MRN: [id number] DOB: [birth year] Today's Date: 08/05/2019    History of Present Illness Pt admitted with SBO and now s/p Exploratory Laparotomy and Enterolysis Decompressive Enterotomy and Small Bowel Anastomosis. post op afib with RVR, potential HCAP.PMhx of COPD, PAF, RA, AAA, and back surgery. Placement of wound VAC to abdmen    PT Comments    The patient is much more interactive and talkative today. Patient ambulated x 50' using RW and min assistance for safety. Patient currently will need family to assist with ambulation until improved endurance/safety. Patient has a VAC to abdomen-patient stated" I don't think I am going to have that at home". If family available, patient should progress to Dc to home with HHPT.  Patient's HR 71-97 with activity.SPO2 100%, Pain 4 abdomen cramps.  Follow Up Recommendations  Home health PT;Supervision/Assistance - 24 hour     Equipment Recommendations  None recommended by PT - Has DME   Recommendations for Other Services  OT     Precautions / Restrictions Precautions Precautions: Fall Precaution Comments: VAC Restrictions Weight Bearing Restrictions: No    Mobility  Bed Mobility   Bed Mobility: Supine to Sit Rolling: Supervision         General bed mobility comments: increased time but no assist required  Transfers   Equipment used: Rolling walker (2 wheeled) Transfers: Sit to/from Stand Sit to Stand: Min assist         General transfer comment: steady assist from bed, cues for hand placement  Ambulation/Gait Ambulation/Gait assistance: Min assist Gait Distance (Feet): 50 Feet Assistive device: Rolling walker (2 wheeled) Gait Pattern/deviations: Decreased stride length;Step-through pattern;Narrow base of support Gait velocity: decr   General Gait Details: patients feet close together. Patient tolerated well,   Stairs             Wheelchair  Mobility    Modified Rankin (Stroke Patients Only)       Balance Overall balance assessment: Needs assistance Sitting-balance support: Bilateral upper extremity supported;Feet supported Sitting balance-Leahy Scale: Good     Standing balance support: Bilateral upper extremity supported Standing balance-Leahy Scale: Fair Standing balance comment: supports on RW                            Cognition   Behavior During Therapy: WFL for tasks assessed/performed Overall Cognitive Status: Within Functional Limits for tasks assessed                                 General Comments: patient much more talkative and enteracting with therapist.      Exercises      General Comments        Pertinent Vitals/Pain Faces Pain Scale: Hurts little more Pain Location: lower abd Pain Descriptors / Indicators: Cramping Pain Intervention(s): Monitored during session;Patient requesting pain meds-RN notified    Home Living                      Prior Function            PT Goals (current goals can now be found in the care plan section) Progress towards PT goals: Progressing toward goals    Frequency    Min 3X/week      PT Plan Current plan remains appropriate    Co-evaluation  AM-PAC PT "6 Clicks" Mobility   Outcome Measure  Help needed turning from your back to your side while in a flat bed without using bedrails?: None Help needed moving from lying on your back to sitting on the side of a flat bed without using bedrails?: None Help needed moving to and from a bed to a chair (including a wheelchair)?: A Little Help needed standing up from a chair using your arms (e.g., wheelchair or bedside chair)?: A Little Help needed to walk in hospital room?: A Little Help needed climbing 3-5 steps with a railing? : A Lot 6 Click Score: 19    End of Session Equipment Utilized During Treatment: Gait belt   Patient left: with  nursing/sitter in room (on [facility]) Nurse Communication: Mobility status PT Visit Diagnosis: Unsteadiness on feet (R26.81);Muscle weakness (generalized) (M62.81);Difficulty in walking, not elsewhere classified (R26.2);Pain     Time: 1000-1018 PT Time Calculation (min) (ACUTE ONLY): 18 min  Charges:  $Gait Training: 8-22 mins                     [place] Pager [phone] Office [phone]    Claretha Cooper 08/05/2019, 10:33 AM

## 2019-08-05 NOTE — Progress Notes (Signed)
11 Days Post-Op  Subjective: CC: Doing well. Reports his abdominal pain in his lower abdomen has nearly resolved. Tolerating diet without increased abdominal pain, n/v. He is passing flatus. Last BM yesterday at 8pm.   Objective: Vital signs in last 24 hours: Temp:  [97.3 F (36.3 C)-98.3 F (36.8 C)] 97.9 F (36.6 C) (06/15 0330) Pulse Rate:  [73-106] 92 (06/15 0700) Resp:  [14-21] 21 (06/15 0700) BP: (93-145)/(46-80) 117/51 (06/15 0700) SpO2:  [89 %-98 %] 96 % (06/15 0700) Last BM Date: 08/04/19  Intake/Output from previous day: 06/14 0701 - 06/15 0700 In: 780 [P.O.:680; IV Piggyback:100] Out: 925 [Urine:800; Drains:125] Intake/Output this shift: No intake/output data recorded.  PE: Gen:  Alert, NAD, pleasant Card:  Reg rate Pulm:  Normal rate and effort  Abd: Soft, minimal tenderness of the lower abdomen without peritonitis, +BS, midline wound with wound vac in place with bloody output in cannister , 125cc/24 hours.  Ext:  No LE edema  Skin: no rashes noted, warm and dry   Lab Results:  Recent Labs    08/04/19 0426 08/05/19 0420  WBC 9.3 6.3  HGB 9.9* 9.6*  HCT 30.0* 28.8*  PLT 180 201   BMET Recent Labs    08/04/19 0426 08/05/19 0420  NA 133* 133*  K 4.4 4.1  CL 100 100  CO2 24 25  GLUCOSE 108* 103*  BUN 26* 29*  CREATININE 0.82 0.81  CALCIUM 8.3* 7.8*   PT/INR Recent Labs    08/04/19 0756 08/05/19 0420  LABPROT 13.6 14.4  INR 1.1 1.2   CMP     Component Value Date/Time   NA 133 (L) 08/05/2019 0420   K 4.1 08/05/2019 0420   CL 100 08/05/2019 0420   CO2 25 08/05/2019 0420   GLUCOSE 103 (H) 08/05/2019 0420   BUN 29 (H) 08/05/2019 0420   CREATININE 0.81 08/05/2019 0420   CALCIUM 7.8 (L) 08/05/2019 0420   PROT 6.7 07/31/2019 0500   ALBUMIN 3.0 (L) 07/31/2019 0500   AST 45 (H) 07/31/2019 0500   ALT 30 07/31/2019 0500   ALKPHOS 70 07/31/2019 0500   BILITOT 1.6 (H) 07/31/2019 0500   GFRNONAA >60 08/05/2019 0420   GFRAA >60  08/05/2019 0420   Lipase     Component Value Date/Time   LIPASE 19 07/22/2019 1854       Studies/Results: No results found.  Anti-infectives: Anti-infectives (From admission, onward)   Start     Dose/Rate Route Frequency Ordered Stop   07/28/19 1800  vancomycin (VANCOCIN) IVPB 1000 mg/200 mL premix  Status:  Discontinued        1,000 mg 200 mL/hr over 60 Minutes Intravenous Every 12 hours 07/28/19 0621 07/30/19 0807   07/28/19 0445  vancomycin (VANCOREADY) IVPB 2000 mg/400 mL        2,000 mg 200 mL/hr over 120 Minutes Intravenous  Once 07/28/19 0439 07/28/19 0824   07/28/19 0445  piperacillin-tazobactam (ZOSYN) IVPB 3.375 g  Status:  Discontinued        3.375 g 12.5 mL/hr over 240 Minutes Intravenous Every 8 hours 07/28/19 0439 08/01/19 1025   07/25/19 0915  ceFAZolin (ANCEF) IVPB 2g/100 mL premix        2 g 200 mL/hr over 30 Minutes Intravenous On call 07/25/19 0908 07/25/19 1307       Assessment/Plan HTN COPD/100+ pack year history- prior to admit @ 1/2 PPD Rheumatoid arthritis Atrial fibrillation - controlled on cardizem PO. Therapeutic Lovenox  Dehydration/acute renal failure -  Cr0.82 PMH RA on methotrexate   Small bowel obstruction with history of colon resection in 1980s S/p ex lap, enterolysis, decompressive enterotomy and SB anastomosis 6/4 Dr. Hassell Done - POD #11 - Cont wound vac. Set up with [place], mobilize, PT/OT - Pulm toilet, flutter valve, IS  FEN:HH diet. Ensure. TPN off ID: none currently  RKY:[facility], Therapeutic Lovenox (Lovenox bridge with Warfarin per cards and primary) GU: condom cath Follow-up: Dr. Hassell Done  Plan: The patient is voiding well, tolerating diet, having bowel function, working well with therapies,and  pain well controlled on oral medications. Okay for transfer to the floor/discharge home from our standpoint with follow up with Dr. Hassell Done. HH arranged for wound vac. Also wrote for PT/OT. Okay for PICC line removal from our  standpoint as patient is now off TPN.    LOS: 14 days    Jillyn Ledger , [facility] 08/05/2019, 8:12 AM Please see Amion for pager number during day hours 7:00am-4:30pm

## 2019-08-05 NOTE — Discharge Instructions (Addendum)
Bleeding Precautions When on Anticoagulant Therapy, Adult Anticoagulant therapy, also called blood thinner therapy, is medicine that helps to prevent and treat blood clots. The medicine works by stopping blood clots from forming or growing. Blood clots that form in your blood vessels can be dangerous. They can break loose and travel to the heart, lungs, or brain. This increases the risk of a heart attack, stroke, or blocked lung artery (pulmonary embolism). Anticoagulants also increase the risk of bleeding. Try to protect yourself from cuts and other injuries that can cause bleeding. It is important to take anticoagulants exactly as told by your health care provider. Why do I need to be on anticoagulant therapy? You may need this medicine if you are at risk of developing a blood clot. Conditions that increase your risk of a blood clot include:  Being born with heart disease or a heart malformation (congenital heart disease).  Developing heart disease.  Having had surgery, such as valve replacement.  Having had a serious accident or other type of severe injury (trauma).  Having certain types of cancer.  Having certain diseases that can increase blood clotting.  Having a high risk of stroke or heart attack.  Having atrial fibrillation (AF). What are the common anticoagulant medicines? There are several types of anticoagulant medicines. The most common types are:  Medicines that you take by mouth (oral medicines), such as: ? Warfarin. ? Novel oral anticoagulants (NOACs), such as:  Direct thrombin inhibitors (dabigatran).  Factor Xa inhibitors (apixaban, edoxaban, and rivaroxaban).  Injections, such as: ? Unfractionated heparin. ? Low molecular weight heparin. These anticoagulants work in different ways to prevent blood clots. They also have different risks and side effects. What do I need to remember while on anticoagulant therapy? Taking anticoagulants  Take your medicine at the  same time every day. If you forget to take your medicine, take it as soon as you remember. Do not double your dosage of medicine if you miss a whole day. Take your normal dose and call your health care provider.  Do not stop taking your medicine unless your health care provider approves. Stopping the medicine can increase your risk of developing a blood clot. Taking other medicines  Take over-the-counter and prescriptions medicines only as told by your health care provider.  Do not take over-the-counter NSAIDs, including aspirin and ibuprofen, while you are on anticoagulant therapy. These medicines increase your risk of dangerous bleeding.  Get approval from your health care provider before you start taking any new medicines, vitamins, or herbal products. Some of these could interfere with your therapy. General instructions  Keep all follow-up visits as told by your health care provider. This is important.  If you are pregnant or trying to get pregnant, talk with a health care provider about anticoagulants. Some of these medicines are not safe to take during pregnancy.  Tell all health care providers, including your dentist, that you are on anticoagulant therapy. It is especially important to tell providers before you have any surgery, medical procedures, or dental work done. What precautions should I take?   Be very careful when using knives, scissors, or other sharp objects.  Use an electric razor instead of a blade.  Do not use toothpicks.  Use a soft-bristled toothbrush. Brush your teeth gently.  Always wear shoes outdoors and wear slippers indoors.  Be careful when cutting your fingernails and toenails.  Place bath mats in the bathroom. If possible, install handrails as well.  Wear gloves while you do  yard work.  Wear your seat belt.  Prevent falls by removing loose rugs and extension cords from areas where you walk. Use a cane or walker if you need it.  Avoid  constipation by: ? Drinking enough fluid to keep your urine clear or pale yellow. ? Eating foods that are high in fiber, such as fresh fruits and vegetables, whole grains, and beans. ? Limiting foods that are high in fat and processed sugars, such as fried and sweet foods.  Do not play contact sports or participate in other activities that have a high risk for injury. What other precautions are important if on warfarin therapy? If you are taking a type of anticoagulant called warfarin, make sure you:  Work with a diet and nutrition specialist (dietitian) to make an eating plan. Do not make any sudden changes to your diet after you have started your eating plan.  Do not drink alcohol. It can interfere with your medicine and increase your risk of an injury that causes bleeding.  Get regular blood tests as told by your health care provider. What are some questions to ask my health care provider?  Why do I need anticoagulant therapy?  What is the best anticoagulant therapy for my condition?  How long will I need anticoagulant therapy?  What are the side effects of anticoagulant therapy?  When should I take my medicine? What should I do if I forget to take it?  Will I need to have regular blood tests?  Do I need to change my diet? Are there foods or drinks that I should avoid?  What activities are safe for me?  What should I do if I want to get pregnant? Contact a health care provider if:  You miss a dose of medicine: ? And you are not sure what to do. ? For more than one day.  You have: ? Menstrual bleeding that is heavier than normal. ? Bloody or brown urine. ? Easy bruising. ? Black and tarry stool or bright red stool. ? Side effects from your medicine.  You feel weak or dizzy.  You become pregnant. Get help right away if:  You have bleeding that will not stop within 20 minutes from: ? The nose. ? The gums. ? A cut on the skin.  You have a severe headache or  stomachache.  You vomit or cough up blood.  You fall or hit your head. Summary  Anticoagulant therapy, also called blood thinner therapy, is medicine that helps to prevent and treat blood clots.  Anticoagulants work in different ways to prevent blood clots. They also have different risks and side effects.  Talk with your health care provider about any precautions that you should take while on anticoagulant therapy. This information is not intended to replace advice given to you by your health care provider. Make sure you discuss any questions you have with your health care provider. Document Revised: 05/29/2018 Document Reviewed: 04/25/2016 Elsevier Patient Education  2020 [facility], [place] [phone]  OPEN ABDOMINAL SURGERY: POST OP INSTRUCTIONS  Always review your discharge instruction sheet given to you by the facility where your surgery was performed.  IF YOU HAVE DISABILITY OR FAMILY LEAVE FORMS, YOU MUST BRING THEM TO THE OFFICE FOR PROCESSING.  PLEASE DO NOT GIVE THEM TO YOUR DOCTOR.  1. A prescription for pain medication may be given to you upon discharge.  Take your pain medication as prescribed, if needed.  If narcotic pain medicine is not needed, then you may take acetaminophen (Tylenol) or ibuprofen (Advil) as needed. 2. Take your usually prescribed medications unless otherwise directed. 3. If you need a refill on your pain medication, please contact your pharmacy. They will contact our office to request authorization.  Prescriptions will not be filled after 5pm or on week-ends. 4. You should follow a light diet the first few days after arrival home, such as soup and crackers, pudding, etc.unless your doctor has advised otherwise. A high-fiber, low fat diet can be resumed as tolerated.   Be sure to include lots of fluids daily. Most patients will experience some swelling and bruising on the chest and neck area.  Ice packs will help.   Swelling and bruising can take several days to resolve 5. Most patients will experience some swelling and bruising in the area of the incision. Ice pack will help. Swelling and bruising can take several days to resolve..  6. It is common to experience some constipation if taking pain medication after surgery.  Increasing fluid intake and taking a stool softener will usually help or prevent this problem from occurring.  A mild laxative (Milk of Magnesia or Miralax) should be taken according to package directions if there are no bowel movements after 48 hours. 7.  You may have steri-strips (small skin tapes) in place directly over the incision.  These strips should be left on the skin for 7-10 days.  If your surgeon used skin glue on the incision, you may shower in 24 hours.  The glue will flake off over the next 2-3 weeks.  Any sutures or staples will be removed at the office during your follow-up visit. You may find that a light gauze bandage over your incision may keep your staples from being rubbed or pulled. You may shower and replace the bandage daily. 8. ACTIVITIES:  You may resume regular (light) daily activities beginning the next day--such as daily self-care, walking, climbing stairs--gradually increasing activities as tolerated.  You may have sexual intercourse when it is comfortable.  Refrain from any heavy lifting or straining until approved by your doctor. a. You may drive when you no longer are taking prescription pain medication, you can comfortably wear a seatbelt, and you can safely maneuver your car and apply brakes b. Return to Work: ___________________________________ 38. You should see your doctor in the office for a follow-up appointment approximately two weeks after your surgery.  Make sure that you call for this appointment within a day or two after you arrive home to insure a convenient appointment time. OTHER INSTRUCTIONS:   _____________________________________________________________ _____________________________________________________________  WHEN TO CALL YOUR DOCTOR: 1. Fever over 101.0 2. Inability to urinate 3. Nausea and/or vomiting 4. Extreme swelling or bruising 5. Continued bleeding from incision. 6. Increased pain, redness, or drainage from the incision. 7. Difficulty swallowing or breathing 8. Muscle cramping or spasms. 9. Numbness or tingling in hands or feet or around lips.  The clinic staff is available to answer your questions during regular business hours.  Please don't hesitate to call and ask to speak to one of the nurses if you have concerns.  For further questions, please visit [email or web address]

## 2019-08-05 NOTE — Progress Notes (Signed)
[facility] for LMWH + warfarin Indication: atrial fibrillation  Allergies  Allergen Reactions  . Propoxyphene Nausea And Vomiting    Patient Measurements: Height: 6\' 1"  (185.4 cm) Weight: 92.6 kg (204 lb 2.3 oz) IBW/kg (Calculated) : 79.9  Vital Signs: Temp: 97.7 F (36.5 C) (06/15 0800) Temp Source: Oral (06/15 0800) BP: 101/47 (06/15 0800) Pulse Rate: 57 (06/15 0800)  Labs: Recent Labs    08/03/19 0438 08/03/19 0438 08/04/19 0426 08/04/19 0756 08/05/19 0420  HGB 10.7*   < > 9.9*  --  9.6*  HCT 32.6*  --  30.0*  --  28.8*  PLT 176  --  180  --  201  LABPROT  --   --   --  13.6 14.4  INR  --   --   --  1.1 1.2  CREATININE 0.74  --  0.82  --  0.81   < > = values in this interval not displayed.    Estimated Creatinine Clearance: 89.1 mL/min (by C-G formula based on SCr of 0.81 mg/dL).   Medications:  Medications Prior to Admission  Medication Sig Dispense Refill Last Dose  . aspirin 81 MG tablet Take 81 mg by mouth every other day.    Past Week at Unknown time  . folic acid (FOLVITE) 1 MG tablet Take 1 mg by mouth daily.   Past Week at Unknown time  . gabapentin (NEURONTIN) 300 MG capsule Take 300 mg by mouth 2 (two) times daily. Reported on 06/03/2015   Past Week at Unknown time  . hydrochlorothiazide (HYDRODIURIL) 25 MG tablet Take 25 mg by mouth daily. Reported on 06/03/2015   Past Week at Unknown time  . lisinopril (PRINIVIL,ZESTRIL) 10 MG tablet Take 10 mg by mouth daily. Reported on 06/03/2015   Past Week at Unknown time  . magnesium oxide (MAG-OX) 400 MG tablet Take 400 mg by mouth daily.    Past Week at Unknown time  . methotrexate 2.5 MG tablet Take 12.5 mg by mouth once a week. tuesdays   Past Week at Unknown time  . VENTOLIN HFA 108 (90 Base) MCG/ACT inhaler Inhale 1-2 puffs into the lungs every 4 (four) hours as needed.    Past Week at Unknown time  . cephALEXin (KEFLEX) 500 MG capsule Take 1 capsule (500 mg total) by  mouth 3 (three) times daily. 21 capsule 0   . chlorpheniramine-HYDROcodone (TUSSIONEX PENNKINETIC ER) 10-8 MG/5ML SUER Take 5 mLs by mouth every 12 (twelve) hours as needed for cough. 140 mL 0   . HYDROcodone-acetaminophen (NORCO/VICODIN) 5-325 MG tablet Take 1 tablet by mouth every 6 (six) hours as needed. 10 tablet 0   . levofloxacin (LEVAQUIN) 500 MG tablet Take 1 tablet (500 mg total) by mouth daily. 7 tablet 0   . predniSONE (DELTASONE) 20 MG tablet Take 2 tablets (40 mg total) by mouth daily. 10 tablet 0   . sildenafil (REVATIO) 20 MG tablet Reported on 06/03/2015  10    Scheduled:  . budesonide (PULMICORT) nebulizer solution  0.5 mg Nebulization BID  . chlorhexidine  15 mL Mouth Rinse BID  . Chlorhexidine Gluconate Cloth  6 each Topical Daily  . diltiazem  180 mg Oral Daily  . enoxaparin (LOVENOX) injection  1 mg/kg Subcutaneous Q12H  . feeding supplement  237 mL Oral BID BM  . hydrochlorothiazide  25 mg Oral Daily  . mouth rinse  15 mL Mouth Rinse BID  . polyethylene glycol  17 g Oral  BID  . sodium chloride flush  10-40 mL Intracatheter Q12H  . Warfarin - Pharmacist Dosing Inpatient   Does not apply q1600   Infusions:    Assessment: 27 yoM with PAF on ASA only (d/t inability to afford Xarelto), admitted 6/1 for SBO. Subsequently went into AFib w/ RVR and heparin drip started 6/2. Has since been converted to LMWH q12 and now to add warfarin.   Baseline INR wnl; aPTT not done  Prior anticoagulation: ASA >> UFH gtt >> LMWH 1 mg/kg q12 hr  Significant events: 6/11: Lovenox held due to bleeding at wound VAC 6/13: ok to resume Lovenox/warfarin per surgery  Today, 08/05/2019:  CBC: hgb low but stable; Plt WNL, both trended up   No noted bleeding issues   Major drug interactions: none  Ordered cardiac diet- tolerating 100%  SCr stable WNL  INR 1.s, subtherapeutic after 5 mg dose on 6/14   Goal of Therapy: Heparin level 0.6-1.0 units/ml 4 hr after dose INR 2-3 Monitor  platelets by anticoagulation protocol: Yes  Plan:  Continue Lovenox 90mg  sq q12h until INR therapeutic  Warfarin 7.5mg  po x1 today  Daily INR; CBC q72 hr  Monitor for signs of bleeding or thrombosis  Warfarin education completed on 6/14    Royetta Asal, PharmD, BCPS 08/05/2019 9:29 AM

## 2019-08-05 NOTE — Progress Notes (Signed)
Nutrition Follow-up  DOCUMENTATION CODES:   Not applicable  INTERVENTION:   D/C ensure surgery; pt does not like any ensure products  Magic cup TID with meals, each supplement provides 290 kcal and 9 grams of protein   NUTRITION DIAGNOSIS:   Inadequate oral intake related to inability to eat as evidenced by NPO status. Ongoing.   GOAL:   Patient will meet greater than or equal to 90% of their needs  Progressing  MONITOR:   Diet advancement, Labs, Weight trends, I & O's, Other (Comment) (TPN regimen)  ASSESSMENT:   74 y.o. male with medical history of HTN, rheumatoid arthritis, and COPD. He presented to the ED with abdominal pain and N/V x2 days. His last BM PTA was 3 days PTA.  Per surgery pt tolerating diet; wound vac in place with plans for home health.  Meal Completion: 100% Pt does not like ensure; willing to try magic cups  6/1- admission 6/4- ex lap, enterolysis, decompressive enterotomy; NGT placed in L nare (gastric) 6/7- double lumen PICC placed in L basilic; TPN initiation  Medications reviewed and include: miralax Labs reviewed: Na 133 (L)    Diet Order:   Diet Order            Diet Heart Room service appropriate? Yes; Fluid consistency: Thin  Diet effective now                 EDUCATION NEEDS:   No education needs have been identified at this time  Skin:  Skin Assessment: Skin Integrity Issues: Skin Integrity Issues:: Incisions Incisions: abdomen (6/4)  Last BM:  5/29  Height:   Ht Readings from Last 1 Encounters:  07/22/19 6\' 1"  (1.854 m)    Weight:   Wt Readings from Last 1 Encounters:  08/04/19 92.6 kg    Estimated Nutritional Needs:  Kcal:  2120-2305 kcal Protein:  105-120 grams Fluid:  >/= 2 L/day   Lockie Pares., RD, LDN, CNSC See AMiON for contact information

## 2019-08-05 NOTE — Progress Notes (Signed)
PROGRESS NOTE    Jake [name]  PYP:[id number] DOB: [birth year] DOA: 07/22/2019 PCP: Glendon Axe, MD   Brief Narrative:  74 y.o.malewithhistory of hypertension rheumatoid arthritis, AAA without rupture, hepatitis C COPD presented to the ER with complaints of abdominal pain and nausea vomiting. In the ER CT abdomen pelvis shows small bowel obstruction and on-call general surgeon Dr. Harlow Asa was consulted who requested NG tube placement and will be seeing patient in consult.  Initially failed medical management therefore surgical intervention was required of 6/4.  Hospital course also complicated by atrial fibrillation with RVR, cardiology consulted.  Patient was started on TPN via PICC line initially slowly transition to oral.  Lovenox switch back to Coumadin.    Assessment & Plan:   Principal Problem:   SBO (small bowel obstruction) (HCC) Active Problems:   A-fib (HCC)   Atrial fibrillation with rapid ventricular response (HCC)   Acute diastolic CHF (congestive heart failure) (HCC)   Essential hypertension  Acute hypoxic respiratory failure/COPD with Hypoxia, down to room air Bronchodilators, incentive spirometer and flutter valve  Small bowel obstruction; mechanical Status post ex lap/enterolysis, decompressive enterotomy 6/4 Lovenox bridge to Coumadin Diet per general surgery.  Slowly advance diet Tolerating p.o., discontinue PICC line.  Patient is now off TPN  Atrial fibrillation with RVR, new onset, rate improved Echocardiogram shows EF of 55-60% Cardiology following Transition short acting Cardizem to long-acting-Cardizem CD 180 mg daily Lovenox bridge to Coumadin.  Essential hypertension Home HCTZ and lisinopril on hold Continue Cardizem  History of rheumatoid arthritis on methotrexate presently n.p.o.  Mild renal insufficiency, baseline creatinine 1.1 -Creatinine peaked at 1.4.  Currently stable  Macrocytosis -TSH, B12 within normal limits.   Folate-pending  PT/OT-recommending home health.  DVT prophylaxis: Coumadin bridge with Lovenox Code Status: Full code Family Communication: None at bedside Status is: Inpatient  Remains inpatient appropriate because:IV treatments appropriate due to intensity of illness or inability to take PO   Dispo: The patient is from: Home              Anticipated d/c is to: Home              Anticipated d/c date is: 2 days              Patient currently is not medically stable to d/c.  Awaiting bowel function to return appropriately.  Once cleared by surgery we will transition him home.  Home health  Subjective: Overall feels okay no complaints.  Having good bowel movements and passing gas.  Tolerating p.o. without medical issues  Review of Systems Otherwise negative except as per HPI, including: General: Denies fever, chills, night sweats or unintended weight loss. Resp: Denies cough, wheezing, shortness of breath. Cardiac: Denies chest pain, palpitations, orthopnea, paroxysmal nocturnal dyspnea. GI: Denies abdominal pain, nausea, vomiting, diarrhea or constipation GU: Denies dysuria, frequency, hesitancy or incontinence MS: Denies muscle aches, joint pain or swelling Neuro: Denies headache, neurologic deficits (focal weakness, numbness, tingling), abnormal gait Psych: Denies anxiety, depression, SI/HI/AVH Skin: Denies new rashes or lesions ID: Denies sick contacts, exotic exposures, travel Examination: Constitutional: Not in acute distress Respiratory: Clear to auscultation bilaterally Cardiovascular: Normal sinus rhythm, no rubs Abdomen: Nontender nondistended good bowel sounds Musculoskeletal: No edema noted Skin: No rashes seen Neurologic: CN 2-12 grossly intact.  And nonfocal Psychiatric: Normal judgment and insight. Alert and oriented x 3. Normal mood.  PICC line noted-left upper extremity External Foley catheter Surgical scar on the abdomen noted Objective: Vitals:    08/05/19  0700 08/05/19 0800 08/05/19 0900 08/05/19 1045  BP: (!) 117/51 (!) 101/47 (!) 103/50   Pulse: 92 (!) 57 78   Resp: (!) 21 15 (!) 21   Temp:  97.7 F (36.5 C)    TempSrc:  Oral    SpO2: 96% 93% 93% 97%  Weight:      Height:        Intake/Output Summary (Last 24 hours) at 08/05/2019 1046 Last data filed at 08/05/2019 0800 Gross per 24 hour  Intake 340 ml  Output 1025 ml  Net -685 ml   Filed Weights   07/22/19 1812 07/22/19 2330 08/04/19 0622  Weight: 95.3 kg 92.2 kg 92.6 kg     Data Reviewed:   CBC: Recent Labs  Lab 08/02/19 0737 08/03/19 0438 08/04/19 0426 08/05/19 0420  WBC 8.8 11.3* 9.3 6.3  HGB 10.6* 10.7* 9.9* 9.6*  HCT 32.4* 32.6* 30.0* 28.8*  MCV 106.9* 107.9* 106.4* 107.1*  PLT 148* 176 180 211   Basic Metabolic Panel: Recent Labs  Lab 07/31/19 0500 07/31/19 2138 08/01/19 0232 08/02/19 0737 08/03/19 0438 08/04/19 0426 08/05/19 0420  NA 140   < > 137 133* 134* 133* 133*  K 3.7   < > 3.3* 4.4 4.9 4.4 4.1  CL 89*   < > 93* 100 102 100 100  CO2 36*   < > 32 26 24 24 25   GLUCOSE 129*   < > 142* 111* 131* 108* 103*  BUN 39*   < > 40* 32* 29* 26* 29*  CREATININE 0.93   < > 0.92 0.73 0.74 0.82 0.81  CALCIUM 9.3   < > 8.2* 8.3* 8.4* 8.3* 7.8*  MG 2.4   < > 1.9 2.0 1.8 1.6* 1.9  PHOS 3.4  --  4.0 2.8  --   --   --    < > = values in this interval not displayed.   GFR: Estimated Creatinine Clearance: 89.1 mL/min (by C-G formula based on SCr of 0.81 mg/dL). Liver Function Tests: Recent Labs  Lab 07/30/19 0220 07/31/19 0500  AST 22 45*  ALT 15 30  ALKPHOS 43 70  BILITOT 1.7* 1.6*  PROT 5.9* 6.7  ALBUMIN 2.7* 3.0*   No results for input(s): LIPASE, AMYLASE in the last 168 hours. No results for input(s): AMMONIA in the last 168 hours. Coagulation Profile: Recent Labs  Lab 08/04/19 0756 08/05/19 0420  INR 1.1 1.2   Cardiac Enzymes: No results for input(s): CKTOTAL, CKMB, CKMBINDEX, TROPONINI in the last 168 hours. BNP (last 3  results) No results for input(s): PROBNP in the last 8760 hours. HbA1C: No results for input(s): HGBA1C in the last 72 hours. CBG: Recent Labs  Lab 08/03/19 2305 08/04/19 0807 08/04/19 1553 08/05/19 0005 08/05/19 0740  GLUCAP 101* 111* 137* 97 104*   Lipid Profile: No results for input(s): CHOL, HDL, LDLCALC, TRIG, CHOLHDL, LDLDIRECT in the last 72 hours. Thyroid Function Tests: Recent Labs    08/05/19 0820  TSH 2.812   Anemia Panel: Recent Labs    08/05/19 0820  VITAMINB12 359   Sepsis Labs: No results for input(s): PROCALCITON, LATICACIDVEN in the last 168 hours.  No results found for this or any previous visit (from the past 240 hour(s)).       Radiology Studies: No results found.      Scheduled Meds: . budesonide (PULMICORT) nebulizer solution  0.5 mg Nebulization BID  . chlorhexidine  15 mL Mouth Rinse BID  . Chlorhexidine Gluconate Cloth  6 each Topical Daily  . diltiazem  180 mg Oral Daily  . enoxaparin (LOVENOX) injection  1 mg/kg Subcutaneous Q12H  . feeding supplement  237 mL Oral BID BM  . hydrochlorothiazide  25 mg Oral Daily  . mouth rinse  15 mL Mouth Rinse BID  . polyethylene glycol  17 g Oral BID  . sodium chloride flush  10-40 mL Intracatheter Q12H  . warfarin  7.5 mg Oral ONCE-1600  . Warfarin - Pharmacist Dosing Inpatient   Does not apply q1600   Continuous Infusions:    LOS: 14 days   Time spent= 35 mins    Giavanna Kang Arsenio Loader, MD [facility]  If 7PM-7AM, please contact night-coverage  08/05/2019, 10:46 AM

## 2019-08-06 LAB — FOLATE RBC
Folate, Hemolysate: 428 ng/mL
Folate, RBC: 1497 ng/mL (ref >498)
Hematocrit: 28.6 % — ABNORMAL LOW (ref 37.5–51.0)

## 2019-08-06 LAB — CBC
HCT: 28.6 % — ABNORMAL LOW (ref 39.0–52.0)
Hemoglobin: 9.6 g/dL — ABNORMAL LOW (ref 13.0–17.0)
MCH: 35.3 pg — ABNORMAL HIGH (ref 26.0–34.0)
MCHC: 33.6 g/dL (ref 30.0–36.0)
MCV: 105.1 fL — ABNORMAL HIGH (ref 80.0–100.0)
Platelets: 244 10*3/uL (ref 150–400)
RBC: 2.72 MIL/uL — ABNORMAL LOW (ref 4.22–5.81)
RDW: 14 % (ref 11.5–15.5)
WBC: 4.7 10*3/uL (ref 4.0–10.5)
nRBC: 0 % (ref 0.0–0.2)

## 2019-08-06 LAB — BASIC METABOLIC PANEL
Anion gap: 9 (ref 5–15)
BUN: 26 mg/dL — ABNORMAL HIGH (ref 8–23)
CO2: 24 mmol/L (ref 22–32)
Calcium: 8 mg/dL — ABNORMAL LOW (ref 8.9–10.3)
Chloride: 99 mmol/L (ref 98–111)
Creatinine, Ser: 0.7 mg/dL (ref 0.61–1.24)
GFR calc Af Amer: >60 mL/min (ref >60)
GFR calc non Af Amer: >60 mL/min (ref >60)
Glucose, Bld: 107 mg/dL — ABNORMAL HIGH (ref 70–99)
Potassium: 3.9 mmol/L (ref 3.5–5.1)
Sodium: 132 mmol/L — ABNORMAL LOW (ref 135–145)

## 2019-08-06 LAB — GLUCOSE, CAPILLARY
Glucose-Capillary: 124 mg/dL — ABNORMAL HIGH (ref 70–99)
Glucose-Capillary: 89 mg/dL (ref 70–99)
Glucose-Capillary: 97 mg/dL (ref 70–99)

## 2019-08-06 LAB — PROTIME-INR
INR: 1.2 (ref 0.8–1.2)
Prothrombin Time: 14.4 seconds (ref 11.4–15.2)

## 2019-08-06 LAB — MAGNESIUM: Magnesium: 1.7 mg/dL (ref 1.7–2.4)

## 2019-08-06 MED ORDER — WARFARIN SODIUM 5 MG PO TABS
10.0000 mg | ORAL_TABLET | Freq: Once | ORAL | Status: AC
  Administered 2019-08-06: 10 mg via ORAL
  Filled 2019-08-06: qty 2

## 2019-08-06 MED ORDER — MAGNESIUM OXIDE 400 (241.3 MG) MG PO TABS
800.0000 mg | ORAL_TABLET | Freq: Once | ORAL | Status: AC
  Administered 2019-08-06: 800 mg via ORAL
  Filled 2019-08-06: qty 2

## 2019-08-06 MED ORDER — POTASSIUM CHLORIDE CRYS ER 20 MEQ PO TBCR
40.0000 meq | EXTENDED_RELEASE_TABLET | Freq: Once | ORAL | Status: AC
  Administered 2019-08-06: 40 meq via ORAL
  Filled 2019-08-06: qty 2

## 2019-08-06 MED ORDER — FUROSEMIDE 10 MG/ML IJ SOLN
40.0000 mg | Freq: Once | INTRAMUSCULAR | Status: AC
  Administered 2019-08-06: 40 mg via INTRAVENOUS
  Filled 2019-08-06: qty 4

## 2019-08-06 NOTE — TOC Progression Note (Signed)
Transition of Care [facility]) - Progression Note    Patient Details  Name: Jake [name] MRN: [id number] Date of Birth: February 08, 1946  Transition of [facility]) CM/SW Contact  Jamas Jaquay, Juliann Pulse, RN Phone Number: 08/06/2019, 12:10 PM  Clinical Narrative:Confirmed w/[employer] rep Tracey-wound vac has been delivered to rm-patient's dtr Jackelyn Poling has taken wound vac home;[facility] rep amy aware of HHRN/PT ordered. Benefit check-see prior note for benefit check-dtr can afford enoxaparin through [facility],still needs prior auth-MD aware.coumadin benefit check also noted-dtr can afford. No further CM needs.       Expected Discharge Plan: [place] Barriers to Discharge: Continued Medical Work up  Expected Discharge Plan and Services Expected Discharge Plan: [place]   Discharge Planning Services: CM Consult Post Acute Care Choice: Durable Medical Equipment, Home Health Living arrangements for the past 2 months: Single Family Home                 DME Arranged: Vac   Date DME Agency Contacted: 08/04/19 Time DME Agency Contacted: [id number] Representative spoke with at DME Agency: tracey HH Arranged: PT, RN Minor Agency: [facility] [place] Date [place]: 08/04/19 Time [place]: [id number] Representative spoke with at [place]: amy   Social Determinants of Health ([place]) Interventions    Readmission Risk Interventions No flowsheet data found.

## 2019-08-06 NOTE — TOC Benefit Eligibility Note (Signed)
Transition of Care [facility]) Benefit Eligibility Note    Patient Details  Name: Jake [name] MRN: [id number] Date of Birth: May 06, 1945   Medication/Dose: Lovenox and and Coumadin not covered but Enoxaprian 90 mg sq q12 hrs and Warfin 5 mg po qd  Covered?:  (brands not covered but generic's are)  Tier: Other  Prescription Coverage Preferred [facility]acy: [facility]  Spoke with Person/Company/Phone Number:: [facility] [phone]  Co-Pay: $353.00 for Enoxaparin, $5.00 for Warfin for 30 day supply at local prefered pharmcy  Prior Approval: Yes (Enoxaparin requires prior approval p#[id number])  Deductible: Met       Kerin Salen Phone Number: 08/06/2019, 11:46 AM

## 2019-08-06 NOTE — Progress Notes (Signed)
[place] for LMWH + warfarin Indication: atrial fibrillation  Allergies  Allergen Reactions  . Propoxyphene Nausea And Vomiting    Patient Measurements: Height: 6\' 1"  (185.4 cm) Weight: 92.6 kg (204 lb 2.3 oz) IBW/kg (Calculated) : 79.9  Vital Signs: Temp: 97.5 F (36.4 C) (06/16 0530) Temp Source: Oral (06/16 0530) BP: 120/62 (06/16 1003) Pulse Rate: 74 (06/16 0809)  Labs: Recent Labs    08/04/19 0426 08/04/19 0426 08/04/19 0756 08/05/19 0420 08/06/19 0549  HGB 9.9*   < >  --  9.6* 9.6*  HCT 30.0*  --   --  28.8* 28.6*  PLT 180  --   --  201 244  LABPROT  --   --  13.6 14.4 14.4  INR  --   --  1.1 1.2 1.2  CREATININE 0.82  --   --  0.81 0.70   < > = values in this interval not displayed.    Estimated Creatinine Clearance: 90.2 mL/min (by C-G formula based on SCr of 0.7 mg/dL).  Assessment: 45 yoM with PAF on ASA only (d/t inability to afford Xarelto), admitted 6/1 for SBO. Subsequently went into AFib w/ RVR and heparin drip started 6/2. Has since been converted to LMWH q12 and now to add warfarin.   Baseline INR wnl; aPTT not done  Prior anticoagulation: ASA >> UFH gtt >> LMWH 1 mg/kg q12 hr  Significant events: 6/11: Lovenox held due to bleeding at wound VAC 6/13: ok to resume Lovenox/warfarin per surgery  Today, 08/06/2019:  WSF:KCLEXN   No noted bleeding issues   Major drug interactions: none  Ordered cardiac diet- tolerating 100%  SCr stable WNL  INR 1.2, subtherapeutic after 3 doses of coumadin   Goal of Therapy: Heparin level 0.6-1.0 units/ml 4 hr after dose INR 2-3 Monitor platelets by anticoagulation protocol: Yes INR 2-3  Plan:  Continue Lovenox 90mg  sq q12h until INR therapeutic  Warfarin 10 mg po x1 today  Daily INR; CBC q72 hr  Monitor for signs of bleeding or thrombosis  Warfarin education completed on 6/14   Discharge Recs:  LMWH ( could do 80 or 100 q12 for whole syringe size vs  130 or 150 q24)  And 5 mg coumadin tablets, take 10 mg today and tomorrow and check INR Friday 6/18  Eudelia Bunch, Pharm.D 08/06/2019 10:33 AM

## 2019-08-06 NOTE — Evaluation (Signed)
Occupational Therapy Evaluation Patient Details Name: Jake [name] MRN: [id number] DOB: [birth year] Today's Date: 08/06/2019    History of Present Illness Pt admitted with SBO and now s/p Exploratory Laparotomy and Enterolysis Decompressive Enterotomy and Small Bowel Anastomosis. post op afib with RVR, potential HCAP.PMhx of COPD, PAF, RA, AAA, and back surgery. Placement of wound VAC to abdmen   Clinical Impression   Mr. Zollie Ellery is a 74 year old man s/p exploratory laparotomy and prolonged hospitalization who demonstrates ability to perform functional mobility and ADLs with supervision. Patient required assistance for managing wound vac pump and line but otherwise performed well. Patient reports having spouse and grandson at home to assist him as well as DME. No OT needs at this time.    Follow Up Recommendations  No OT follow up    Equipment Recommendations  None recommended by OT    Recommendations for Other Services       Precautions / Restrictions Precautions Precautions: Fall Precaution Comments: VAC      Mobility Bed Mobility Overal bed mobility: Modified Independent             General bed mobility comments: Patient used bed rail to transfer to side of bed.  Transfers                 General transfer comment: Supervision and use of RW to ambulate in room, stand at toilet, stand at sink and return to recliner. Therapist assisted by managing wound vac and line.    Balance Overall balance assessment: No apparent balance deficits (not formally assessed)                                         ADL either performed or assessed with clinical judgement   ADL   Eating/Feeding: Independent   Grooming: Independent   Upper Body Bathing: Set up;Supervision/ safety   Lower Body Bathing: Set up;Supervison/ safety   Upper Body Dressing : Supervision/safety   Lower Body Dressing: Set up;Supervision/safety Lower Body Dressing Details  (indicate cue type and reason): Patient donned slippers at side of bed. Toilet Transfer: Warehouse manager and Hygiene: Social worker Details (indicate cue type and reason): Patient performed toileting with supervision from therapist. Tub/ Shower Transfer: Supervision/safety   Functional mobility during ADLs: Supervision/safety       Vision Baseline Vision/History: No visual deficits Vision Assessment?: No apparent visual deficits     Perception     Praxis      Pertinent Vitals/Pain Pain Assessment: No/denies pain     Hand Dominance     Extremity/Trunk Assessment Upper Extremity Assessment Upper Extremity Assessment: Overall WFL for tasks assessed   Lower Extremity Assessment Lower Extremity Assessment: Defer to PT evaluation   Cervical / Trunk Assessment Cervical / Trunk Assessment: Normal   Communication     Cognition Arousal/Alertness: Awake/alert Behavior During Therapy: WFL for tasks assessed/performed Overall Cognitive Status: Within Functional Limits for tasks assessed                                     General Comments       Exercises     Shoulder Instructions      Home Living  Prior Functioning/Environment                   OT Problem List:        OT Treatment/Interventions:      OT Goals(Current goals can be found in the care plan section) Acute Rehab OT Goals Patient Stated Goal: to go home OT Goal Formulation: All assessment and education complete, DC therapy  OT Frequency:     Barriers to D/C:            Co-evaluation              AM-PAC OT "6 Clicks" Daily Activity     Outcome Measure Help from another person eating meals?: None Help from another person taking care of personal grooming?: None Help from another person toileting, which includes using toliet, bedpan, or  urinal?: None Help from another person bathing (including washing, rinsing, drying)?: None Help from another person to put on and taking off regular upper body clothing?: None Help from another person to put on and taking off regular lower body clothing?: None 6 Click Score: 24   End of Session Equipment Utilized During Treatment: Gait belt;Rolling walker Nurse Communication:  (okay to see patient)  Activity Tolerance: Patient tolerated treatment well Patient left: in chair;with call bell/phone within reach  OT Visit Diagnosis: Muscle weakness (generalized) (M62.81)                Time: 6160-7371 OT Time Calculation (min): 13 min Charges:  OT General Charges $OT Visit: 1 Visit OT Evaluation $OT Eval Low Complexity: 1 Low  William Laske, OTR/L [place]  Office [phone] Pager: [phone]   Lenward Chancellor 08/06/2019, 4:36 PM

## 2019-08-06 NOTE — Progress Notes (Signed)
PROGRESS NOTE    Jake [name]  HDQ:[id number] DOB: August 12, 1945 DOA: 07/22/2019 PCP: Glendon Axe, MD   Brief Narrative:  74 y.o.malewithhistory of hypertension rheumatoid arthritis, AAA without rupture, hepatitis C COPD presented to the ER with complaints of abdominal pain and nausea vomiting. In the ER CT abdomen pelvis shows small bowel obstruction and on-call general surgeon Dr. Harlow Asa was consulted who requested NG tube placement and will be seeing patient in consult.  Initially failed medical management therefore surgical intervention was required of 6/4.  Hospital course also complicated by atrial fibrillation with RVR, cardiology consulted.  Patient was started on TPN via PICC line initially slowly transition to oral.  Lovenox switch back to Coumadin.  Assessment & Plan:   Principal Problem:   SBO (small bowel obstruction) (HCC) Active Problems:   A-fib (HCC)   Atrial fibrillation with rapid ventricular response (HCC)   Acute diastolic CHF (congestive heart failure) (HCC)   Essential hypertension  Acute hypoxic respiratory failure/COPD with Hypoxia, down to room air Bronchodilators, incentive spirometer and flutter valve  Small bowel obstruction; mechanical Status post ex lap/enterolysis, decompressive enterotomy 6/4 Lovenox bridge to Coumadin Tolerating oral diet.  Home health arrangements for wound VAC. TPN off. Outpatient follow-up with Dr. Hassell Done  Atrial fibrillation with RVR, new onset, rate improved Subtherapeutic INR Echocardiogram shows EF of 55-60% Cardiology following Transition short acting Cardizem to long-acting-Cardizem CD 120 mg daily Lovenox bridge to Coumadin.  INR still 1.2  Essential hypertension Home HCTZ and lisinopril on hold Continue Cardizem  History of rheumatoid arthritis on methotrexate presently n.p.o.  Mild renal insufficiency, baseline creatinine 1.1 -Creatinine peaked at 1.4.  Currently stable.  Today's 0.7  Macrocytosis -TSH,  B12 within normal limits.    PT/OT-recommending home health.  DVT prophylaxis: Coumadin bridge with Lovenox Code Status: Full code Family Communication:  Status is: Inpatient  Remains inpatient appropriate because:IV treatments appropriate due to intensity of illness or inability to take PO   Dispo: The patient is from: Home              Anticipated d/c is to: Home              Anticipated d/c date is: 1 day              Patient currently is not medically stable to d/c.  Getting Lovenox to Coumadin bridge.  Once INR is therapeutic, will transition him home with therapy  Subjective: Tolerating oral, having bowel movement.  No new complaints.  Review of Systems Otherwise negative except as per HPI, including: General: Denies fever, chills, night sweats or unintended weight loss. Resp: Denies cough, wheezing, shortness of breath. Cardiac: Denies chest pain, palpitations, orthopnea, paroxysmal nocturnal dyspnea. GI: Denies abdominal pain, nausea, vomiting, diarrhea or constipation GU: Denies dysuria, frequency, hesitancy or incontinence MS: Denies muscle aches, joint pain or swelling Neuro: Denies headache, neurologic deficits (focal weakness, numbness, tingling), abnormal gait Psych: Denies anxiety, depression, SI/HI/AVH Skin: Denies new rashes or lesions ID: Denies sick contacts, exotic exposures, travel  Examination: Constitutional: Not in acute distress Respiratory: Clear to auscultation bilaterally Cardiovascular: Normal sinus rhythm, no rubs Abdomen: Nontender nondistended good bowel sounds Musculoskeletal: No edema noted Skin: No rashes seen Neurologic: CN 2-12 grossly intact.  And nonfocal Psychiatric: Normal judgment and insight. Alert and oriented x 3. Normal mood.  External Foley catheter Surgical scar on the abdomen noted-wound VAC in place Objective: Vitals:   08/06/19 0203 08/06/19 0530 08/06/19 0809 08/06/19 1003  BP: 139/75 118/65  120/62  Pulse: 74 73 74    Resp: 20 20 20    Temp: 98.7 F (37.1 C) (!) 97.5 F (36.4 C)    TempSrc: Oral Oral    SpO2: 95% 95% 96%   Weight:      Height:        Intake/Output Summary (Last 24 hours) at 08/06/2019 1034 Last data filed at 08/06/2019 0941 Gross per 24 hour  Intake 600 ml  Output 2050 ml  Net -1450 ml   Filed Weights   07/22/19 1812 07/22/19 2330 08/04/19 0622  Weight: 95.3 kg 92.2 kg 92.6 kg     Data Reviewed:   CBC: Recent Labs  Lab 08/02/19 0737 08/03/19 0438 08/04/19 0426 08/05/19 0420 08/06/19 0549  WBC 8.8 11.3* 9.3 6.3 4.7  HGB 10.6* 10.7* 9.9* 9.6* 9.6*  HCT 32.4* 32.6* 30.0* 28.8* 28.6*  MCV 106.9* 107.9* 106.4* 107.1* 105.1*  PLT 148* 176 180 201 412   Basic Metabolic Panel: Recent Labs  Lab 07/31/19 0500 07/31/19 2138 08/01/19 0232 08/01/19 0232 08/02/19 0737 08/03/19 0438 08/04/19 0426 08/05/19 0420 08/06/19 0549  NA 140   < > 137   < > 133* 134* 133* 133* 132*  K 3.7   < > 3.3*   < > 4.4 4.9 4.4 4.1 3.9  CL 89*   < > 93*   < > 100 102 100 100 99  CO2 36*   < > 32   < > 26 24 24 25 24   GLUCOSE 129*   < > 142*   < > 111* 131* 108* 103* 107*  BUN 39*   < > 40*   < > 32* 29* 26* 29* 26*  CREATININE 0.93   < > 0.92   < > 0.73 0.74 0.82 0.81 0.70  CALCIUM 9.3   < > 8.2*   < > 8.3* 8.4* 8.3* 7.8* 8.0*  MG 2.4   < > 1.9   < > 2.0 1.8 1.6* 1.9 1.7  PHOS 3.4  --  4.0  --  2.8  --   --   --   --    < > = values in this interval not displayed.   GFR: Estimated Creatinine Clearance: 90.2 mL/min (by C-G formula based on SCr of 0.7 mg/dL). Liver Function Tests: Recent Labs  Lab 07/31/19 0500  AST 45*  ALT 30  ALKPHOS 70  BILITOT 1.6*  PROT 6.7  ALBUMIN 3.0*   No results for input(s): LIPASE, AMYLASE in the last 168 hours. No results for input(s): AMMONIA in the last 168 hours. Coagulation Profile: Recent Labs  Lab 08/04/19 0756 08/05/19 0420 08/06/19 0549  INR 1.1 1.2 1.2   Cardiac Enzymes: No results for input(s): CKTOTAL, CKMB, CKMBINDEX,  TROPONINI in the last 168 hours. BNP (last 3 results) No results for input(s): PROBNP in the last 8760 hours. HbA1C: No results for input(s): HGBA1C in the last 72 hours. CBG: Recent Labs  Lab 08/05/19 0005 08/05/19 0740 08/05/19 1207 08/06/19 0031 08/06/19 0734  GLUCAP 97 104* 100* 89 97   Lipid Profile: No results for input(s): CHOL, HDL, LDLCALC, TRIG, CHOLHDL, LDLDIRECT in the last 72 hours. Thyroid Function Tests: Recent Labs    08/05/19 0820  TSH 2.812   Anemia Panel: Recent Labs    08/05/19 0820  VITAMINB12 359   Sepsis Labs: No results for input(s): PROCALCITON, LATICACIDVEN in the last 168 hours.  No results found for this or any previous visit (from the past 240  hour(s)).       Radiology Studies: No results found.      Scheduled Meds: . budesonide (PULMICORT) nebulizer solution  0.5 mg Nebulization BID  . chlorhexidine  15 mL Mouth Rinse BID  . Chlorhexidine Gluconate Cloth  6 each Topical Daily  . diltiazem  120 mg Oral Daily  . enoxaparin (LOVENOX) injection  1 mg/kg Subcutaneous Q12H  . hydrochlorothiazide  25 mg Oral Daily  . mouth rinse  15 mL Mouth Rinse BID  . polyethylene glycol  17 g Oral BID  . sodium chloride flush  10-40 mL Intracatheter Q12H  . Warfarin - Pharmacist Dosing Inpatient   Does not apply q1600   Continuous Infusions:    LOS: 15 days   Time spent= 35 mins    Zooey Schreurs Arsenio Loader, MD [facility]  If 7PM-7AM, please contact night-coverage  08/06/2019, 10:34 AM

## 2019-08-06 NOTE — Final Consult Note (Signed)
Consultant Final Sign-Off Note    Assessment/Final recommendations  Jake [name] is a 74 y.o. 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 Hassell Done - POD #12 - Cont wound vac. Change M/W/F. Set up with [place] - OOB,mobilize,PT/OT -Pulm toilet, flutter valve,IS  FEN:HH diet. Ensure. TPN off EP:PIRJ currently JOA:CZYS, Lovenox to Coumadin  GU: condom cath Follow-up: Dr. Hassell Done  Plan: The patient is voiding well, tolerating diet, having bowel function, working well with therapies ,and  pain well controlled on oral medications. Okay for discharge home from our standpoint with follow up with Dr. Hassell Done. HH arranged for wound vac. Also wrote for PT/OT. Okay for PICC line removal from our standpoint as patient is now off TPN. We will sign off.   Wound care (if applicable): Wound vac with changes M/W/F   Diet at discharge: Soft   Activity at discharge: Do not lift greater than 10lbs for 6 weeks post op   Follow-up appointment: Dr. Hassell Done   Pending results:  Unresulted Labs (From admission, onward) Comment          Start     Ordered   08/05/19 0730  Folate RBC  Add-on,   AD       Question:  Specimen collection method  Answer:  Unit=Unit collect   08/05/19 0730   08/05/19 0500  Protime-INR  Daily,   R     Question:  Specimen collection method  Answer:  Unit=Unit collect   08/04/19 0719   08/03/19 0630  Basic metabolic panel  Daily,   R     Question:  Specimen collection method  Answer:  Unit=Unit collect   08/02/19 0732   08/03/19 0500  Magnesium  Daily,   R     Question:  Specimen collection method  Answer:  Unit=Unit collect   08/02/19 0732           Medication recommendations:   Other recommendations:    Thank you for allowing [place] to participate in the care of your patient!  Please consult [place] again if you have further needs for your  patient.  Barth Kirks [facility] 08/06/2019 10:04 AM    Subjective   Patient reports no abdominal pain, n/v. Tolerating diet. Passing flatus. Soft BM yesterday.   Objective  Vital signs in last 24 hours: Temp:  [97.3 F (36.3 C)-98.7 F (37.1 C)] 97.5 F (36.4 C) (06/16 0530) Pulse Rate:  [73-94] 74 (06/16 0809) Resp:  [15-21] 20 (06/16 0809) BP: (92-139)/(42-75) 120/62 (06/16 1003) SpO2:  [94 %-97 %] 96 % (06/16 0809)  Gen: Alert, NAD, pleasant Card:Reg rate Pulm:Tachypnea. Right sided expiratory wheezing noted. RN awake. Patient has PRN nebs ordered. Abd: Soft,minimal tenderness of the the right side of his abdomen without peritonitis,+BS, midline wound with wound vac in place with bloody output in cannister. Ext: No LE edema Skin: no rashes noted, warm and dry   Pertinent labs and Studies: Recent Labs    08/04/19 0426 08/05/19 0420 08/06/19 0549  WBC 9.3 6.3 4.7  HGB 9.9* 9.6* 9.6*  HCT 30.0* 28.8* 28.6*   BMET Recent Labs    08/05/19 0420 08/06/19 0549  NA 133* 132*  K 4.1 3.9  CL 100 99  CO2 25 24  GLUCOSE 103* 107*  BUN 29* 26*  CREATININE 0.81 0.70  CALCIUM 7.8* 8.0*   No results for input(s): LABURIN in the last 72  hours. Results for orders placed or performed during the hospital encounter of 07/22/19  SARS Coronavirus 2 by RT PCR (hospital order, performed in [facility] hospital lab) Nasopharyngeal Nasopharyngeal Swab     Status: None   Collection Time: 07/22/19  6:54 PM   Specimen: Nasopharyngeal Swab  Result Value Ref Range Status   SARS Coronavirus 2 NEGATIVE NEGATIVE Final    Comment: (NOTE) SARS-CoV-2 target nucleic acids are NOT DETECTED. The SARS-CoV-2 RNA is generally detectable in upper and lower respiratory specimens during the acute phase of infection. The lowest concentration of SARS-CoV-2 viral copies this assay can detect is 250 copies / mL. A negative result does not preclude SARS-CoV-2 infection and should not be used as  the sole basis for treatment or other patient management decisions.  A negative result may occur with improper specimen collection / handling, submission of specimen other than nasopharyngeal swab, presence of viral mutation(s) within the areas targeted by this assay, and inadequate number of viral copies (<250 copies / mL). A negative result must be combined with clinical observations, patient history, and epidemiological information. Fact Sheet for Patients:   StrictlyIdeas.no Fact Sheet for Healthcare Providers: BankingDealers.co.za This test is not yet approved or cleared  by the [place] FDA and has been authorized for detection and/or diagnosis of SARS-CoV-2 by FDA under an Emergency Use Authorization (EUA).  This EUA will remain in effect (meaning this test can be used) for the duration of the COVID-19 declaration under Section 564(b)(1) of the Act, 21 U.S.C. section 360bbb-3(b)(1), unless the authorization is terminated or revoked sooner. Performed at [facility], [address] [place], [place] [address]   MRSA PCR Screening     Status: None   Collection Time: 07/22/19 11:25 PM   Specimen: Nasal Mucosa; Nasopharyngeal  Result Value Ref Range Status   MRSA by PCR NEGATIVE NEGATIVE Final    Comment:        The GeneXpert MRSA Assay (FDA approved for NASAL specimens only), is one component of a comprehensive MRSA colonization surveillance program. It is not intended to diagnose MRSA infection nor to guide or monitor treatment for MRSA infections. Performed at [facility], [place] [address] [place], [place] [address]     Imaging: No results found.

## 2019-08-06 NOTE — Care Management Important Message (Signed)
Important Message  Patient Details IM Letter given to Dessa Phi RN Case Manager to present to the Patient Name: Jake [name] MRN: [id number] Date of Birth: 01-31-1946   Medicare Important Message Given:  Yes     Kerin Salen 08/06/2019, 9:57 AM

## 2019-08-07 LAB — MAGNESIUM: Magnesium: 1.8 mg/dL (ref 1.7–2.4)

## 2019-08-07 LAB — PROTIME-INR
INR: 1.3 — ABNORMAL HIGH (ref 0.8–1.2)
Prothrombin Time: 16 seconds — ABNORMAL HIGH (ref 11.4–15.2)

## 2019-08-07 LAB — BASIC METABOLIC PANEL
Anion gap: 11 (ref 5–15)
BUN: 27 mg/dL — ABNORMAL HIGH (ref 8–23)
CO2: 24 mmol/L (ref 22–32)
Calcium: 7.8 mg/dL — ABNORMAL LOW (ref 8.9–10.3)
Chloride: 96 mmol/L — ABNORMAL LOW (ref 98–111)
Creatinine, Ser: 1.01 mg/dL (ref 0.61–1.24)
GFR calc Af Amer: >60 mL/min (ref >60)
GFR calc non Af Amer: >60 mL/min (ref >60)
Glucose, Bld: 113 mg/dL — ABNORMAL HIGH (ref 70–99)
Potassium: 4.2 mmol/L (ref 3.5–5.1)
Sodium: 131 mmol/L — ABNORMAL LOW (ref 135–145)

## 2019-08-07 LAB — GLUCOSE, CAPILLARY
Glucose-Capillary: 109 mg/dL — ABNORMAL HIGH (ref 70–99)
Glucose-Capillary: 97 mg/dL (ref 70–99)

## 2019-08-07 MED ORDER — DILTIAZEM HCL ER COATED BEADS 120 MG PO CP24: 120.0000 mg | ORAL_CAPSULE | Freq: Every day | ORAL | 0 refills | Status: DC

## 2019-08-07 MED ORDER — WARFARIN SODIUM 5 MG PO TABS
10.0000 mg | ORAL_TABLET | Freq: Once | ORAL | Status: AC
  Administered 2019-08-07: 10 mg via ORAL
  Filled 2019-08-07: qty 2

## 2019-08-07 MED ORDER — WARFARIN SODIUM 5 MG PO TABS: 5.0000 mg | ORAL_TABLET | Freq: Every day | ORAL | 0 refills | Status: DC

## 2019-08-07 MED ORDER — ENOXAPARIN SODIUM 100 MG/ML ~~LOC~~ SOLN: 100.0000 mg | Freq: Two times a day (BID) | SUBCUTANEOUS | 1 refills | Status: DC

## 2019-08-07 NOTE — TOC Transition Note (Signed)
Transition of Care [facility]) - CM/SW Discharge Note   Patient Details  Name: Jake [name] MRN: [id number] Date of Birth: [birth year]  Transition of Care [facility]) CM/SW Contact:  Dessa Phi, RN Phone Number: 08/07/2019, 10:22 AM   Clinical Narrative: d/c home w/[facility];wound vac [facility] already @ home.goodrx for script. No further CM needs.        Barriers to Discharge: No Barriers Identified   Patient Goals and CMS Choice Patient states their goals for this hospitalization and ongoing recovery are:: to return home CMS Medicare.gov Compare Post Acute Care list provided to:: Patient Choice offered to / list presented to : Patient  Discharge Placement                       Discharge Plan and Services   Discharge Planning Services: CM Consult Post Acute Care Choice: Durable Medical Equipment, Home Health          DME Arranged: Vac   Date DME Agency Contacted: 08/04/19 Time DME Agency Contacted: [id number] Representative spoke with at DME Agency: tracey HH Arranged: PT, RN [place] Agency: [facility] [place] Date [place]: 08/04/19 Time [place]: [id number] Representative spoke with at [place]: amy  Social Determinants of Health ([place]) Interventions     Readmission Risk Interventions No flowsheet data found.

## 2019-08-07 NOTE — Discharge Summary (Signed)
Physician Discharge Summary  Jake [name] EYC:[id number] DOB: [birth year] DOA: 07/22/2019  PCP: Glendon Axe, MD  Admit date: 07/22/2019 Discharge date: 08/07/2019  Admitted From: Home Disposition: Home  Recommendations for Outpatient Follow-up:  1. Follow up with PCP in 1-2 weeks 2. Please obtain BMP/CBC in one week your next doctors visit.  3. Discontinue aspirin hydrochlorothiazide and lisinopril.  Resume outpatient medications as deemed necessary. 4. Cardizem 120 mg daily 5. Lovenox to Coumadin bridge.  Goal INR 2.0-3.0.  Has outpatient appointment with Coumadin clinic and cardiology. 6. Wound VAC management recommendations per general surgery.   Discharge Condition: Stable CODE STATUS: Full code Diet recommendation: Heart healthy  Brief/Interim Summary: 74 y.o.malewithhistory of hypertension rheumatoid arthritis, AAA without rupture, hepatitis C COPD presentedto the ER with complaints of abdominal pain and nausea vomiting. In the ER CT abdomen pelvis shows small bowel obstruction and on-call general surgeon Dr. Harlow Asa was consulted who requested NG tube placement and will be seeing patient in consult.  Initially failed medical management therefore surgical intervention was required of 6/4.  Hospital course also complicated by atrial fibrillation with RVR, cardiology consulted.  Patient was started on TPN via PICC line initially slowly transition to oral.  Lovenox switch back to Coumadin.  Eventually his hydrochlorothiazide was discontinued, started on Cardizem p.o. which he tolerated well without any issues.  Patient is stable for discharge today.  Outpatient home health arrangements have been made by general surgery team.  Cost of Lovenox was discussed by me and the care manager with the patient and the daughter.  They are agreeable as they will be using good [email or web address] to pay for Lovenox.  Assessment & Plan:   Principal Problem:   SBO (small bowel obstruction) (HCC) Active  Problems:   A-fib (HCC)   Atrial fibrillation with rapid ventricular response (HCC)   Acute diastolic CHF (congestive heart failure) ([place])   Essential hypertension  Acute hypoxic respiratory failure/COPD with Hypoxia, down to room air Greatly improved.  Small bowel obstruction; mechanical Status post ex lap/enterolysis, decompressive enterotomy 6/4 Tolerating oral diet.  Home health arrangements for wound VAC. TPN off. Outpatient follow-up with Dr. Hassell Done  Atrial fibrillation with RVR, new onset, rate improved INR still remains therapeutic, he will be going home with Lovenox to Coumadin bridge.  Cost him prescription has been discussed with the patient.  They will be using good [email or web address]. Echocardiogram shows EF of 55-60% Discontinue aspirin.  Appreciate cardiology input. On Cardizem 120 mg daily Outpatient follow-up with cardiology.  Essential hypertension Now Cardizem.  Resume outpatient lisinopril as appropriate  History of rheumatoid arthritis on methotrexate presently n.p.o.  Mild renal insufficiency, baseline creatinine 1.1 -Creatinine peaked at 1.4.  Currently stable.   Macrocytosis -TSH, B12 within normal limits.    PT/OT-recommending home health.   Discharge Diagnoses:  Principal Problem:   SBO (small bowel obstruction) (HCC) Active Problems:   A-fib (HCC)   Atrial fibrillation with rapid ventricular response (HCC)   Acute diastolic CHF (congestive heart failure) [facility])   Essential hypertension    Consultations:  Cardiology  General surgery  Subjective:   Discharge Exam: Vitals:   08/07/19 0931 08/07/19 0933  BP:    Pulse:    Resp:    Temp:    SpO2: 94% 94%   Vitals:   08/07/19 0534 08/07/19 0813 08/07/19 0931 08/07/19 0933  BP: 117/67 115/71    Pulse: 75 78    Resp: 17     Temp: 98.2 F (36.8 C) 97.8 F (36.6 C)  TempSrc: Oral Oral    SpO2: 95% 98% 94% 94%  Weight:      Height:        General: Pt is alert, awake, not in  acute distress Cardiovascular: RRR, S1/S2 +, no rubs, no gallops Respiratory: CTA bilaterally, no wheezing, no rhonchi Abdominal: Soft, NT, ND, bowel sounds + Extremities: no edema, no cyanosis Abdominal wound noted-wound VAC in place  Discharge Instructions   Allergies as of 08/07/2019      Reactions   Propoxyphene Nausea And Vomiting      Medication List    STOP taking these medications   aspirin 81 MG tablet   cephALEXin 500 MG capsule Commonly known as: KEFLEX   hydrochlorothiazide 25 MG tablet Commonly known as: HYDRODIURIL   levofloxacin 500 MG tablet Commonly known as: LEVAQUIN   lisinopril 10 MG tablet Commonly known as: ZESTRIL   predniSONE 20 MG tablet Commonly known as: DELTASONE     TAKE these medications   chlorpheniramine-HYDROcodone 10-8 MG/5ML Suer Commonly known as: Tussionex Pennkinetic ER Take 5 mLs by mouth every 12 (twelve) hours as needed for cough.   diltiazem 120 MG 24 hr capsule Commonly known as: CARDIZEM CD Take 1 capsule (120 mg total) by mouth daily. Start taking on: August 08, 2019   enoxaparin 100 MG/ML injection Commonly known as: LOVENOX Inject 1 mL (100 mg total) into the skin every 12 (twelve) hours for 3 days.   folic acid 1 MG tablet Commonly known as: FOLVITE Take 1 mg by mouth daily.   gabapentin 300 MG capsule Commonly known as: NEURONTIN Take 300 mg by mouth 2 (two) times daily. Reported on 06/03/2015   HYDROcodone-acetaminophen 5-325 MG tablet Commonly known as: NORCO/VICODIN Take 1 tablet by mouth every 6 (six) hours as needed.   magnesium oxide 400 MG tablet Commonly known as: MAG-OX Take 400 mg by mouth daily.   methotrexate 2.5 MG tablet Take 12.5 mg by mouth once a week. tuesdays   sildenafil 20 MG tablet Commonly known as: REVATIO Reported on 06/03/2015   Ventolin HFA 108 (90 Base) MCG/ACT inhaler Generic drug: albuterol Inhale 1-2 puffs into the lungs every 4 (four) hours as needed.   warfarin 5  MG tablet Commonly known as: Coumadin Take 1 tablet (5 mg total) by mouth daily at 4 PM.            Durable Medical Equipment  (From admission, onward)         Start     Ordered   08/05/19 0818  For home use only DME Walker rolling  Once       Question Answer Comment  Walker: With [place]   Patient needs a walker to treat with the following condition Physical deconditioning      08/05/19 0817   08/04/19 1144  For home use only DME Negative pressure wound device  Once       Question Answer Comment  Frequency of dressing change 3 times per week   Length of need 3 Months   Dressing type Foam   Amount of suction 125 mm/Hg   Pressure application Continuous pressure   Supplies 10 canisters and 15 dressings per month for duration of therapy      08/04/19 1143          Follow-up Information    [facility] [place]. Go on 08/28/2019.   Specialty: General Surgery Why: 10am. Please arrive 30 minutes prior to your appointment for paperwork. Please bring a  copy of your photo ID and insurance card.  Contact information: [place] [address] [place], [facility] Follow up.   Specialty: Home Health Services Why: [facility] nursing/physical therapy Contact information: [place] [address] [phone]        [facility] Follow up on 08/11/2019.   Specialty: Cardiology Why: in our coumadin clinic for check  at 3:30 PM Contact information: [address] [place] [phone]       Elouise Munroe, MD Follow up on 08/26/2019.   Specialties: Cardiology, Radiology Why: at 4:20 PM   Contact information: [address] [place] [address] [phone]              Allergies  Allergen Reactions  . Propoxyphene Nausea And Vomiting    You were cared for by a hospitalist during your hospital stay. If you have any questions  about your discharge medications or the care you received while you were in the hospital after you are discharged, you can call the unit and asked to speak with the hospitalist on call if the hospitalist that took care of you is not available. Once you are discharged, your primary care physician will handle any further medical issues. Please note that no refills for any discharge medications will be authorized once you are discharged, as it is imperative that you return to your primary care physician (or establish a relationship with a primary care physician if you do not have one) for your aftercare needs so that they can reassess your need for medications and monitor your lab values.   Procedures/Studies: DG Abd 1 View  Result Date: 07/29/2019 CLINICAL DATA:  NG tube placement EXAM: ABDOMEN - 1 VIEW COMPARISON:  07/25/2019 FINDINGS: NG tube remains in stable position with the tip in the proximal stomach and the side port in the distal esophagus. Multiple dilated loops of small bowel again noted, unchanged. IMPRESSION: NG tube in stable position with the tip in the proximal stomach. Electronically Signed   By: Rolm Baptise M.D.   On: 07/29/2019 11:57   DG Abd 1 View  Result Date: 07/25/2019 CLINICAL DATA:  Check gastric catheter placement EXAM: ABDOMEN - 1 VIEW COMPARISON:  None. FINDINGS: Gastric catheter is noted with the tip in the stomach. The proximal side port lies in the distal esophagus and should be advanced several cm. This is similar to that seen on prior exam. Multiple dilated loops of small bowel are again noted. No free air is seen. IMPRESSION: Gastric catheter as described. This should be advanced further into the stomach. Electronically Signed   By: Inez Catalina M.D.   On: 07/25/2019 02:31   DG Abd 1 View  Result Date: 07/24/2019 CLINICAL DATA:  Small-bowel obstruction EXAM: ABDOMEN - 1 VIEW COMPARISON:  07/23/2019 FINDINGS: Interval improvement in small bowel dilatation since yesterday.  Colon is decompressed. Surgical clips in the rectum. NG tip in the gastric fundus. No acute skeletal abnormality. IMPRESSION: Interval improvement in dilated small bowel loops. NG tip in the gastric fundus. Electronically Signed   By: Franchot Gallo M.D.   On: 07/24/2019 10:11   DG Abdomen 1 View  Result Date: 07/22/2019 CLINICAL DATA:  NG tube placement EXAM: ABDOMEN - 1 VIEW COMPARISON:  None. FINDINGS: NG tube is in the fundus of the stomach. IMPRESSION: NG tube in the stomach. Electronically Signed   By: Lennette Bihari  [place] M.D.   On: 07/22/2019 21:23   CT ABDOMEN PELVIS W CONTRAST  Result Date: 07/22/2019 CLINICAL DATA:  Suspected bowel obstruction. Vomiting for 2 days. Hepatitis C. Hypertension. COPD. EXAM: CT ABDOMEN AND PELVIS WITH CONTRAST TECHNIQUE: Multidetector CT imaging of the abdomen and pelvis was performed using the standard protocol following bolus administration of intravenous contrast. CONTRAST:  132mL OMNIPAQUE IOHEXOL 300 MG/ML  SOLN COMPARISON:  12/28/2018 abdominal ultrasound from [place]. CT of 05/18/2016, report only available and reviewed. A CT from [place] of 06/24/2015 is reviewed. FINDINGS: Lower chest: Clear lung bases. Normal heart size without pericardial or pleural effusion. Right coronary artery atherosclerosis. Hepatobiliary: Mild cirrhosis, as evidenced by irregular hepatic capsule. No focal liver lesion. Normal gallbladder, without biliary ductal dilatation. Pancreas: Normal, without mass or ductal dilatation. Spleen: Normal in size, without focal abnormality. Adrenals/Urinary Tract: Normal adrenal glands. Mild renal cortical thinning bilaterally. Interpolar left renal 1.6 cm cyst. Left renal collecting system calculi of up to 4 mm. Normal urinary bladder. Stomach/Bowel: Normal stomach, without wall thickening. Surgical changes at the rectosigmoid junction. Normal caliber of the colon. Normal terminal ileum. Moderately distended proximal and mid small  bowel loops, fluid-filled. This continues to the level of a transition within the mid ileum, including on 58/2. There may be a second adjacent transition identified just lateral to this. minimal edema within subtending mesentery of dilated bowel loops, including on 62/2. Vascular/Lymphatic: Aortic and branch vessel atherosclerosis. Infrarenal aortic dilatation of maximally 3.1 cm. No abdominopelvic adenopathy. Reproductive: Normal prostate. Other: Trace fluid within the cul-de-sac, including on 74/2. No free intraperitoneal air. Musculoskeletal: Moderate compression deformity involving the L1 vertebral body, described on 05/29/2019 radiograph. IMPRESSION: 1. Mid to distal small bowel obstruction, presumably secondary to adhesions. Possible second adjacent transition point, as can be seen with close loop obstruction. Consider surgical consultation. 2. Although there are no specific signs of complicating ischemia, there is trace pelvic fluid and edema/fluid within the subtending small-bowel mesentery. 3. Left nephrolithiasis. 4. Coronary artery atherosclerosis. Aortic Atherosclerosis (ICD10-I70.0). 5. Mild cirrhosis. Electronically Signed   By: Abigail Miyamoto M.D.   On: 07/22/2019 20:20   DG Chest Port 1 View  Result Date: 07/29/2019 CLINICAL DATA:  Shortness of breath EXAM: PORTABLE CHEST 1 VIEW COMPARISON:  07/28/2019 FINDINGS: Cardiac shadow is stable. Left-sided PICC line is again noted at the cavoatrial junction. Gastric catheter has been removed in the interval. Improved aeration is noted in the bases bilaterally. No new focal confluent infiltrate is seen. No bony abnormality is noted. IMPRESSION: Improved aeration in the bases.  No acute abnormality noted. Electronically Signed   By: Inez Catalina M.D.   On: 07/29/2019 08:23   DG CHEST PORT 1 VIEW  Result Date: 07/28/2019 CLINICAL DATA:  Central venous catheter placement EXAM: PORTABLE CHEST 1 VIEW COMPARISON:  July 28, 2019 study obtained earlier in the day  FINDINGS: Central catheter tip is at the cavoatrial junction. Nasogastric tube tip and side port are in the stomach. No pneumothorax. There is airspace consolidation in the lung bases, more severe on the right than on the left, stable. No new opacity evident. Heart size and pulmonary vascularity are normal. No adenopathy. There is aortic atherosclerosis. No bone lesions. IMPRESSION: Tube and catheter positions as described without pneumothorax. Bibasilar consolidation, more severe on the right than the left, stable. Cardiac silhouette within normal limits. Aortic Atherosclerosis (ICD10-I70.0). Electronically Signed   By: Lowella Grip III M.D.   On: 07/28/2019 19:20  DG CHEST PORT 1 VIEW  Result Date: 07/28/2019 CLINICAL DATA:  Central venous catheter placement EXAM: PORTABLE CHEST 1 VIEW COMPARISON:  07/28/2019 FINDINGS: Single frontal view of the chest demonstrates left-sided PICC tip overlying superior vena cava. Enteric catheter passes below diaphragm side port projecting over gastric fundus. Tip is excluded by collimation. Cardiac silhouette is stable. There is persistent central vascular congestion with bibasilar consolidation, right greater than left. No effusion or pneumothorax. IMPRESSION: 1. Support devices as above. 2. Persistent central vascular congestion and bibasilar consolidation, stable. Electronically Signed   By: Randa Ngo M.D.   On: 07/28/2019 16:54   DG CHEST PORT 1 VIEW  Result Date: 07/28/2019 CLINICAL DATA:  Shortness of breath, cough EXAM: PORTABLE CHEST 1 VIEW COMPARISON:  Radiograph 07/27/2018 FINDINGS: Transesophageal tube tip distal to the GE junction, terminating below the level of imaging. Telemetry leads overlie the chest. Lung volumes are low with some streaky basilar opacities favoring atelectasis. Increasing opacity in the right lung base could reflect worsening atelectasis or developing airspace disease. Central vascular crowding is noted. No convincing features of  edema. The aorta is calcified. The remaining cardiomediastinal contours are unremarkable. No acute osseous or soft tissue abnormality. Degenerative changes are present in the imaged spine and shoulders. IMPRESSION: Transesophageal tube tip below the GE junction. Low lung volumes with some streaky basilar opacities favoring atelectasis. More patchy increased opacity in the right lung base could reflect further volume loss or developing airspace disease. Electronically Signed   By: Lovena Le M.D.   On: 07/28/2019 04:08   DG Chest Port 1 View  Result Date: 07/27/2019 CLINICAL DATA:  Dyspnea, post exploratory laparotomy, small-bowel resection, and enterolysis; past history hypertension, COPD, atrial fibrillation EXAM: PORTABLE CHEST 1 VIEW COMPARISON:  Portable exam 0758 hours compared to 05/29/2019 FINDINGS: Nasogastric tube extends into abdomen. Upper normal heart size. Mediastinal contours and pulmonary vascularity normal. EKG leads project over chest. Decreased lung volumes with bibasilar atelectasis. No definite infiltrate, pleural effusion or pneumothorax. IMPRESSION: Decreased lung volumes with bibasilar atelectasis. Electronically Signed   By: Lavonia Dana M.D.   On: 07/27/2019 10:11   DG Abd Portable 1V-Small Bowel Obstruction Protocol-initial, 8 hr delay  Result Date: 07/23/2019 CLINICAL DATA:  Check small-bowel obstruction EXAM: PORTABLE ABDOMEN - 1 VIEW COMPARISON:  Film from earlier in the same day. FINDINGS: Contrast material remains in the bladder from prior CT examination. Multiple dilated loops of small bowel are noted. Contrast from the stomach now lies predominately within the jejunum. No definitive colonic contrast is seen. Continued follow-up is recommended. IMPRESSION: Persistent small-bowel obstruction with contrast material throughout the jejunum. No definitive colonic contrast is seen at this time. Continued follow-up is recommended. Electronically Signed   By: Inez Catalina M.D.   On:  07/23/2019 20:24   DG Abd Portable 1V-Small Bowel Protocol-Position Verification  Result Date: 07/23/2019 CLINICAL DATA:  NG tube placement.  Small bowel obstruction. EXAM: PORTABLE ABDOMEN - 1 VIEW COMPARISON:  CT abdomen and pelvis 07/22/2019. FINDINGS: NG tube is in place with sideport just proximal to the EG junction. Recommend advancement of 3-4 cm. No radio-opaque calculi or other significant radiographic abnormality are seen. IMPRESSION: NG tube sideport is at the gastroesophageal junction. Recommend advancement of 3-4 cm. Electronically Signed   By: Inge Rise M.D.   On: 07/23/2019 13:09   ECHOCARDIOGRAM COMPLETE  Result Date: 07/23/2019    ECHOCARDIOGRAM REPORT   Patient Name:   KAVEON BLATZ Date of Exam: 07/23/2019 Medical Rec #:  [id number]  Height:       73.0 in Accession #:    [id number]    Weight:       203.3 lb Date of Birth:  01-20-1946    BSA:          2.166 m Patient Age:    17 years      BP:           89/54 mmHg Patient Gender: M             HR:           105 bpm. Exam Location:  Inpatient Procedure: 2D Echo, Cardiac Doppler and Color Doppler Indications:    Atrial Fibrillation 427.31 / I48.91  History:        Patient has no prior history of Echocardiogram examinations.                 Arrythmias:Atrial Fibrillation; Risk Factors:Current Smoker.  Sonographer:    Vickie Epley RDCS Referring Phys: [id number] RAVI [place]  1. Left ventricular ejection fraction, by estimation, is 55 to 60%. The left ventricle has normal function. The left ventricle has no regional wall motion abnormalities. Left ventricular diastolic parameters are indeterminate.  2. Right ventricular systolic function is normal. The right ventricular size is mildly enlarged. There is mildly elevated pulmonary artery systolic pressure. The estimated right ventricular systolic pressure is 01.7 mmHg.  3. The mitral valve is normal in structure. Trivial mitral valve regurgitation. No evidence of mitral stenosis.  4.  The aortic valve is tricuspid. Aortic valve regurgitation is not visualized. No aortic stenosis is present.  5. The inferior vena cava is normal in size with greater than 50% respiratory variability, suggesting right atrial pressure of 3 mmHg.  6. The patient was in atrial fibrillation. FINDINGS  Left Ventricle: Left ventricular ejection fraction, by estimation, is 55 to 60%. The left ventricle has normal function. The left ventricle has no regional wall motion abnormalities. The left ventricular internal cavity size was normal in size. There is  no left ventricular hypertrophy. Left ventricular diastolic parameters are indeterminate. Right Ventricle: The right ventricular size is mildly enlarged. No increase in right ventricular wall thickness. Right ventricular systolic function is normal. There is mildly elevated pulmonary artery systolic pressure. The tricuspid regurgitant velocity is 2.90 m/s, and with an assumed right atrial pressure of 3 mmHg, the estimated right ventricular systolic pressure is 51.0 mmHg. Left Atrium: Left atrial size was normal in size. Right Atrium: Right atrial size was normal in size. Pericardium: There is no evidence of pericardial effusion. Mitral Valve: The mitral valve is normal in structure. Trivial mitral valve regurgitation. No evidence of mitral valve stenosis. Tricuspid Valve: The tricuspid valve is normal in structure. Tricuspid valve regurgitation is trivial. Aortic Valve: The aortic valve is tricuspid. Aortic valve regurgitation is not visualized. No aortic stenosis is present. Pulmonic Valve: The pulmonic valve was normal in structure. Pulmonic valve regurgitation is not visualized. Aorta: The aortic root is normal in size and structure. Venous: The inferior vena cava is normal in size with greater than 50% respiratory variability, suggesting right atrial pressure of 3 mmHg. IAS/Shunts: No atrial level shunt detected by color flow Doppler.  LEFT VENTRICLE PLAX 2D LVIDd:          4.60 cm LVIDs:         4.20 cm LV PW:         0.90 cm LV IVS:        0.90 cm LVOT  diam:     1.90 cm LV SV:         41 LV SV Index:   19 LVOT Area:     2.84 cm  LV Volumes (MOD) LV vol d, MOD A2C: 67.5 ml LV vol d, MOD A4C: 82.3 ml LV vol s, MOD A2C: 38.7 ml LV vol s, MOD A4C: 43.3 ml LV SV MOD A2C:     28.8 ml LV SV MOD A4C:     82.3 ml LV SV MOD BP:      32.8 ml RIGHT VENTRICLE TAPSE (M-mode): 1.7 cm LEFT ATRIUM             Index       RIGHT ATRIUM           Index LA diam:        4.00 cm 1.85 cm/m  RA Area:     16.40 cm LA Vol (A2C):   28.6 ml 13.20 ml/m RA Volume:   38.90 ml  17.96 ml/m LA Vol (A4C):   39.8 ml 18.37 ml/m LA Biplane Vol: 35.2 ml 16.25 ml/m  AORTIC VALVE LVOT Vmax:   84.20 cm/s LVOT Vmean:  53.600 cm/s LVOT VTI:    0.145 m  AORTA Ao Root diam: 3.50 cm TRICUSPID VALVE TR Peak grad:   33.6 mmHg TR Vmax:        290.00 cm/s  SHUNTS Systemic VTI:  0.14 m Systemic Diam: 1.90 cm Loralie Champagne MD Electronically signed by Loralie Champagne MD Signature Date/Time: 07/23/2019/6:59:20 PM    Final    [place] EKG SITE RITE  Result Date: 07/28/2019 If Site Rite image not attached, placement could not be confirmed due to current cardiac rhythm.    The results of significant diagnostics from this hospitalization (including imaging, microbiology, ancillary and laboratory) are listed below for reference.     Microbiology: No results found for this or any previous visit (from the past 240 hour(s)).   Labs: BNP (last 3 results) Recent Labs    07/27/19 0751 07/29/19 0308  BNP 269.2* 469.6*   Basic Metabolic Panel: Recent Labs  Lab 08/01/19 0232 08/01/19 0232 08/02/19 0737 08/02/19 0737 08/03/19 0438 08/04/19 0426 08/05/19 0420 08/06/19 0549 08/07/19 0527  NA 137   < > 133*   < > 134* 133* 133* 132* 131*  K 3.3*   < > 4.4   < > 4.9 4.4 4.1 3.9 4.2  CL 93*   < > 100   < > 102 100 100 99 96*  CO2 32   < > 26   < > 24 24 25 24 24   GLUCOSE 142*   < > 111*   < > 131* 108* 103* 107* 113*   BUN 40*   < > 32*   < > 29* 26* 29* 26* 27*  CREATININE 0.92   < > 0.73   < > 0.74 0.82 0.81 0.70 1.01  CALCIUM 8.2*   < > 8.3*   < > 8.4* 8.3* 7.8* 8.0* 7.8*  MG 1.9   < > 2.0   < > 1.8 1.6* 1.9 1.7 1.8  PHOS 4.0  --  2.8  --   --   --   --   --   --    < > = values in this interval not displayed.   Liver Function Tests: No results for input(s): AST, ALT, ALKPHOS, BILITOT, PROT, ALBUMIN in the last 168 hours. No results for input(s): LIPASE, AMYLASE in the  last 168 hours. No results for input(s): AMMONIA in the last 168 hours. CBC: Recent Labs  Lab 08/02/19 0737 08/02/19 0737 08/03/19 0438 08/04/19 0426 08/05/19 0420 08/05/19 0820 08/06/19 0549  WBC 8.8  --  11.3* 9.3 6.3  --  4.7  HGB 10.6*  --  10.7* 9.9* 9.6*  --  9.6*  HCT 32.4*   < > 32.6* 30.0* 28.8* 28.6* 28.6*  MCV 106.9*  --  107.9* 106.4* 107.1*  --  105.1*  PLT 148*  --  176 180 201  --  244   < > = values in this interval not displayed.   Cardiac Enzymes: No results for input(s): CKTOTAL, CKMB, CKMBINDEX, TROPONINI in the last 168 hours. BNP: Invalid input(s): POCBNP CBG: Recent Labs  Lab 08/06/19 0031 08/06/19 0734 08/06/19 1613 08/07/19 0056 08/07/19 0740  GLUCAP 89 97 124* 109* 97   D-Dimer No results for input(s): DDIMER in the last 72 hours. Hgb A1c No results for input(s): HGBA1C in the last 72 hours. Lipid Profile No results for input(s): CHOL, HDL, LDLCALC, TRIG, CHOLHDL, LDLDIRECT in the last 72 hours. Thyroid function studies Recent Labs    08/05/19 0820  TSH 2.812   Anemia work up Recent Labs    08/05/19 0820  VITAMINB12 359   Urinalysis    Component Value Date/Time   COLORURINE YELLOW 07/22/2019 [place] 07/22/2019 1854   LABSPEC 1.020 07/22/2019 1854   PHURINE 5.0 07/22/2019 [place] 07/22/2019 [place] 07/22/2019 [place] (A) 07/22/2019 [place] 07/22/2019 [place]  07/22/2019 1854   NITRITE NEGATIVE 07/22/2019 1854   LEUKOCYTESUR NEGATIVE 07/22/2019 1854   Sepsis Labs Invalid input(s): PROCALCITONIN,  WBC,  LACTICIDVEN Microbiology No results found for this or any previous visit (from the past 240 hour(s)).   Time coordinating discharge:  I have spent 35 minutes face to face with the patient and on the ward discussing the patients care, assessment, plan and disposition with other care givers. >50% of the time was devoted counseling the patient about the risks and benefits of treatment/Discharge disposition and coordinating care.   SIGNED:   Damita Lack, MD  [facility] 08/07/2019, 11:25 AM   If 7PM-7AM, please contact night-coverage

## 2019-08-07 NOTE — Progress Notes (Signed)
Physical Therapy Treatment Patient Details Name: Jake [name] MRN: [id number] DOB: 08-12-1945 Today's Date: 08/07/2019    History of Present Illness Pt admitted with SBO and now s/p Exploratory Laparotomy and Enterolysis Decompressive Enterotomy and Small Bowel Anastomosis. post op afib with RVR, potential HCAP.PMhx of COPD, PAF, RA, AAA, and back surgery. Placement of wound VAC to abdmen    PT Comments    Pt assisted with ambulating in hallway.  Mildly unsteady however no overt LOB observed.  Continue to recommend HHPT and 24/7 supervision for safety.  Pt reports d/c home today.    Follow Up Recommendations  Home health PT;Supervision/Assistance - 24 hour     Equipment Recommendations  None recommended by PT    Recommendations for Other Services       Precautions / Restrictions Precautions Precautions: Fall Precaution Comments: NPWT Restrictions Weight Bearing Restrictions: No    Mobility  Bed Mobility Overal bed mobility: Modified Independent                Transfers Overall transfer level: Needs assistance Equipment used: Rolling walker (2 wheeled) Transfers: Sit to/from Stand Sit to Stand: Min guard         General transfer comment: min/guard for safety, assist for NPWT  Ambulation/Gait Ambulation/Gait assistance: Min guard Gait Distance (Feet): 80 Feet Assistive device: Rolling walker (2 wheeled) Gait Pattern/deviations: Decreased stride length;Step-through pattern;Narrow base of support     General Gait Details: min/guard for safety, assist for NPWT, mild unsteadiness however no overt LOB or assist required   Stairs             Wheelchair Mobility    Modified Rankin (Stroke Patients Only)       Balance           Standing balance support: No upper extremity supported Standing balance-Leahy Scale: Fair Standing balance comment: static fair, dynamic poor                            Cognition Arousal/Alertness:  Awake/alert Behavior During Therapy: WFL for tasks assessed/performed Overall Cognitive Status: Within Functional Limits for tasks assessed                                        Exercises      General Comments        Pertinent Vitals/Pain Pain Assessment: No/denies pain    Home Living                      Prior Function            PT Goals (current goals can now be found in the care plan section) Progress towards PT goals: Progressing toward goals    Frequency    Min 3X/week      PT Plan Current plan remains appropriate    Co-evaluation              AM-PAC PT "6 Clicks" Mobility   Outcome Measure  Help needed turning from your back to your side while in a flat bed without using bedrails?: None Help needed moving from lying on your back to sitting on the side of a flat bed without using bedrails?: None Help needed moving to and from a bed to a chair (including a wheelchair)?: A Little Help needed standing up from a chair using your arms (  e.g., wheelchair or bedside chair)?: A Little Help needed to walk in hospital room?: A Little Help needed climbing 3-5 steps with a railing? : A Little 6 Click Score: 20    End of Session Equipment Utilized During Treatment: Gait belt Activity Tolerance: Patient tolerated treatment well Patient left: Other (comment);with call bell/phone within reach [facility] with call bell in reach, pt agreeable to use when finished)   PT Visit Diagnosis: Muscle weakness (generalized) (M62.81);Difficulty in walking, not elsewhere classified (R26.2)     Time: 6386-8548 PT Time Calculation (min) (ACUTE ONLY): 20 min  Charges:  $Gait Training: 8-22 mins                    Arlyce Dice, DPT Acute Rehabilitation Services Pager: [phone] Office: [phone]  [place] Ram E 08/07/2019, 11:33 AM

## 2019-08-07 NOTE — Progress Notes (Addendum)
Progress Note  Patient Name: Jake [name] Date of Encounter: 08/07/2019  [facility] Cardiologist: Elouise Munroe, MD   Subjective   Feeling better, hopes to go home today  Inpatient Medications    Scheduled Meds: . budesonide (PULMICORT) nebulizer solution  0.5 mg Nebulization BID  . diltiazem  120 mg Oral Daily  . enoxaparin (LOVENOX) injection  1 mg/kg Subcutaneous Q12H  . hydrochlorothiazide  25 mg Oral Daily  . polyethylene glycol  17 g Oral BID  . Warfarin - Pharmacist Dosing Inpatient   Does not apply q1600   Continuous Infusions:  PRN Meds: acetaminophen, guaiFENesin-dextromethorphan, HYDROmorphone (DILAUDID) injection, ipratropium, labetalol, levalbuterol, methocarbamol, ondansetron **OR** ondansetron (ZOFRAN) IV, oxyCODONE, silver nitrate applicators   Vital Signs    Vitals:   08/07/19 0534 08/07/19 0813 08/07/19 0931 08/07/19 0933  BP: 117/67 115/71    Pulse: 75 78    Resp: 17     Temp: 98.2 F (36.8 C) 97.8 F (36.6 C)    TempSrc: Oral Oral    SpO2: 95% 98% 94% 94%  Weight:      Height:        Intake/Output Summary (Last 24 hours) at 08/07/2019 0937 Last data filed at 08/07/2019 0928 Gross per 24 hour  Intake 1080 ml  Output 1390 ml  Net -310 ml   Last 3 Weights 08/04/2019 07/22/2019 07/22/2019  Weight (lbs) 204 lb 2.3 oz 203 lb 4.2 oz 210 lb  Weight (kg) 92.6 kg 92.2 kg 95.255 kg      Telemetry    Afib, rate ok, 7 bt run NSVT, PVCs and pairs- Personally Reviewed  ECG    No new - Personally Reviewed  Physical Exam   GEN: No acute distress.   Neck: No JVD Cardiac: RRR, no murmur, no rubs, or gallops.  Respiratory: diminished to auscultation bilaterally  GI: Soft, tender, non-distended  MS: No edema; No deformity. Neuro:  Nonfocal  Psych: Normal affect   Labs    High Sensitivity Troponin:   Recent Labs  Lab 07/23/19 0300 07/31/19 2138  TROPONINIHS 24* 27*      Chemistry Recent Labs  Lab 08/05/19 0420 08/06/19 0549  08/07/19 0527  NA 133* 132* 131*  K 4.1 3.9 4.2  CL 100 99 96*  CO2 25 24 24   GLUCOSE 103* 107* 113*  BUN 29* 26* 27*  CREATININE 0.81 0.70 1.01  CALCIUM 7.8* 8.0* 7.8*  GFRNONAA >60 >60 >60  GFRAA >60 >60 >60  ANIONGAP 8 9 11      Hematology Recent Labs  Lab 08/04/19 0426 08/04/19 0426 08/05/19 0420 08/05/19 0820 08/06/19 0549  WBC 9.3  --  6.3  --  4.7  RBC 2.82*  --  2.69*  --  2.72*  HGB 9.9*  --  9.6*  --  9.6*  HCT 30.0*   < > 28.8* 28.6* 28.6*  MCV 106.4*  --  107.1*  --  105.1*  MCH 35.1*  --  35.7*  --  35.3*  MCHC 33.0  --  33.3  --  33.6  RDW 13.8  --  13.9  --  14.0  PLT 180  --  201  --  244   < > = values in this interval not displayed.    BNPNo results for input(s): BNP, PROBNP in the last 168 hours.   Lab Results  Component Value Date   INR 1.3 (H) 08/07/2019   INR 1.2 08/06/2019   INR 1.2 08/05/2019  Radiology    No results found.  Cardiac Studies   ECHO: 07/22/2019 1. Left ventricular ejection fraction, by estimation, is 55 to 60%. The  left ventricle has normal function. The left ventricle has no regional  wall motion abnormalities. Left ventricular diastolic parameters are  indeterminate.  2. Right ventricular systolic function is normal. The right ventricular  size is mildly enlarged. There is mildly elevated pulmonary artery  systolic pressure. The estimated right ventricular systolic pressure is  50.3 mmHg.  3. The mitral valve is normal in structure. Trivial mitral valve  regurgitation. No evidence of mitral stenosis.  4. The aortic valve is tricuspid. Aortic valve regurgitation is not  visualized. No aortic stenosis is present.  5. The inferior vena cava is normal in size with greater than 50%  respiratory variability, suggesting right atrial pressure of 3 mmHg.  6. The patient was in atrial fibrillation.    Patient Profile     74 y.o.malew/ hx PAF not on anticoag, AAA w/out rupture (3.0 cm 12/2018), HTN,  COPD, RA, hep A,who was admitted 06/02 with SBO, NG tube placed and subsequently removed, failed medical management and required exlap/enterolysis on 07/25/19.   Assessment & Plan     1. SBO, mechanical - s/p surgery 06/04 - per [facility], IM - improving, D/C today  2. Afib, rates controlled - Dilt 180 mg >>120 mg due to soft BP and bradycardia - on lovnox>>coumadin, will make coumadin and f/u appts  3. HTN - - has been on home HCTZ, will d/c. Restart as outpt as BP, volume - feel rate control rx more important     For questions or updates, please contact [place] Please consult [email or web address] for contact info under        Signed, Rosaria Ferries, PA-C  08/07/2019, 9:37 AM    Patient seen and examined with Rosaria Ferries PA-C.  Agree as above, with the following exceptions and changes as noted below. Feels well, rates better controlled. Gen: NAD, CV: iRRR, no murmurs, Lungs: clear, Abd: soft, Extrem: Warm, well perfused, no edema, Neuro/Psych: alert and oriented x 3, normal mood and affect. All available labs, radiology testing, previous records reviewed. Continue rate control as above, will arrange outpatient follow up. Recommend Lovenox for bridging given subtherapeutic INR.  Elouise Munroe, MD

## 2019-08-18 ENCOUNTER — Ambulatory Visit (INDEPENDENT_AMBULATORY_CARE_PROVIDER_SITE_OTHER): Payer: Medicare Other | Admitting: Pharmacist

## 2019-08-18 ENCOUNTER — Other Ambulatory Visit: Payer: Self-pay

## 2019-08-18 DIAGNOSIS — I4891 Unspecified atrial fibrillation: Secondary | ICD-10-CM | POA: Diagnosis not present

## 2019-08-18 DIAGNOSIS — Z7901 Long term (current) use of anticoagulants: Secondary | ICD-10-CM | POA: Diagnosis not present

## 2019-08-18 LAB — POCT INR: INR: 1.1 — AB (ref 2.0–3.0)

## 2019-08-18 NOTE — Patient Instructions (Signed)
Take 10mg  daily except 5mg  every Friday. Repeat INR in 1 week.

## 2019-08-26 ENCOUNTER — Ambulatory Visit (INDEPENDENT_AMBULATORY_CARE_PROVIDER_SITE_OTHER): Payer: Medicare Other | Admitting: Internal Medicine

## 2019-08-26 ENCOUNTER — Other Ambulatory Visit: Payer: Self-pay

## 2019-08-26 ENCOUNTER — Encounter: Payer: Self-pay | Admitting: Internal Medicine

## 2019-08-26 ENCOUNTER — Ambulatory Visit (INDEPENDENT_AMBULATORY_CARE_PROVIDER_SITE_OTHER): Payer: Medicare Other | Admitting: Pharmacist

## 2019-08-26 VITALS — BP 115/68 | HR 81 | Ht 73.0 in | Wt 194.2 lb

## 2019-08-26 DIAGNOSIS — I4891 Unspecified atrial fibrillation: Secondary | ICD-10-CM

## 2019-08-26 DIAGNOSIS — Z7901 Long term (current) use of anticoagulants: Secondary | ICD-10-CM | POA: Diagnosis not present

## 2019-08-26 DIAGNOSIS — I10 Essential (primary) hypertension: Secondary | ICD-10-CM | POA: Diagnosis not present

## 2019-08-26 DIAGNOSIS — I5031 Acute diastolic (congestive) heart failure: Secondary | ICD-10-CM | POA: Diagnosis not present

## 2019-08-26 DIAGNOSIS — I4819 Other persistent atrial fibrillation: Secondary | ICD-10-CM | POA: Diagnosis not present

## 2019-08-26 DIAGNOSIS — K56609 Unspecified intestinal obstruction, unspecified as to partial versus complete obstruction: Secondary | ICD-10-CM

## 2019-08-26 LAB — POCT INR: INR: 1.4 — AB (ref 2.0–3.0)

## 2019-08-26 NOTE — Progress Notes (Signed)
Cardiology Office Note:    Date:  08/26/2019   ID:  Jake [name], DOB 1945-12-13, MRN [id number]  PCP:  Glendon Axe, MD  Cardiologist:  Elouise Munroe, MD  Electrophysiologist:  None   Referring MD: Glendon Axe, MD   Chief Complaint: f/u afib  History of Present Illness:    Jake [name] is a 74 y.o. male with a history of hypertension, rheumatoid arthritis, AAA without rupture, hepatitis C, COPD recently hospitalized for small bowel obstruction, with failed conservative management therefore surgical intervention was required of 6/4.  Hospital course also complicated by atrial fibrillation with RVR, started on Cardizem 120 mg daily and anticoagulated with Lovenox bridge to Coumadin.  Eventually his hydrochlorothiazide was discontinued.   Overall he feels well today.  Continues to have wound VAC in place over abdominal incision.  No chest pain, shortness of breath, palpitations.  Remains in atrial fibrillation with controlled ventricular response.  He is tolerating Cardizem 120 mg daily well.  No other antihypertensive therapy.  His daughter Jake [name] joins him for the visit today.  She notes that her mother/patient's wife was just hospitalized with a large stroke at [facility].  Past Medical History:  Diagnosis Date  . AAA (abdominal aortic aneurysm) without rupture ([place])   . COPD (chronic obstructive pulmonary disease) ([place])   . Hepatitis A   . Hypertension   . PAF (paroxysmal atrial fibrillation) ([place])   . Rheumatoid arthritis [facility])     Past Surgical History:  Procedure Laterality Date  . BACK SURGERY    . FRACTURE SURGERY    . JOINT REPLACEMENT    . LAPAROTOMY N/A 07/25/2019   Procedure: EXPLORATORY LAPAROTOMY,REPAIR AND ANASTOMOSIS OF SMALL BOWEL, ENTEROLYSIS;  Surgeon: [name], MD;  Location: [facility];  Service: General;  Laterality: N/A;    Current Medications: Current Meds  Medication Sig  . chlorpheniramine-HYDROcodone (TUSSIONEX PENNKINETIC ER) 10-8  MG/5ML SUER Take 5 mLs by mouth every 12 (twelve) hours as needed for cough.  . diltiazem (CARDIZEM CD) 120 MG 24 hr capsule Take 1 capsule (120 mg total) by mouth daily.  . folic acid (FOLVITE) 1 MG tablet Take 1 mg by mouth daily.  . magnesium oxide (MAG-OX) 400 MG tablet Take 400 mg by mouth daily.   . methotrexate 2.5 MG tablet Take 12.5 mg by mouth once a week. tuesdays  . sildenafil (REVATIO) 20 MG tablet Reported on 06/03/2015  . VENTOLIN HFA 108 (90 Base) MCG/ACT inhaler Inhale 1-2 puffs into the lungs every 4 (four) hours as needed.   . warfarin (COUMADIN) 5 MG tablet Take 1 tablet (5 mg total) by mouth daily at 4 PM.  . [DISCONTINUED] enoxaparin (LOVENOX) 100 MG/ML injection Inject 1 mL (100 mg total) into the skin every 12 (twelve) hours for 3 days.     Allergies:   Propoxyphene   Social History   Socioeconomic History  . Marital status: Married    Spouse name: Not on file  . Number of children: Not on file  . Years of education: Not on file  . Highest education level: Not on file  Occupational History  . Not on file  Tobacco Use  . Smoking status: Current Every Day Smoker  . Smokeless tobacco: Never Used  Vaping Use  . Vaping Use: Never used  Substance and Sexual Activity  . Alcohol use: Yes    Alcohol/week: 0.0 standard drinks    Comment: 3 beers weekly  . Drug use: No  . Sexual activity: Not on  file  Other Topics Concern  . Not on file  Social History Narrative  . Not on file   Social Determinants of Health   Financial Resource Strain:   . Difficulty of Paying Living Expenses:   Food Insecurity:   . Worried About Charity fundraiser in the Last Year:   . Arboriculturist in the Last Year:   Transportation Needs:   . Film/video editor (Medical):   Marland Kitchen Lack of Transportation (Non-Medical):   Physical Activity:   . Days of Exercise per Week:   . Minutes of Exercise per Session:   Stress:   . Feeling of Stress :   Social Connections:   . Frequency of  Communication with Friends and Family:   . Frequency of Social Gatherings with Friends and Family:   . Attends Religious Services:   . Active Member of Clubs or Organizations:   . Attends Archivist Meetings:   Marland Kitchen Marital Status:      Family History: The patient's Family history is unknown by patient.  ROS:   Please see the history of present illness.    All other systems reviewed and are negative.  EKGs/Labs/Other Studies Reviewed:    The following studies were reviewed today:  EKG:  Afib rate 84   Recent Labs: 07/29/2019: B Natriuretic Peptide 378.9 07/31/2019: ALT 30 08/05/2019: TSH 2.812 08/06/2019: Hemoglobin 9.6; Platelets 244 08/07/2019: BUN 27; Creatinine, Ser 1.01; Magnesium 1.8; Potassium 4.2; Sodium 131  Recent Lipid Panel    Component Value Date/Time   TRIG 107 07/29/2019 0304    Physical Exam:    VS:  BP 115/68   Pulse 81   Ht 6\' 1"  (1.854 m)   Wt 194 lb 3.2 oz (88.1 kg)   SpO2 97%   BMI 25.62 kg/m     Wt Readings from Last 5 Encounters:  08/26/19 194 lb 3.2 oz (88.1 kg)  08/04/19 204 lb 2.3 oz (92.6 kg)  01/15/19 220 lb (99.8 kg)  03/10/17 219 lb (99.3 kg)  06/03/15 238 lb (108 kg)     Constitutional: No acute distress Eyes: sclera non-icteric, normal conjunctiva and lids ENMT: Mask in place Cardiovascular: Irregular rhythm, normal rate, no murmurs. S1 and S2 normal. Radial pulses normal bilaterally. No jugular venous distention.  Respiratory: clear to auscultation bilaterally GI : Wound VAC in place.   MSK: extremities warm, well perfused. No edema.  NEURO: grossly nonfocal exam, moves all extremities. PSYCH: alert and oriented x 3, normal mood and affect.   ASSESSMENT:    1. Persistent atrial fibrillation ([place])   2. Long term (current) use of anticoagulants   3. Acute diastolic CHF (congestive heart failure) ([place])   4. Essential hypertension   5. SBO (small bowel obstruction) (HCC)    PLAN:    Persistent atrial fibrillation  ([place]) - Plan: EKG 12-Lead Long term (current) use of anticoagulants -He is tolerating Cardizem 120 mg daily well without hypotension.  Atrial fibrillation rates are well controlled.  Continues on Coumadin with subtherapeutic INR of 1.4 today.  He was seen by Coumadin clinic pharmacist just prior to our visit with plans for increased dose of oral anticoagulation.  Acute diastolic CHF (congestive heart failure) (HCC)-appears euvolemic without significant symptoms.  Essential hypertension-on Cardizem 120 mg daily.  HCTZ was stopped in the hospital due to hypotension.  Blood pressure stable today.  SBO (small bowel obstruction) (HCC)-wound VAC in place, management per surgery.  Total time of encounter: 30 minutes total  time of encounter, including 20 minutes spent in face-to-face patient care on the date of this encounter. This time includes coordination of care and counseling regarding above mentioned problem list. Remainder of non-face-to-face time involved reviewing chart documents/testing relevant to the patient encounter and documentation in the medical record. I have independently reviewed documentation from referring provider.   Cherlynn Kaiser, MD   [facility]    Medication Adjustments/Labs and Tests Ordered: Current medicines are reviewed at length with the patient today.  Concerns regarding medicines are outlined above.  Orders Placed This Encounter  Procedures  . EKG 12-Lead   No orders of the defined types were placed in this encounter.   Patient Instructions  Medication Instructions:  Your Physician recommend you continue on your current medication as directed.    *If you need a refill on your cardiac medications before your next appointment, please call your pharmacy*   Lab Work: None   Testing/Procedures: None   Follow-Up: At [facility], you and your health needs are our priority.  As part of our continuing mission to provide you with  exceptional heart care, we have created designated Provider Care Teams.  These Care Teams include your primary Cardiologist (physician) and Advanced Practice Providers (APPs -  Physician Assistants and Nurse Practitioners) who all work together to provide you with the care you need, when you need it.  We recommend signing up for the patient portal called "MyChart".  Sign up information is provided on this After Visit Summary.  MyChart is used to connect with patients for Virtual Visits (Telemedicine).  Patients are able to view lab/test results, encounter notes, upcoming appointments, etc.  Non-urgent messages can be sent to your provider as well.   To learn more about what you can do with MyChart, go to [email or web address].    Your next appointment:   3 month(s)  The format for your next appointment:   In Person  Provider:   Cherlynn Kaiser, MD

## 2019-08-26 NOTE — Patient Instructions (Signed)
Medication Instructions:  Your Physician recommend you continue on your current medication as directed.    *If you need a refill on your cardiac medications before your next appointment, please call your pharmacy*   Lab Work: None   Testing/Procedures: None   Follow-Up: At [facility], you and your health needs are our priority.  As part of our continuing mission to provide you with exceptional heart care, we have created designated Provider Care Teams.  These Care Teams include your primary Cardiologist (physician) and Advanced Practice Providers (APPs -  Physician Assistants and Nurse Practitioners) who all work together to provide you with the care you need, when you need it.  We recommend signing up for the patient portal called "MyChart".  Sign up information is provided on this After Visit Summary.  MyChart is used to connect with patients for Virtual Visits (Telemedicine).  Patients are able to view lab/test results, encounter notes, upcoming appointments, etc.  Non-urgent messages can be sent to your provider as well.   To learn more about what you can do with MyChart, go to [email or web address].    Your next appointment:   3 month(s)  The format for your next appointment:   In Person  Provider:   Cherlynn Kaiser, MD

## 2019-08-26 NOTE — Patient Instructions (Addendum)
Description   Take 3 tablets today and then take 2 tablets daily.  Recheck INR in 1 week.

## 2019-09-03 ENCOUNTER — Other Ambulatory Visit: Payer: Self-pay

## 2019-09-03 ENCOUNTER — Ambulatory Visit (INDEPENDENT_AMBULATORY_CARE_PROVIDER_SITE_OTHER): Payer: Medicare Other

## 2019-09-03 DIAGNOSIS — Z7901 Long term (current) use of anticoagulants: Secondary | ICD-10-CM

## 2019-09-03 DIAGNOSIS — I4891 Unspecified atrial fibrillation: Secondary | ICD-10-CM

## 2019-09-03 DIAGNOSIS — Z5181 Encounter for therapeutic drug level monitoring: Secondary | ICD-10-CM

## 2019-09-03 LAB — POCT INR: INR: 3.4 — AB (ref 2.0–3.0)

## 2019-09-03 MED ORDER — WARFARIN SODIUM 5 MG PO TABS: 5.0000 mg | ORAL_TABLET | Freq: Every day | ORAL | 0 refills | Status: DC

## 2019-09-03 NOTE — Patient Instructions (Signed)
Decrease dosage to 2 tablets daily except 1.5 tablets on Monday and Friday. Recheck INR in 1 week.

## 2019-09-10 ENCOUNTER — Other Ambulatory Visit: Payer: Self-pay

## 2019-09-10 ENCOUNTER — Ambulatory Visit (INDEPENDENT_AMBULATORY_CARE_PROVIDER_SITE_OTHER): Payer: Medicare Other

## 2019-09-10 DIAGNOSIS — I4891 Unspecified atrial fibrillation: Secondary | ICD-10-CM | POA: Diagnosis not present

## 2019-09-10 DIAGNOSIS — Z5181 Encounter for therapeutic drug level monitoring: Secondary | ICD-10-CM | POA: Diagnosis not present

## 2019-09-10 DIAGNOSIS — Z7901 Long term (current) use of anticoagulants: Secondary | ICD-10-CM | POA: Diagnosis not present

## 2019-09-10 LAB — POCT INR: INR: 4.4 — AB (ref 2.0–3.0)

## 2019-09-10 NOTE — Patient Instructions (Signed)
Hold today and tomorrow and then Decrease dosage to 1.5 tablets daily except 2 tablets on Tuesday, Thursday and Saturday. Recheck INR in 1 week.

## 2019-09-19 ENCOUNTER — Other Ambulatory Visit: Payer: Self-pay

## 2019-09-19 ENCOUNTER — Ambulatory Visit (INDEPENDENT_AMBULATORY_CARE_PROVIDER_SITE_OTHER): Payer: Medicare Other | Admitting: Pharmacist

## 2019-09-19 DIAGNOSIS — I4891 Unspecified atrial fibrillation: Secondary | ICD-10-CM

## 2019-09-19 DIAGNOSIS — Z7901 Long term (current) use of anticoagulants: Secondary | ICD-10-CM

## 2019-09-19 LAB — POCT INR: INR: 2.5 (ref 2.0–3.0)

## 2019-10-03 ENCOUNTER — Ambulatory Visit (INDEPENDENT_AMBULATORY_CARE_PROVIDER_SITE_OTHER): Payer: Medicare Other

## 2019-10-03 ENCOUNTER — Other Ambulatory Visit: Payer: Self-pay

## 2019-10-03 DIAGNOSIS — Z7901 Long term (current) use of anticoagulants: Secondary | ICD-10-CM | POA: Diagnosis not present

## 2019-10-03 DIAGNOSIS — I4891 Unspecified atrial fibrillation: Secondary | ICD-10-CM | POA: Diagnosis not present

## 2019-10-03 DIAGNOSIS — Z5181 Encounter for therapeutic drug level monitoring: Secondary | ICD-10-CM

## 2019-10-03 LAB — POCT INR: INR: 2.2 (ref 2.0–3.0)

## 2019-10-03 MED ORDER — WARFARIN SODIUM 5 MG PO TABS: 5.0000 mg | ORAL_TABLET | Freq: Every day | ORAL | 1 refills | Status: DC

## 2019-10-03 NOTE — Patient Instructions (Signed)
Continue taking 1 and 1/2 tablet daily. Repeat INR in 2 weeks

## 2019-10-30 ENCOUNTER — Ambulatory Visit (INDEPENDENT_AMBULATORY_CARE_PROVIDER_SITE_OTHER): Payer: Medicare Other

## 2019-10-30 ENCOUNTER — Other Ambulatory Visit: Payer: Self-pay

## 2019-10-30 DIAGNOSIS — I4891 Unspecified atrial fibrillation: Secondary | ICD-10-CM

## 2019-10-30 DIAGNOSIS — Z5181 Encounter for therapeutic drug level monitoring: Secondary | ICD-10-CM

## 2019-10-30 DIAGNOSIS — Z7901 Long term (current) use of anticoagulants: Secondary | ICD-10-CM

## 2019-10-30 LAB — POCT INR: INR: 1.1 — AB (ref 2.0–3.0)

## 2019-10-30 MED ORDER — WARFARIN SODIUM 5 MG PO TABS: 5.0000 mg | ORAL_TABLET | Freq: Every day | ORAL | 1 refills | Status: DC

## 2019-10-30 NOTE — Patient Instructions (Signed)
Take 2 today and then Continue taking 1 and 1/2 tablet daily. Repeat INR in 2 weeks

## 2019-11-12 ENCOUNTER — Ambulatory Visit (INDEPENDENT_AMBULATORY_CARE_PROVIDER_SITE_OTHER): Payer: Medicare Other

## 2019-11-12 ENCOUNTER — Other Ambulatory Visit: Payer: Self-pay

## 2019-11-12 DIAGNOSIS — Z7901 Long term (current) use of anticoagulants: Secondary | ICD-10-CM | POA: Diagnosis not present

## 2019-11-12 DIAGNOSIS — Z5181 Encounter for therapeutic drug level monitoring: Secondary | ICD-10-CM | POA: Diagnosis not present

## 2019-11-12 DIAGNOSIS — I4891 Unspecified atrial fibrillation: Secondary | ICD-10-CM

## 2019-11-12 LAB — POCT INR: INR: 1.5 — AB (ref 2.0–3.0)

## 2019-11-12 NOTE — Patient Instructions (Signed)
Take 2.5 today only and then Continue taking 1 and 1/2 tablet daily except 2 tablets on Monday and Friday. Repeat INR in 2 weeks

## 2019-11-26 ENCOUNTER — Other Ambulatory Visit: Payer: Self-pay

## 2019-11-26 ENCOUNTER — Ambulatory Visit (INDEPENDENT_AMBULATORY_CARE_PROVIDER_SITE_OTHER): Payer: Medicare Other

## 2019-11-26 DIAGNOSIS — Z7901 Long term (current) use of anticoagulants: Secondary | ICD-10-CM | POA: Diagnosis not present

## 2019-11-26 DIAGNOSIS — Z5181 Encounter for therapeutic drug level monitoring: Secondary | ICD-10-CM | POA: Diagnosis not present

## 2019-11-26 DIAGNOSIS — I4891 Unspecified atrial fibrillation: Secondary | ICD-10-CM | POA: Diagnosis not present

## 2019-11-26 LAB — POCT INR: INR: 1.7 — AB (ref 2.0–3.0)

## 2019-11-26 NOTE — Patient Instructions (Signed)
Take 2.5 today only and then Continue taking 1 and 1/2 tablet daily except 2 tablets on Monday and Friday. Repeat INR in 4 weeks

## 2019-12-03 ENCOUNTER — Ambulatory Visit: Payer: Medicare Other | Admitting: Internal Medicine

## 2019-12-08 ENCOUNTER — Other Ambulatory Visit: Payer: Self-pay

## 2019-12-08 ENCOUNTER — Encounter: Payer: Self-pay | Admitting: Internal Medicine

## 2019-12-08 ENCOUNTER — Ambulatory Visit (INDEPENDENT_AMBULATORY_CARE_PROVIDER_SITE_OTHER): Payer: Medicare Other | Admitting: Internal Medicine

## 2019-12-08 VITALS — BP 120/64 | HR 84 | Temp 97.3°F | Ht 73.0 in | Wt 216.0 lb

## 2019-12-08 DIAGNOSIS — Z7901 Long term (current) use of anticoagulants: Secondary | ICD-10-CM | POA: Diagnosis not present

## 2019-12-08 DIAGNOSIS — K56609 Unspecified intestinal obstruction, unspecified as to partial versus complete obstruction: Secondary | ICD-10-CM

## 2019-12-08 DIAGNOSIS — M7989 Other specified soft tissue disorders: Secondary | ICD-10-CM | POA: Diagnosis not present

## 2019-12-08 DIAGNOSIS — I10 Essential (primary) hypertension: Secondary | ICD-10-CM

## 2019-12-08 DIAGNOSIS — Z79899 Other long term (current) drug therapy: Secondary | ICD-10-CM

## 2019-12-08 DIAGNOSIS — I5032 Chronic diastolic (congestive) heart failure: Secondary | ICD-10-CM | POA: Diagnosis not present

## 2019-12-08 DIAGNOSIS — I4819 Other persistent atrial fibrillation: Secondary | ICD-10-CM

## 2019-12-08 MED ORDER — FUROSEMIDE 20 MG PO TABS
20.0000 mg | ORAL_TABLET | ORAL | 3 refills | Status: DC | PRN
Start: 2019-12-08 — End: 2021-06-11

## 2019-12-08 MED ORDER — WARFARIN SODIUM 5 MG PO TABS: 5.0000 mg | ORAL_TABLET | Freq: Every day | ORAL | 1 refills | Status: DC

## 2019-12-08 NOTE — Patient Instructions (Addendum)
Medication Instructions:  COUMADIN SCHEDULE GIVEN- NEXT COUMADIN APT 11/3 at [place] (FUROSEMIDE) 20mg  TAKE AS NEEDED FOR WEIGHT GAIN (3-5 POUNDS) AND LEG SWELLING.  *If you need a refill on your cardiac medications before your next appointment, please call your pharmacy*  Lab Work: None Ordered At This Time.  If you have labs (blood work) drawn today and your tests are completely normal, you will receive your results only by: Marland Kitchen MyChart Message (if you have MyChart) OR . A paper copy in the mail If you have any lab test that is abnormal or we need to change your treatment, we will call you to review the results.  Testing/Procedures: None Ordered At This Time.   Follow-Up: At [facility], you and your health needs are our priority.  As part of our continuing mission to provide you with exceptional heart care, we have created designated Provider Care Teams.  These Care Teams include your primary Cardiologist (physician) and Advanced Practice Providers (APPs -  Physician Assistants and Nurse Practitioners) who all work together to provide you with the care you need, when you need it.  We recommend signing up for the patient portal called "MyChart".  Sign up information is provided on this After Visit Summary.  MyChart is used to connect with patients for Virtual Visits (Telemedicine).  Patients are able to view lab/test results, encounter notes, upcoming appointments, etc.  Non-urgent messages can be sent to your provider as well.   To learn more about what you can do with MyChart, go to [email or web address].    Your next appointment:   6 month(s)  The format for your next appointment:   In Person  Provider:   Cherlynn Kaiser, MD  Other Instructions

## 2019-12-08 NOTE — Progress Notes (Signed)
Cardiology Office Note:    Date:  12/08/2019   ID:  Jake [name], DOB 06-Aug-1945, MRN [id number]  PCP:  Glendon Axe, MD  Cardiologist:  Elouise Munroe, MD  Electrophysiologist:  None   Referring MD: Glendon Axe, MD   Chief Complaint/Reason for Referral: AFIB  History of Present Illness:    Jake [name] is a 74 y.o. male with a history of hypertension, rheumatoid arthritis, AAA without rupture, hepatitis C, COPD, who I initially met while hospitalized for small bowel obstruction with failed conservative management and subsequent surgical intervention.  Hospitalization complicated by atrial fibrillation with rapid ventricular response.  Started on diltiazem 120 mg daily.  Due to cost DOAC deferred, and he was anticoagulated with Lovenox bridged to Coumadin.  Due to hypotension hydrochlorothiazide discontinued.  He feels well overall but tells me he has run out of his Coumadin and has been off of anticoagulation for 3 weeks.  We discussed the critical nature of continued anticoagulation for stroke prevention.  I have insisted that he contact us prior to running out of his warfarin so that we can provide refills.  I have also offered him a DOAC today to avoid fluctuations in INR, but he declines due to cost.  He denies chest pain, palpitations.  Endorses chronic shortness of breath and takes inhalers for COPD.  Actively wheezing on exam today.  Endorses bilateral ankle and foot swelling.  Does not have a prescription for Lasix.  We discussed indications for as needed Lasix use.  At our last visit he mentioned that his wife was hospitalized for stroke.  She is recovering well but has some word finding difficulties. Past Medical History:  Diagnosis Date  . AAA (abdominal aortic aneurysm) without rupture ([place])   . COPD (chronic obstructive pulmonary disease) ([place])   . Hepatitis A   . Hypertension   . PAF (paroxysmal atrial fibrillation) ([place])   . Rheumatoid arthritis [facility])      Past Surgical History:  Procedure Laterality Date  . BACK SURGERY    . FRACTURE SURGERY    . JOINT REPLACEMENT    . LAPAROTOMY N/A 07/25/2019   Procedure: EXPLORATORY LAPAROTOMY,REPAIR AND ANASTOMOSIS OF SMALL BOWEL, ENTEROLYSIS;  Surgeon: [name], MD;  Location: [facility];  Service: General;  Laterality: N/A;    Current Medications: Current Meds  Medication Sig  . chlorpheniramine-HYDROcodone (TUSSIONEX PENNKINETIC ER) 10-8 MG/5ML SUER Take 5 mLs by mouth every 12 (twelve) hours as needed for cough.  . diltiazem (CARDIZEM CD) 120 MG 24 hr capsule Take 1 capsule (120 mg total) by mouth daily.  . folic acid (FOLVITE) 1 MG tablet Take 1 mg by mouth daily.  Marland Kitchen gabapentin (NEURONTIN) 300 MG capsule Take 300 mg by mouth 2 (two) times daily. Reported on 06/03/2015  . HYDROcodone-acetaminophen (NORCO/VICODIN) 5-325 MG tablet Take 1 tablet by mouth every 6 (six) hours as needed.  . magnesium oxide (MAG-OX) 400 MG tablet Take 400 mg by mouth daily.   . methotrexate 2.5 MG tablet Take 12.5 mg by mouth once a week. tuesdays  . sildenafil (REVATIO) 20 MG tablet Reported on 06/03/2015  . VENTOLIN HFA 108 (90 Base) MCG/ACT inhaler Inhale 1-2 puffs into the lungs every 4 (four) hours as needed.      Allergies:   Propoxyphene   Social History   Tobacco Use  . Smoking status: Current Every Day Smoker  . Smokeless tobacco: Never Used  Vaping Use  . Vaping Use: Never used  Substance Use Topics  .  Alcohol use: Yes    Alcohol/week: 0.0 standard drinks    Comment: 3 beers weekly  . Drug use: No     Family History: The patient's Family history is unknown by patient.  ROS:   Please see the history of present illness.    All other systems reviewed and are negative.  EKGs/Labs/Other Studies Reviewed:    The following studies were reviewed today:  EKG: Atrial fibrillation rate 84, left axis deviation  Recent Labs: 07/29/2019: B Natriuretic Peptide 378.9 07/31/2019: ALT 30 08/05/2019:  TSH 2.812 08/06/2019: Hemoglobin 9.6; Platelets 244 08/07/2019: BUN 27; Creatinine, Ser 1.01; Magnesium 1.8; Potassium 4.2; Sodium 131  Recent Lipid Panel    Component Value Date/Time   TRIG 107 07/29/2019 0304    Physical Exam:    VS:  BP 120/64 (BP Location: Right Arm, Patient Position: Sitting, Cuff Size: Normal)   Pulse 84   Temp (!) 97.3 F (36.3 C)   Ht '6\' 1"'  (1.854 m)   Wt 216 lb (98 kg)   BMI 28.50 kg/m     Wt Readings from Last 5 Encounters:  12/08/19 216 lb (98 kg)  08/26/19 194 lb 3.2 oz (88.1 kg)  08/04/19 204 lb 2.3 oz (92.6 kg)  01/15/19 220 lb (99.8 kg)  03/10/17 219 lb (99.3 kg)    Constitutional: No acute distress Eyes: sclera non-icteric, normal conjunctiva and lids ENMT: normal dentition, moist mucous membranes Cardiovascular: Irregular rhythm, normal rate, no murmurs. S1 and S2 normal. Radial pulses normal bilaterally. No jugular venous distention.  Respiratory: Active wheezing in all lung fields GI : normal bowel sounds, soft and nontender. No distention.   MSK: extremities warm, well perfused.  Mild pedal edema.  NEURO: grossly nonfocal exam, moves all extremities. PSYCH: alert and oriented x 3, normal mood and affect.   ASSESSMENT:    1. Persistent atrial fibrillation ([place])   2. Long term (current) use of anticoagulants   3. Chronic diastolic (congestive) heart failure ([place])   4. Leg swelling   5. Essential hypertension   6. SBO (small bowel obstruction) ([place])   7. Medication management    PLAN:    Persistent atrial fibrillation (HCC) Long term (current) use of anticoagulants -Continue diltiazem 120 mg daily -Patient has been out of warfarin for at least 3 weeks.  We have discussed this with the Coumadin clinic who have provided a refill with adequate supplies till his next appointment to [place].  INR check 12/24/2019. -No bleeding while on anticoagulation, CHA2DS2-VASc score is at least 3 for hypertension and age.  Chronic  diastolic (congestive) heart failure (HCC) Leg swelling - Plan: EKG 12-Lead -We'll provide a prescription for Lasix as needed to be taken for leg swelling or weight gain  Essential hypertension-continue diltiazem, blood pressure well controlled  SBO (small bowel obstruction) ([place]) -no acute issues per patient.  Total time of encounter: 30 minutes total time of encounter, including 25 minutes spent in face-to-face patient care on the date of this encounter. This time includes coordination of care and counseling regarding above mentioned problem list. Remainder of non-face-to-face time involved reviewing chart documents/testing relevant to the patient encounter and documentation in the medical record. I have independently reviewed documentation from referring provider.   Cherlynn Kaiser, MD [place]  [facility]    Medication Adjustments/Labs and Tests Ordered: Current medicines are reviewed at length with the patient today.  Concerns regarding medicines are outlined above.   Orders Placed This Encounter  Procedures  . EKG  12-Lead    Meds ordered this encounter  Medications  . furosemide (LASIX) 20 MG tablet    Sig: Take 1 tablet (20 mg total) by mouth as needed (TAKE AS NEEDED FOR WEIGHT GAIN 3-5 POUNDS- OR LEG SWELLING).    Dispense:  60 tablet    Refill:  3    Patient Instructions  Medication Instructions:  COUMADIN SCHEDULE GIVEN- NEXT COUMADIN APT 11/3 at [place] (FUROSEMIDE) 47m TAKE AS NEEDED FOR WEIGHT GAIN (3-5 POUNDS) AND LEG SWELLING.  *If you need a refill on your cardiac medications before your next appointment, please call your pharmacy*  Lab Work: None Ordered At This Time.  If you have labs (blood work) drawn today and your tests are completely normal, you will receive your results only by: .Marland KitchenMyChart Message (if you have MyChart) OR . A paper copy in the mail If you have any lab test that is abnormal or we need to change your treatment, we will call  you to review the results.  Testing/Procedures: None Ordered At This Time.   Follow-Up: [facility] and your health needs are our priority.  As part of our continuing mission to provide you with exceptional heart care, we have created designated Provider Care Teams.  These Care Teams include your primary Cardiologist (physician) and Advanced Practice Providers (APPs -  Physician Assistants and Nurse Practitioners) who all work together to provide you with the care you need, when you need it.  We recommend signing up for the patient portal called "MyChart".  Sign up information is provided on this After Visit Summary.  MyChart is used to connect with patients for Virtual Visits (Telemedicine).  Patients are able to view lab/test results, encounter notes, upcoming appointments, etc.  Non-urgent messages can be sent to your provider as well.   To learn more about what you can do with MyChart, go to [email or web address]    Your next appointment:   6 month(s)  The format for your next appointment:   In Person  Provider:   GCherlynn Kaiser MD  Other Instructions

## 2019-12-26 ENCOUNTER — Ambulatory Visit (INDEPENDENT_AMBULATORY_CARE_PROVIDER_SITE_OTHER): Payer: Medicare Other

## 2019-12-26 ENCOUNTER — Other Ambulatory Visit: Payer: Self-pay

## 2019-12-26 DIAGNOSIS — Z5181 Encounter for therapeutic drug level monitoring: Secondary | ICD-10-CM

## 2019-12-26 DIAGNOSIS — Z7901 Long term (current) use of anticoagulants: Secondary | ICD-10-CM

## 2019-12-26 DIAGNOSIS — I4891 Unspecified atrial fibrillation: Secondary | ICD-10-CM | POA: Diagnosis not present

## 2019-12-26 LAB — POCT INR: INR: 2 (ref 2.0–3.0)

## 2019-12-26 MED ORDER — WARFARIN SODIUM 5 MG PO TABS: ORAL_TABLET | ORAL | 1 refills | Status: DC

## 2019-12-26 NOTE — Patient Instructions (Signed)
Continue taking 1 and 1/2 tablets daily except 2 tablets on Monday and Friday. Repeat INR in 4 weeks

## 2020-01-23 ENCOUNTER — Other Ambulatory Visit: Payer: Self-pay

## 2020-01-23 ENCOUNTER — Ambulatory Visit (INDEPENDENT_AMBULATORY_CARE_PROVIDER_SITE_OTHER): Payer: Medicare Other

## 2020-01-23 DIAGNOSIS — Z5181 Encounter for therapeutic drug level monitoring: Secondary | ICD-10-CM | POA: Diagnosis not present

## 2020-01-23 DIAGNOSIS — I4891 Unspecified atrial fibrillation: Secondary | ICD-10-CM

## 2020-01-23 DIAGNOSIS — Z7901 Long term (current) use of anticoagulants: Secondary | ICD-10-CM

## 2020-01-23 LAB — POCT INR: INR: 3.2 — AB (ref 2.0–3.0)

## 2020-01-23 NOTE — Patient Instructions (Signed)
Continue taking 1 and 1/2 tablets daily except 2 tablets on Monday and Friday. Repeat INR in 6 weeks. Eat greens over the weekend.

## 2020-02-27 ENCOUNTER — Other Ambulatory Visit (HOSPITAL_BASED_OUTPATIENT_CLINIC_OR_DEPARTMENT_OTHER): Payer: Self-pay | Admitting: Surgery

## 2020-02-27 ENCOUNTER — Other Ambulatory Visit (HOSPITAL_COMMUNITY): Payer: Self-pay | Admitting: Surgery

## 2020-02-27 DIAGNOSIS — R109 Unspecified abdominal pain: Secondary | ICD-10-CM

## 2020-03-05 ENCOUNTER — Ambulatory Visit (HOSPITAL_COMMUNITY)
Admission: RE | Admit: 2020-03-05 | Discharge: 2020-03-05 | Disposition: A | Discharge status: Home / Self Care | Payer: Medicare Other | Source: Ambulatory Visit | Attending: Surgery | Admitting: Surgery

## 2020-03-05 ENCOUNTER — Encounter (HOSPITAL_COMMUNITY): Payer: Self-pay

## 2020-03-05 ENCOUNTER — Ambulatory Visit (HOSPITAL_BASED_OUTPATIENT_CLINIC_OR_DEPARTMENT_OTHER): Admission: RE | Admit: 2020-03-05 | Payer: Medicare Other | Source: Ambulatory Visit

## 2020-03-05 ENCOUNTER — Ambulatory Visit (INDEPENDENT_AMBULATORY_CARE_PROVIDER_SITE_OTHER): Payer: Medicare Other

## 2020-03-05 ENCOUNTER — Other Ambulatory Visit: Payer: Self-pay

## 2020-03-05 DIAGNOSIS — Z7901 Long term (current) use of anticoagulants: Secondary | ICD-10-CM

## 2020-03-05 DIAGNOSIS — R109 Unspecified abdominal pain: Secondary | ICD-10-CM | POA: Diagnosis not present

## 2020-03-05 DIAGNOSIS — I4891 Unspecified atrial fibrillation: Secondary | ICD-10-CM

## 2020-03-05 DIAGNOSIS — Z5181 Encounter for therapeutic drug level monitoring: Secondary | ICD-10-CM

## 2020-03-05 HISTORY — DX: Unspecified asthma, uncomplicated: J45.909

## 2020-03-05 HISTORY — DX: Heart failure, unspecified: I50.9

## 2020-03-05 LAB — POCT I-STAT CREATININE: Creatinine, Ser: 1.1 mg/dL (ref 0.61–1.24)

## 2020-03-05 LAB — POCT INR: INR: 2.1 (ref 2.0–3.0)

## 2020-03-05 MED ORDER — WARFARIN SODIUM 5 MG PO TABS: ORAL_TABLET | ORAL | 1 refills | Status: DC

## 2020-03-05 MED ORDER — IOHEXOL 300 MG/ML  SOLN
100.0000 mL | Freq: Once | INTRAMUSCULAR | Status: AC | PRN
  Administered 2020-03-05: 100 mL via INTRAVENOUS

## 2020-03-05 NOTE — Patient Instructions (Signed)
Continue taking 1 and 1/2 tablets daily except 2 tablets on Monday and Friday. Repeat INR in 6 weeks.

## 2020-04-16 ENCOUNTER — Other Ambulatory Visit: Payer: Self-pay

## 2020-04-16 ENCOUNTER — Ambulatory Visit (INDEPENDENT_AMBULATORY_CARE_PROVIDER_SITE_OTHER): Payer: Medicare Other

## 2020-04-16 DIAGNOSIS — Z5181 Encounter for therapeutic drug level monitoring: Secondary | ICD-10-CM | POA: Diagnosis not present

## 2020-04-16 DIAGNOSIS — Z7901 Long term (current) use of anticoagulants: Secondary | ICD-10-CM | POA: Diagnosis not present

## 2020-04-16 DIAGNOSIS — I4891 Unspecified atrial fibrillation: Secondary | ICD-10-CM | POA: Diagnosis not present

## 2020-04-16 LAB — POCT INR: INR: 3.7 — AB (ref 2.0–3.0)

## 2020-04-16 NOTE — Patient Instructions (Signed)
Hold tonight only and then Continue taking 1 and 1/2 tablets daily except 2 tablets on Monday and Friday. Repeat INR in 3 weeks.

## 2020-05-07 ENCOUNTER — Other Ambulatory Visit: Payer: Self-pay

## 2020-05-07 ENCOUNTER — Ambulatory Visit (INDEPENDENT_AMBULATORY_CARE_PROVIDER_SITE_OTHER): Payer: Medicare Other

## 2020-05-07 DIAGNOSIS — Z5181 Encounter for therapeutic drug level monitoring: Secondary | ICD-10-CM

## 2020-05-07 DIAGNOSIS — I4891 Unspecified atrial fibrillation: Secondary | ICD-10-CM | POA: Diagnosis not present

## 2020-05-07 DIAGNOSIS — Z7901 Long term (current) use of anticoagulants: Secondary | ICD-10-CM

## 2020-05-07 LAB — POCT INR: INR: 3.5 — AB (ref 2.0–3.0)

## 2020-05-07 NOTE — Patient Instructions (Signed)
Hold tonight only and then decrease to 1 and 1/2 tablets daily. Repeat INR in 3 weeks.

## 2020-05-28 ENCOUNTER — Other Ambulatory Visit: Payer: Self-pay

## 2020-05-28 ENCOUNTER — Ambulatory Visit (INDEPENDENT_AMBULATORY_CARE_PROVIDER_SITE_OTHER): Payer: Medicare Other

## 2020-05-28 DIAGNOSIS — I4891 Unspecified atrial fibrillation: Secondary | ICD-10-CM

## 2020-05-28 DIAGNOSIS — Z5181 Encounter for therapeutic drug level monitoring: Secondary | ICD-10-CM

## 2020-05-28 DIAGNOSIS — Z7901 Long term (current) use of anticoagulants: Secondary | ICD-10-CM

## 2020-05-28 LAB — POCT INR: INR: 2.7 (ref 2.0–3.0)

## 2020-05-28 MED ORDER — WARFARIN SODIUM 5 MG PO TABS: ORAL_TABLET | ORAL | 1 refills | Status: DC

## 2020-05-28 NOTE — Patient Instructions (Signed)
Continue taking 1 and 1/2 tablets daily. Repeat INR in 6 weeks.

## 2020-06-18 ENCOUNTER — Encounter: Payer: Self-pay | Admitting: Internal Medicine

## 2020-06-18 ENCOUNTER — Ambulatory Visit (INDEPENDENT_AMBULATORY_CARE_PROVIDER_SITE_OTHER): Payer: Medicare Other | Admitting: Internal Medicine

## 2020-06-18 ENCOUNTER — Other Ambulatory Visit: Payer: Self-pay

## 2020-06-18 VITALS — BP 110/60 | HR 76 | Ht 73.0 in | Wt 215.6 lb

## 2020-06-18 DIAGNOSIS — I4819 Other persistent atrial fibrillation: Secondary | ICD-10-CM | POA: Diagnosis not present

## 2020-06-18 DIAGNOSIS — Z7901 Long term (current) use of anticoagulants: Secondary | ICD-10-CM

## 2020-06-18 DIAGNOSIS — I10 Essential (primary) hypertension: Secondary | ICD-10-CM | POA: Diagnosis not present

## 2020-06-18 DIAGNOSIS — I5032 Chronic diastolic (congestive) heart failure: Secondary | ICD-10-CM

## 2020-06-18 DIAGNOSIS — Z79899 Other long term (current) drug therapy: Secondary | ICD-10-CM

## 2020-06-18 DIAGNOSIS — D6869 Other thrombophilia: Secondary | ICD-10-CM

## 2020-06-18 DIAGNOSIS — M7989 Other specified soft tissue disorders: Secondary | ICD-10-CM

## 2020-06-18 DIAGNOSIS — K56609 Unspecified intestinal obstruction, unspecified as to partial versus complete obstruction: Secondary | ICD-10-CM

## 2020-06-18 NOTE — Patient Instructions (Signed)
Medication Instructions:  Your physician recommends that you continue on your current medications as directed. Please refer to the Current Medication list given to you today.  *If you need a refill on your cardiac medications before your next appointment, please call your pharmacy*   Follow-Up: At [facility], you and your health needs are our priority.  As part of our continuing mission to provide you with exceptional heart care, we have created designated Provider Care Teams.  These Care Teams include your primary Cardiologist (physician) and Advanced Practice Providers (APPs -  Physician Assistants and Nurse Practitioners) who all work together to provide you with the care you need, when you need it.  We recommend signing up for the patient portal called "MyChart".  Sign up information is provided on this After Visit Summary.  MyChart is used to connect with patients for Virtual Visits (Telemedicine).  Patients are able to view lab/test results, encounter notes, upcoming appointments, etc.  Non-urgent messages can be sent to your provider as well.   To learn more about what you can do with MyChart, go to [email or web address].    Your next appointment:   6 month(s)  The format for your next appointment:   In Person  Provider:   Cherlynn Kaiser, MD

## 2020-06-18 NOTE — Progress Notes (Signed)
Cardiology Office Note:    Date:  06/18/2020   ID:  Jake [name], DOB 1945-08-19, MRN [id number]  PCP:  Glendon Axe, MD  Cardiologist:  Elouise Munroe, MD  Electrophysiologist:  None   Referring MD: Glendon Axe, MD   Chief Complaint/Reason for Referral: Afib  History of Present Illness:    Jake [name] is a 75 y.o. male with a history of hypertension, rheumatoid arthritis, AAA without rupture, hepatitis C, COPD, who I initially met while hospitalized for small bowel obstruction with failed conservative management and subsequent surgical intervention. Hospitalization complicated by atrial fibrillation with rapid ventricular response. Started on diltiazem 120 mg daily. Due to cost DOAC deferred, and he was anticoagulated with Lovenox bridged to Coumadin. Due to hypotension hydrochlorothiazide discontinued.   At our last visit he had bilateral ankle and foot swelling, we discussed as needed Lasix use.  He is now doing this.  He tells me that if he can feel his ankle bones, he knows he is fine, but if he starts to feel that his ankles are getting puffy, he will take 20 mg of Lasix for several days until he feels better.  We discussed that this is the appropriate use of Lasix, and also should be used if his weight increases or if he is short of breath.  He remains in atrial fibrillation at this time which I suspect may now be permanent or at least persistent.  Fortunately he is asymptomatic in atrial fibrillation.  Continues on warfarin for anticoagulation (deferred DOAC due to cost) and is followed closely by Coumadin clinic.  Most recent INR 2.7, on review has had some supratherapeutic values this year highest 3.7.  Denies chest pain, shortness of breath, palpitations.  Denies cough, fever, chills.  Denies syncope or presyncope.  Denies lower extremity swelling today.  He is looking forward to a birthday party for one of his grandchildren this weekend.  He plans to do the cooking on the  grill.   Past Medical History:  Diagnosis Date  . AAA (abdominal aortic aneurysm) without rupture ([place])   . Asthma   . CHF (congestive heart failure) ([place])   . COPD (chronic obstructive pulmonary disease) ([place])   . Hepatitis A   . Hypertension   . PAF (paroxysmal atrial fibrillation) ([place])   . Rheumatoid arthritis [facility])     Past Surgical History:  Procedure Laterality Date  . BACK SURGERY    . FRACTURE SURGERY    . JOINT REPLACEMENT    . LAPAROTOMY N/A 07/25/2019   Procedure: EXPLORATORY LAPAROTOMY,REPAIR AND ANASTOMOSIS OF SMALL BOWEL, ENTEROLYSIS;  Surgeon: [name], MD;  Location: [facility];  Service: General;  Laterality: N/A;    Current Medications: Current Meds  Medication Sig  . diltiazem (CARDIZEM CD) 120 MG 24 hr capsule Take 1 capsule (120 mg total) by mouth daily.  . folic acid (FOLVITE) 1 MG tablet Take 1 mg by mouth daily.  . furosemide (LASIX) 20 MG tablet Take 1 tablet (20 mg total) by mouth as needed (TAKE AS NEEDED FOR WEIGHT GAIN 3-5 POUNDS- OR LEG SWELLING).  Marland Kitchen gabapentin (NEURONTIN) 300 MG capsule Take 300 mg by mouth 2 (two) times daily. Reported on 06/03/2015  . HYDROcodone-acetaminophen (NORCO/VICODIN) 5-325 MG tablet Take 1 tablet by mouth every 6 (six) hours as needed.  . magnesium oxide (MAG-OX) 400 MG tablet Take 400 mg by mouth daily.   . VENTOLIN HFA 108 (90 Base) MCG/ACT inhaler Inhale 1-2 puffs into the lungs every 4 (four)  hours as needed.   . warfarin (COUMADIN) 5 MG tablet Take 1-2 tablets daily or as prescribed by Clinic     Allergies:   Propoxyphene   Social History   Tobacco Use  . Smoking status: Current Every Day Smoker  . Smokeless tobacco: Never Used  Vaping Use  . Vaping Use: Never used  Substance Use Topics  . Alcohol use: Yes    Alcohol/week: 0.0 standard drinks    Comment: 3 beers weekly  . Drug use: No     Family History: The patient's Family history is unknown by patient.  ROS:   Please see the history of present  illness.    All other systems reviewed and are negative.  EKGs/Labs/Other Studies Reviewed:    The following studies were reviewed today:  EKG:  Afib, PVCs, septal infarct pattern.    Recent Labs: 07/29/2019: B Natriuretic Peptide 378.9 07/31/2019: ALT 30 08/05/2019: TSH 2.812 08/06/2019: Hemoglobin 9.6; Platelets 244 08/07/2019: BUN 27; Magnesium 1.8; Potassium 4.2; Sodium 131 03/05/2020: Creatinine, Ser 1.10  Recent Lipid Panel    Component Value Date/Time   TRIG 107 07/29/2019 0304    Physical Exam:    VS:  BP 110/60   Pulse 76   Ht 6' 1" (1.854 m)   Wt 215 lb 9.6 oz (97.8 kg)   SpO2 98%   BMI 28.44 kg/m     Wt Readings from Last 5 Encounters:  06/18/20 215 lb 9.6 oz (97.8 kg)  12/08/19 216 lb (98 kg)  08/26/19 194 lb 3.2 oz (88.1 kg)  08/04/19 204 lb 2.3 oz (92.6 kg)  01/15/19 220 lb (99.8 kg)    Constitutional: No acute distress Eyes: sclera non-icteric, normal conjunctiva and lids ENMT: normal dentition, moist mucous membranes Cardiovascular: Irregular rhythm, normal rate, no murmurs. S1 and S2 normal. Radial pulses normal bilaterally. No jugular venous distention.  Respiratory: clear to auscultation bilaterally GI : normal bowel sounds, soft and nontender. No distention.   MSK: extremities warm, well perfused. No edema.  NEURO: grossly nonfocal exam, moves all extremities. PSYCH: alert and oriented x 3, normal mood and affect.   ASSESSMENT:    1. Persistent atrial fibrillation ([place])   2. Secondary hypercoagulable state ([place])   3. Long term (current) use of anticoagulants   4. Essential hypertension   5. Chronic diastolic (congestive) heart failure (HCC)   6. Leg swelling   7. SBO (small bowel obstruction) ([place])   8. Medication management    PLAN:    Persistent atrial fibrillation (HCC) Secondary hypercoagulable state ([place]) Long term (current) use of anticoagulants -Continues on anticoagulation with warfarin for a CHA2DS2-VASc score of 3 for  hypertension and age.  No bleeding events, tolerating well. - Rate control with diltiazem 120 mg daily, continue  Essential hypertension - Plan: EKG 12-Lead -Blood pressure is well controlled on diltiazem, continue  Chronic diastolic (congestive) heart failure (HCC) -Euvolemic today, class I-II symptoms.  Continue as needed Lasix  Leg swelling -None today  SBO (small bowel obstruction) (HCC)-stable symptoms per his report.   Total time of encounter: 33 minutes total time of encounter, including 20 minutes spent in face-to-face patient care on the date of this encounter. This time includes coordination of care and counseling regarding above mentioned problem list. Remainder of non-face-to-face time involved reviewing chart documents/testing relevant to the patient encounter and documentation in the medical record. I have independently reviewed documentation from referring provider.   Cherlynn Kaiser, MD, [place]  Medication Adjustments/Labs and Tests Ordered: Current medicines are reviewed at length with the patient today.  Concerns regarding medicines are outlined above.   Orders Placed This Encounter  Procedures  . EKG 12-Lead    No orders of the defined types were placed in this encounter.   Patient Instructions  Medication Instructions:  Your physician recommends that you continue on your current medications as directed. Please refer to the Current Medication list given to you today.  *If you need a refill on your cardiac medications before your next appointment, please call your pharmacy*   Follow-Up: At [facility], you and your health needs are our priority.  As part of our continuing mission to provide you with exceptional heart care, we have created designated Provider Care Teams.  These Care Teams include your primary Cardiologist (physician) and Advanced Practice Providers (APPs -  Physician Assistants and Nurse Practitioners) who all  work together to provide you with the care you need, when you need it.  We recommend signing up for the patient portal called "MyChart".  Sign up information is provided on this After Visit Summary.  MyChart is used to connect with patients for Virtual Visits (Telemedicine).  Patients are able to view lab/test results, encounter notes, upcoming appointments, etc.  Non-urgent messages can be sent to your provider as well.   To learn more about what you can do with MyChart, go to [email or web address].    Your next appointment:   6 month(s)  The format for your next appointment:   In Person  Provider:   Cherlynn Kaiser, MD

## 2020-07-05 ENCOUNTER — Emergency Department (HOSPITAL_BASED_OUTPATIENT_CLINIC_OR_DEPARTMENT_OTHER): Payer: Medicare Other

## 2020-07-05 ENCOUNTER — Encounter (HOSPITAL_BASED_OUTPATIENT_CLINIC_OR_DEPARTMENT_OTHER): Payer: Self-pay | Admitting: Emergency Medicine

## 2020-07-05 ENCOUNTER — Other Ambulatory Visit: Payer: Self-pay

## 2020-07-05 ENCOUNTER — Emergency Department (HOSPITAL_BASED_OUTPATIENT_CLINIC_OR_DEPARTMENT_OTHER)
Admission: EM | Admit: 2020-07-05 | Discharge: 2020-07-06 | Disposition: A | Discharge status: Home / Self Care | Payer: Medicare Other | Attending: Emergency Medicine | Admitting: Emergency Medicine

## 2020-07-05 DIAGNOSIS — F17200 Nicotine dependence, unspecified, uncomplicated: Secondary | ICD-10-CM | POA: Diagnosis not present

## 2020-07-05 DIAGNOSIS — R0789 Other chest pain: Secondary | ICD-10-CM | POA: Diagnosis present

## 2020-07-05 DIAGNOSIS — I480 Paroxysmal atrial fibrillation: Secondary | ICD-10-CM | POA: Insufficient documentation

## 2020-07-05 DIAGNOSIS — J441 Chronic obstructive pulmonary disease with (acute) exacerbation: Secondary | ICD-10-CM | POA: Insufficient documentation

## 2020-07-05 DIAGNOSIS — I110 Hypertensive heart disease with heart failure: Secondary | ICD-10-CM | POA: Insufficient documentation

## 2020-07-05 DIAGNOSIS — Z7901 Long term (current) use of anticoagulants: Secondary | ICD-10-CM | POA: Diagnosis not present

## 2020-07-05 DIAGNOSIS — I5031 Acute diastolic (congestive) heart failure: Secondary | ICD-10-CM | POA: Insufficient documentation

## 2020-07-05 LAB — CBC
HCT: 40.7 % (ref 39.0–52.0)
Hemoglobin: 13.7 g/dL (ref 13.0–17.0)
MCH: 34.3 pg — ABNORMAL HIGH (ref 26.0–34.0)
MCHC: 33.7 g/dL (ref 30.0–36.0)
MCV: 102 fL — ABNORMAL HIGH (ref 80.0–100.0)
Platelets: 171 10*3/uL (ref 150–400)
RBC: 3.99 MIL/uL — ABNORMAL LOW (ref 4.22–5.81)
RDW: 14.3 % (ref 11.5–15.5)
WBC: 3.3 10*3/uL — ABNORMAL LOW (ref 4.0–10.5)
nRBC: 0 % (ref 0.0–0.2)

## 2020-07-05 LAB — D-DIMER, QUANTITATIVE: D-Dimer, Quant: 0.45 ug/mL-FEU (ref 0.00–0.50)

## 2020-07-05 LAB — TROPONIN I (HIGH SENSITIVITY): Troponin I (High Sensitivity): 14 ng/L (ref <18)

## 2020-07-05 LAB — BASIC METABOLIC PANEL
Anion gap: 13 (ref 5–15)
BUN: 30 mg/dL — ABNORMAL HIGH (ref 8–23)
CO2: 31 mmol/L (ref 22–32)
Calcium: 9 mg/dL (ref 8.9–10.3)
Chloride: 94 mmol/L — ABNORMAL LOW (ref 98–111)
Creatinine, Ser: 1.44 mg/dL — ABNORMAL HIGH (ref 0.61–1.24)
GFR, Estimated: 50 mL/min — ABNORMAL LOW (ref >60)
Glucose, Bld: 151 mg/dL — ABNORMAL HIGH (ref 70–99)
Potassium: 3.6 mmol/L (ref 3.5–5.1)
Sodium: 138 mmol/L (ref 135–145)

## 2020-07-05 MED ORDER — DEXAMETHASONE SODIUM PHOSPHATE 10 MG/ML IJ SOLN
10.0000 mg | Freq: Once | INTRAMUSCULAR | Status: AC
  Administered 2020-07-05: 10 mg via INTRAVENOUS
  Filled 2020-07-05: qty 1

## 2020-07-05 MED ORDER — IPRATROPIUM-ALBUTEROL 0.5-2.5 (3) MG/3ML IN SOLN
3.0000 mL | Freq: Once | RESPIRATORY_TRACT | Status: AC
  Administered 2020-07-05: 3 mL via RESPIRATORY_TRACT
  Filled 2020-07-05: qty 3

## 2020-07-05 MED ORDER — PREDNISONE 20 MG PO TABS: 60.0000 mg | ORAL_TABLET | Freq: Every day | ORAL | 0 refills | Status: AC

## 2020-07-05 MED ORDER — DOXYCYCLINE HYCLATE 100 MG PO CAPS
100.0000 mg | ORAL_CAPSULE | Freq: Two times a day (BID) | ORAL | 0 refills | Status: DC
Start: 2020-07-05 — End: 2021-05-26

## 2020-07-05 NOTE — ED Provider Notes (Signed)
[facility] Provider Note   CSN: [id number] Arrival date & time: 07/05/20  2001     History Chief Complaint  Patient presents with  . Chest Pain    Jake [name] is a 75 y.o. male.  The history is provided by the patient.  Chest Pain Pain location:  R chest and L chest Pain quality comment:  Cramping Pain radiates to:  Does not radiate Pain severity:  Moderate Onset quality:  Sudden Duration: began at 16:30 today. Timing:  Constant Progression:  Unchanged Chronicity:  New Context: at rest   Relieved by:  Nothing Worsened by:  Exertion Ineffective treatments: inhaler. Associated symptoms: cough and shortness of breath   Associated symptoms: no abdominal pain, no back pain, no fever, no lower extremity edema, no palpitations and no vomiting   Risk factors comment:  History of COPD and atrial fibrillation      Past Medical History:  Diagnosis Date  . AAA (abdominal aortic aneurysm) without rupture ([place])   . Asthma   . CHF (congestive heart failure) ([place])   . COPD (chronic obstructive pulmonary disease) ([place])   . Hepatitis A   . Hypertension   . PAF (paroxysmal atrial fibrillation) ([place])   . Rheumatoid arthritis [facility])     Patient Active Problem List   Diagnosis Date Noted  . Long term (current) use of anticoagulants 08/18/2019  . Atrial fibrillation with rapid ventricular response ([place])   . Acute diastolic CHF (congestive heart failure) ([place])   . Essential hypertension   . A-fib ([place]) 07/23/2019  . 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 SBO (small bowel obstruction) ([place]) 07/22/2019  . Plantar fasciitis of left foot 06/03/2015  . 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 Metatarsal deformity 06/03/2015    Past Surgical History:  Procedure Laterality Date  . BACK SURGERY    . FRACTURE SURGERY    . JOINT REPLACEMENT    . LAPAROTOMY N/A 07/25/2019   Procedure: EXPLORATORY LAPAROTOMY,REPAIR AND ANASTOMOSIS OF SMALL BOWEL,  ENTEROLYSIS;  Surgeon: [name], MD;  Location: [facility];  Service: General;  Laterality: N/A;       Family History  Family history unknown: Yes    Social History   Tobacco Use  . Smoking status: Current Every Day Smoker  . Smokeless tobacco: Never Used  Vaping Use  . Vaping Use: Never used  Substance Use Topics  . Alcohol use: Yes    Alcohol/week: 0.0 standard drinks    Comment: 3 beers weekly  . Drug use: No    Home Medications Prior to Admission medications   Medication Sig Start Date End Date Taking? Authorizing Provider  diltiazem (CARDIZEM CD) 120 MG 24 hr capsule Take 1 capsule (120 mg total) by mouth daily. 08/08/19   Amin, Jeanella Flattery, MD  folic acid (FOLVITE) 1 MG tablet Take 1 mg by mouth daily. 04/29/19   [provider]  furosemide (LASIX) 20 MG tablet Take 1 tablet (20 mg total) by mouth as needed (TAKE AS NEEDED FOR WEIGHT GAIN 3-5 POUNDS- OR LEG SWELLING). 12/08/19 03/07/20  Elouise Munroe, MD  gabapentin (NEURONTIN) 300 MG capsule Take 300 mg by mouth 2 (two) times daily. Reported on 06/03/2015    [provider]  HYDROcodone-acetaminophen (NORCO/VICODIN) 5-325 MG tablet Take 1 tablet by mouth every 6 (six) hours as needed. 01/15/19   Virgel Manifold, MD  magnesium oxide (MAG-OX) 400 MG tablet Take 400 mg by mouth daily.     [provider]  VENTOLIN HFA 108 (90 Base) MCG/ACT inhaler Inhale 1-2 puffs into the lungs every 4 (four) hours as needed.  05/30/19   [provider]  warfarin (COUMADIN) 5 MG tablet Take 1-2 tablets daily or as prescribed by Clinic 05/28/20   Elouise Munroe, MD    Allergies    Propoxyphene  Review of Systems   Review of Systems  Constitutional: Negative for chills and fever.  HENT: Negative for ear pain and sore throat.   Eyes: Negative for pain and visual disturbance.  Respiratory: Positive for cough and shortness of breath.   Cardiovascular: Positive for chest pain. Negative for  palpitations.  Gastrointestinal: Negative for abdominal pain and vomiting.  Genitourinary: Negative for dysuria and hematuria.  Musculoskeletal: Negative for arthralgias and back pain.  Skin: Negative for color change and rash.  Neurological: Negative for seizures and syncope.  All other systems reviewed and are negative.   Physical Exam Updated Vital Signs BP 131/82   Pulse 85   Temp 98 F (36.7 C) (Oral)   Resp 20   Ht 6\' 1"  (1.854 m)   Wt 97.1 kg   SpO2 94%   BMI 28.23 kg/m   Physical Exam Vitals and nursing note reviewed.  Constitutional:      Appearance: He is well-developed.  HENT:     Head: Normocephalic and atraumatic.  Eyes:     Conjunctiva/sclera: Conjunctivae normal.  Cardiovascular:     Rate and Rhythm: Normal rate. Rhythm irregular.     Heart sounds: No murmur heard.   Pulmonary:     Effort: Pulmonary effort is normal. No respiratory distress.     Breath sounds: Examination of the right-upper field reveals wheezing. Examination of the left-upper field reveals wheezing. Examination of the right-middle field reveals wheezing. Examination of the left-middle field reveals wheezing. Examination of the right-lower field reveals wheezing. Examination of the left-lower field reveals wheezing. Wheezing present. No decreased breath sounds, rhonchi or rales.  Abdominal:     Palpations: Abdomen is soft.     Tenderness: There is no abdominal tenderness.  Musculoskeletal:     Cervical back: Neck supple.     Right lower leg: No edema.     Left lower leg: No edema.  Skin:    General: Skin is warm and dry.  Neurological:     Mental Status: He is alert.     ED Results / Procedures / Treatments   Labs (all labs ordered are listed, but only abnormal results are displayed) Labs Reviewed  BASIC METABOLIC PANEL - Abnormal; Notable for the following components:      Result Value   Chloride 94 (*)    Glucose, Bld 151 (*)    BUN 30 (*)    Creatinine, Ser 1.44 (*)     GFR, Estimated 50 (*)    All other components within normal limits  CBC - Abnormal; Notable for the following components:   WBC 3.3 (*)    RBC 3.99 (*)    MCV 102.0 (*)    MCH 34.3 (*)    All other components within normal limits  D-DIMER, QUANTITATIVE  TROPONIN I (HIGH SENSITIVITY)  TROPONIN I (HIGH SENSITIVITY)    EKG EKG Interpretation  Date/Time:  Monday Jul 05 2020 20:12:41 EDT Ventricular Rate:  97 PR Interval:    QRS Duration: 78 QT Interval:  354 QTC Calculation: 449 R Axis:   -67 Text Interpretation: Atrial fibrillation with premature ventricular or aberrantly conducted complexes Left axis deviation Abnormal ECG  similar to prior Confirmed by Lorre Munroe ([id number]) on 07/05/2020 8:32:28 PM   Radiology DG Chest 2 View  Result Date: 07/05/2020 CLINICAL DATA:  Chest pain EXAM: CHEST - 2 VIEW COMPARISON:  12/03/2019 FINDINGS: No focal opacity or pleural effusion. Mild bronchitic changes. Normal cardiomediastinal silhouette. Aortic atherosclerosis. No pneumothorax. Faint nodular opacity at the lower lungs may reflect nipple shadows. IMPRESSION: No active cardiopulmonary disease.  Bronchitic changes. Electronically Signed   By: Donavan Foil M.D.   On: 07/05/2020 20:58    Procedures Procedures   Medications Ordered in ED Medications  ipratropium-albuterol (DUONEB) 0.5-2.5 (3) MG/3ML nebulizer solution 3 mL (3 mLs Nebulization Given 07/05/20 2146)  dexamethasone (DECADRON) injection 10 mg (10 mg Intravenous Given 07/05/20 2143)    ED Course  I have reviewed the triage vital signs and the nursing notes.  Pertinent labs & imaging results that were available during my care of the patient were reviewed by me and considered in my medical decision making (see chart for details).    MDM Rules/Calculators/A&P                          [name] presents with chest pain and cough.  He was noted to have some oxygen saturations in the low 90s.  Overall, he is well-appearing.  ED  work-up was focused on evaluating him for evidence of ACS, CHF, PE.  Symptoms are most likely consistent with a COPD exacerbation for which she will be treated.  He is awaiting 1 final troponin prior to discharge. Final Clinical Impression(s) / ED Diagnoses Final diagnoses:  COPD exacerbation ([place])    Rx / DC Orders ED Discharge Orders         Ordered    predniSONE (DELTASONE) 20 MG tablet  Daily        07/05/20 2332    doxycycline (VIBRAMYCIN) 100 MG capsule  2 times daily        07/05/20 2332           Arnaldo Natal, MD 07/05/20 2332

## 2020-07-05 NOTE — Discharge Instructions (Addendum)
Your heart testing is reassuring.

## 2020-07-05 NOTE — ED Triage Notes (Addendum)
Pt states pain in left chest. Comes and goes, one time radiate to right side. States feels like a cramp. Cough noted in triage.

## 2020-07-06 LAB — TROPONIN I (HIGH SENSITIVITY): Troponin I (High Sensitivity): 12 ng/L (ref <18)

## 2020-07-06 NOTE — ED Provider Notes (Signed)
Patient signed out pending repeat troponin.  Otherwise stable for discharge per primary providing MD.  Presumed COPD exacerbation.  Repeat troponin 12 which is very reassuring.  Patient discharged per primary MD discharge instruction.   Physical Exam  BP 112/69   Pulse 80   Temp 98 F (36.7 C) (Oral)   Resp 20   Ht 1.854 m (6\' 1" )   Wt 97.1 kg   SpO2 90%   BMI 28.23 kg/m   Problem List Items Addressed This Visit   None   Visit Diagnoses    COPD exacerbation ([place])    -  Primary   Relevant Medications   ipratropium-albuterol (DUONEB) 0.5-2.5 (3) MG/3ML nebulizer solution 3 mL (Completed)   dexamethasone (DECADRON) injection 10 mg (Completed)   predniSONE (DELTASONE) 20 MG tablet        Merryl Hacker, MD 07/06/20 [phone]

## 2020-07-09 ENCOUNTER — Ambulatory Visit (INDEPENDENT_AMBULATORY_CARE_PROVIDER_SITE_OTHER): Payer: Medicare Other

## 2020-07-09 ENCOUNTER — Other Ambulatory Visit: Payer: Self-pay

## 2020-07-09 DIAGNOSIS — Z5181 Encounter for therapeutic drug level monitoring: Secondary | ICD-10-CM

## 2020-07-09 DIAGNOSIS — I4891 Unspecified atrial fibrillation: Secondary | ICD-10-CM | POA: Diagnosis not present

## 2020-07-09 DIAGNOSIS — Z7901 Long term (current) use of anticoagulants: Secondary | ICD-10-CM

## 2020-07-09 LAB — POCT INR: INR: 2.1 (ref 2.0–3.0)

## 2020-07-09 NOTE — Patient Instructions (Signed)
Continue taking 1.5 tablets daily. Repeat INR in 6 weeks. [phone]

## 2020-08-01 ENCOUNTER — Encounter (HOSPITAL_BASED_OUTPATIENT_CLINIC_OR_DEPARTMENT_OTHER): Payer: Self-pay | Admitting: Emergency Medicine

## 2020-08-01 ENCOUNTER — Other Ambulatory Visit: Payer: Self-pay

## 2020-08-01 ENCOUNTER — Emergency Department (HOSPITAL_BASED_OUTPATIENT_CLINIC_OR_DEPARTMENT_OTHER)
Admission: EM | Admit: 2020-08-01 | Discharge: 2020-08-01 | Disposition: A | Discharge status: Home / Self Care | Payer: Medicare Other | Attending: Emergency Medicine | Admitting: Emergency Medicine

## 2020-08-01 DIAGNOSIS — Z79899 Other long term (current) drug therapy: Secondary | ICD-10-CM | POA: Insufficient documentation

## 2020-08-01 DIAGNOSIS — I110 Hypertensive heart disease with heart failure: Secondary | ICD-10-CM | POA: Insufficient documentation

## 2020-08-01 DIAGNOSIS — M79645 Pain in left finger(s): Secondary | ICD-10-CM | POA: Diagnosis present

## 2020-08-01 DIAGNOSIS — J449 Chronic obstructive pulmonary disease, unspecified: Secondary | ICD-10-CM | POA: Diagnosis not present

## 2020-08-01 DIAGNOSIS — I2510 Atherosclerotic heart disease of native coronary artery without angina pectoris: Secondary | ICD-10-CM | POA: Insufficient documentation

## 2020-08-01 DIAGNOSIS — Z7901 Long term (current) use of anticoagulants: Secondary | ICD-10-CM | POA: Insufficient documentation

## 2020-08-01 DIAGNOSIS — M2548 Effusion, other site: Secondary | ICD-10-CM | POA: Insufficient documentation

## 2020-08-01 DIAGNOSIS — M25442 Effusion, left hand: Secondary | ICD-10-CM

## 2020-08-01 DIAGNOSIS — I5031 Acute diastolic (congestive) heart failure: Secondary | ICD-10-CM | POA: Insufficient documentation

## 2020-08-01 DIAGNOSIS — J45909 Unspecified asthma, uncomplicated: Secondary | ICD-10-CM | POA: Insufficient documentation

## 2020-08-01 DIAGNOSIS — F17200 Nicotine dependence, unspecified, uncomplicated: Secondary | ICD-10-CM | POA: Diagnosis not present

## 2020-08-01 DIAGNOSIS — I480 Paroxysmal atrial fibrillation: Secondary | ICD-10-CM | POA: Diagnosis not present

## 2020-08-01 HISTORY — DX: Atherosclerotic heart disease of native coronary artery without angina pectoris: I25.10

## 2020-08-01 NOTE — ED Notes (Signed)
ED Provider at bedside. Ring removed by provider

## 2020-08-01 NOTE — ED Triage Notes (Signed)
Pt arrives pov with driver, c/o L hand and 4th digit swelling. Pt has ring that he is not able to remove. Pt denies injury, endorses hx of arthritis

## 2020-08-01 NOTE — ED Provider Notes (Signed)
[facility] Provider Note   CSN: [id number] Arrival date & time: 08/01/20  1655     History Chief Complaint  Patient presents with   Hand Problem    Jake [name] is a 75 y.o. male.  75 y.o male with a PMH of left hand swelling for the past couple days.  Patient reports waking up with pain and swelling along his left ring finger, he is unable to remove his wedding band of 20 years. He does have a history of arthritis at baseline, states he tried washing his hands with soap, pulling the ring off, this has only worsened his swelling. He denies any trauma, no injury, no fever,   The history is provided by the patient.      Past Medical History:  Diagnosis Date   AAA (abdominal aortic aneurysm) without rupture ([facility])    Asthma    CHF (congestive heart failure) ([facility])    COPD (chronic obstructive pulmonary disease) ([facility])    Coronary artery disease    Hepatitis A    Hypertension    PAF (paroxysmal atrial fibrillation) ([facility])    Rheumatoid arthritis [facility])     Patient Active Problem List   Diagnosis Date Noted   Long term (current) use of anticoagulants 08/18/2019   Atrial fibrillation with rapid ventricular response ([facility])    Acute diastolic CHF (congestive heart failure) ([place])    Essential hypertension    A-fib ([place]) 07/23/2019   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   SBO (small bowel obstruction) ([place]) 07/22/2019   Plantar fasciitis of left foot 06/03/2015   Equinus deformity of foot, acquired 06/03/2015   Metatarsal deformity 06/03/2015    Past Surgical History:  Procedure Laterality Date   BACK SURGERY     FRACTURE SURGERY     JOINT REPLACEMENT     LAPAROTOMY N/A 07/25/2019   Procedure: EXPLORATORY LAPAROTOMY,REPAIR AND ANASTOMOSIS OF SMALL BOWEL, ENTEROLYSIS;  Surgeon: [name], MD;  Location: [facility];  Service: General;  Laterality: N/A;       Family History  Family history unknown: Yes    Social History   Tobacco Use   Smoking status:  Every Day    Pack years: 0.00   Smokeless tobacco: Never  Vaping Use   Vaping Use: Never used  Substance Use Topics   Alcohol use: Yes    Alcohol/week: 0.0 standard drinks    Comment: 3 beers weekly   Drug use: No    Home Medications Prior to Admission medications   Medication Sig Start Date End Date Taking? Authorizing Provider  diltiazem (CARDIZEM CD) 120 MG 24 hr capsule Take 1 capsule (120 mg total) by mouth daily. 08/08/19   Amin, Jeanella Flattery, MD  doxycycline (VIBRAMYCIN) 100 MG capsule Take 1 capsule (100 mg total) by mouth 2 (two) times daily. 07/05/20   Arnaldo Natal, MD  folic acid (FOLVITE) 1 MG tablet Take 1 mg by mouth daily. 04/29/19   [provider]  furosemide (LASIX) 20 MG tablet Take 1 tablet (20 mg total) by mouth as needed (TAKE AS NEEDED FOR WEIGHT GAIN 3-5 POUNDS- OR LEG SWELLING). 12/08/19 03/07/20  Elouise Munroe, MD  gabapentin (NEURONTIN) 300 MG capsule Take 300 mg by mouth 2 (two) times daily. Reported on 06/03/2015    [provider]  HYDROcodone-acetaminophen (NORCO/VICODIN) 5-325 MG tablet Take 1 tablet by mouth every 6 (six) hours as needed. 01/15/19   Virgel Manifold, MD  magnesium oxide (MAG-OX) 400 MG tablet Take 400 mg by  mouth daily.     [provider]  VENTOLIN HFA 108 (90 Base) MCG/ACT inhaler Inhale 1-2 puffs into the lungs every 4 (four) hours as needed.  05/30/19   [provider]  warfarin (COUMADIN) 5 MG tablet Take 1-2 tablets daily or as prescribed by Clinic 05/28/20   Elouise Munroe, MD    Allergies    Propoxyphene  Review of Systems   Review of Systems  Constitutional:  Negative for fever.  Musculoskeletal:  Positive for arthralgias.  Skin:  Negative for color change, pallor, rash and wound.   Physical Exam Updated Vital Signs BP 130/76 (BP Location: Right Arm)   Pulse 91   Temp 98.7 F (37.1 C) (Oral)   Resp 18   Ht 6\' 1"  (1.854 m)   Wt 97.5 kg   SpO2 94%   BMI 28.37 kg/m   Physical  Exam Vitals and nursing note reviewed.  Constitutional:      Appearance: Normal appearance.  HENT:     Head: Normocephalic and atraumatic.     Mouth/Throat:     Mouth: Mucous membranes are moist.  Cardiovascular:     Rate and Rhythm: Normal rate.  Pulmonary:     Effort: Pulmonary effort is normal.  Abdominal:     General: Abdomen is flat.  Musculoskeletal:     Left hand: Swelling, tenderness and bony tenderness present. No lacerations. Normal strength. Normal sensation. There is no disruption of two-point discrimination.     Cervical back: Normal range of motion and neck supple.  Skin:    General: Skin is warm and dry.     Findings: Erythema present.  Neurological:     Mental Status: He is alert and oriented to person, place, and time.    ED Results / Procedures / Treatments   Labs (all labs ordered are listed, but only abnormal results are displayed) Labs Reviewed - No data to display  EKG None  Radiology No results found.  Procedures Procedures   Medications Ordered in ED Medications - No data to display  ED Course  I have reviewed the triage vital signs and the nursing notes.  Pertinent labs & imaging results that were available during my care of the patient were reviewed by me and considered in my medical decision making (see chart for details).    MDM Rules/Calculators/A&P      Patient with an extensive medical history here for left hand swelling.  This has been ongoing for the last couple days.  He attempted to remove his wedding band, was unable to do so at home.  Attempt it with soap, finger continues to get worsening swelling.  Does have a prior history of arthritis.  During evaluation patient has wedding band attached to his left ring finger, this was removed using a rubber band along with glue.  Wedding ring is intact.  He reports present pain after removal, has full range of motion of the left ring finger.  Patient stable for discharge   Portions of  this note were generated with Dragon dictation software. Dictation errors may occur despite best attempts at proofreading.  Final Clinical Impression(s) / ED Diagnoses Final diagnoses:  Finger joint swelling, left    Rx / DC Orders ED Discharge Orders     None        Janeece Fitting, Hershal Coria 08/01/20 1809    Blanchie Dessert, MD 08/01/20 2211

## 2020-08-01 NOTE — ED Notes (Signed)
Left hand mildly swollen.  Wedding band tight causing additional swelling and discomfort.  Requests band to be cut off.

## 2020-08-30 ENCOUNTER — Ambulatory Visit (INDEPENDENT_AMBULATORY_CARE_PROVIDER_SITE_OTHER): Payer: Medicare Other

## 2020-08-30 ENCOUNTER — Other Ambulatory Visit: Payer: Self-pay

## 2020-08-30 DIAGNOSIS — Z7901 Long term (current) use of anticoagulants: Secondary | ICD-10-CM

## 2020-08-30 DIAGNOSIS — Z5181 Encounter for therapeutic drug level monitoring: Secondary | ICD-10-CM | POA: Diagnosis not present

## 2020-08-30 DIAGNOSIS — I4891 Unspecified atrial fibrillation: Secondary | ICD-10-CM | POA: Diagnosis not present

## 2020-08-30 LAB — POCT INR: INR: 2 (ref 2.0–3.0)

## 2020-08-30 NOTE — Patient Instructions (Signed)
Continue taking 1.5 tablets daily. Repeat INR in 6 weeks. [phone]

## 2020-09-20 ENCOUNTER — Other Ambulatory Visit: Payer: Self-pay | Admitting: Internal Medicine

## 2020-09-27 ENCOUNTER — Other Ambulatory Visit: Payer: Self-pay

## 2020-09-27 MED ORDER — WARFARIN SODIUM 5 MG PO TABS: ORAL_TABLET | ORAL | 1 refills | Status: DC

## 2020-10-11 ENCOUNTER — Other Ambulatory Visit: Payer: Self-pay

## 2020-10-11 ENCOUNTER — Ambulatory Visit (INDEPENDENT_AMBULATORY_CARE_PROVIDER_SITE_OTHER): Payer: Medicare Other

## 2020-10-11 DIAGNOSIS — I4891 Unspecified atrial fibrillation: Secondary | ICD-10-CM | POA: Diagnosis not present

## 2020-10-11 DIAGNOSIS — Z7901 Long term (current) use of anticoagulants: Secondary | ICD-10-CM | POA: Diagnosis not present

## 2020-10-11 LAB — POCT INR: INR: 2.1 (ref 2.0–3.0)

## 2020-10-11 NOTE — Patient Instructions (Signed)
Description   Continue taking 1.5 tablets daily. Repeat INR in 6 weeks. [phone]

## 2020-11-22 ENCOUNTER — Ambulatory Visit (INDEPENDENT_AMBULATORY_CARE_PROVIDER_SITE_OTHER): Payer: Medicare Other

## 2020-11-22 ENCOUNTER — Other Ambulatory Visit: Payer: Self-pay

## 2020-11-22 DIAGNOSIS — Z5181 Encounter for therapeutic drug level monitoring: Secondary | ICD-10-CM | POA: Diagnosis not present

## 2020-11-22 DIAGNOSIS — I4891 Unspecified atrial fibrillation: Secondary | ICD-10-CM

## 2020-11-22 DIAGNOSIS — Z7901 Long term (current) use of anticoagulants: Secondary | ICD-10-CM

## 2020-11-22 LAB — POCT INR: INR: 3.4 — AB (ref 2.0–3.0)

## 2020-11-22 NOTE — Patient Instructions (Signed)
Hold tonight only and then Continue taking 1.5 tablets daily. Repeat INR in 4 weeks. [phone]

## 2020-12-27 ENCOUNTER — Other Ambulatory Visit: Payer: Self-pay

## 2020-12-27 ENCOUNTER — Ambulatory Visit (INDEPENDENT_AMBULATORY_CARE_PROVIDER_SITE_OTHER): Payer: Medicare Other

## 2020-12-27 DIAGNOSIS — Z7901 Long term (current) use of anticoagulants: Secondary | ICD-10-CM | POA: Diagnosis not present

## 2020-12-27 DIAGNOSIS — I4891 Unspecified atrial fibrillation: Secondary | ICD-10-CM | POA: Diagnosis not present

## 2020-12-27 DIAGNOSIS — Z5181 Encounter for therapeutic drug level monitoring: Secondary | ICD-10-CM

## 2020-12-27 LAB — POCT INR: INR: 2.2 (ref 2.0–3.0)

## 2020-12-27 MED ORDER — WARFARIN SODIUM 5 MG PO TABS: ORAL_TABLET | ORAL | 1 refills | Status: DC

## 2020-12-27 NOTE — Patient Instructions (Signed)
Continue taking 1.5 tablets daily. Repeat INR in 6 weeks. [phone]

## 2021-02-08 ENCOUNTER — Other Ambulatory Visit: Payer: Self-pay

## 2021-02-08 ENCOUNTER — Ambulatory Visit (INDEPENDENT_AMBULATORY_CARE_PROVIDER_SITE_OTHER): Payer: Medicare Other | Admitting: *Deleted

## 2021-02-08 DIAGNOSIS — Z5181 Encounter for therapeutic drug level monitoring: Secondary | ICD-10-CM | POA: Diagnosis not present

## 2021-02-08 DIAGNOSIS — Z7901 Long term (current) use of anticoagulants: Secondary | ICD-10-CM

## 2021-02-08 DIAGNOSIS — I4891 Unspecified atrial fibrillation: Secondary | ICD-10-CM

## 2021-02-08 LAB — POCT INR: INR: 3.4 — AB (ref 2.0–3.0)

## 2021-02-08 NOTE — Patient Instructions (Signed)
Description    Hold warfarin today and then continue taking warfarin 1.5 tablets daily. Recheck INR in 3 weeks. Coumadin Clinic [phone]

## 2021-03-01 ENCOUNTER — Other Ambulatory Visit: Payer: Self-pay

## 2021-03-01 ENCOUNTER — Ambulatory Visit (INDEPENDENT_AMBULATORY_CARE_PROVIDER_SITE_OTHER): Payer: Medicare Other | Admitting: *Deleted

## 2021-03-01 DIAGNOSIS — Z7901 Long term (current) use of anticoagulants: Secondary | ICD-10-CM

## 2021-03-01 DIAGNOSIS — I4891 Unspecified atrial fibrillation: Secondary | ICD-10-CM | POA: Diagnosis not present

## 2021-03-01 DIAGNOSIS — Z5181 Encounter for therapeutic drug level monitoring: Secondary | ICD-10-CM | POA: Diagnosis not present

## 2021-03-01 LAB — POCT INR: INR: 3.5 — AB (ref 2.0–3.0)

## 2021-03-01 NOTE — Patient Instructions (Signed)
Description    Hold warfarin today and then START taking warfarin 1.5 tablets daily except for 1 tablet on Tuesdays. Recheck INR in 2 weeks. Coumadin Clinic [phone]

## 2021-03-15 ENCOUNTER — Ambulatory Visit (INDEPENDENT_AMBULATORY_CARE_PROVIDER_SITE_OTHER): Payer: Medicare Other | Admitting: *Deleted

## 2021-03-15 ENCOUNTER — Telehealth: Payer: Self-pay | Admitting: *Deleted

## 2021-03-15 ENCOUNTER — Other Ambulatory Visit: Payer: Self-pay

## 2021-03-15 DIAGNOSIS — Z7901 Long term (current) use of anticoagulants: Secondary | ICD-10-CM | POA: Diagnosis not present

## 2021-03-15 DIAGNOSIS — I4891 Unspecified atrial fibrillation: Secondary | ICD-10-CM

## 2021-03-15 DIAGNOSIS — Z5181 Encounter for therapeutic drug level monitoring: Secondary | ICD-10-CM

## 2021-03-15 LAB — POCT INR: INR: 6.9 — AB (ref 2.0–3.0)

## 2021-03-15 LAB — PROTIME-INR
INR: 7.3 (ref 0.9–1.2)
Prothrombin Time: 67.7 s — ABNORMAL HIGH (ref 9.1–12.0)

## 2021-03-15 MED ORDER — WARFARIN SODIUM 5 MG PO TABS: ORAL_TABLET | ORAL | 1 refills | Status: DC

## 2021-03-15 NOTE — Telephone Encounter (Signed)
Saw pt and at the INR check today. Pt stated that he was not taking Cardizem. Pt stated that he does not know when he stopped taking Cardizem, he said he just ran out. Looks like a 30 day supply of Cardizem was ordered on 08/07/2019. Per Dr Delphina Cahill office note on 06/18/2020 it says the following:   Persistent atrial fibrillation ([place]) Secondary hypercoagulable state ([place]) Long term (current) use of anticoagulants -Continues on anticoagulation with warfarin for a CHA2DS2-VASc score of 3 for hypertension and age.  No bleeding events, tolerating well. - Rate control with diltiazem 120 mg daily, continue

## 2021-03-15 NOTE — Progress Notes (Signed)
INR high today.  Addressed in Coumadin clinic.  Glenetta Hew, MD .

## 2021-03-15 NOTE — Telephone Encounter (Signed)
Spoke with patient about diltiazem - he is unsure how long he has been off of diltiazem   His last visit was 05/2020 - BP and pulse rate were good  Note said to continue diltiazem  Last fill was 07/2019 but not by our cardiology clinic team   He does not check BP/pulse at home but has a cuff he just needs to locate  Asked that he check readings for a few days and call back so we can advise what to do about his medication

## 2021-03-15 NOTE — Patient Instructions (Signed)
Description   Called and spoke to pt to do the following: -Hold warfarin 1/24, 1/25, 1/26 -Then start taking warfarin 1.5 tablets daily excpet for 1 tablet on Tuesday, Thursday and Saturday. Recheck INR on 1/27. Coumadin Clinic [phone]

## 2021-03-15 NOTE — Telephone Encounter (Addendum)
BP and pulse not checked during INR visit No home BP/pulse readings provided during this visit Patient had to present to lab for repeat INR as POC INR was 6.9  Will need to call patient about home BP/pulse readings since diltiazem discrepancy was noted

## 2021-03-18 ENCOUNTER — Ambulatory Visit (INDEPENDENT_AMBULATORY_CARE_PROVIDER_SITE_OTHER): Payer: Medicare Other

## 2021-03-18 ENCOUNTER — Other Ambulatory Visit: Payer: Self-pay

## 2021-03-18 DIAGNOSIS — Z5181 Encounter for therapeutic drug level monitoring: Secondary | ICD-10-CM | POA: Diagnosis not present

## 2021-03-18 DIAGNOSIS — I4891 Unspecified atrial fibrillation: Secondary | ICD-10-CM

## 2021-03-18 DIAGNOSIS — Z7901 Long term (current) use of anticoagulants: Secondary | ICD-10-CM

## 2021-03-18 LAB — POCT INR: INR: 1.5 — AB (ref 2.0–3.0)

## 2021-03-18 NOTE — Patient Instructions (Signed)
-   TAKE 2 TABLETS TODAY ONLY -Then start taking warfarin 1.5 tablets daily except for 1 tablet on Tuesday, Thursday and Saturday. Recheck INR in 2 weeks. Coumadin Clinic [phone]

## 2021-03-18 NOTE — Telephone Encounter (Signed)
Per staff message- will forward to MD to make aware and review.   Frederik Schmidt, RN  Athea Haley, Belinda Block, RN Good morning.. Just checked patient's BP 140/86  and HR 72 bpm...  O2 Sat 92%.   Thank you,   Legrand Como

## 2021-03-23 NOTE — Telephone Encounter (Signed)
Attempted to call patient, left message for patient to call back to office.   

## 2021-04-01 ENCOUNTER — Other Ambulatory Visit: Payer: Self-pay

## 2021-04-01 ENCOUNTER — Ambulatory Visit (INDEPENDENT_AMBULATORY_CARE_PROVIDER_SITE_OTHER): Payer: Medicare Other

## 2021-04-01 DIAGNOSIS — Z7901 Long term (current) use of anticoagulants: Secondary | ICD-10-CM | POA: Diagnosis not present

## 2021-04-01 DIAGNOSIS — Z5181 Encounter for therapeutic drug level monitoring: Secondary | ICD-10-CM | POA: Diagnosis not present

## 2021-04-01 DIAGNOSIS — I4891 Unspecified atrial fibrillation: Secondary | ICD-10-CM

## 2021-04-01 LAB — POCT INR: INR: 2.9 (ref 2.0–3.0)

## 2021-04-01 NOTE — Patient Instructions (Signed)
-  Continue taking warfarin 1.5 tablets daily except for 1 tablet on Tuesday, Thursday and Saturday. Recheck INR in 6 weeks. Coumadin Clinic [phone]

## 2021-04-26 ENCOUNTER — Other Ambulatory Visit: Payer: Self-pay | Admitting: Internal Medicine

## 2021-04-27 NOTE — Telephone Encounter (Signed)
Returned call to patient and advised him of Dr. Rosezella [place] recommendations.  ? ?Minus Breeding, MD  You 1 month ago  ? ?I don't see any changes I would make based on this.     ? ?Scheduled patient to see Dr. Margaretann Loveless on 06/10/21. Advised patient to monitor BP/HR daily and bring to that appointment. Advised patient to call the office with any issues with elevated heart rate or blood pressure. Patient aware and verbalized understanding.  ?

## 2021-05-13 ENCOUNTER — Other Ambulatory Visit: Payer: Self-pay

## 2021-05-13 ENCOUNTER — Ambulatory Visit (INDEPENDENT_AMBULATORY_CARE_PROVIDER_SITE_OTHER): Payer: Medicare Other

## 2021-05-13 DIAGNOSIS — I4891 Unspecified atrial fibrillation: Secondary | ICD-10-CM

## 2021-05-13 DIAGNOSIS — Z7901 Long term (current) use of anticoagulants: Secondary | ICD-10-CM | POA: Diagnosis not present

## 2021-05-13 DIAGNOSIS — Z5181 Encounter for therapeutic drug level monitoring: Secondary | ICD-10-CM

## 2021-05-13 LAB — POCT INR: INR: 2 (ref 2.0–3.0)

## 2021-05-13 NOTE — Patient Instructions (Signed)
-  Continue taking warfarin 1.5 tablets daily except for 1 tablet on Tuesday, Thursday and Saturday. Recheck INR in 6 weeks. Coumadin Clinic [phone] ?

## 2021-05-25 ENCOUNTER — Emergency Department (HOSPITAL_BASED_OUTPATIENT_CLINIC_OR_DEPARTMENT_OTHER): Payer: Medicare Other

## 2021-05-25 ENCOUNTER — Other Ambulatory Visit: Payer: Self-pay

## 2021-05-25 ENCOUNTER — Inpatient Hospital Stay (HOSPITAL_BASED_OUTPATIENT_CLINIC_OR_DEPARTMENT_OTHER)
Admission: EM | Admit: 2021-05-25 | Discharge: 2021-06-11 | DRG: 291 | Disposition: A | Discharge status: Home Health | Payer: Medicare Other | Attending: Internal Medicine | Admitting: Internal Medicine

## 2021-05-25 DIAGNOSIS — D539 Nutritional anemia, unspecified: Secondary | ICD-10-CM | POA: Diagnosis present

## 2021-05-25 DIAGNOSIS — J441 Chronic obstructive pulmonary disease with (acute) exacerbation: Secondary | ICD-10-CM | POA: Diagnosis present

## 2021-05-25 DIAGNOSIS — G629 Polyneuropathy, unspecified: Secondary | ICD-10-CM | POA: Diagnosis present

## 2021-05-25 DIAGNOSIS — E669 Obesity, unspecified: Secondary | ICD-10-CM | POA: Diagnosis present

## 2021-05-25 DIAGNOSIS — E871 Hypo-osmolality and hyponatremia: Secondary | ICD-10-CM | POA: Diagnosis present

## 2021-05-25 DIAGNOSIS — I2781 Cor pulmonale (chronic): Secondary | ICD-10-CM | POA: Diagnosis present

## 2021-05-25 DIAGNOSIS — I509 Heart failure, unspecified: Secondary | ICD-10-CM | POA: Insufficient documentation

## 2021-05-25 DIAGNOSIS — G4733 Obstructive sleep apnea (adult) (pediatric): Secondary | ICD-10-CM | POA: Diagnosis present

## 2021-05-25 DIAGNOSIS — D631 Anemia in chronic kidney disease: Secondary | ICD-10-CM | POA: Diagnosis present

## 2021-05-25 DIAGNOSIS — J9811 Atelectasis: Secondary | ICD-10-CM | POA: Diagnosis not present

## 2021-05-25 DIAGNOSIS — Z20822 Contact with and (suspected) exposure to covid-19: Secondary | ICD-10-CM | POA: Diagnosis present

## 2021-05-25 DIAGNOSIS — R6520 Severe sepsis without septic shock: Secondary | ICD-10-CM | POA: Diagnosis not present

## 2021-05-25 DIAGNOSIS — T451X5S Adverse effect of antineoplastic and immunosuppressive drugs, sequela: Secondary | ICD-10-CM

## 2021-05-25 DIAGNOSIS — I272 Pulmonary hypertension, unspecified: Secondary | ICD-10-CM | POA: Diagnosis present

## 2021-05-25 DIAGNOSIS — I482 Chronic atrial fibrillation, unspecified: Secondary | ICD-10-CM

## 2021-05-25 DIAGNOSIS — M069 Rheumatoid arthritis, unspecified: Secondary | ICD-10-CM | POA: Diagnosis present

## 2021-05-25 DIAGNOSIS — Z923 Personal history of irradiation: Secondary | ICD-10-CM

## 2021-05-25 DIAGNOSIS — Z7952 Long term (current) use of systemic steroids: Secondary | ICD-10-CM

## 2021-05-25 DIAGNOSIS — I5033 Acute on chronic diastolic (congestive) heart failure: Secondary | ICD-10-CM | POA: Diagnosis present

## 2021-05-25 DIAGNOSIS — D696 Thrombocytopenia, unspecified: Secondary | ICD-10-CM | POA: Diagnosis present

## 2021-05-25 DIAGNOSIS — I4821 Permanent atrial fibrillation: Secondary | ICD-10-CM | POA: Diagnosis present

## 2021-05-25 DIAGNOSIS — Z79899 Other long term (current) drug therapy: Secondary | ICD-10-CM

## 2021-05-25 DIAGNOSIS — Z87898 Personal history of other specified conditions: Secondary | ICD-10-CM

## 2021-05-25 DIAGNOSIS — I2510 Atherosclerotic heart disease of native coronary artery without angina pectoris: Secondary | ICD-10-CM | POA: Diagnosis present

## 2021-05-25 DIAGNOSIS — G9341 Metabolic encephalopathy: Secondary | ICD-10-CM | POA: Diagnosis not present

## 2021-05-25 DIAGNOSIS — N182 Chronic kidney disease, stage 2 (mild): Secondary | ICD-10-CM | POA: Diagnosis present

## 2021-05-25 DIAGNOSIS — D509 Iron deficiency anemia, unspecified: Secondary | ICD-10-CM | POA: Diagnosis present

## 2021-05-25 DIAGNOSIS — F17210 Nicotine dependence, cigarettes, uncomplicated: Secondary | ICD-10-CM | POA: Diagnosis present

## 2021-05-25 DIAGNOSIS — I10 Essential (primary) hypertension: Secondary | ICD-10-CM | POA: Diagnosis not present

## 2021-05-25 DIAGNOSIS — E860 Dehydration: Secondary | ICD-10-CM | POA: Diagnosis not present

## 2021-05-25 DIAGNOSIS — K717 Toxic liver disease with fibrosis and cirrhosis of liver: Secondary | ICD-10-CM | POA: Diagnosis present

## 2021-05-25 DIAGNOSIS — J9621 Acute and chronic respiratory failure with hypoxia: Secondary | ICD-10-CM | POA: Diagnosis not present

## 2021-05-25 DIAGNOSIS — I130 Hypertensive heart and chronic kidney disease with heart failure and stage 1 through stage 4 chronic kidney disease, or unspecified chronic kidney disease: Principal | ICD-10-CM | POA: Diagnosis present

## 2021-05-25 DIAGNOSIS — J690 Pneumonitis due to inhalation of food and vomit: Secondary | ICD-10-CM | POA: Diagnosis present

## 2021-05-25 DIAGNOSIS — A419 Sepsis, unspecified organism: Secondary | ICD-10-CM | POA: Diagnosis not present

## 2021-05-25 DIAGNOSIS — I959 Hypotension, unspecified: Secondary | ICD-10-CM | POA: Diagnosis not present

## 2021-05-25 DIAGNOSIS — C3490 Malignant neoplasm of unspecified part of unspecified bronchus or lung: Secondary | ICD-10-CM | POA: Diagnosis present

## 2021-05-25 DIAGNOSIS — Z6828 Body mass index (BMI) 28.0-28.9, adult: Secondary | ICD-10-CM

## 2021-05-25 DIAGNOSIS — E66811 Obesity, class 1: Secondary | ICD-10-CM | POA: Diagnosis present

## 2021-05-25 DIAGNOSIS — Z7901 Long term (current) use of anticoagulants: Secondary | ICD-10-CM

## 2021-05-25 DIAGNOSIS — D649 Anemia, unspecified: Secondary | ICD-10-CM | POA: Diagnosis present

## 2021-05-25 DIAGNOSIS — N179 Acute kidney failure, unspecified: Secondary | ICD-10-CM | POA: Diagnosis present

## 2021-05-25 DIAGNOSIS — I5031 Acute diastolic (congestive) heart failure: Secondary | ICD-10-CM | POA: Diagnosis not present

## 2021-05-25 DIAGNOSIS — N189 Chronic kidney disease, unspecified: Secondary | ICD-10-CM | POA: Diagnosis not present

## 2021-05-25 DIAGNOSIS — I4891 Unspecified atrial fibrillation: Secondary | ICD-10-CM | POA: Diagnosis present

## 2021-05-25 HISTORY — DX: Permanent atrial fibrillation: I48.21

## 2021-05-25 HISTORY — DX: Atherosclerotic heart disease of native coronary artery without angina pectoris: I25.10

## 2021-05-25 HISTORY — DX: Chronic respiratory failure, unspecified whether with hypoxia or hypercapnia: J96.10

## 2021-05-25 HISTORY — DX: Malignant neoplasm of unspecified part of unspecified bronchus or lung: C34.90

## 2021-05-25 HISTORY — DX: Obstructive sleep apnea (adult) (pediatric): G47.33

## 2021-05-25 HISTORY — DX: Anemia, unspecified: D64.9

## 2021-05-25 HISTORY — DX: Chronic diastolic (congestive) heart failure: I50.32

## 2021-05-25 HISTORY — DX: Chronic kidney disease, stage 2 (mild): N18.2

## 2021-05-25 HISTORY — DX: Pulmonary hypertension, unspecified: I27.20

## 2021-05-25 HISTORY — DX: Thrombocytopenia, unspecified: D69.6

## 2021-05-25 HISTORY — DX: Cor pulmonale (chronic): I27.81

## 2021-05-25 LAB — CBC WITH DIFFERENTIAL/PLATELET
Abs Immature Granulocytes: 0.06 10*3/uL (ref 0.00–0.07)
Basophils Absolute: 0 10*3/uL (ref 0.0–0.1)
Basophils Relative: 0 %
Eosinophils Absolute: 0 10*3/uL (ref 0.0–0.5)
Eosinophils Relative: 0 %
HCT: 32 % — ABNORMAL LOW (ref 39.0–52.0)
Hemoglobin: 10 g/dL — ABNORMAL LOW (ref 13.0–17.0)
Immature Granulocytes: 1 %
Lymphocytes Relative: 6 %
Lymphs Abs: 0.4 10*3/uL — ABNORMAL LOW (ref 0.7–4.0)
MCH: 34.5 pg — ABNORMAL HIGH (ref 26.0–34.0)
MCHC: 31.3 g/dL (ref 30.0–36.0)
MCV: 110.3 fL — ABNORMAL HIGH (ref 80.0–100.0)
Monocytes Absolute: 0.6 10*3/uL (ref 0.1–1.0)
Monocytes Relative: 9 %
Neutro Abs: 5.3 10*3/uL (ref 1.7–7.7)
Neutrophils Relative %: 84 %
Platelets: 91 10*3/uL — ABNORMAL LOW (ref 150–400)
RBC: 2.9 MIL/uL — ABNORMAL LOW (ref 4.22–5.81)
RDW: 15.8 % — ABNORMAL HIGH (ref 11.5–15.5)
Smear Review: DECREASED
WBC: 6.3 10*3/uL (ref 4.0–10.5)
nRBC: 0 % (ref 0.0–0.2)

## 2021-05-25 LAB — COMPREHENSIVE METABOLIC PANEL
ALT: 20 U/L (ref 0–44)
AST: 19 U/L (ref 15–41)
Albumin: 3.2 g/dL — ABNORMAL LOW (ref 3.5–5.0)
Alkaline Phosphatase: 42 U/L (ref 38–126)
Anion gap: 7 (ref 5–15)
BUN: 27 mg/dL — ABNORMAL HIGH (ref 8–23)
CO2: 32 mmol/L (ref 22–32)
Calcium: 8.2 mg/dL — ABNORMAL LOW (ref 8.9–10.3)
Chloride: 98 mmol/L (ref 98–111)
Creatinine, Ser: 1.22 mg/dL (ref 0.61–1.24)
GFR, Estimated: >60 mL/min (ref >60)
Glucose, Bld: 162 mg/dL — ABNORMAL HIGH (ref 70–99)
Potassium: 4.1 mmol/L (ref 3.5–5.1)
Sodium: 137 mmol/L (ref 135–145)
Total Bilirubin: 1 mg/dL (ref 0.3–1.2)
Total Protein: 5.8 g/dL — ABNORMAL LOW (ref 6.5–8.1)

## 2021-05-25 LAB — TROPONIN I (HIGH SENSITIVITY)
Troponin I (High Sensitivity): 29 ng/L — ABNORMAL HIGH (ref <18)
Troponin I (High Sensitivity): 29 ng/L — ABNORMAL HIGH (ref <18)

## 2021-05-25 LAB — PROTIME-INR
INR: 2 — ABNORMAL HIGH (ref 0.8–1.2)
Prothrombin Time: 22.4 seconds — ABNORMAL HIGH (ref 11.4–15.2)

## 2021-05-25 LAB — BRAIN NATRIURETIC PEPTIDE: B Natriuretic Peptide: 570.9 pg/mL — ABNORMAL HIGH (ref 0.0–100.0)

## 2021-05-25 MED ORDER — ALBUTEROL SULFATE (2.5 MG/3ML) 0.083% IN NEBU
3.0000 mL | INHALATION_SOLUTION | RESPIRATORY_TRACT | Status: DC | PRN
  Administered 2021-05-29 – 2021-06-03 (×4): 3 mL via RESPIRATORY_TRACT
  Filled 2021-05-25 (×4): qty 3

## 2021-05-25 MED ORDER — DILTIAZEM HCL ER COATED BEADS 120 MG PO CP24
120.0000 mg | ORAL_CAPSULE | Freq: Every day | ORAL | Status: DC
  Administered 2021-05-26 – 2021-05-27 (×2): 120 mg via ORAL
  Filled 2021-05-25 (×2): qty 1

## 2021-05-25 MED ORDER — FUROSEMIDE 20 MG PO TABS: 20.0000 mg | ORAL_TABLET | ORAL | Status: DC | PRN

## 2021-05-25 MED ORDER — MAGNESIUM OXIDE -MG SUPPLEMENT 400 (240 MG) MG PO TABS
400.0000 mg | ORAL_TABLET | Freq: Every day | ORAL | Status: DC
  Administered 2021-05-26 – 2021-06-11 (×17): 400 mg via ORAL
  Filled 2021-05-25 (×18): qty 1

## 2021-05-25 MED ORDER — ACETAMINOPHEN 650 MG RE SUPP: 650.0000 mg | Freq: Four times a day (QID) | RECTAL | Status: DC | PRN

## 2021-05-25 MED ORDER — SULFASALAZINE 500 MG PO TABS
500.0000 mg | ORAL_TABLET | Freq: Every day | ORAL | Status: DC
  Administered 2021-05-26 – 2021-06-11 (×17): 500 mg via ORAL
  Filled 2021-05-25 (×17): qty 1

## 2021-05-25 MED ORDER — GABAPENTIN 300 MG PO CAPS
300.0000 mg | ORAL_CAPSULE | Freq: Two times a day (BID) | ORAL | Status: DC
  Administered 2021-05-25 – 2021-06-11 (×34): 300 mg via ORAL
  Filled 2021-05-25 (×34): qty 1

## 2021-05-25 MED ORDER — TRAZODONE HCL 50 MG PO TABS
25.0000 mg | ORAL_TABLET | Freq: Every evening | ORAL | Status: DC | PRN
  Administered 2021-05-27 – 2021-05-29 (×3): 25 mg via ORAL
  Filled 2021-05-25 (×3): qty 1

## 2021-05-25 MED ORDER — FOLIC ACID 1 MG PO TABS
1.0000 mg | ORAL_TABLET | Freq: Every day | ORAL | Status: DC
  Administered 2021-05-26 – 2021-06-11 (×17): 1 mg via ORAL
  Filled 2021-05-25 (×17): qty 1

## 2021-05-25 MED ORDER — PREDNISONE 10 MG PO TABS
10.0000 mg | ORAL_TABLET | Freq: Every day | ORAL | Status: DC
  Administered 2021-05-26: 10 mg via ORAL
  Filled 2021-05-25: qty 1

## 2021-05-25 MED ORDER — ONDANSETRON HCL 4 MG/2ML IJ SOLN: 4.0000 mg | Freq: Four times a day (QID) | INTRAMUSCULAR | Status: DC | PRN

## 2021-05-25 MED ORDER — FUROSEMIDE 10 MG/ML IJ SOLN
40.0000 mg | Freq: Two times a day (BID) | INTRAMUSCULAR | Status: DC
  Administered 2021-05-26 – 2021-05-27 (×3): 40 mg via INTRAVENOUS
  Filled 2021-05-25 (×2): qty 4

## 2021-05-25 MED ORDER — ACETAMINOPHEN 325 MG PO TABS
650.0000 mg | ORAL_TABLET | Freq: Four times a day (QID) | ORAL | Status: DC | PRN
Start: 2021-05-25 — End: 2021-06-11
  Administered 2021-05-29 – 2021-06-03 (×3): 650 mg via ORAL
  Filled 2021-05-25 (×3): qty 2

## 2021-05-25 MED ORDER — FUROSEMIDE 10 MG/ML IJ SOLN
40.0000 mg | Freq: Once | INTRAMUSCULAR | Status: AC
  Administered 2021-05-25: 40 mg via INTRAVENOUS
  Filled 2021-05-25: qty 4

## 2021-05-25 MED ORDER — ONDANSETRON HCL 4 MG PO TABS: 4.0000 mg | ORAL_TABLET | Freq: Four times a day (QID) | ORAL | Status: DC | PRN

## 2021-05-25 MED ORDER — METHYLPREDNISOLONE SODIUM SUCC 125 MG IJ SOLR
125.0000 mg | Freq: Once | INTRAMUSCULAR | Status: AC
  Administered 2021-05-25: 125 mg via INTRAVENOUS
  Filled 2021-05-25: qty 2

## 2021-05-25 MED ORDER — MAGNESIUM HYDROXIDE 400 MG/5ML PO SUSP: 30.0000 mL | Freq: Every day | ORAL | Status: DC | PRN

## 2021-05-25 MED ORDER — ALBUTEROL SULFATE (2.5 MG/3ML) 0.083% IN NEBU
5.0000 mg | INHALATION_SOLUTION | Freq: Once | RESPIRATORY_TRACT | Status: AC
  Administered 2021-05-25: 5 mg via RESPIRATORY_TRACT
  Filled 2021-05-25: qty 6

## 2021-05-25 MED ORDER — VITAMIN B-12 1000 MCG PO TABS
1000.0000 ug | ORAL_TABLET | Freq: Every day | ORAL | Status: DC
  Administered 2021-05-26 – 2021-06-11 (×17): 1000 ug via ORAL
  Filled 2021-05-25 (×17): qty 1

## 2021-05-25 MED ORDER — WARFARIN - PHARMACIST DOSING INPATIENT: Freq: Every day | Status: DC

## 2021-05-25 MED ORDER — WARFARIN SODIUM 7.5 MG PO TABS
7.5000 mg | ORAL_TABLET | Freq: Once | ORAL | Status: AC
  Administered 2021-05-25: 7.5 mg via ORAL
  Filled 2021-05-25: qty 1

## 2021-05-25 NOTE — H&P (Addendum)
?  ?  ?[facility] ? ? ?PATIENT NAME: Jake [name]   ? ?MR#:  [id number] ? ?DATE OF BIRTH:  1946-02-11 ? ?DATE OF ADMISSION:  05/25/2021 ? ?PRIMARY CARE PHYSICIAN: Glendon Axe, MD  ? ?Patient is coming from: Home ? ?REQUESTING/REFERRING PHYSICIAN: Eustaquio Maize, PA-C ([place] [facility]) ? ?CHIEF COMPLAINT:  ? ?Chief Complaint  ?Patient presents with  ? Leg Swelling  ?-Shortness of breath ? ?HISTORY OF PRESENT ILLNESS:  ?Jake [name] is a 76 y.o. Caucasian male with medical history significant for diastolic CHF, COPD, coronary artery disease, hypertension, paroxysmal atrial fibrillation on Coumadin, AAA, rheumatoid arthritis and asthma who presented to the ER with acute onset of worsening bilateral lower extremity edema.  The patient was evaluated by home health today and they recommended that he comes to the ER.  Per his daughter's edema has been worsening over the last 6 weeks.  His right leg was weeping yesterday and she has applied a Ace bandage to and today his left leg began weeping.  He has been having worsening dyspnea.  He wears home O2 on a as needed basis.  He admits to two-pillow orthopnea.  He admits to dry cough occasionally productive of clear sputum and wheezing.  He denies any paroxysmal nocturnal dyspnea.  No fever or chills.  He admitted to gaining 20 pounds over the last couple weeks.  No chest pain or palpitations.   No nausea vomiting or abdominal pain.  No dysuria, oliguria or hematuria or flank pain. ? ?He had 2 recent hospitalizations at [facility] within the month of March.  Initially patient was hospitalized from 3/16 until 3/24 and was discharged home on supplemental oxygen.  Patient was then rehospitalized at the same hospital from 3/25 until 3/28 for rapid atrial fibrillation, acute COPD exacerbation and acute on chronic diastolic congestive heart failure. ? ?ED Course: When he came to the ED, BP was 101/57 with respiratory to 32 and pulse currently 92%  on room air and later 94% on 2 L of O2 by nasal cannula.  Labs revealed blood glucose of 162, creatinine 1.22 and BUN 27.  BNP was 570.9 and high-sensitivity troponin I was 29 twice.  CBC showed anemia likely chronic but worse than May 2022.  And also showed thrombocytopenia of 91 compared to 171 in May 2022.  D-dimer was 2 and PT 22.4. ?EKG as reviewed by me : EKG showed atrial fibrillation.  With controlled ventricular sponsor of 87 with PVCs, left anterior fascicular block and low voltage QRS ?Imaging: Two-view chest x-ray showed aortic atherosclerosis and emphysema with no acute cardiopulmonary disease. ? ?The patient was given 40 mg of IV Lasix, 125 mg of IV Solu-Medrol and nebulizer butyryl in the ER for concern about COPD and CHF exacerbation.  He he is directly admitted to a cardiac telemetry bed for further evaluation and management. ?PAST MEDICAL HISTORY:  ? ?Past Medical History:  ?Diagnosis Date  ? AAA (abdominal aortic aneurysm) without rupture ([place])   ? Asthma   ? CHF (congestive heart failure) ([place])   ? COPD (chronic obstructive pulmonary disease) ([place])   ? Coronary artery disease   ? Hepatitis A   ? Hypertension   ? PAF (paroxysmal atrial fibrillation) ([place])   ? Rheumatoid arthritis ([place])   ? ? ?PAST SURGICAL HISTORY:  ? ?Past Surgical History:  ?Procedure Laterality Date  ? BACK SURGERY    ? FRACTURE SURGERY    ? JOINT REPLACEMENT    ?  LAPAROTOMY N/A 07/25/2019  ? Procedure: EXPLORATORY LAPAROTOMY,REPAIR AND ANASTOMOSIS OF SMALL BOWEL, ENTEROLYSIS;  Surgeon: [name], MD;  Location: [facility];  Service: General;  Laterality: N/A;  ? ? ?SOCIAL HISTORY:  ? ?Social History  ? ?Tobacco Use  ? Smoking status: Every Day  ? Smokeless tobacco: Never  ?Substance Use Topics  ? Alcohol use: Yes  ?  Alcohol/week: 0.0 standard drinks  ?  Comment: 3 beers weekly  ? ? ?FAMILY HISTORY:  ? ?Family History  ?Family history unknown: Yes  ? ? ?DRUG ALLERGIES:  ? ?Allergies  ?Allergen Reactions  ? Propoxyphene Nausea  And Vomiting  ? ? ?REVIEW OF SYSTEMS:  ? ?ROS ?As per history of present illness. All pertinent systems were reviewed above. Constitutional, HEENT, cardiovascular, respiratory, GI, GU, musculoskeletal, neuro, psychiatric, endocrine, integumentary and hematologic systems were reviewed and are otherwise negative/unremarkable except for positive findings mentioned above in the HPI. ? ? ?MEDICATIONS AT HOME:  ? ?Prior to Admission medications   ?Medication Sig Start Date End Date Taking? Authorizing Provider  ?cyanocobalamin 1000 MCG tablet Take by mouth.    [provider]  ?diltiazem (CARDIZEM CD) 120 MG 24 hr capsule Take 1 capsule (120 mg total) by mouth daily. 08/08/19   Amin, Jeanella Flattery, MD  ?doxycycline (VIBRAMYCIN) 100 MG capsule Take 1 capsule (100 mg total) by mouth 2 (two) times daily. ?Patient not taking: Reported on 03/01/2021 07/05/20   Arnaldo Natal, MD  ?folic acid (FOLVITE) 1 MG tablet Take 1 mg by mouth daily. 04/29/19   [provider]  ?furosemide (LASIX) 20 MG tablet Take 1 tablet (20 mg total) by mouth as needed (TAKE AS NEEDED FOR WEIGHT GAIN 3-5 POUNDS- OR LEG SWELLING). 12/08/19 03/07/20  Elouise Munroe, MD  ?gabapentin (NEURONTIN) 300 MG capsule Take 300 mg by mouth 2 (two) times daily. Reported on 06/03/2015    [provider]  ?HYDROcodone-acetaminophen (NORCO/VICODIN) 5-325 MG tablet Take 1 tablet by mouth every 6 (six) hours as needed. ?Patient not taking: Reported on 03/15/2021 01/15/19   Virgel Manifold, MD  ?magnesium oxide (MAG-OX) 400 MG tablet Take 400 mg by mouth daily.     [provider]  ?predniSONE (DELTASONE) 10 MG tablet Take by mouth. 02/15/21   [provider]  ?sulfaSALAzine (AZULFIDINE) 500 MG tablet Take 1 tablet by mouth daily. 02/15/21   [provider]  ?VENTOLIN HFA 108 (90 Base) MCG/ACT inhaler Inhale 1-2 puffs into the lungs every 4 (four) hours as needed.  05/30/19   [provider]  ?warfarin (COUMADIN) 5  MG tablet Take 1-2 tablets daily or as prescribed by Clinic 03/15/21   Elouise Munroe, MD  ? ?  ? ?VITAL SIGNS:  ?Blood pressure 123/75, pulse 88, temperature 97.8 ?F (36.6 ?C), temperature source Oral, resp. rate 16, height 6\' 1"  (1.854 m), weight 110.6 kg, SpO2 92 %. ? ?PHYSICAL EXAMINATION:  ?Physical Exam ? ?GENERAL:  76 y.o.-year-old Caucasian male patient semi-lying in the bed with mild respiratory distress with conversational dyspnea. ?EYES: Pupils equal, round, reactive to light and accommodation. No scleral icterus. Extraocular muscles intact.  ?HEENT: Head atraumatic, normocephalic. Oropharynx and nasopharynx clear.  ?NECK:  Supple, no jugular venous distention. No thyroid enlargement, no tenderness.  ?LUNGS: Diminished bibasilar breath sounds with bibasilar rales with diffuse expiratory wheezes and diminished expiratory airflow with harsh physical breathing. ?CARDIOVASCULAR: Regular rate and rhythm, S1, S2 normal. No murmurs, rubs, or gallops.  ?ABDOMEN: Soft, nondistended, nontender. Bowel sounds present. No organomegaly  or mass.  ?EXTREMITIES: 2+ bilateral lower extremity pitting edema with no cyanosis, or clubbing.  ?NEUROLOGIC: Cranial nerves II through XII are intact. Muscle strength 5/5 in all extremities. Sensation intact. Gait not checked.  ?PSYCHIATRIC: The patient is alert and oriented x 3.  Normal affect and good eye contact. ?SKIN: No obvious rash, lesion, or ulcer.  ? ?LABORATORY PANEL:  ? ?CBC ?Recent Labs  ?Lab 05/25/21 ?1721  ?WBC 6.3  ?HGB 10.0*  ?HCT 32.0*  ?PLT 91*  ? ?------------------------------------------------------------------------------------------------------------------ ? ?Chemistries  ?Recent Labs  ?Lab 05/25/21 ?1721  ?NA 137  ?K 4.1  ?CL 98  ?CO2 32  ?GLUCOSE 162*  ?BUN 27*  ?CREATININE 1.22  ?CALCIUM 8.2*  ?AST 19  ?ALT 20  ?ALKPHOS 42  ?BILITOT 1.0   ? ?------------------------------------------------------------------------------------------------------------------ ? ?Cardiac Enzymes ?No results for input(s): TROPONINI in the last 168 hours. ?------------------------------------------------------------------------------------------------------------------ ? ?RADIOL

## 2021-05-25 NOTE — Plan of Care (Signed)
  Problem: Education: Goal: Ability to demonstrate management of disease process will improve Outcome: Progressing Goal: Ability to verbalize understanding of medication therapies will improve Outcome: Progressing   Problem: Cardiac: Goal: Ability to achieve and maintain adequate cardiopulmonary perfusion will improve Outcome: Progressing   

## 2021-05-25 NOTE — ED Notes (Signed)
Dr Melina Copa at bedside to review admission orders. ?

## 2021-05-25 NOTE — ED Notes (Signed)
Placed patient on 2L [place] due to desaturation to 88-89%. Patient has a HX of sleep apnea although does not use a Cpap machine. Patient tolerating well. ?

## 2021-05-25 NOTE — ED Triage Notes (Signed)
Pt arrives with c/o bilateral leg swelling. Per pt, his legs have been swelling for about 6 weeks. Pt c/o pain in legs when he stands and when trying to ambulate. Pt has been admitted to the hospital 2 times in the last 2 moths. Pt does wear O2 when needed. ?

## 2021-05-25 NOTE — Assessment & Plan Note (Addendum)
Patient had episodic hypotension. ?At the time of his discharge his blood pressure is stable. He will continue with metoprolol and diltiazem.  ?

## 2021-05-25 NOTE — Assessment & Plan Note (Addendum)
Stable, continue medical therapy with gabapenitn.  ?

## 2021-05-25 NOTE — Assessment & Plan Note (Deleted)
-   The patient was directly admitted to a cardiac telemetry bed. ?- Continue diuresis with IV Lasix. ?- We will follow serial troponins. ?- We will obtain a 2D echo for current assessment of his EF. ?

## 2021-05-25 NOTE — Progress Notes (Signed)
Plan of Care Note for accepted transfer ? ? ?Patient: Jake [name] MRN: [id number]   DOA: 05/25/2021 ? ?Facility requesting transfer: [facility] ?Requesting Provider: Eustaquio Maize PA ?Reason for transfer: Acute on chronic diastolic HF and COPD exacerbation ?Facility course:  ? 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 ? ?Of note, patient has undergone 2 recent hospitalizations at [facility] within the month of March.  Initially patient was hospitalized from 3/16 until 3/24 and was discharged home on supplemental oxygen.  Patient was then rehospitalized at the same hospital from 3/25 until 3/28 for rapid atrial fibrillation, acute COPD exacerbation and acute on chronic diastolic congestive heart failure.  Patient was discharged on 3/28. ? ?Upon evaluation at [facility] emergency department patient was once again felt to be suffering from a combination of acute on chronic diastolic congestive heart failure with concurrent COPD exacerbation.  Patient was administered 40 mg of intravenous Lasix as well as Solu-Medrol and nebulized bronchodilator therapy.  Within several hours of the first dose of Lasix patient is already put out 800 cc of urine. ? ?ER provider requesting hospitalization for continued management of COPD exacerbation and acute on chronic diastolic congestive heart failure. ? ?Plan of care: ?The patient is accepted for admission to Telemetry unit, at [facility]   ? ? ?Author: ?Vernelle Emerald, MD ?05/25/2021 ? ?Check [email or web address] for on-call coverage. ? ?Nursing staff, Please call [place] number on Amion as soon as patient's arrival, so appropriate admitting provider can evaluate the pt. ?

## 2021-05-25 NOTE — ED Provider Notes (Signed)
?[place] EMERGENCY DEPARTMENT ?Provider Note ? ? ?CSN: [id number] ?Arrival date & time: 05/25/21  1625 ? ?  ? ?History ? ?Chief Complaint  ?Patient presents with  ? Leg Swelling  ? ? ?Behr Cislo is a 76 y.o. male with PMHx HTN. COPD with cor pulmonale, CAD, PAF on Coumadin who presents to the ED today with complaint of gradual onset, constant, worsening, bilateral lower extremity swelling. Pt with history of 2 recent admissions to [facility] for same. Daughter at bedside reports compliance with medications at home however swelling continues to return. Home Health evaluated patient today and recommended he come back to the ED due to amount of swelling. Daughter reports this has only been an issue for the past 6 weeks. She states that yesterday his right leg was weeping - she has applied ace bandage to same however today his left leg began weeping. Pt states he does feel SOB however denies any worse than normal - wears O2 PRN at home. Denies chest pain.  ? ?The history is provided by the patient, a relative and medical records.  ? ?  ? ?Home Medications ?Prior to Admission medications   ?Medication Sig Start Date End Date Taking? Authorizing Provider  ?cyanocobalamin 1000 MCG tablet Take by mouth.    [provider]  ?diltiazem (CARDIZEM CD) 120 MG 24 hr capsule Take 1 capsule (120 mg total) by mouth daily. 08/08/19   Amin, Jeanella Flattery, MD  ?doxycycline (VIBRAMYCIN) 100 MG capsule Take 1 capsule (100 mg total) by mouth 2 (two) times daily. ?Patient not taking: Reported on 03/01/2021 07/05/20   Arnaldo Natal, MD  ?folic acid (FOLVITE) 1 MG tablet Take 1 mg by mouth daily. 04/29/19   [provider]  ?furosemide (LASIX) 20 MG tablet Take 1 tablet (20 mg total) by mouth as needed (TAKE AS NEEDED FOR WEIGHT GAIN 3-5 POUNDS- OR LEG SWELLING). 12/08/19 03/07/20  Elouise Munroe, MD  ?gabapentin (NEURONTIN) 300 MG capsule Take 300 mg by mouth 2 (two) times daily. Reported on 06/03/2015    [provider]  ?HYDROcodone-acetaminophen (NORCO/VICODIN) 5-325 MG tablet Take 1 tablet by mouth every 6 (six) hours as needed. ?Patient not taking: Reported on 03/15/2021 01/15/19   Virgel Manifold, MD  ?magnesium oxide (MAG-OX) 400 MG tablet Take 400 mg by mouth daily.     [provider]  ?predniSONE (DELTASONE) 10 MG tablet Take by mouth. 02/15/21   [provider]  ?sulfaSALAzine (AZULFIDINE) 500 MG tablet Take 1 tablet by mouth daily. 02/15/21   [provider]  ?VENTOLIN HFA 108 (90 Base) MCG/ACT inhaler Inhale 1-2 puffs into the lungs every 4 (four) hours as needed.  05/30/19   [provider]  ?warfarin (COUMADIN) 5 MG tablet Take 1-2 tablets daily or as prescribed by Clinic 03/15/21   Elouise Munroe, MD  ?   ? ?Allergies    ?Propoxyphene   ? ?Review of Systems   ?Review of Systems  ?Constitutional:  Negative for chills and fever.  ?Respiratory:  Positive for shortness of breath (chronic) and wheezing.   ?Cardiovascular:  Positive for leg swelling. Negative for chest pain.  ?All other systems reviewed and are negative. ? ?Physical Exam ?Updated Vital Signs ?BP 108/62   Pulse 82   Temp 97.9 ?F (36.6 ?C) (Oral)   Resp 17   Wt 111.6 kg   SpO2 95%   BMI 32.46 kg/m?  ?Physical Exam ?Vitals and nursing note reviewed.  ?Constitutional:   ?  Appearance: He is not ill-appearing or diaphoretic.  ?HENT:  ?   Head: Normocephalic and atraumatic.  ?Eyes:  ?   Conjunctiva/sclera: Conjunctivae normal.  ?Cardiovascular:  ?   Rate and Rhythm: Normal rate and regular rhythm.  ?Pulmonary:  ?   Effort: Pulmonary effort is normal.  ?   Breath sounds: Wheezing present. No rhonchi or rales.  ?Abdominal:  ?   Palpations: Abdomen is soft.  ?   Tenderness: There is no abdominal tenderness.  ?Musculoskeletal:  ?   Cervical back: Neck supple.  ?   Right lower leg: Edema present.  ?   Left lower leg: Edema present.  ?   Comments: 2+ pitting edema bilaterally  ?Skin: ?   General: Skin is warm  and dry.  ?Neurological:  ?   Mental Status: He is alert.  ? ? ?ED Results / Procedures / Treatments   ?Labs ?(all labs ordered are listed, but only abnormal results are displayed) ?Labs Reviewed  ?CBC WITH DIFFERENTIAL/PLATELET - Abnormal; Notable for the following components:  ?    Result Value  ? RBC 2.90 (*)   ? Hemoglobin 10.0 (*)   ? HCT 32.0 (*)   ? MCV 110.3 (*)   ? MCH 34.5 (*)   ? RDW 15.8 (*)   ? Platelets 91 (*)   ? Lymphs Abs 0.4 (*)   ? All other components within normal limits  ?COMPREHENSIVE METABOLIC PANEL - Abnormal; Notable for the following components:  ? Glucose, Bld 162 (*)   ? BUN 27 (*)   ? Calcium 8.2 (*)   ? Total Protein 5.8 (*)   ? Albumin 3.2 (*)   ? All other components within normal limits  ?BRAIN NATRIURETIC PEPTIDE - Abnormal; Notable for the following components:  ? B Natriuretic Peptide 570.9 (*)   ? All other components within normal limits  ?PROTIME-INR - Abnormal; Notable for the following components:  ? Prothrombin Time 22.4 (*)   ? INR 2.0 (*)   ? All other components within normal limits  ?TROPONIN I (HIGH SENSITIVITY) - Abnormal; Notable for the following components:  ? Troponin I (High Sensitivity) 29 (*)   ? All other components within normal limits  ?TROPONIN I (HIGH SENSITIVITY)  ? ? ?EKG ?EKG Interpretation ? ?Date/Time:  Wednesday May 25 2021 17:18:20 EDT ?Ventricular Rate:  87 ?PR Interval:    ?QRS Duration: 91 ?QT Interval:  363 ?QTC Calculation: 437 ?R Axis:   -88 ?Text Interpretation: Atrial fibrillation Ventricular premature complex Left anterior fascicular block Low voltage, precordial leads Consider right ventricular hypertrophy Nonspecific T abnormalities, lateral leads No significant change since prior 5/22 Confirmed by Aletta Edouard [id number]) on 05/25/2021 5:51:06 PM ? ?Radiology ?DG Chest 2 View ? ?Result Date: 05/25/2021 ?CLINICAL DATA:  Provided history: Rule out fluid overload. EXAM: CHEST - 2 VIEW COMPARISON:  Prior chest radiographs 07/14/2021 and  earlier. CT angiogram chest 07/07/2018 FINDINGS: Heart size at the upper limits of normal. Aortic atherosclerosis. No appreciable airspace consolidation or pulmonary edema. Known emphysema better appreciated on the prior chest CT of 05/06/2021. No sizable pleural effusion or evidence of pneumothorax. No acute bony abnormality identified. Degenerative changes of the spine. IMPRESSION: No evidence of acute cardiopulmonary abnormality. Aortic Atherosclerosis (ICD10-I70.0) and Emphysema (ICD10-J43.9). Electronically Signed   By: Kellie Simmering D.O.   On: 05/25/2021 18:00   ? ?Procedures ?Procedures  ? ? ?Medications Ordered in ED ?Medications  ?methylPREDNISolone sodium succinate (SOLU-MEDROL) 125 mg/2 mL injection 125 mg (125 mg  Intravenous Given 05/25/21 1726)  ?albuterol (PROVENTIL) (2.5 MG/3ML) 0.083% nebulizer solution 5 mg (5 mg Nebulization Given 05/25/21 1713)  ?furosemide (LASIX) injection 40 mg (40 mg Intravenous Given 05/25/21 1833)  ? ? ?ED Course/ Medical Decision Making/ A&P ?Clinical Course as of 05/25/21 2052  ?Wed May 25, 2821  ?2260 76 year old male here with increased shortness of breath increased leg swelling.  Sounds like he has had a few visits to regional for same.  Will need admission for diuresis and further cardiac work-up. [MB]  ?  ?Clinical Course User Index ?[MB] Hayden Rasmussen, MD  ? ?                        ?Medical Decision Making ?76 year old male who presents to the ED today with complaint of recurrent bilateral leg swelling.  Recently had 2 hospital stays at [facility] regional for same with improvement after IV Lasix.  Niece reports compliance with same at home.  On arrival to the ED today patient's vitals are stable.  He is afebrile, nontachycardic and nontachypneic.  Originally satting 92% on room air.  Patient does wear oxygen at home as needed.  Exam he has bilateral 2+ pitting edema to his lower extremities with weeping.  Ace wrap to right lower extremity.  Does have home health nurse  that comes out twice a week.  Is also noted to have significant wheezing on exam however speaking full sentences without difficulty.  History of COPD, continues to smoke.  Reports that patient was taken off of his alb

## 2021-05-25 NOTE — Assessment & Plan Note (Addendum)
Patient developed atrial fibrillation with rapid ventricular response. ?He was placed on IV diltiazem and then transitioned to oral AV blockade with increased dose of diltiazem and metoprolol.  ? ?AV blockade was interrupted transitory when patient developed sepsis. Now he has been resumed on AV blockade with good toleration.  ? ?Continue anticoagulation with warfarin, dosing during his hospitalization per pharmacy protocol. ?At the time of his discharge his INR is 1.9  ?

## 2021-05-25 NOTE — Progress Notes (Signed)
ANTICOAGULATION CONSULT NOTE - Initial Consult ? ?Pharmacy Consult for Warfarin  ?Indication: atrial fibrillation ? ?Allergies  ?Allergen Reactions  ? Propoxyphene Nausea And Vomiting  ? ? ?Patient Measurements: ?Height: 6\' 1"  (185.4 cm) ?Weight: 110.6 kg (243 lb 13.3 oz) ?IBW/kg (Calculated) : 79.9 ?Vital Signs: ?Temp: 97.8 ?F (36.6 ?C) (04/05 2229) ?Temp Source: Oral (04/05 2229) ?BP: 123/75 (04/05 2229) ?Pulse Rate: 88 (04/05 2229) ? ?Labs: ?Recent Labs  ?  05/25/21 ?1721 05/25/21 ?1910  ?HGB 10.0*  --   ?HCT 32.0*  --   ?PLT 91*  --   ?LABPROT 22.4*  --   ?INR 2.0*  --   ?CREATININE 1.22  --   ?TROPONINIHS 29* 29*  ? ? ?Estimated Creatinine Clearance: 66.1 mL/min (by C-G formula based on SCr of 1.22 mg/dL). ? ? ?Medical History: ?Past Medical History:  ?Diagnosis Date  ? AAA (abdominal aortic aneurysm) without rupture ([place])   ? Asthma   ? CHF (congestive heart failure) ([place])   ? COPD (chronic obstructive pulmonary disease) ([place])   ? Coronary artery disease   ? Hepatitis A   ? Hypertension   ? PAF (paroxysmal atrial fibrillation) ([place])   ? Rheumatoid arthritis ([place])   ? ? ? ?Assessment: ?76 y/o M with heart failure exacerbation. On warfarin PTA for afib. INR is therapeutic at 2. Hgb 10. Plts 91.  ? ?Recently discharged from [facility] on warfarin 7.5 mg Mon/Wed/Fri/Sun and 5 mg all other days ? ?Goal of Therapy:  ?INR 2-3 ?Monitor platelets by anticoagulation protocol: Yes ?  ?Plan:  ?Warfarin 7.5 mg PO x 1 tonight ?Daily PT/INR ?Monitor for bleeding ? ?Narda Bonds, PharmD, BCPS ?Clinical Pharmacist ?Phone: [phone] ? ? ?

## 2021-05-26 ENCOUNTER — Other Ambulatory Visit (HOSPITAL_COMMUNITY): Payer: Self-pay

## 2021-05-26 ENCOUNTER — Encounter (HOSPITAL_COMMUNITY): Payer: Self-pay | Admitting: Internal Medicine

## 2021-05-26 ENCOUNTER — Inpatient Hospital Stay (HOSPITAL_COMMUNITY): Payer: Medicare Other

## 2021-05-26 DIAGNOSIS — J441 Chronic obstructive pulmonary disease with (acute) exacerbation: Secondary | ICD-10-CM | POA: Diagnosis not present

## 2021-05-26 DIAGNOSIS — J9621 Acute and chronic respiratory failure with hypoxia: Secondary | ICD-10-CM | POA: Diagnosis not present

## 2021-05-26 DIAGNOSIS — I482 Chronic atrial fibrillation, unspecified: Secondary | ICD-10-CM | POA: Diagnosis not present

## 2021-05-26 DIAGNOSIS — I5031 Acute diastolic (congestive) heart failure: Secondary | ICD-10-CM | POA: Diagnosis not present

## 2021-05-26 DIAGNOSIS — I5033 Acute on chronic diastolic (congestive) heart failure: Secondary | ICD-10-CM | POA: Diagnosis present

## 2021-05-26 DIAGNOSIS — G629 Polyneuropathy, unspecified: Secondary | ICD-10-CM | POA: Diagnosis not present

## 2021-05-26 DIAGNOSIS — M069 Rheumatoid arthritis, unspecified: Secondary | ICD-10-CM | POA: Diagnosis present

## 2021-05-26 LAB — PROTIME-INR
INR: 2.4 — ABNORMAL HIGH (ref 0.8–1.2)
Prothrombin Time: 25.8 seconds — ABNORMAL HIGH (ref 11.4–15.2)

## 2021-05-26 LAB — ECHOCARDIOGRAM COMPLETE
Height: 73 in
S' Lateral: 3.1 cm
Weight: 3901.26 oz

## 2021-05-26 LAB — CBC
HCT: 30.5 % — ABNORMAL LOW (ref 39.0–52.0)
Hemoglobin: 9.5 g/dL — ABNORMAL LOW (ref 13.0–17.0)
MCH: 34.4 pg — ABNORMAL HIGH (ref 26.0–34.0)
MCHC: 31.1 g/dL (ref 30.0–36.0)
MCV: 110.5 fL — ABNORMAL HIGH (ref 80.0–100.0)
Platelets: 94 10*3/uL — ABNORMAL LOW (ref 150–400)
RBC: 2.76 MIL/uL — ABNORMAL LOW (ref 4.22–5.81)
RDW: 15.7 % — ABNORMAL HIGH (ref 11.5–15.5)
WBC: 6.8 10*3/uL (ref 4.0–10.5)
nRBC: 0 % (ref 0.0–0.2)

## 2021-05-26 LAB — BASIC METABOLIC PANEL
Anion gap: 10 (ref 5–15)
BUN: 23 mg/dL (ref 8–23)
CO2: 29 mmol/L (ref 22–32)
Calcium: 8.2 mg/dL — ABNORMAL LOW (ref 8.9–10.3)
Chloride: 97 mmol/L — ABNORMAL LOW (ref 98–111)
Creatinine, Ser: 1.15 mg/dL (ref 0.61–1.24)
GFR, Estimated: >60 mL/min (ref >60)
Glucose, Bld: 199 mg/dL — ABNORMAL HIGH (ref 70–99)
Potassium: 4 mmol/L (ref 3.5–5.1)
Sodium: 136 mmol/L (ref 135–145)

## 2021-05-26 LAB — MRSA NEXT GEN BY PCR, NASAL: MRSA by PCR Next Gen: NOT DETECTED

## 2021-05-26 LAB — TSH: TSH: 0.882 u[IU]/mL (ref 0.350–4.500)

## 2021-05-26 MED ORDER — PERFLUTREN LIPID MICROSPHERE
1.0000 mL | INTRAVENOUS | Status: AC | PRN
  Administered 2021-05-26: 3 mL via INTRAVENOUS
  Filled 2021-05-26: qty 10

## 2021-05-26 MED ORDER — WARFARIN SODIUM 5 MG PO TABS
5.0000 mg | ORAL_TABLET | Freq: Once | ORAL | Status: AC
Start: 2021-05-26 — End: 2021-05-26
  Administered 2021-05-26: 5 mg via ORAL
  Filled 2021-05-26: qty 1

## 2021-05-26 NOTE — Assessment & Plan Note (Deleted)
-   The patient was directly admitted to a cardiac telemetry bed. ?- Continue diuresis with IV Lasix. ?- We will follow serial troponins. ?- We will obtain a 2D echo for current assessment of his EF. ?- Cardiology consult will be obtained. ?- I notified Gay Filler about patient. ?

## 2021-05-26 NOTE — Progress Notes (Signed)
Pt declined HV TOC clinic appt upon DC from hospitalization. Pt educated on benefits of HV TOC. Pt education complete regarding HF patient booklet.  Patient stated he doesn't want to add another appointment and will follow up with his cardiologist. Patient verbalized his understanding of daily weights, fluid and diet restrictions, when to call his doctor and taking all his medications as prescribed.  ? ? ?Earnestine Leys, BSN, RN ?Heart Failure Nurse Navigator ?[phone]  ?

## 2021-05-26 NOTE — Assessment & Plan Note (Addendum)
Acute exacerbation.  ?Patient was placed on short course of systemic corticosteroids.  ?Treated with  inhaled steroids and bronchodilatory therapy.  ?Out of bed to chair and continue Pt and OT ? ?At the time of his discharge his systemic steroids have been discontinued.  ? ?

## 2021-05-26 NOTE — Assessment & Plan Note (Addendum)
No active flare.  ?Follow up as outpatient.  ?

## 2021-05-26 NOTE — Progress Notes (Signed)
Heart Failure Stewardship Pharmacist Progress Note ? ? ?PCP: Glendon Axe, MD ?PCP-Cardiologist: Elouise Munroe, MD  ? ? ?HPI:  ?76 yo M with PMH of CHF, cor pulmonale, CAD, HTN, afib, AAA, NSCLC s/p radiation, RA, CKD, tobacco use, and asthma/COPD. He presented to the ED on 4/5 with worsening LE edema, weight gain, dyspnea, and orthopnea. CXR with no evidence of acute cardiopulmonary disease. An ECHO was done on 4/6 and LVEF is 60-65% with moderate LVH and low normal RV function.  ? ?Current HF Medications: ?Diuretic: furosemide 40 mg IV BID ? ?Prior to admission HF Medications: ?Diuretic: furosemide 20 mg PRN ? ?Pertinent Lab Values: ?Serum creatinine 1.15, BUN 23, Potassium 4.0, Sodium 136, BNP 570.9  ? ?Vital Signs: ?Weight: 243 lbs (admission weight: 243 lbs) ?Blood pressure: 100/60s  ?Heart rate: 90-100s  ?I/O: -1.3L yesterday; net -2.9L ? ?Medication Assistance / Insurance Benefits Check: ?Does the patient have prescription insurance?  Yes ?Type of insurance plan: [employer] ? ?Outpatient Pharmacy:  ?[facility] ?Is the patient willing to use [facility] at discharge? Yes ?Is the patient willing to transition their outpatient pharmacy to utilize a [facility] outpatient pharmacy?   Pending ?  ? ?Assessment: ?1. Acute on chronic diastolic CHF (EF 38-17%) due to multifactorial NICM (COPD, OSA, tobacco use, cor pulmonale). NYHA class III symptoms. ?- Continue furosemide 40 mg IV BID - low threshold to increase  ?- BP too soft for ACE/ARB/ARNI or spironolactone ?- Consider adding SGLT2i prior to discharge ?- Agree to add digoxin if tachycardia continues  ?  ?Plan: ?1) Medication changes recommended at this time: ?- Continue IV diuresis  ? ?2) Patient assistance: ?- $374.23 remaining on deductible ? ?3)  Education  ?- To be completed prior to discharge ? ?Kerby Nora, PharmD, BCPS ?Heart Failure Stewardship Pharmacist ?Phone [phone] ? ? ?

## 2021-05-26 NOTE — Progress Notes (Signed)
?  Progress Note ? ? ?Patient: Jake [name] HYW:[id number] DOB: 1945/10/14 DOA: 05/25/2021     1 ?DOS: the patient was seen and examined on 05/26/2021 ?  ?Brief hospital course: ?Jake [name] was admitted to the hospital with the working diagnosis of decompensated heart failure.  ? ?76 yo male with the past medical history of heart failure, COPD, hypertension, atrial fibrillation and asthma who presented with dyspnea and lower extremity edema. Reported worsening lower extremity edema for the last 6 weeks, he has gain 20 over last 2 weeks. Symptoms associated with dyspnea and 2 pillow orthopnea. On the day of admission home health nurse advice to come to the ED due worsening symptoms. On his initial physical examination his blood pressure is 101/57, HR 88, RR 16 and 02 saturation 92%, lungs with decreased breath sounds bilaterally, positive rales and wheezing, increased work of breathing, heart with S1 and S2 present and rhythmic, abdomen not distended and positive lower extremity edema ++.  ? ?Na 137, K 4,1 Cl 98, bicarbonate 32, glucose 162, bun 27, cr 1,2 ?BNP 570 ?Troponin 29-29  ?Wbc 6.3 hgb 10,0 hct 32, plt 91  ?INR 2,0  ? ?Chest radiograph with no cardiomegaly, bilateral interstitial infiltrates, small right pleural effusion.  ? ?EKG with 87 bpm, left axis deviation, normal intervals, atrial fibrillation with PVC, q wave V1 and V2 with no significant ST segment or T wave changes.  ? ?Assessment and Plan: ?* Acute on chronic diastolic CHF (congestive heart failure) ([place]) ?Echocardiogram with LV systolic function preserved EF 60 to 48%, RV systolic function preserved. Right atrial severe dilatation. (poor acoustic windows)  ? ?Patient continue to have significant lower extremity edema.  ?Urine output over last 24 hrs is 1,800 cc ?Blood pressure systolic is 54-627 mmHg. ? ?Plan to continue diuresis with furosemide IV ?Continue blood pressure monitoring.  ? ? ?COPD with acute exacerbation ([place]) ?Continue bronchodilator  therapy  ?He has mild expiratory wheezing. ? ?Dyspnea likely more related to acute pulmonary edema than COPD exacerbation. ?Continue oxymetry monitoring and supplemental 02 per [place]  to keep 02 saturation 92% or greater.  ? ?Discontinue systemic corticosteroids.  ? ?A-fib ([place]) ?- We will continue his Cardizem CD and Coumadin. ? ?Essential hypertension ?- We will continue his Cardizem CD and HCTZ. ? ?Rheumatoid arthritis ([place]) ?- We will continue sulfasalazine. ? ?Peripheral polyneuropathy ?- We will continue Neurontin ? ? ? ? ?  ? ?Subjective: patient continue to have lower extremity edema, dyspnea has improved but not back to baseline  ? ?Physical Exam: ?Vitals:  ? 05/26/21 0900 05/26/21 1000 05/26/21 1100 05/26/21 1657  ?BP: (!) 126/59 133/65 103/69 109/89  ?Pulse: (!) 114 (!) 106 (!) 101 (!) 107  ?Resp: (!) 26 20 18 18   ?Temp:   97.8 ?F (36.6 ?C) 98.2 ?F (36.8 ?C)  ?TempSrc:   Oral Oral  ?SpO2: 91% 92% 93% 91%  ?Weight:      ?Height:      ? ?Neurology awake and alert ?ENT with no pallor ?Cardiovascular with S1 and S2 present irregularly irregular with no gallops rubs or murmurs ?No JVD ?Positive lower extremity edema +++ ?Respiratory with mild expiratory wheezing scattered and rales, with no rhonchi ?Abdomen protuberant but not distended.  ?Data Reviewed: ? ? ? ?Family Communication: no family at the bedside  ? ?Disposition: ?Status is: Inpatient ?Remains inpatient appropriate because: heart failure  ? Planned Discharge Destination: Home ? ?Author: ?Tawni Millers, MD ?05/26/2021 5:04 PM ? ?For on call review [email or web address].  ?

## 2021-05-26 NOTE — Consult Note (Addendum)
?Cardiology Consultation:  ? ?Patient ID: Jake [name] ?MRN: [id number]; DOB: 16-Jan-1946 ? ?Admit date: 05/25/2021 ?Date of Consult: 05/26/2021 ? ?PCP:  Jake Axe, MD ?  ?[facility] Providers ?Cardiologist:  Jake Munroe, MD      ? ? ?Patient Profile:  ? ?Jake [name] is a 76 y.o. 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 Sidney [name]. ? ?History of Present Illness:  ? ?Jake [name] was first seen by cardiology in 07/2019 for afib in the setting of SBO requiring surgical intervention. (Atrial fib had been noted during a prior admission as well in 2020 but he had not been able to afford DOAC so was not taking his meds.) Rate control strategy was employed and afib has been [name] permanent. Due to cost of DOAC, was anticoagulated with Coumadin. Echo at the time 07/2019 showed EF 55-60%, normal RV, mildly elevated PASP. He denies any hx of CAD. Last OV was in 05/2020. He was dx with lung CA in 2022, listed as cT1c cN0 cM0, treated with radiation. More recently he had 3 additional admissions at [facility]: ?- 03/2021 with norovirus ?- 3/16-3/20/2023 with acute hypoxic respiratory failure [name] 2/2 to COPD and diastolic CHF exacerbation, also with mildly elevated troponin [name] due to demand ischemia, AF RVR as well occasional pauses - per cardiology consult note, not [name] to be a problem. Echo showed EF 50-55%, mild LAE, mild MR, mild-moderate TR, moderate pulm HTN, RV not well visualized. CTA had shown no PE, + atx vs infiltrate RLL, trace R pleural  effusion, emphysema, fibrotic change RUL, aortic/coronary calcifications, and nodularity of liver possibly d/t underlying cirrhosis ?-3/25-3/28/23 with worsening SOB/cough, not using O2 at night, ran out of Lasix 2 days prior, again with COPD exacerbation, CHF and RVR. Echo repeated 05/16/21 showing EF 60-65%, mild LVH, severe RV enlargement with RV hypokinesis, mild MR, mild-moderate TR, moderate pulm HTN, severely dilated IVC, findings c/w cor pulmonale.  ? ?He presented to [place] yesterday with persistent leg swelling, abdominal distension, and SOB and was transferred to [facility] for furthere evaluation. He states the leg swelling has been present for several weeks, though states he [name] reasonably good after discharge from the hospital. He has not been consistently using his O2 around the house, stating "only when I need it," and has not been using it at night "because I sleep pretty good." He reports med compliance though reports he may have missed a few doses here and there. He smokes 1/2ppd and has not drank in several months (previously only occasional use I.e. 2-3 beers every week or every other week). He denies any chest pain, hemoptysis, syncope, or overt palpitations. Initial labs notable for BNP 570, hsTroponin low/flat at 29-29, Cr 1.22, albumin 3.2, K wnl, Hgb 10, plt 91, INR 2.0, TSH wnl. CXR NAD. He is being treated for AECOPD and CHF exacerbation. Desatted to 88-89% RA in ED. Received 40mg  IV Lasix BID starting yesterday evening and reports good UOP thus far (had 800cc alone after first dose of Lasix per ED note, may not  have been fully tracked in ED). The patient states he's about 50/50 when it comes to watching sodium intake. Denies any acute complaints presently. ? ?Weight @ last DC 247, measured 243.8 here. Internal med team has ordered a repeat echo, result pending. ? ?Past Medical History:  ?Diagnosis Date  ? Anemia   ? Asthma   ? Chronic diastolic (congestive) heart failure  (HCC)   ? Chronic respiratory failure ([place])   ? CKD (chronic kidney disease) stage 2, GFR 60-89 ml/min   ? COPD (chronic obstructive pulmonary disease) ([place])   ? Cor pulmonale (HCC)   ? Coronary artery calcification   ? Coronary artery disease   ? Hepatitis A   ? Hypertension   ? Lung cancer ([place])   ? OSA (obstructive sleep apnea)   ? Permanent atrial fibrillation ([place])   ? Pulmonary hypertension ([place])   ? Rheumatoid arthritis ([place])   ? Thrombocytopenia ([place])   ? ? ?Past Surgical History:  ?Procedure Laterality Date  ? BACK SURGERY    ? FRACTURE SURGERY    ? JOINT REPLACEMENT    ? LAPAROTOMY N/A 07/25/2019  ? Procedure: EXPLORATORY LAPAROTOMY,REPAIR AND ANASTOMOSIS OF SMALL BOWEL, ENTEROLYSIS;  Surgeon: Jake Hausen, MD;  Location: [facility] ORS;  Service: General;  Laterality: N/A;  ?  ? ?Home Medications:  ?Prior to Admission medications   ?Medication Sig Start Date End Date Taking? Authorizing Provider  ?cyanocobalamin 1000 MCG tablet Take by mouth.    [provider]  ?diltiazem (CARDIZEM CD) 120 MG 24 hr capsule Take 1 capsule (120 mg total) by mouth daily. 08/08/19   Amin, Jeanella Flattery, MD  ?folic acid (FOLVITE) 1 MG tablet Take 1 mg by mouth daily. 04/29/19   [provider]  ?furosemide (LASIX) 20 MG tablet Take 1 tablet (20 mg total) by mouth as needed (TAKE AS NEEDED FOR WEIGHT GAIN 3-5 POUNDS- OR LEG SWELLING). 12/08/19 03/07/20  Jake Munroe, MD  ?gabapentin (NEURONTIN) 300 MG capsule Take 300 mg by mouth 2 (two) times daily. Reported on 06/03/2015    [provider]  ?HYDROcodone-acetaminophen (NORCO/VICODIN) 5-325 MG tablet Take 1 tablet by mouth every 6 (six) hours as needed. ?Patient not taking: Reported on 03/15/2021 01/15/19   Virgel Manifold, MD  ?magnesium oxide (MAG-OX) 400 MG tablet Take 400 mg by mouth daily.     [provider]  ?predniSONE (DELTASONE) 10 MG tablet Take by mouth. 02/15/21   [provider]  ?sulfaSALAzine (AZULFIDINE) 500 MG tablet Take 1  tablet by mouth daily. 02/15/21   [provider]  ?VENTOLIN HFA 108 (90 Base) MCG/ACT inhaler Inhale 1-2 puffs into the lungs every 4 (four) hours as needed.  05/30/19   [provider]  ?warfarin (COUMADIN) 5 MG tablet Take 1-2 tablets daily or as prescribed by Clinic 03/15/21   Jake Munroe, MD  ? ? ?Inpatient Medications: ?Scheduled Meds: ? diltiazem  120 mg Oral Daily  ? folic acid  1 mg Oral Daily  ? furosemide  40 mg Intravenous BID  ? gabapentin  300 mg Oral BID  ? magnesium oxide  400 mg Oral Daily  ? predniSONE  10 mg Oral Q breakfast  ? sulfaSALAzine  500 mg Oral Daily  ? cyanocobalamin  1,000 mcg Oral Daily  ? warfarin  5 mg Oral ONCE-1600  ? Warfarin - Pharmacist Dosing Inpatient   Does not apply P2951  ? ?Continuous Infusions: ? ?PRN Meds: ?acetaminophen **OR** acetaminophen, albuterol, magnesium hydroxide, ondansetron **OR** ondansetron (ZOFRAN)  IV, perflutren lipid microspheres (DEFINITY) IV suspension, traZODone ? ?Allergies:    ?Allergies  ?Allergen Reactions  ? Propoxyphene Nausea And Vomiting  ? ? ?Social History:   ?Social History  ? ?Socioeconomic History  ? Marital status: Married  ?  Spouse name: Not on file  ? Number of children: Not on file  ? Years of education: Not on file  ? Highest education level: Not on file  ?Occupational History  ? Not on file  ?Tobacco Use  ? Smoking status: Every Day  ? Smokeless tobacco: Never  ?Vaping Use  ? Vaping Use: Never used  ?Substance and Sexual Activity  ? Alcohol use: Yes  ?  Alcohol/week: 0.0 standard drinks  ?  Comment: 3 beers weekly  ? Drug use: No  ? Sexual activity: Not on file  ?Other Topics Concern  ? Not on file  ?Social History Narrative  ? Not on file  ? ?Social Determinants of Health  ? ?Financial Resource Strain: Not on file  ?Food Insecurity: Not on file  ?Transportation Needs: Not on file  ?Physical Activity: Not on file  ?Stress: Not on file  ?Social Connections: Not on file  ?Intimate Partner Violence: Not on file   ?  ?Family History:   ?Family History  ?Family history unknown: Yes  ?  ? ?ROS:  ?Please see the history of present illness.  ?All other ROS reviewed and negative.    ? ?Physical Exam/Data:  ? ?Vitals:  ?

## 2021-05-26 NOTE — Progress Notes (Signed)
ANTICOAGULATION CONSULT NOTE - Initial Consult ? ?Pharmacy Consult for Warfarin  ?Indication: atrial fibrillation ? ?Allergies  ?Allergen Reactions  ? Propoxyphene Nausea And Vomiting  ? ? ?Patient Measurements: ?Height: 6\' 1"  (185.4 cm) ?Weight: 110.6 kg (243 lb 13.3 oz) ?IBW/kg (Calculated) : 79.9 ?Vital Signs: ?Temp: 98.1 ?F (36.7 ?C) (04/06 0800) ?Temp Source: Oral (04/06 0800) ?BP: 97/54 (04/06 0800) ?Pulse Rate: 96 (04/06 0800) ? ?Labs: ?Recent Labs  ?  05/25/21 ?1721 05/25/21 ?1910 05/26/21 ?0110 05/26/21 ?1610  ?HGB 10.0*  --  9.5*  --   ?HCT 32.0*  --  30.5*  --   ?PLT 91*  --  94*  --   ?LABPROT 22.4*  --   --  25.8*  ?INR 2.0*  --   --  2.4*  ?CREATININE 1.22  --  1.15  --   ?TROPONINIHS 29* 29*  --   --   ? ? ? ?Estimated Creatinine Clearance: 70.2 mL/min (by C-G formula based on SCr of 1.15 mg/dL). ? ? ?Medical History: ?Past Medical History:  ?Diagnosis Date  ? AAA (abdominal aortic aneurysm) without rupture ([place])   ? Asthma   ? CHF (congestive heart failure) ([place])   ? COPD (chronic obstructive pulmonary disease) ([place])   ? Coronary artery disease   ? Hepatitis A   ? Hypertension   ? PAF (paroxysmal atrial fibrillation) ([place])   ? Rheumatoid arthritis ([place])   ? ? ? ?Assessment: ?76 y/o M with heart failure exacerbation. On warfarin PTA for afib. INR today is in range at 2.4.  No overt bleeding or complications noted. ? ?Recently discharged from [facility] on warfarin 7.5 mg Mon/Wed/Fri/Sun and 5 mg all other days ? ?Goal of Therapy:  ?INR 2-3 ?Monitor platelets by anticoagulation protocol: Yes ?  ?Plan:  ?Warfarin 5 mg PO x 1 tonight (c/w home dose) ?Daily PT/INR ?Monitor for bleeding ? ?[place] Crane, Pharm D, BCPS, BCCP ?Clinical Pharmacist ? 05/26/2021 8:59 AM  ? ?[facility] pharmacy phone numbers are listed on [email or web address] ? ? ? ?

## 2021-05-26 NOTE — Assessment & Plan Note (Addendum)
Patient was admitted to the progressive care unit and was placed on furosemide for diuresis.  ?Negative fluid balance was achieved, -25,118 ml since admission with significant improvement in his symptoms.   ? ?Echocardiogram with LV systolic function preserved EF 60 to 02%, RV systolic function preserved. Right atrial severe dilatation. (poor acoustic windows)  ? ?04/07 chest radiograph with bilateral interstitial infiltrates with hilar vascular congestion, positive hyperinflation. (personally reviewed).  ? ?04/09 chest film with improved pulmonary edema but new right lower lobe infiltrate.  ? ?04/14 Chest radiograph with right lower lobe infiltrate and pulmonary edema.  ? ?At the time of his discharge patient is euvolemic, and back to his baseline 02 supplementation of 4 L per min per regular nasal cannula.  ? ?Continue heart failure management with metoprolol, empagliflozin and spironolactone.  ?Resume furosemide 40 mg daily and have close follow up a outpatient.  ? ? ? ?

## 2021-05-26 NOTE — TOC Progression Note (Signed)
Transition of Care (TOC) - Progression Note  ? ? ?Patient Details  ?Name: Jake [name] ?MRN: [id number] ?Date of Birth: [birth year] ? ?Transition of Care (TOC) CM/SW Contact  ?Angelita Ingles, RN ?Phone [phone] ? ?05/26/2021, 3:48 PM ? ?Clinical Narrative:    ? ?Transition of Care (TOC) Screening Note ? ? ?Patient Details  ?Name: Jake [name] ?Date of Birth: November 03, 1945 ? ? ?Transition of Care (TOC) CM/SW Contact:    ?Angelita Ingles, RN ?Phone Number: ?05/26/2021, 3:48 PM ? ? ? ?Transition of Care Department [facility]) has reviewed patient and no TOC needs have been identified at this time. We will continue to monitor patient advancement through interdisciplinary progression rounds. If new patient transition needs arise, please place a TOC consult. ? ? ? ? ?  ?  ? ?Expected Discharge Plan and Services ?  ?  ?  ?  ?  ?                ?  ?  ?  ?  ?  ?  ?  ?  ?  ?  ? ? ?Social Determinants of Health (SDOH) Interventions ?Food Insecurity Interventions: Intervention Not Indicated ?Financial Strain Interventions: Intervention Not Indicated ?Housing Interventions: Intervention Not Indicated ?Transportation Interventions: Intervention Not Indicated ? ?Readmission Risk Interventions ?   ? View : No data to display.  ?  ?  ?  ? ? ?

## 2021-05-26 NOTE — Progress Notes (Signed)
?  Echocardiogram ?2D Echocardiogram has been performed. ? ?Jake [name] ?05/26/2021, 9:18 AM ?

## 2021-05-26 NOTE — Hospital Course (Addendum)
Jake [name] was admitted to the hospital with the working diagnosis of decompensated diastolic heart failure.  ?Hospitalization complicated with right lower lobe aspiration pneumonia and sepsis.  ? ?76 yo male with the past medical history of heart failure, COPD, hypertension, atrial fibrillation and asthma who presented with dyspnea and lower extremity edema. Reported worsening lower extremity edema for the last 6 weeks, he gained 20 over last 2 weeks. Symptoms associated with dyspnea and 2 pillow orthopnea. On the day of admission home health nurse advice to come to the ED due worsening symptoms. On his initial physical examination his blood pressure is 101/57, HR 88, RR 16 and 02 saturation 92%, lungs with decreased breath sounds bilaterally, positive rales and wheezing, increased work of breathing, heart with S1 and S2 present and rhythmic, abdomen not distended and positive lower extremity edema ++.  ? ?Na 137, K 4,1 Cl 98, bicarbonate 32, glucose 162, bun 27, cr 1,2 ?BNP 570 ?Troponin 29-29  ?Wbc 6.3 hgb 10,0 hct 32, plt 91  ?INR 2,0  ? ?Chest radiograph with no cardiomegaly, bilateral interstitial infiltrates, small right pleural effusion.  ? ?EKG with 87 bpm, left axis deviation, normal intervals, atrial fibrillation with PVC, q wave V1 and V2 with no significant ST segment or T wave changes.  ? ?Patient was placed on IV furosemide for diuresis with good toleration. ?He had rapid ventricular response due to atrial fibrillation.   ?AV blockade was adjusted with diltiazem and metoprolol.  ? ?04/09 patient had worsening respiratory failure, with increase oxygen requirements. ?Chest film with new right lower lobe infiltrate, suspected aspiration pneumonia.  ?Started on antibiotic therapy.  ? ?04/11 improving oxygenation, with decreased 02 requirements from 30 L/min with 100% fi02 per heated high flow nasal.  ? ?04/14 chest film with right lower lobe infiltrate and left base atelectasis.  ?Positive congestion, with  bilateral hilar vascular congestion.  ?Continue with high oxygen requirements.  ? ?Patient continue diuresing with improvement in oxygenation, completed antibiotic therapy ?Wean off from heated high flow nasal cannula to a regular high flow nasal cannula with good toleration.  ? ?04/20 continue to improve oxygen requirements.  ?He has been out of bed to chair.  ?Plan to discharge home with home health services.  ? ?04/21 plan to transition to regular nasal cannula. ?04/22 plan to discharge home with follow up as outpatient.  ?

## 2021-05-27 DIAGNOSIS — I5033 Acute on chronic diastolic (congestive) heart failure: Secondary | ICD-10-CM | POA: Diagnosis not present

## 2021-05-27 DIAGNOSIS — E669 Obesity, unspecified: Secondary | ICD-10-CM

## 2021-05-27 DIAGNOSIS — M069 Rheumatoid arthritis, unspecified: Secondary | ICD-10-CM | POA: Diagnosis not present

## 2021-05-27 DIAGNOSIS — J441 Chronic obstructive pulmonary disease with (acute) exacerbation: Secondary | ICD-10-CM | POA: Diagnosis not present

## 2021-05-27 DIAGNOSIS — J9621 Acute and chronic respiratory failure with hypoxia: Secondary | ICD-10-CM | POA: Diagnosis not present

## 2021-05-27 DIAGNOSIS — I482 Chronic atrial fibrillation, unspecified: Secondary | ICD-10-CM | POA: Diagnosis not present

## 2021-05-27 DIAGNOSIS — E66811 Obesity, class 1: Secondary | ICD-10-CM | POA: Diagnosis present

## 2021-05-27 DIAGNOSIS — D649 Anemia, unspecified: Secondary | ICD-10-CM | POA: Diagnosis present

## 2021-05-27 LAB — BASIC METABOLIC PANEL
Anion gap: 8 (ref 5–15)
BUN: 28 mg/dL — ABNORMAL HIGH (ref 8–23)
CO2: 35 mmol/L — ABNORMAL HIGH (ref 22–32)
Calcium: 8.6 mg/dL — ABNORMAL LOW (ref 8.9–10.3)
Chloride: 96 mmol/L — ABNORMAL LOW (ref 98–111)
Creatinine, Ser: 1.16 mg/dL (ref 0.61–1.24)
GFR, Estimated: >60 mL/min (ref >60)
Glucose, Bld: 115 mg/dL — ABNORMAL HIGH (ref 70–99)
Potassium: 4.2 mmol/L (ref 3.5–5.1)
Sodium: 139 mmol/L (ref 135–145)

## 2021-05-27 LAB — PROTIME-INR
INR: 2.4 — ABNORMAL HIGH (ref 0.8–1.2)
Prothrombin Time: 26.1 seconds — ABNORMAL HIGH (ref 11.4–15.2)

## 2021-05-27 MED ORDER — WARFARIN SODIUM 7.5 MG PO TABS
7.5000 mg | ORAL_TABLET | Freq: Once | ORAL | Status: AC
  Administered 2021-05-27: 7.5 mg via ORAL
  Filled 2021-05-27: qty 1

## 2021-05-27 MED ORDER — FUROSEMIDE 10 MG/ML IJ SOLN
80.0000 mg | Freq: Two times a day (BID) | INTRAMUSCULAR | Status: DC
  Filled 2021-05-27: qty 8

## 2021-05-27 MED ORDER — DIGOXIN 125 MCG PO TABS
0.2500 mg | ORAL_TABLET | Freq: Every day | ORAL | Status: DC
  Administered 2021-05-27: 0.25 mg via ORAL
  Filled 2021-05-27: qty 2

## 2021-05-27 MED ORDER — SPIRONOLACTONE 12.5 MG HALF TABLET
12.5000 mg | ORAL_TABLET | Freq: Every day | ORAL | Status: DC
  Administered 2021-05-27 – 2021-05-29 (×3): 12.5 mg via ORAL
  Filled 2021-05-27 (×3): qty 1

## 2021-05-27 MED ORDER — FUROSEMIDE 10 MG/ML IJ SOLN
80.0000 mg | Freq: Two times a day (BID) | INTRAMUSCULAR | Status: DC
  Administered 2021-05-27 – 2021-05-28 (×2): 80 mg via INTRAVENOUS
  Filled 2021-05-27 (×2): qty 8

## 2021-05-27 MED ORDER — METOPROLOL TARTRATE 25 MG PO TABS
25.0000 mg | ORAL_TABLET | Freq: Two times a day (BID) | ORAL | Status: DC
  Administered 2021-05-27: 25 mg via ORAL
  Filled 2021-05-27: qty 1

## 2021-05-27 NOTE — Progress Notes (Signed)
?Progress Note ? ? ?Patient: Jake Zappone PZW:[id number] DOB: [birth year] DOA: 05/25/2021     2 ?DOS: the patient was seen and examined on 05/27/2021 ?  ?Brief hospital course: ?Mr. [name] was admitted to the hospital with the working diagnosis of decompensated heart failure.  ? ?76 yo male with the past medical history of heart failure, COPD, hypertension, atrial fibrillation and asthma who presented with dyspnea and lower extremity edema. Reported worsening lower extremity edema for the last 6 weeks, he has gain 20 over last 2 weeks. Symptoms associated with dyspnea and 2 pillow orthopnea. On the day of admission home health nurse advice to come to the ED due worsening symptoms. On his initial physical examination his blood pressure is 101/57, HR 88, RR 16 and 02 saturation 92%, lungs with decreased breath sounds bilaterally, positive rales and wheezing, increased work of breathing, heart with S1 and S2 present and rhythmic, abdomen not distended and positive lower extremity edema ++.  ? ?Na 137, K 4,1 Cl 98, bicarbonate 32, glucose 162, bun 27, cr 1,2 ?BNP 570 ?Troponin 29-29  ?Wbc 6.3 hgb 10,0 hct 32, plt 91  ?INR 2,0  ? ?Chest radiograph with no cardiomegaly, bilateral interstitial infiltrates, small right pleural effusion.  ? ?EKG with 87 bpm, left axis deviation, normal intervals, atrial fibrillation with PVC, q wave V1 and V2 with no significant ST segment or T wave changes.  ? ?Assessment and Plan: ?* Acute on chronic diastolic CHF (congestive heart failure) ([place]) ?Echocardiogram with LV systolic function preserved EF 60 to 42%, RV systolic function preserved. Right atrial severe dilatation. (poor acoustic windows)  ? ?Edema is improving.  ?Urine output over last 24 hrs is 4,200 ml ?Blood pressure systolic is 353 mmHg. ? ?Continue aggressive diuresis with IV furosemide.  ?Added spironolactone and digoxin.  ? ?COPD with acute exacerbation ([place]) ?Continue bronchodilator therapy  ? ?Dyspnea likely more related to  acute pulmonary edema than COPD exacerbation. ?Continue oxymetry monitoring and supplemental 02 per [place] to keep 02 saturation 92% or greater.  ? ?Systemic steroids have been discontinued/  ?Patient is on supplemental home 02  ? ?A-fib ([place]) ?Continue rate control with ditliazem and anticoagulation with apixaban.  ?Continue telemetry monitoring.  ? ?Essential hypertension ?Blood pressure with systolic in the low 614'E ?Continue holding antihypertensive medications, continue diuresis with IV furosemide.  ? ?Rheumatoid arthritis ([place]) ?No active flare.  ?Follow up as outpatient.  ? ?Peripheral polyneuropathy ?Stable, continue medical therapy with gabapenitn.  ? ?Anemia ?Thrombocytopenia ?Likely anemia of chronic disease.  ?Cell count stable with hgb at 9,5 and plt at 94.  ? ?Class 1 obesity ?Calculated BMI is 31.5 ? ? ? ? ? ?  ? ?Subjective: Patient is feeling better, but not back to his baseline, dyspnea and lower extremity edema is improving.  ? ?Physical Exam: ?Vitals:  ? 05/26/21 2000 05/26/21 2253 05/27/21 0315 05/27/21 0830  ?BP: 111/68 107/62 105/74 107/61  ?Pulse: 99 98 77 (!) 112  ?Resp: 19 15 15 20   ?Temp: 98.2 ?F (36.8 ?C) 98.4 ?F (36.9 ?C) 98.4 ?F (36.9 ?C) 98.1 ?F (36.7 ?C)  ?TempSrc: Oral Oral Oral Axillary  ?SpO2: 91% 92% 95% 90%  ?Weight:   108.4 kg   ?Height:      ? ?Neurology awake and alert ?ENT with mild pallor ?Cardiovascular with S1 and S2 present irregularly irregular, no gallops, rubs or murmurs ?No JVD ?Positive lower extremity edema ++ pitting bilaterally ?Respiratory with no wheezing or rales, no rhonchi ?Abdomen not distended  ?  Data Reviewed: ? ? ? ?Family Communication: I spoke over the phone with the patient's daughter about patient's  condition, plan of care, prognosis and all questions were addressed.  ? ?Disposition: ?Status is: Inpatient ?Remains inpatient appropriate because: heart failure  ? Planned Discharge Destination: Home ? ?Author: ?Tawni Millers, MD ?05/27/2021 9:58  AM ? ?For on call review [email or web address].  ?

## 2021-05-27 NOTE — Assessment & Plan Note (Addendum)
Thrombocytopenia ?Likely anemia of chronic disease combined with  ?Iron deficiency anemia. ?Patient did received IV iron during his hospitalization. ?Plan to follow up iron panel as outpatient.  ?At the time of his discharge his hgb is 9.5 and plt 206   ? ?

## 2021-05-27 NOTE — Progress Notes (Signed)
? ?Progress Note ? ?Patient Name: Linzie Boursiquot ?Date of Encounter: 05/27/2021 ? ?Primary Cardiologist: Elouise Munroe, MD  ? ?Subjective  ? ?- 3L In interim. ?Patient notes that he is feeling better. ?Heart rates has increased with low BP; he is unable to tolerate further diltiazem ? ? ?Inpatient Medications  ?  ?Scheduled Meds: ? diltiazem  120 mg Oral Daily  ? folic acid  1 mg Oral Daily  ? furosemide  80 mg Intravenous BID  ? gabapentin  300 mg Oral BID  ? magnesium oxide  400 mg Oral Daily  ? sulfaSALAzine  500 mg Oral Daily  ? cyanocobalamin  1,000 mcg Oral Daily  ? warfarin  7.5 mg Oral ONCE-1600  ? Warfarin - Pharmacist Dosing Inpatient   Does not apply Z6109  ? ?Continuous Infusions: ? ?PRN Meds: ?acetaminophen **OR** acetaminophen, albuterol, magnesium hydroxide, ondansetron **OR** ondansetron (ZOFRAN) IV, traZODone  ? ?Vital Signs  ?  ?Vitals:  ? 05/26/21 1657 05/26/21 2000 05/26/21 2253 05/27/21 0315  ?BP: 109/89 111/68 107/62 105/74  ?Pulse: (!) 107 99 98 77  ?Resp: 18 19 15 15   ?Temp: 98.2 ?F (36.8 ?C) 98.2 ?F (36.8 ?C) 98.4 ?F (36.9 ?C) 98.4 ?F (36.9 ?C)  ?TempSrc: Oral Oral Oral Oral  ?SpO2: 91% 91% 92% 95%  ?Weight:    108.4 kg  ?Height:      ? ? ?Intake/Output Summary (Last 24 hours) at 05/27/2021 0832 ?Last data filed at 05/27/2021 6045 ?Gross per 24 hour  ?Intake 360 ml  ?Output 3700 ml  ?Net -3340 ml  ? ?Filed Weights  ? 05/25/21 2229 05/26/21 0314 05/27/21 0315  ?Weight: 110.6 kg 110.6 kg 108.4 kg  ? ? ?Telemetry  ?  ?AF RVR with PVCs and Ashman beats - Personally Reviewed ? ?Physical Exam  ? ?Gen: no distress   ?Neck: No JVD,  ?Cardiac: No Rubs or Gallops, soft holosystolic murmur, IRIR tachycardia, +2 radial pulses ?Respiratory: Bilateral wheezes, normal effort, normal  respiratory rate ?GI: Soft, nontenderly distended ?MS: +2 edema (compression stockings on);  moves all extremities ?Integument: Skin feels warm ?Neuro:  At time of evaluation, alert and oriented to person/place/time/situation   ?Psych: Normal affect, patient feels well ? ? ?Labs  ?  ?Chemistry ?Recent Labs  ?Lab 05/25/21 ?1721 05/26/21 ?0110 05/27/21 ?4098  ?NA 137 136 139  ?K 4.1 4.0 4.2  ?CL 98 97* 96*  ?CO2 32 29 35*  ?GLUCOSE 162* 199* 115*  ?BUN 27* 23 28*  ?CREATININE 1.22 1.15 1.16  ?CALCIUM 8.2* 8.2* 8.6*  ?PROT 5.8*  --   --   ?ALBUMIN 3.2*  --   --   ?AST 19  --   --   ?ALT 20  --   --   ?ALKPHOS 42  --   --   ?BILITOT 1.0  --   --   ?GFRNONAA >60 >60 >60  ?ANIONGAP 7 10 8   ?  ? ?Hematology ?Recent Labs  ?Lab 05/25/21 ?1721 05/26/21 ?0110  ?WBC 6.3 6.8  ?RBC 2.90* 2.76*  ?HGB 10.0* 9.5*  ?HCT 32.0* 30.5*  ?MCV 110.3* 110.5*  ?MCH 34.5* 34.4*  ?MCHC 31.3 31.1  ?RDW 15.8* 15.7*  ?PLT 91* 94*  ? ? ?Cardiac EnzymesNo results for input(s): TROPONINI in the last 168 hours. No results for input(s): TROPIPOC in the last 168 hours.  ? ?BNP ?Recent Labs  ?Lab 05/25/21 ?1721  ?BNP 570.9*  ?  ? ?DDimer No results for input(s): DDIMER in the last 168 hours.  ? ?  Radiology  ?  ?DG Chest 2 View ? ?Result Date: 05/25/2021 ?CLINICAL DATA:  Provided history: Rule out fluid overload. EXAM: CHEST - 2 VIEW COMPARISON:  Prior chest radiographs 07/14/2021 and earlier. CT angiogram chest 07/07/2018 FINDINGS: Heart size at the upper limits of normal. Aortic atherosclerosis. No appreciable airspace consolidation or pulmonary edema. Known emphysema better appreciated on the prior chest CT of 05/06/2021. No sizable pleural effusion or evidence of pneumothorax. No acute bony abnormality identified. Degenerative changes of the spine. IMPRESSION: No evidence of acute cardiopulmonary abnormality. Aortic Atherosclerosis (ICD10-I70.0) and Emphysema (ICD10-J43.9). Electronically Signed   By: Kellie Simmering D.O.   On: 05/25/2021 18:00  ? ?ECHOCARDIOGRAM COMPLETE ? ?Result Date: 05/26/2021 ?   ECHOCARDIOGRAM REPORT   Patient Name:   VERLIE HELLENBRAND Date of Exam: 05/26/2021 Medical Rec #:  [id number]     Height:       73.0 in Accession #:    [id number]    Weight:       243.8 lb  Date of Birth:  Nov 20, 1945    BSA:          2.341 m? Patient Age:    76 years      BP:           103/63 mmHg Patient Gender: M             HR:           92 bpm. Exam Location:  Inpatient Procedure: 2D Echo, Cardiac Doppler, Color Doppler and Intracardiac            Opacification Agent Indications:    CHF-acute diastolic  History:        Patient has prior history of Echocardiogram examinations, most                 recent 07/23/2019. CHF, CAD, COPD, Arrythmias:Atrial Fibrillation;                 Risk Factors:Hypertension.  Sonographer:    Clayton Lefort RDCS (AE) Referring Phys: [id number] JAN A MANSY  Sonographer Comments: Technically challenging study due to limited acoustic windows, Technically difficult study due to poor echo windows, suboptimal parasternal window, suboptimal apical window, suboptimal subcostal window and patient is morbidly obese. IMPRESSIONS  1. Left ventricular ejection fraction, by estimation, is 60 to 65%. The left ventricle has normal function. The left ventricle has no regional wall motion abnormalities. There is moderate left ventricular hypertrophy. Left ventricular diastolic function  could not be evaluated.  2. Right ventricular systolic function is low normal. The right ventricular size is moderately enlarged. There is moderately elevated pulmonary artery systolic pressure. The estimated right ventricular systolic pressure is 31.4 mmHg.  3. Left atrial size was mildly dilated.  4. Right atrial size was severely dilated.  5. The mitral valve is abnormal. Trivial mitral valve regurgitation.  6. The aortic valve was not well visualized. Aortic valve regurgitation is not visualized. No aortic stenosis is present.  7. Aortic dilatation noted. There is borderline dilatation of the aortic root, measuring 39 mm.  8. The inferior vena cava is dilated in size with <50% respiratory variability, suggesting right atrial pressure of 15 mmHg. Comparison(s): Changes from prior study are noted. 07/23/2019: LVEF  55-60%, RVSP 36 mmHg. FINDINGS  Left Ventricle: Left ventricular ejection fraction, by estimation, is 60 to 65%. The left ventricle has normal function. The left ventricle has no regional wall motion abnormalities. Definity contrast agent was given IV to delineate the left ventricular  endocardial borders. The left ventricular internal cavity size was normal in size. There is moderate left ventricular hypertrophy. Left ventricular diastolic function could not be evaluated due to atrial fibrillation. Left ventricular diastolic function  could not be evaluated. Right Ventricle: The right ventricular size is moderately enlarged. No increase in right ventricular wall thickness. Right ventricular systolic function is low normal. There is moderately elevated pulmonary artery systolic pressure. The tricuspid regurgitant velocity is 3.21 m/s, and with an assumed right atrial pressure of 15 mmHg, the estimated right ventricular systolic pressure is 29.9 mmHg. Left Atrium: Left atrial size was mildly dilated. Right Atrium: Right atrial size was severely dilated. Pericardium: There is no evidence of pericardial effusion. Mitral Valve: The mitral valve is abnormal. Mild to moderate mitral annular calcification. Trivial mitral valve regurgitation. Tricuspid Valve: The tricuspid valve is grossly normal. Tricuspid valve regurgitation is mild. Aortic Valve: The aortic valve was not well visualized. Aortic valve regurgitation is not visualized. No aortic stenosis is present. Pulmonic Valve: The pulmonic valve was grossly normal. Pulmonic valve regurgitation is not visualized. Aorta: Aortic dilatation noted. There is borderline dilatation of the aortic root, measuring 39 mm. Venous: The inferior vena cava is dilated in size with less than 50% respiratory variability, suggesting right atrial pressure of 15 mmHg. IAS/Shunts: The interatrial septum appears to be lipomatous. The interatrial septum was not well visualized.  LEFT VENTRICLE  PLAX 2D LVIDd:         4.60 cm LVIDs:         3.10 cm LV PW:         1.40 cm LV IVS:        1.40 cm LVOT diam:     2.00 cm LV SV:         37 LV SV Index:   16 LVOT Area:     3.14 cm?  RIGHT VENTRICLE

## 2021-05-27 NOTE — Progress Notes (Signed)
Heart Failure Stewardship Pharmacist Progress Note ? ? ?PCP: Glendon Axe, MD ?PCP-Cardiologist: Elouise Munroe, MD  ? ? ?HPI:  ?76 yo M with PMH of CHF, cor pulmonale, CAD, HTN, afib, AAA, NSCLC s/p radiation, RA, CKD, tobacco use, and asthma/COPD. He presented to the ED on 4/5 with worsening LE edema, weight gain, dyspnea, and orthopnea. CXR with no evidence of acute cardiopulmonary disease. An ECHO was done on 4/6 and LVEF is 60-65% with moderate LVH and low normal RV function.  ? ?Current HF Medications: ?Diuretic: furosemide 80 mg IV BID ?Aldosterone Antagonist: spironolactone 12.5 mg daily ?Other: digoxin 0.25 mg daily ? ?Prior to admission HF Medications: ?Diuretic: furosemide 20 mg PRN ? ?Pertinent Lab Values: ?Serum creatinine 1.16, BUN 28, Potassium 4.2, Sodium 139, BNP 570.9, digoxin level due ~4/12 ? ?Vital Signs: ?Weight: 238 lbs (admission weight: 243 lbs) ?Blood pressure: 100/60s  ?Heart rate: 90-100s  ?I/O: -3.8L yesterday; net -5L ? ?Medication Assistance / Insurance Benefits Check: ?Does the patient have prescription insurance?  Yes ?Type of insurance plan: [employer] ? ?Outpatient Pharmacy:  ?[facility] ?Is the patient willing to use [facility] pharmacy at discharge? Yes ?Is the patient willing to transition their outpatient pharmacy to utilize a [facility] outpatient pharmacy?   Pending ?  ? ?Assessment: ?1. Acute on chronic diastolic CHF (EF 17-61%) due to multifactorial NICM (COPD, OSA, tobacco use, cor pulmonale). NYHA class III symptoms. ?- Agree with increasing to furosemide 80 mg IV BID (none given this AM and ordered to start tonight - will clarify timing with cardiology) ?- BP too soft for ACE/ARB/ARNI ?- Continue spironolactone 12.5 mg daily ?- Consider adding SGLT2i prior to discharge - can see if he would be a candidate for patient assistance if cost is prohibitive  ?- Agree with adding digoxin 0.25 mg daily - level due in 1 week ?   ?Plan: ?1) Medication changes recommended at this time: ?- Continue IV diuresis - clarify timing of dose increase with cardiology ? ?2) Patient assistance: ?- $374.23 remaining on deductible ? ?3)  Education  ?- To be completed prior to discharge ? ?Kerby Nora, PharmD, BCPS ?Heart Failure Stewardship Pharmacist ?Phone [phone] ? ? ?

## 2021-05-27 NOTE — Assessment & Plan Note (Signed)
Calculated BMI is 31.5 ? ?

## 2021-05-27 NOTE — Progress Notes (Signed)
ANTICOAGULATION CONSULT NOTE  ? ?Pharmacy Consult for Warfarin  ?Indication: atrial fibrillation ? ?Allergies  ?Allergen Reactions  ? Propoxyphene Nausea And Vomiting  ? ? ?Patient Measurements: ?Height: 6\' 1"  (185.4 cm) ?Weight: 108.4 kg (238 lb 14.4 oz) ?IBW/kg (Calculated) : 79.9 ?Vital Signs: ?Temp: 98.4 ?F (36.9 ?C) (04/07 0315) ?Temp Source: Oral (04/07 0315) ?BP: 105/74 (04/07 0315) ?Pulse Rate: 77 (04/07 0315) ? ?Labs: ?Recent Labs  ?  05/25/21 ?1721 05/25/21 ?1910 05/26/21 ?0110 05/26/21 ?1517 05/27/21 ?6160  ?HGB 10.0*  --  9.5*  --   --   ?HCT 32.0*  --  30.5*  --   --   ?PLT 91*  --  94*  --   --   ?LABPROT 22.4*  --   --  25.8* 26.1*  ?INR 2.0*  --   --  2.4* 2.4*  ?CREATININE 1.22  --  1.15  --  1.16  ?TROPONINIHS 29* 29*  --   --   --   ? ? ? ?Estimated Creatinine Clearance: 68.9 mL/min (by C-G formula based on SCr of 1.16 mg/dL). ? ? ?Medical History: ?Past Medical History:  ?Diagnosis Date  ? Anemia   ? Asthma   ? Chronic diastolic (congestive) heart failure (H[facility])   ? Chronic respiratory failure ([place])   ? CKD (chronic kidney disease) stage 2, GFR 60-89 ml/min   ? COPD (chronic obstructive pulmonary disease) ([place])   ? Cor pulmonale (H[facility])   ? Coronary artery calcification   ? Coronary artery disease   ? Hepatitis A   ? Hypertension   ? Lung cancer ([place])   ? OSA (obstructive sleep apnea)   ? Permanent atrial fibrillation ([place])   ? Pulmonary hypertension ([place])   ? Rheumatoid arthritis ([place])   ? Thrombocytopenia ([place])   ? ? ? ?Assessment: ?76 y/o M with heart failure exacerbation. On warfarin PTA for afib. INR today is in range at 2.4.  No overt bleeding or complications noted. ? ?Recently discharged from [facility] on warfarin 7.5 mg Mon/Wed/Fri/Sun and 5 mg all other days ? ?Goal of Therapy:  ?INR 2-3 ?Monitor platelets by anticoagulation protocol: Yes ?  ?Plan:  ?Warfarin 7.5 mg PO x 1 tonight (c/w home dose) ?Daily PT/INR ?Monitor for bleeding ? ?[place] Crane, Pharm D, BCPS,  BCCP ?Clinical Pharmacist ? 05/27/2021 7:56 AM  ? ?[facility] pharmacy phone numbers are listed on [email or web address] ? ? ? ?

## 2021-05-28 ENCOUNTER — Inpatient Hospital Stay (HOSPITAL_COMMUNITY): Payer: Medicare Other

## 2021-05-28 DIAGNOSIS — I5033 Acute on chronic diastolic (congestive) heart failure: Secondary | ICD-10-CM | POA: Diagnosis not present

## 2021-05-28 DIAGNOSIS — J441 Chronic obstructive pulmonary disease with (acute) exacerbation: Secondary | ICD-10-CM | POA: Diagnosis not present

## 2021-05-28 DIAGNOSIS — N182 Chronic kidney disease, stage 2 (mild): Secondary | ICD-10-CM | POA: Diagnosis not present

## 2021-05-28 DIAGNOSIS — N179 Acute kidney failure, unspecified: Secondary | ICD-10-CM | POA: Diagnosis present

## 2021-05-28 DIAGNOSIS — I482 Chronic atrial fibrillation, unspecified: Secondary | ICD-10-CM | POA: Diagnosis not present

## 2021-05-28 LAB — BASIC METABOLIC PANEL
Anion gap: 10 (ref 5–15)
BUN: 35 mg/dL — ABNORMAL HIGH (ref 8–23)
CO2: 35 mmol/L — ABNORMAL HIGH (ref 22–32)
Calcium: 8.7 mg/dL — ABNORMAL LOW (ref 8.9–10.3)
Chloride: 93 mmol/L — ABNORMAL LOW (ref 98–111)
Creatinine, Ser: 1.25 mg/dL — ABNORMAL HIGH (ref 0.61–1.24)
GFR, Estimated: 59 mL/min — ABNORMAL LOW (ref >60)
Glucose, Bld: 95 mg/dL (ref 70–99)
Potassium: 3.8 mmol/L (ref 3.5–5.1)
Sodium: 138 mmol/L (ref 135–145)

## 2021-05-28 LAB — PROTIME-INR
INR: 2.2 — ABNORMAL HIGH (ref 0.8–1.2)
Prothrombin Time: 23.8 seconds — ABNORMAL HIGH (ref 11.4–15.2)

## 2021-05-28 MED ORDER — WARFARIN SODIUM 5 MG PO TABS
5.0000 mg | ORAL_TABLET | Freq: Once | ORAL | Status: AC
  Administered 2021-05-28: 5 mg via ORAL
  Filled 2021-05-28: qty 1

## 2021-05-28 MED ORDER — POTASSIUM CHLORIDE CRYS ER 20 MEQ PO TBCR
40.0000 meq | EXTENDED_RELEASE_TABLET | Freq: Once | ORAL | Status: AC
Start: 2021-05-28 — End: 2021-05-28
  Administered 2021-05-28: 40 meq via ORAL
  Filled 2021-05-28: qty 2

## 2021-05-28 MED ORDER — METOPROLOL TARTRATE 50 MG PO TABS
50.0000 mg | ORAL_TABLET | Freq: Two times a day (BID) | ORAL | Status: DC
  Administered 2021-05-28 – 2021-05-29 (×4): 50 mg via ORAL
  Filled 2021-05-28 (×5): qty 1

## 2021-05-28 MED ORDER — BUDESONIDE 0.25 MG/2ML IN SUSP
0.2500 mg | Freq: Two times a day (BID) | RESPIRATORY_TRACT | Status: DC
  Administered 2021-05-28 – 2021-06-11 (×28): 0.25 mg via RESPIRATORY_TRACT
  Filled 2021-05-28 (×30): qty 2

## 2021-05-28 MED ORDER — DILTIAZEM HCL ER COATED BEADS 180 MG PO CP24: 180.0000 mg | ORAL_CAPSULE | Freq: Every day | ORAL | Status: DC

## 2021-05-28 MED ORDER — DILTIAZEM HCL ER COATED BEADS 240 MG PO CP24
240.0000 mg | ORAL_CAPSULE | Freq: Every day | ORAL | Status: DC
  Administered 2021-05-28 – 2021-05-29 (×2): 240 mg via ORAL
  Filled 2021-05-28 (×2): qty 1

## 2021-05-28 MED ORDER — DIGOXIN 125 MCG PO TABS
0.1250 mg | ORAL_TABLET | Freq: Every day | ORAL | Status: DC
  Administered 2021-05-28 – 2021-05-29 (×2): 0.125 mg via ORAL
  Filled 2021-05-28 (×2): qty 1

## 2021-05-28 MED ORDER — FUROSEMIDE 10 MG/ML IJ SOLN
40.0000 mg | Freq: Two times a day (BID) | INTRAMUSCULAR | Status: DC
  Administered 2021-05-28 – 2021-05-29 (×3): 40 mg via INTRAVENOUS
  Filled 2021-05-28 (×3): qty 4

## 2021-05-28 NOTE — Evaluation (Signed)
Physical Therapy Evaluation ?Patient Details ?Name: Jake [name] ?MRN: [id number] ?DOB: 1945-04-22 ?Today's Date: 05/28/2021 ? ?History of Present Illness ? Pt is a 76 y.o. M who presents 05/25/2021 with working diagnosis of decompensated heart failure. Significant PMH: heart failure, COPD, HTN, atrial fibrillation, asthma, peripheral polyneuropathy, RA.  ?Clinical Impression ? PTA, pt lives with his family, uses a cane intermittently and is modI with ADL's. Pt presents with decreased functional mobility secondary to impaired standing balance, decreased cardiopulmonary endurance and weakness. Pt ambulating 100 ft with a walker at a min guard assist level; SpO2 85-92% on 3L O2, HR 111-119, BP 104/89 (95) post mobility. Recommend continued HHPT at d/c to address. ?   ? ?Recommendations for follow up therapy are one component of a multi-disciplinary discharge planning process, led by the attending physician.  Recommendations may be updated based on patient status, additional functional criteria and insurance authorization. ? ?Follow Up Recommendations Home health PT ? ?  ?Assistance Recommended at Discharge PRN  ?Patient can return home with the following ? A little help with walking and/or transfers;A little help with bathing/dressing/bathroom ? ?  ?Equipment Recommendations None recommended by PT  ?Recommendations for Other Services ?    ?  ?Functional Status Assessment Patient has had a recent decline in their functional status and demonstrates the ability to make significant improvements in function in a reasonable and predictable amount of time.  ? ?  ?Precautions / Restrictions Precautions ?Precautions: Fall ?Restrictions ?Weight Bearing Restrictions: No  ? ?  ? ?Mobility ? Bed Mobility ?Overal bed mobility: Modified Independent ?  ?  ?  ?  ?  ?  ?  ?  ? ?Transfers ?Overall transfer level: Needs assistance ?Equipment used: Rolling walker (2 wheels) ?Transfers: Sit to/from Stand ?Sit to Stand: Min guard ?  ?  ?  ?  ?   ?General transfer comment: Increased time to rise, no physical assist required ?  ? ?Ambulation/Gait ?Ambulation/Gait assistance: Min guard ?Gait Distance (Feet): 100 Feet ?Assistive device: Rolling walker (2 wheels) ?Gait Pattern/deviations: Step-through pattern, Decreased stride length, Decreased dorsiflexion - right, Decreased dorsiflexion - left ?Gait velocity: decreased ?  ?  ?General Gait Details: Mild dynamic instability, min guard assist for safety, fatigues easily ? ?Stairs ?  ?  ?  ?  ?  ? ?Wheelchair Mobility ?  ? ?Modified Rankin (Stroke Patients Only) ?  ? ?  ? ?Balance Overall balance assessment: Mild deficits observed, not formally tested ?  ?  ?  ?  ?  ?  ?  ?  ?  ?  ?  ?  ?  ?  ?  ?  ?  ?  ?   ? ? ? ?Pertinent Vitals/Pain Pain Assessment ?Pain Assessment: Faces ?Faces Pain Scale: Hurts little more ?Pain Location: Dorsal surface of R foot ?Pain Descriptors / Indicators: Discomfort, Grimacing ?Pain Intervention(s): Monitored during session  ? ? ?Home Living Family/patient expects to be discharged to:: Private residence ?Living Arrangements: Spouse/significant other;Other relatives (grandson) ?Available Help at Discharge: Family ?Type of Home: House ?Home Access: Ramped entrance ?  ?  ?  ?Home Layout: One level ?Home Equipment: Cane - single Barista (2 wheels);Wheelchair - manual;BSC/3in1 ?   ?  ?Prior Function Prior Level of Function : Independent/Modified Independent;Driving ?  ?  ?  ?  ?  ?  ?Mobility Comments: uses cane intermittently, denies falls, ? fairly sedentary ?  ?  ? ? ?Hand Dominance  ?   ? ?  ?Extremity/Trunk  Assessment  ? Upper Extremity Assessment ?Upper Extremity Assessment: Defer to OT evaluation ?  ? ?Lower Extremity Assessment ?Lower Extremity Assessment: Overall WFL for tasks assessed ?  ? ?   ?Communication  ? Communication: HOH  ?Cognition Arousal/Alertness: Awake/alert ?Behavior During Therapy: [facility] for tasks assessed/performed ?Overall Cognitive Status: Within  Functional Limits for tasks assessed ?  ?  ?  ?  ?  ?  ?  ?  ?  ?  ?  ?  ?  ?  ?  ?  ?  ?  ?  ? ?  ?General Comments   ? ?  ?Exercises    ? ?Assessment/Plan  ?  ?PT Assessment Patient needs continued PT services  ?PT Problem List Decreased strength;Decreased activity tolerance;Decreased balance;Decreased mobility;Impaired sensation ? ?   ?  ?PT Treatment Interventions DME instruction;Gait training;Functional mobility training;Therapeutic activities;Therapeutic exercise;Balance training;Patient/family education   ? ?PT Goals (Current goals can be found in the Care Plan section)  ?Acute Rehab PT Goals ?Patient Stated Goal: get stronger ?PT Goal Formulation: With patient ?Time For Goal Achievement: 06/11/21 ?Potential to Achieve Goals: Good ? ?  ?Frequency Min 3X/week ?  ? ? ?Co-evaluation   ?  ?  ?  ?  ? ? ?  ?AM-PAC PT "6 Clicks" Mobility  ?Outcome Measure Help needed turning from your back to your side while in a flat bed without using bedrails?: None ?Help needed moving from lying on your back to sitting on the side of a flat bed without using bedrails?: None ?Help needed moving to and from a bed to a chair (including a wheelchair)?: A Little ?Help needed standing up from a chair using your arms (e.g., wheelchair or bedside chair)?: A Little ?Help needed to walk in hospital room?: A Little ?Help needed climbing 3-5 steps with a railing? : A Lot ?6 Click Score: 19 ? ?  ?End of Session Equipment Utilized During Treatment: Oxygen ?Activity Tolerance: Patient tolerated treatment well ?Patient left: in chair;with call bell/phone within reach ?Nurse Communication: Mobility status ?PT Visit Diagnosis: Unsteadiness on feet (R26.81);Difficulty in walking, not elsewhere classified (R26.2) ?  ? ?Time: 9323-5573 ?PT Time Calculation (min) (ACUTE ONLY): 21 min ? ? ?Charges:   PT Evaluation ?$PT Eval Moderate Complexity: 1 Mod ?  ?  ?   ? ? ?Jake [name], PT, DPT ?Acute Rehabilitation Services ?Pager [phone] ?Office  [phone] ? ? ?Jake [name] ?05/28/2021, 12:24 PM ? ?

## 2021-05-28 NOTE — Progress Notes (Signed)
?Progress Note ? ? ?Patient: Jake [name] CWC:[id number] DOB: [birth year] DOA: 05/25/2021     3 ?DOS: the patient was seen and examined on 05/28/2021 ?  ?Brief hospital course: ?Mr. Jake [name] was admitted to the hospital with the working diagnosis of decompensated heart failure.  ? ?76 yo male with the past medical history of heart failure, COPD, hypertension, atrial fibrillation and asthma who presented with dyspnea and lower extremity edema. Reported worsening lower extremity edema for the last 6 weeks, he has gain 20 over last 2 weeks. Symptoms associated with dyspnea and 2 pillow orthopnea. On the day of admission home health nurse advice to come to the ED due worsening symptoms. On his initial physical examination his blood pressure is 101/57, HR 88, RR 16 and 02 saturation 92%, lungs with decreased breath sounds bilaterally, positive rales and wheezing, increased work of breathing, heart with S1 and S2 present and rhythmic, abdomen not distended and positive lower extremity edema ++.  ? ?Na 137, K 4,1 Cl 98, bicarbonate 32, glucose 162, bun 27, cr 1,2 ?BNP 570 ?Troponin 29-29  ?Wbc 6.3 hgb 10,0 hct 32, plt 91  ?INR 2,0  ? ?Chest radiograph with no cardiomegaly, bilateral interstitial infiltrates, small right pleural effusion.  ? ?EKG with 87 bpm, left axis deviation, normal intervals, atrial fibrillation with PVC, q wave V1 and V2 with no significant ST segment or T wave changes.  ? ?Assessment and Plan: ?* Acute on chronic diastolic CHF (congestive heart failure) ([place]) ?Echocardiogram with LV systolic function preserved EF 60 to 76%, RV systolic function preserved. Right atrial severe dilatation. (poor acoustic windows)  ? ?Urine output over last 24 hrs is 6,250 ml with improvement in lower extremity edema.  ?Blood pressure systolic is 160 to 737 mmHg. ? ?Furosemide 40 mg IV q12 hrs ?Spironolactone 25 mg ?Digoxin 0,125 mg  ? ? ?COPD with acute exacerbation ([place]) ?Continue bronchodilator therapy  ? ?Continue oxymetry  monitoring and supplemental 02 per [place] to keep 02 saturation 92% or greater.  ? ?Continue with bronchodilator therapy and will add inhaled corticosteroids.  ?Holding on systemic steroids for now.  ?Patient is on supplemental home 02  ? ?A-fib ([place]) ?Patient now with uncontrolled atrial fibrillation with rapid ventricular response. ?Worsening heart rate yesterday evening, added metoprolol 25 mg last night with HR down to 90 and 100. ?This am HR up to 150. ?Home medications include metoprolol XL 50 mg and diltiazem 180 mg. ? ?Plan to increase diltiazem to 240 mg and metoprolol to 50 mg po bid tartrate.  ?Continue anticoagulation with warfarin (INR is 2.2) ?Continue close telemetry monitoring.  ? ?CKD (chronic kidney disease) stage 2, GFR 60-89 ml/min ?Renal function with serum cr at 1,25, K is 3,8 and serum bicarbonate at 35. ?Urine output is 6,250 ml over last 24 hrs. ? ?Plan to add 40 meq Kcl to prevent hypokalemia ?Check renal function and electrolytes including Mg in am. ?Avoid hypotension and nephrotoxic medications.  ? ?Essential hypertension ?Continue blood pressure monitoring ?Continue with furosemide and spironolactone.  ? ?Rheumatoid arthritis ([place]) ?No active flare.  ?Follow up as outpatient.  ? ?Peripheral polyneuropathy ?Stable, continue medical therapy with gabapenitn.  ? ?Anemia ?Thrombocytopenia ?Likely anemia of chronic disease.  ?Cell count stable with hgb at 9,5 and plt at 94.  ? ?Class 1 obesity ?Calculated BMI is 31.5 ? ? ? ? ? ?  ? ?Subjective: patient with improvement in his symptoms, including decreased lower extremity edema,  ? ?Physical Exam: ?Vitals:  ? 05/28/21 1062  05/28/21 0500 05/28/21 0735 05/28/21 0942  ?BP: 104/68  108/82   ?Pulse: 79  (!) 120 (!) 137  ?Resp: 15  16   ?Temp: 98 ?F (36.7 ?C)  97.8 ?F (36.6 ?C)   ?TempSrc: Oral  Oral   ?SpO2: 93%  95%   ?Weight:  102.3 kg    ?Height:      ? ?Neurology awake and alert ?ENT with no pallor ?Cardiovascular with S1 and S2 present irregularly  irregular tachycardic, with no gallops or rubs ?No JVD ?Lower extremity with + /++ pitting edema at the ankles ?Respiratory with scattered rales no rhonchi ?Abdomen protuberant but not distended  ? ?Data Reviewed: ? ? ? ?Family Communication: no family at the bedside  ? ?Disposition: ?Status is: Inpatient ?Remains inpatient appropriate because: atrial fibrillation with RVR  ? Planned Discharge Destination: Home ? ?Author: ?Tawni Millers, MD ?05/28/2021 9:46 AM ? ?For on call review [email or web address].  ?

## 2021-05-28 NOTE — Assessment & Plan Note (Addendum)
CKD stage 2, Hyponatremia. ? ?Patient tolerated well diuresis, his renal function was closely followed. ?At the time of his discharge his renal function has a serum cr of 1,18 with K at 4,7 and serum bicarbonate at 33.  ? ?At home patient will continue diuresis with 40 mg of furosemide daily and have a close follow up.  ?

## 2021-05-28 NOTE — Progress Notes (Signed)
ANTICOAGULATION CONSULT NOTE  ? ?Pharmacy Consult for Warfarin  ?Indication: atrial fibrillation ? ?Allergies  ?Allergen Reactions  ? Tape Other (See Comments)  ?  Patient takes Coumadin!! Tape BRUISES and TEARS THE SKIN  ? Propoxyphene Nausea And Vomiting  ? ? ?Patient Measurements: ?Height: 6\' 1"  (185.4 cm) ?Weight: 102.3 kg (225 lb 8.5 oz) ?IBW/kg (Calculated) : 79.9 ?Vital Signs: ?Temp: 97.8 ?F (36.6 ?C) (04/08 1243) ?Temp Source: Oral (04/08 1243) ?BP: 102/52 (04/08 1243) ?Pulse Rate: 87 (04/08 1243) ? ?Labs: ?Recent Labs  ?  05/25/21 ?1721 05/25/21 ?1910 05/26/21 ?0110 05/26/21 ?1610 05/27/21 ?9604 05/28/21 ?5409  ?HGB 10.0*  --  9.5*  --   --   --   ?HCT 32.0*  --  30.5*  --   --   --   ?PLT 91*  --  94*  --   --   --   ?LABPROT 22.4*  --   --  25.8* 26.1* 23.8*  ?INR 2.0*  --   --  2.4* 2.4* 2.2*  ?CREATININE 1.22  --  1.15  --  1.16 1.25*  ?TROPONINIHS 29* 29*  --   --   --   --   ? ? ? ?Estimated Creatinine Clearance: 62.2 mL/min (A) (by C-G formula based on SCr of 1.25 mg/dL (H)). ? ? ?Medical History: ?Past Medical History:  ?Diagnosis Date  ? Anemia   ? Asthma   ? Chronic diastolic (congestive) heart failure (HCC)   ? Chronic respiratory failure ([place])   ? CKD (chronic kidney disease) stage 2, GFR 60-89 ml/min   ? COPD (chronic obstructive pulmonary disease) ([place])   ? Cor pulmonale (HCC)   ? Coronary artery calcification   ? Coronary artery disease   ? Hepatitis A   ? Hypertension   ? Lung cancer ([place])   ? OSA (obstructive sleep apnea)   ? Permanent atrial fibrillation ([place])   ? Pulmonary hypertension ([place])   ? Rheumatoid arthritis ([place])   ? Thrombocytopenia ([place])   ? ? ? ?Assessment: ?76 y/o M with heart failure exacerbation. On warfarin PTA for afib. INR today is in range at 2.2.  No overt bleeding or complications noted. ? ?Recently discharged from [facility] on warfarin 7.5 mg Mon/Wed/Fri/Sun and 5 mg all other days. No bleeding noted, ordered CBC for AM. ? ?Goal of Therapy:  ?INR  2-3 ?Monitor platelets by anticoagulation protocol: Yes ?  ?Plan:  ?Warfarin 5 mg PO x 1 tonight (c/w home dose) ?Daily PT/INR ?Monitor for bleeding ? ?Thank you for allowing pharmacy to participate in this patient's care. ? ?Reatha Harps, PharmD ?PGY1 Pharmacy Resident ?05/28/2021 3:24 PM ?Check [email or web address] for unit specific pharmacy number ? ? ? ? ?

## 2021-05-28 NOTE — Plan of Care (Signed)

## 2021-05-28 NOTE — Progress Notes (Signed)
?Cardiology Progress Note  ?Patient ID: Jake [name] ?MRN: [id number] ?DOB: Sep 07, 1945 ?Date of Encounter: 05/28/2021 ? ?Primary Cardiologist: Elouise Munroe, MD ? ?Subjective  ? ?Chief Complaint: SOB ? ?HPI: Good diuresis overnight.  Heart rate poorly controlled.  Still with diffuse rhonchi and wheezing. ? ?ROS:  ?All other ROS reviewed and negative. Pertinent positives noted in the HPI.    ? ?Inpatient Medications  ?Scheduled Meds: ? digoxin  0.125 mg Oral Daily  ? diltiazem  240 mg Oral Daily  ? folic acid  1 mg Oral Daily  ? furosemide  40 mg Intravenous BID  ? gabapentin  300 mg Oral BID  ? magnesium oxide  400 mg Oral Daily  ? metoprolol tartrate  50 mg Oral BID  ? spironolactone  12.5 mg Oral Daily  ? sulfaSALAzine  500 mg Oral Daily  ? cyanocobalamin  1,000 mcg Oral Daily  ? Warfarin - Pharmacist Dosing Inpatient   Does not apply G2694  ? ?Continuous Infusions: ? ?PRN Meds: ?acetaminophen **OR** acetaminophen, albuterol, magnesium hydroxide, ondansetron **OR** ondansetron (ZOFRAN) IV, traZODone  ? ?Vital Signs  ? ?Vitals:  ? 05/27/21 2347 05/28/21 0332 05/28/21 0500 05/28/21 0735  ?BP: 119/63 104/68  108/82  ?Pulse: 91 79  (!) 120  ?Resp: (!) 24 15  16   ?Temp: 97.8 ?F (36.6 ?C) 98 ?F (36.7 ?C)  97.8 ?F (36.6 ?C)  ?TempSrc: Oral Oral  Oral  ?SpO2: 92% 93%  95%  ?Weight:   102.3 kg   ?Height:      ? ? ?Intake/Output Summary (Last 24 hours) at 05/28/2021 0903 ?Last data filed at 05/28/2021 0200 ?Gross per 24 hour  ?Intake 240 ml  ?Output 6250 ml  ?Net -6010 ml  ? ? ?  05/28/2021  ?  5:00 AM 05/27/2021  ?  3:15 AM 05/26/2021  ?  3:14 AM  ?Last 3 Weights  ?Weight (lbs) 225 lb 8.5 oz 238 lb 14.4 oz 243 lb 13.3 oz  ?Weight (kg) 102.3 kg 108.364 kg 110.6 kg  ?   ? ?Telemetry  ?Overnight telemetry shows A-fib rates 120 to 150 bpm, which I personally reviewed.  ? ?Physical Exam  ? ?Vitals:  ? 05/27/21 2347 05/28/21 0332 05/28/21 0500 05/28/21 0735  ?BP: 119/63 104/68  108/82  ?Pulse: 91 79  (!) 120  ?Resp: (!) 24 15  16    ?Temp: 97.8 ?F (36.6 ?C) 98 ?F (36.7 ?C)  97.8 ?F (36.6 ?C)  ?TempSrc: Oral Oral  Oral  ?SpO2: 92% 93%  95%  ?Weight:   102.3 kg   ?Height:      ?  ?Intake/Output Summary (Last 24 hours) at 05/28/2021 0903 ?Last data filed at 05/28/2021 0200 ?Gross per 24 hour  ?Intake 240 ml  ?Output 6250 ml  ?Net -6010 ml  ?  ? ?  05/28/2021  ?  5:00 AM 05/27/2021  ?  3:15 AM 05/26/2021  ?  3:14 AM  ?Last 3 Weights  ?Weight (lbs) 225 lb 8.5 oz 238 lb 14.4 oz 243 lb 13.3 oz  ?Weight (kg) 102.3 kg 108.364 kg 110.6 kg  ?  Body mass index is 29.76 kg/m?.  ?General: Well nourished, well developed, in no acute distress ?Head: Atraumatic, normal size  ?Eyes: PEERLA, EOMI  ?Neck: Supple, JVD 7 to 8 cm of water ?Endocrine: No thryomegaly ?Cardiac: Normal S1, S2; irregular rhythm, no murmurs ?Lungs: Diffuse rales and rhonchi bilaterally ?Abd: Soft, nontender, no hepatomegaly  ?Ext: Trace edema ?Musculoskeletal: No deformities, BUE and BLE strength normal  and equal ?Skin: Warm and dry, no rashes   ?Neuro: Alert and oriented to person, place, time, and situation, CNII-XII grossly intact, no focal deficits  ?Psych: Normal mood and affect  ? ?Labs  ?High Sensitivity Troponin:   ?Recent Labs  ?Lab 05/25/21 ?1721 05/25/21 ?1910  ?TROPONINIHS 29* 29*  ?   ?Cardiac EnzymesNo results for input(s): TROPONINI in the last 168 hours. No results for input(s): TROPIPOC in the last 168 hours.  ?Chemistry ?Recent Labs  ?Lab 05/25/21 ?1721 05/26/21 ?0110 05/27/21 ?3295 05/28/21 ?1884  ?NA 137 136 139 138  ?K 4.1 4.0 4.2 3.8  ?CL 98 97* 96* 93*  ?CO2 32 29 35* 35*  ?GLUCOSE 162* 199* 115* 95  ?BUN 27* 23 28* 35*  ?CREATININE 1.22 1.15 1.16 1.25*  ?CALCIUM 8.2* 8.2* 8.6* 8.7*  ?PROT 5.8*  --   --   --   ?ALBUMIN 3.2*  --   --   --   ?AST 19  --   --   --   ?ALT 20  --   --   --   ?ALKPHOS 42  --   --   --   ?BILITOT 1.0  --   --   --   ?GFRNONAA >60 >60 >60 59*  ?ANIONGAP 7 10 8 10   ?  ?Hematology ?Recent Labs  ?Lab 05/25/21 ?1721 05/26/21 ?0110  ?WBC 6.3 6.8  ?RBC  2.90* 2.76*  ?HGB 10.0* 9.5*  ?HCT 32.0* 30.5*  ?MCV 110.3* 110.5*  ?MCH 34.5* 34.4*  ?MCHC 31.3 31.1  ?RDW 15.8* 15.7*  ?PLT 91* 94*  ? ?BNP ?Recent Labs  ?Lab 05/25/21 ?1721  ?BNP 570.9*  ?  ?DDimer No results for input(s): DDIMER in the last 168 hours.  ? ?Radiology  ?ECHOCARDIOGRAM COMPLETE ? ?Result Date: 05/26/2021 ?   ECHOCARDIOGRAM REPORT   Patient Name:   HEITH HAIGLER Date of Exam: 05/26/2021 Medical Rec #:  [id number]     Height:       73.0 in Accession #:    [id number]    Weight:       243.8 lb Date of Birth:  [birth year]    BSA:          2.341 m? Patient Age:    76 years      BP:           103/63 mmHg Patient Gender: M             HR:           92 bpm. Exam Location:  Inpatient Procedure: 2D Echo, Cardiac Doppler, Color Doppler and Intracardiac            Opacification Agent Indications:    CHF-acute diastolic  History:        Patient has prior history of Echocardiogram examinations, most                 recent 07/23/2019. CHF, CAD, COPD, Arrythmias:Atrial Fibrillation;                 Risk Factors:Hypertension.  Sonographer:    Clayton Lefort RDCS (AE) Referring Phys: [id number] JAN A MANSY  Sonographer Comments: Technically challenging study due to limited acoustic windows, Technically difficult study due to poor echo windows, suboptimal parasternal window, suboptimal apical window, suboptimal subcostal window and patient is morbidly obese. IMPRESSIONS  1. Left ventricular ejection fraction, by estimation, is 60 to 65%. The left ventricle has normal function. The left ventricle has no regional wall motion  abnormalities. There is moderate left ventricular hypertrophy. Left ventricular diastolic function  could not be evaluated.  2. Right ventricular systolic function is low normal. The right ventricular size is moderately enlarged. There is moderately elevated pulmonary artery systolic pressure. The estimated right ventricular systolic pressure is 29.1 mmHg.  3. Left atrial size was mildly dilated.  4. Right atrial  size was severely dilated.  5. The mitral valve is abnormal. Trivial mitral valve regurgitation.  6. The aortic valve was not well visualized. Aortic valve regurgitation is not visualized. No aortic stenosis is present.  7. Aortic dilatation noted. There is borderline dilatation of the aortic root, measuring 39 mm.  8. The inferior vena cava is dilated in size with <50% respiratory variability, suggesting right atrial pressure of 15 mmHg. Comparison(s): Changes from prior study are noted. 07/23/2019: LVEF 55-60%, RVSP 36 mmHg. FINDINGS  Left Ventricle: Left ventricular ejection fraction, by estimation, is 60 to 65%. The left ventricle has normal function. The left ventricle has no regional wall motion abnormalities. Definity contrast agent was given IV to delineate the left ventricular  endocardial borders. The left ventricular internal cavity size was normal in size. There is moderate left ventricular hypertrophy. Left ventricular diastolic function could not be evaluated due to atrial fibrillation. Left ventricular diastolic function  could not be evaluated. Right Ventricle: The right ventricular size is moderately enlarged. No increase in right ventricular wall thickness. Right ventricular systolic function is low normal. There is moderately elevated pulmonary artery systolic pressure. The tricuspid regurgitant velocity is 3.21 m/s, and with an assumed right atrial pressure of 15 mmHg, the estimated right ventricular systolic pressure is 91.6 mmHg. Left Atrium: Left atrial size was mildly dilated. Right Atrium: Right atrial size was severely dilated. Pericardium: There is no evidence of pericardial effusion. Mitral Valve: The mitral valve is abnormal. Mild to moderate mitral annular calcification. Trivial mitral valve regurgitation. Tricuspid Valve: The tricuspid valve is grossly normal. Tricuspid valve regurgitation is mild. Aortic Valve: The aortic valve was not well visualized. Aortic valve regurgitation is not  visualized. No aortic stenosis is present. Pulmonic Valve: The pulmonic valve was grossly normal. Pulmonic valve regurgitation is not visualized. Aorta: Aortic dilatation noted. There is borderline dilatation

## 2021-05-29 ENCOUNTER — Inpatient Hospital Stay (HOSPITAL_COMMUNITY): Payer: Medicare Other

## 2021-05-29 DIAGNOSIS — I5033 Acute on chronic diastolic (congestive) heart failure: Secondary | ICD-10-CM | POA: Diagnosis not present

## 2021-05-29 DIAGNOSIS — J441 Chronic obstructive pulmonary disease with (acute) exacerbation: Secondary | ICD-10-CM | POA: Diagnosis not present

## 2021-05-29 DIAGNOSIS — I482 Chronic atrial fibrillation, unspecified: Secondary | ICD-10-CM | POA: Diagnosis not present

## 2021-05-29 DIAGNOSIS — I10 Essential (primary) hypertension: Secondary | ICD-10-CM | POA: Diagnosis not present

## 2021-05-29 LAB — CBC
HCT: 31.6 % — ABNORMAL LOW (ref 39.0–52.0)
Hemoglobin: 10.1 g/dL — ABNORMAL LOW (ref 13.0–17.0)
MCH: 34.2 pg — ABNORMAL HIGH (ref 26.0–34.0)
MCHC: 32 g/dL (ref 30.0–36.0)
MCV: 107.1 fL — ABNORMAL HIGH (ref 80.0–100.0)
Platelets: 129 10*3/uL — ABNORMAL LOW (ref 150–400)
RBC: 2.95 MIL/uL — ABNORMAL LOW (ref 4.22–5.81)
RDW: 15 % (ref 11.5–15.5)
WBC: 4.4 10*3/uL (ref 4.0–10.5)
nRBC: 0 % (ref 0.0–0.2)

## 2021-05-29 LAB — BASIC METABOLIC PANEL
Anion gap: 7 (ref 5–15)
BUN: 37 mg/dL — ABNORMAL HIGH (ref 8–23)
CO2: 38 mmol/L — ABNORMAL HIGH (ref 22–32)
Calcium: 8.6 mg/dL — ABNORMAL LOW (ref 8.9–10.3)
Chloride: 91 mmol/L — ABNORMAL LOW (ref 98–111)
Creatinine, Ser: 1.25 mg/dL — ABNORMAL HIGH (ref 0.61–1.24)
GFR, Estimated: 59 mL/min — ABNORMAL LOW (ref >60)
Glucose, Bld: 101 mg/dL — ABNORMAL HIGH (ref 70–99)
Potassium: 4.1 mmol/L (ref 3.5–5.1)
Sodium: 136 mmol/L (ref 135–145)

## 2021-05-29 LAB — BLOOD GAS, ARTERIAL
Acid-Base Excess: 18.6 mmol/L — ABNORMAL HIGH (ref 0.0–2.0)
Acid-Base Excess: 18.3 mmol/L — ABNORMAL HIGH (ref 0.0–2.0)
Bicarbonate: 44.3 mmol/L — ABNORMAL HIGH (ref 20.0–28.0)
Bicarbonate: 44.1 mmol/L — ABNORMAL HIGH (ref 20.0–28.0)
Drawn by: 441371
Drawn by: 252031
O2 Saturation: 87.5 %
O2 Saturation: 99.2 %
Patient temperature: 37
Patient temperature: 37
pCO2 arterial: 53 mmHg — ABNORMAL HIGH (ref 32–48)
pCO2 arterial: 54 mmHg — ABNORMAL HIGH (ref 32–48)
pH, Arterial: 7.53 — ABNORMAL HIGH (ref 7.35–7.45)
pH, Arterial: 7.52 — ABNORMAL HIGH (ref 7.35–7.45)
pO2, Arterial: 55 mmHg — ABNORMAL LOW (ref 83–108)
pO2, Arterial: 116 mmHg — ABNORMAL HIGH (ref 83–108)

## 2021-05-29 LAB — RESP PANEL BY RT-PCR (FLU A&B, COVID) ARPGX2
Influenza A by PCR: NEGATIVE
Influenza B by PCR: NEGATIVE
SARS Coronavirus 2 by RT PCR: NEGATIVE

## 2021-05-29 LAB — GLUCOSE, CAPILLARY: Glucose-Capillary: 142 mg/dL — ABNORMAL HIGH (ref 70–99)

## 2021-05-29 LAB — PROTIME-INR
INR: 2 — ABNORMAL HIGH (ref 0.8–1.2)
Prothrombin Time: 22 seconds — ABNORMAL HIGH (ref 11.4–15.2)

## 2021-05-29 LAB — MAGNESIUM: Magnesium: 1.6 mg/dL — ABNORMAL LOW (ref 1.7–2.4)

## 2021-05-29 MED ORDER — SODIUM CHLORIDE 0.9 % IV SOLN
500.0000 mg | Freq: Every day | INTRAVENOUS | Status: DC
  Administered 2021-05-30: 500 mg via INTRAVENOUS
  Filled 2021-05-29: qty 5

## 2021-05-29 MED ORDER — SODIUM CHLORIDE 0.9 % IV SOLN
2.0000 g | Freq: Every day | INTRAVENOUS | Status: DC
  Administered 2021-05-30: 2 g via INTRAVENOUS
  Filled 2021-05-29: qty 20

## 2021-05-29 MED ORDER — EMPAGLIFLOZIN 10 MG PO TABS
10.0000 mg | ORAL_TABLET | Freq: Every day | ORAL | Status: DC
  Administered 2021-05-29 – 2021-05-30 (×2): 10 mg via ORAL
  Filled 2021-05-29 (×2): qty 1

## 2021-05-29 MED ORDER — MAGNESIUM SULFATE 2 GM/50ML IV SOLN
2.0000 g | Freq: Once | INTRAVENOUS | Status: AC
  Administered 2021-05-29: 2 g via INTRAVENOUS
  Filled 2021-05-29: qty 50

## 2021-05-29 MED ORDER — GUAIFENESIN-DM 100-10 MG/5ML PO SYRP
5.0000 mL | ORAL_SOLUTION | ORAL | Status: DC | PRN
  Administered 2021-05-29: 5 mL via ORAL
  Filled 2021-05-29: qty 5

## 2021-05-29 MED ORDER — WARFARIN SODIUM 7.5 MG PO TABS
7.5000 mg | ORAL_TABLET | Freq: Once | ORAL | Status: AC
  Administered 2021-05-29: 7.5 mg via ORAL
  Filled 2021-05-29: qty 1

## 2021-05-29 MED ORDER — HALOPERIDOL LACTATE 5 MG/ML IJ SOLN
2.5000 mg | Freq: Once | INTRAMUSCULAR | Status: AC
  Administered 2021-05-29: 2.5 mg via INTRAVENOUS
  Filled 2021-05-29: qty 1

## 2021-05-29 MED ORDER — TRAMADOL HCL 50 MG PO TABS
50.0000 mg | ORAL_TABLET | Freq: Four times a day (QID) | ORAL | Status: DC | PRN
  Administered 2021-05-29 – 2021-06-02 (×5): 50 mg via ORAL
  Filled 2021-05-29 (×5): qty 1

## 2021-05-29 NOTE — Plan of Care (Signed)
°  Problem: Education: °Goal: Ability to demonstrate management of disease process will improve °Outcome: Progressing °Goal: Ability to verbalize understanding of medication therapies will improve °Outcome: Progressing °Goal: Individualized Educational Video(s) °Outcome: Progressing °  °

## 2021-05-29 NOTE — Progress Notes (Signed)
?Progress Note ? ? ?Patient: Jake [name] MPN:[id number] DOB: 30-Dec-1945 DOA: 05/25/2021     4 ?DOS: the patient was seen and examined on 05/29/2021 ?  ?Brief hospital course: ?Jake [name] was admitted to the hospital with the working diagnosis of decompensated heart failure.  ? ?76 yo male with the past medical history of heart failure, COPD, hypertension, atrial fibrillation and asthma who presented with dyspnea and lower extremity edema. Reported worsening lower extremity edema for the last 6 weeks, he has gain 20 over last 2 weeks. Symptoms associated with dyspnea and 2 pillow orthopnea. On the day of admission home health nurse advice to come to the ED due worsening symptoms. On his initial physical examination his blood pressure is 101/57, HR 88, RR 16 and 02 saturation 92%, lungs with decreased breath sounds bilaterally, positive rales and wheezing, increased work of breathing, heart with S1 and S2 present and rhythmic, abdomen not distended and positive lower extremity edema ++.  ? ?Na 137, K 4,1 Cl 98, bicarbonate 32, glucose 162, bun 27, cr 1,2 ?BNP 570 ?Troponin 29-29  ?Wbc 6.3 hgb 10,0 hct 32, plt 91  ?INR 2,0  ? ?Chest radiograph with no cardiomegaly, bilateral interstitial infiltrates, small right pleural effusion.  ? ?EKG with 87 bpm, left axis deviation, normal intervals, atrial fibrillation with PVC, q wave V1 and V2 with no significant ST segment or T wave changes.  ? ?Patient was placed on IV furosemide for diuresis with good toleration. ?Had rapid ventricular response to atrial fibrillation and his AV blockade was adjusted with increase dose of diltiazem and metoprolol.  ? ?Assessment and Plan: ?* Acute on chronic diastolic CHF (congestive heart failure) ([place]) ?Echocardiogram with LV systolic function preserved EF 60 to 00%, RV systolic function preserved. Right atrial severe dilatation. (poor acoustic windows)  ? ?04/07 chest radiograph with bilateral interstitial infiltrates with hilar vascular  congestion, positive hyperinflation. (personally reviewed).  ? ?Urine output over last 24 hrs is 2,859 ml  ?Blood pressure systolic is 95 to 867 mmHg.  ? ?Continue heart failure management with digoxin, metoprolol, empagliflozin and spironolactone.  ?Continue blood pressure monitoring.  ?Diuresis with furosemide.  ? ? ?COPD with acute exacerbation ([place]) ?Continue bronchodilator therapy  ? ?Continue oxymetry monitoring and supplemental 02 per [place] to keep 02 saturation 92% or greater.  ? ?Continue with bronchodilator therapy and  inhaled corticosteroids.  ?Holding on systemic steroids for now.  ?Patient is on supplemental home 02  ? ?A-fib ([place]) ?Home medications include metoprolol XL 50 mg and diltiazem 180 mg. ? ?Improved rate control, continue atrial fibrillation rhythm. ?Continue rate control with diltiazem to 240 mg and metoprolol to 50 mg po bid tartrate.  ?Continue anticoagulation with warfarin (INR is 2.0) ?Continue close telemetry monitoring.  ? ?CKD (chronic kidney disease) stage 2, GFR 60-89 ml/min ?Stable renal function with serum cr at 1,25 with K at 4,1 and serum bicarbonate at 38, ?Mag is 1,6. ? ?Plan to add 2 g mag sulfate ?Continue diuresis with furosemide, to consider transition to po.  ?Follow up renal function in am, avoid hypotension or nephrotoxic medications.  ? ?Essential hypertension ?Continue blood pressure monitoring ?On metoprolol, diltiazem, and spironolactone.  Diuresis with furosemide.  ? ?Rheumatoid arthritis ([place]) ?No active flare.  ?Follow up as outpatient.  ? ?Peripheral polyneuropathy ?Stable, continue medical therapy with gabapenitn.  ? ?Anemia ?Thrombocytopenia ?Likely anemia of chronic disease.  ? ? ?Class 1 obesity ?Calculated BMI is 31.5 ? ? ? ? ? ?  ? ?Subjective: patient  is feeling better, dyspnea and edema have improved.  ? ?Physical Exam: ?Vitals:  ? 05/29/21 0400 05/29/21 0729 05/29/21 0741 05/29/21 0850  ?BP: 101/62  109/64 118/63  ?Pulse: 95 85 (!) 104   ?Resp: (!) 22 16  19    ?Temp: 98.4 ?F (36.9 ?C)  98.4 ?F (36.9 ?C)   ?TempSrc: Oral  Oral   ?SpO2: 94% 91% 94%   ?Weight:      ?Height:      ? ?Neurology awake and alert ?ENT with mild pallor ?Cardiovascular with S1 and S2 present irregularly irregular with no gallops rubs or murmurs ?No JVD ?Trace lower extremity edema ?Respiratory with no wheezing ?Abdomen protuberant but not distended  ?Data Reviewed: ? ? ? ?Family Communication: I spoke over the phone with the patient's daughter about patient's  condition, plan of care, prognosis and all questions were addressed.  ? ?Disposition: ?Status is: Inpatient ?Remains inpatient appropriate because: atrial fibrillation ? Planned Discharge Destination: Home ? ? ? ?Author: ?Tawni Millers, MD ?05/29/2021 11:30 AM ? ?For on call review [email or web address].  ?

## 2021-05-29 NOTE — Progress Notes (Signed)
Rapid response called due to drop in oxygen saturations into 70s-80s. Patient oxygen requirements increased from 8L high flow to 15L.  Patient having new onset of tremors and redness on face and body.  MD notified and awaiting new orders. ?

## 2021-05-29 NOTE — Progress Notes (Addendum)
MD notified of increase in O2 requirements from 3L to 5L. 90% on 5L [place]. Pt reporting no sob, resting comfortably. PRN Neb given. ?

## 2021-05-29 NOTE — Progress Notes (Signed)
I met Jake [name] at the bedside with the previous call. Jake [name]' minute ventilation was 28L with sats 99%. RR 30, BIPAP 8/5 with Tv 800+. He is very anxious. Awaiting further orders per primary RN.  ? ?Addendum- 2000 call received stating Jake [name] would not leave his mask on.  I came to bedside and placed Jake [name] back on NRB mask at 15L and he is more comfortable. He stated the mask (BIPAP) was too much. RR 28 with sats 99%.  ?

## 2021-05-29 NOTE — Progress Notes (Signed)
MD notified of patient desatting in 64s.Patient oxygen requirements increased from 5L to 8L high flow nasal cannula. Orders received for ABG. ?

## 2021-05-29 NOTE — Progress Notes (Signed)
Patient refuses to leave bipap on and with 100% NRB Sp02 levels are 85-88%. Placed patient on heated high flow at 30lpm and 100% with Sp02=89-90%. NRB added with Sp02=92% ? ?

## 2021-05-29 NOTE — Progress Notes (Addendum)
?Cardiology Progress Note  ?Patient ID: Jake [name] ?MRN: [id number] ?DOB: Jun 26, 1945 ?Date of Encounter: 05/29/2021 ? ?Primary Cardiologist: Elouise Munroe, MD ? ?Subjective  ? ?Chief Complaint: SOB ? ?HPI: Good urine output.  Chest x-ray still with pulmonary edema.  Net -14 L.  Not back to baseline. ? ?ROS:  ?All other ROS reviewed and negative. Pertinent positives noted in the HPI.    ? ?Inpatient Medications  ?Scheduled Meds: ? budesonide (PULMICORT) nebulizer solution  0.25 mg Nebulization BID  ? digoxin  0.125 mg Oral Daily  ? diltiazem  240 mg Oral Daily  ? empagliflozin  10 mg Oral Daily  ? folic acid  1 mg Oral Daily  ? furosemide  40 mg Intravenous BID  ? gabapentin  300 mg Oral BID  ? magnesium oxide  400 mg Oral Daily  ? metoprolol tartrate  50 mg Oral BID  ? spironolactone  12.5 mg Oral Daily  ? sulfaSALAzine  500 mg Oral Daily  ? cyanocobalamin  1,000 mcg Oral Daily  ? Warfarin - Pharmacist Dosing Inpatient   Does not apply V9563  ? ?Continuous Infusions: ? ?PRN Meds: ?acetaminophen **OR** acetaminophen, albuterol, magnesium hydroxide, ondansetron **OR** ondansetron (ZOFRAN) IV, traZODone  ? ?Vital Signs  ? ?Vitals:  ? 05/29/21 0300 05/29/21 0400 05/29/21 0729 05/29/21 0741  ?BP:  101/62  109/64  ?Pulse:  95 85 (!) 104  ?Resp:  (!) 22 16 19   ?Temp:  98.4 ?F (36.9 ?C)  98.4 ?F (36.9 ?C)  ?TempSrc:  Oral  Oral  ?SpO2:  94% 91% 94%  ?Weight: 100.5 kg     ?Height:      ? ? ?Intake/Output Summary (Last 24 hours) at 05/29/2021 0811 ?Last data filed at 05/29/2021 8756 ?Gross per 24 hour  ?Intake 720 ml  ?Output 3750 ml  ?Net -3030 ml  ? ? ?  05/29/2021  ?  3:00 AM 05/28/2021  ?  5:00 AM 05/27/2021  ?  3:15 AM  ?Last 3 Weights  ?Weight (lbs) 221 lb 9 oz 225 lb 8.5 oz 238 lb 14.4 oz  ?Weight (kg) 100.5 kg 102.3 kg 108.364 kg  ?   ? ?Telemetry  ?Overnight telemetry shows A-fib heart rate 90-100 bpm, which I personally reviewed.  ? ?Physical Exam  ? ?Vitals:  ? 05/29/21 0300 05/29/21 0400 05/29/21 0729 05/29/21 0741   ?BP:  101/62  109/64  ?Pulse:  95 85 (!) 104  ?Resp:  (!) 22 16 19   ?Temp:  98.4 ?F (36.9 ?C)  98.4 ?F (36.9 ?C)  ?TempSrc:  Oral  Oral  ?SpO2:  94% 91% 94%  ?Weight: 100.5 kg     ?Height:      ?  ?Intake/Output Summary (Last 24 hours) at 05/29/2021 0811 ?Last data filed at 05/29/2021 4332 ?Gross per 24 hour  ?Intake 720 ml  ?Output 3750 ml  ?Net -3030 ml  ?  ? ?  05/29/2021  ?  3:00 AM 05/28/2021  ?  5:00 AM 05/27/2021  ?  3:15 AM  ?Last 3 Weights  ?Weight (lbs) 221 lb 9 oz 225 lb 8.5 oz 238 lb 14.4 oz  ?Weight (kg) 100.5 kg 102.3 kg 108.364 kg  ?  Body mass index is 29.23 kg/m?.  ? ?General: Well nourished, well developed, in no acute distress ?Head: Atraumatic, normal size  ?Eyes: PEERLA, EOMI  ?Neck: Supple, no JVD ?Endocrine: No thryomegaly ?Cardiac: Normal S1, S2; irregular rhythm, no murmurs ?Lungs: Diffuse rales and rhonchi ?Abd: Soft, nontender, no hepatomegaly  ?  Ext: Trace edema ?Musculoskeletal: No deformities, BUE and BLE strength normal and equal ?Skin: Warm and dry, no rashes   ?Neuro: Alert and oriented to person, place, time, and situation, CNII-XII grossly intact, no focal deficits  ?Psych: Normal mood and affect  ? ?Labs  ?High Sensitivity Troponin:   ?Recent Labs  ?Lab 05/25/21 ?1721 05/25/21 ?1910  ?TROPONINIHS 29* 29*  ?   ?Cardiac EnzymesNo results for input(s): TROPONINI in the last 168 hours. No results for input(s): TROPIPOC in the last 168 hours.  ?Chemistry ?Recent Labs  ?Lab 05/25/21 ?1721 05/26/21 ?0110 05/27/21 ?1194 05/28/21 ?1740 05/29/21 ?0053  ?NA 137   < > 139 138 136  ?K 4.1   < > 4.2 3.8 4.1  ?CL 98   < > 96* 93* 91*  ?CO2 32   < > 35* 35* 38*  ?GLUCOSE 162*   < > 115* 95 101*  ?BUN 27*   < > 28* 35* 37*  ?CREATININE 1.22   < > 1.16 1.25* 1.25*  ?CALCIUM 8.2*   < > 8.6* 8.7* 8.6*  ?PROT 5.8*  --   --   --   --   ?ALBUMIN 3.2*  --   --   --   --   ?AST 19  --   --   --   --   ?ALT 20  --   --   --   --   ?ALKPHOS 42  --   --   --   --   ?BILITOT 1.0  --   --   --   --   ?GFRNONAA >60   < >  >60 59* 59*  ?ANIONGAP 7   < > 8 10 7   ? < > = values in this interval not displayed.  ?  ?Hematology ?Recent Labs  ?Lab 05/25/21 ?1721 05/26/21 ?0110 05/29/21 ?8144  ?WBC 6.3 6.8 4.4  ?RBC 2.90* 2.76* 2.95*  ?HGB 10.0* 9.5* 10.1*  ?HCT 32.0* 30.5* 31.6*  ?MCV 110.3* 110.5* 107.1*  ?MCH 34.5* 34.4* 34.2*  ?MCHC 31.3 31.1 32.0  ?RDW 15.8* 15.7* 15.0  ?PLT 91* 94* 129*  ? ?BNP ?Recent Labs  ?Lab 05/25/21 ?1721  ?BNP 570.9*  ?  ?DDimer No results for input(s): DDIMER in the last 168 hours.  ? ?Radiology  ?DG CHEST PORT 1 VIEW ? ?Result Date: 05/28/2021 ?CLINICAL DATA:  Shortness of breath EXAM: PORTABLE CHEST 1 VIEW COMPARISON:  05/25/2021 FINDINGS: Cardiomegaly. Unchanged diffuse bilateral interstitial pulmonary opacity. The visualized skeletal structures are unremarkable. IMPRESSION: Cardiomegaly with unchanged diffuse bilateral interstitial pulmonary opacity, likely edema. No new or focal airspace opacity. Electronically Signed   By: Delanna Ahmadi M.D.   On: 05/28/2021 10:58   ? ?Cardiac Studies  ?TTE 05/26/2021 ? 1. Left ventricular ejection fraction, by estimation, is 60 to 65%. The  ?left ventricle has normal function. The left ventricle has no regional  ?wall motion abnormalities. There is moderate left ventricular hypertrophy.  ?Left ventricular diastolic function  ? could not be evaluated.  ? 2. Right ventricular systolic function is low normal. The right  ?ventricular size is moderately enlarged. There is moderately elevated  ?pulmonary artery systolic pressure. The estimated right ventricular  ?systolic pressure is 81.8 mmHg.  ? 3. Left atrial size was mildly dilated.  ? 4. Right atrial size was severely dilated.  ? 5. The mitral valve is abnormal. Trivial mitral valve regurgitation.  ? 6. The aortic valve was not well visualized. Aortic valve regurgitation  ?is  not visualized. No aortic stenosis is present.  ? 7. Aortic dilatation noted. There is borderline dilatation of the aortic  ?root, measuring 39 mm.  ?  8. The inferior vena cava is dilated in size with <50% respiratory  ?variability, suggesting right atrial pressure of 15 mmHg.  ? ?Patient Profile  ?Jake [name] is a 76 y.o. male with COPD, permanent atrial fibrillation, ongoing tobacco abuse, HFpEF, hypertension who was admitted on 05/25/2021 with acute on chronic diastolic heart failure and A-fib with RVR.  Also here with COPD exacerbation. ? ?Assessment & Plan  ? ?#Acute on chronic diastolic heart failure ?-Chest x-ray still pulmonary edema.  Lung exam still consistent with likely mixed picture.  Would treat his lungs more aggressively. ?-Continue with Lasix 40 mg IV twice daily. ?-On Aldactone 12.5 mg daily. ?-I have added Jardiance 10 mg daily. ?-Hopefully can transition to oral diuretics tomorrow. ?-He is net -14 L since admission.  Weight is down 10 kg.  This is 22 pounds. ? ?#Permanent A-fib ?-Rates are improved.  Continue metoprolol tartrate 50 mg twice daily.  Continue diltiazem extended release to 40 mg daily.  On digoxin 0.125 mg daily. ?-Continue to treat COPD as this will help. ?-on coumadin ? ?#Acute on chronic COPD ?-Per hospital medicine. ? ?For questions or updates, please contact Ali [place] ?Please consult [email or web address] for contact info under  ? ?Signed, ?[place] T. Audie Box, MD, [facility] ?[place]  ?05/29/2021 8:11 AM  ? ?

## 2021-05-29 NOTE — Progress Notes (Signed)
RT placed patient on Bipap, 8/5, 100%, RR 16.  RRRN at bedside. Patient tolerating well at this time.  ?

## 2021-05-29 NOTE — Progress Notes (Signed)
ANTICOAGULATION CONSULT NOTE  ? ?Pharmacy Consult for Warfarin  ?Indication: atrial fibrillation ? ?Allergies  ?Allergen Reactions  ? Tape Other (See Comments)  ?  Patient takes Coumadin!! Tape BRUISES and TEARS THE SKIN  ? Propoxyphene Nausea And Vomiting  ? ? ?Patient Measurements: ?Height: 6\' 1"  (185.4 cm) ?Weight: 100.5 kg (221 lb 9 oz) ?IBW/kg (Calculated) : 79.9 ?Vital Signs: ?Temp: 98.7 ?F (37.1 ?C) (04/09 1130) ?Temp Source: Oral (04/09 1130) ?BP: 97/65 (04/09 1130) ?Pulse Rate: 98 (04/09 1130) ? ?Labs: ?Recent Labs  ?  05/27/21 ?0240 05/28/21 ?9735 05/29/21 ?0053  ?HGB  --   --  10.1*  ?HCT  --   --  31.6*  ?PLT  --   --  129*  ?LABPROT 26.1* 23.8* 22.0*  ?INR 2.4* 2.2* 2.0*  ?CREATININE 1.16 1.25* 1.25*  ? ? ? ?Estimated Creatinine Clearance: 61.7 mL/min (A) (by C-G formula based on SCr of 1.25 mg/dL (H)). ? ? ?Medical History: ?Past Medical History:  ?Diagnosis Date  ? Anemia   ? Asthma   ? Chronic diastolic (congestive) heart failure (HCC)   ? Chronic respiratory failure ([place])   ? CKD (chronic kidney disease) stage 2, GFR 60-89 ml/min   ? COPD (chronic obstructive pulmonary disease) ([place])   ? Cor pulmonale (HCC)   ? Coronary artery calcification   ? Coronary artery disease   ? Hepatitis A   ? Hypertension   ? Lung cancer ([place])   ? OSA (obstructive sleep apnea)   ? Permanent atrial fibrillation ([place])   ? Pulmonary hypertension ([place])   ? Rheumatoid arthritis ([place])   ? Thrombocytopenia ([place])   ? ? ? ?Assessment: ?76 y/o M with heart failure exacerbation. On warfarin PTA for afib. Pharmacy consulted for inpatient warfarin dosing.  ? ?Home warfarin regimen (per recent discharge from [facility]): warfarin 7.5 mg Mon/Wed/Fri/Sun and 5 mg all other days ? ?4/9 INR 2.0, therapeutic ?No overt bleeding, CBC stable. 100% of meal documented as eaten. ? ?Goal of Therapy:  ?INR 2-3 ?Monitor platelets by anticoagulation protocol: Yes ?  ?Plan:  ?Warfarin 7.5 mg PO x 1 tonight (c/w home dose) ?Daily  PT/INR ?Monitor for bleeding ? ?Thank you for allowing pharmacy to participate in this patient's care. ? ?Levonne Spiller, PharmD ?PGY1 Acute Care Resident  ?05/29/2021,1:55 PM ? ? ? ? ? ?

## 2021-05-29 NOTE — Progress Notes (Addendum)
HOSPITAL MEDICINE OVERNIGHT EVENT NOTE   ? ?Nursing reports that patient has been quite agitated on BiPAP and is unwilling to wear the BiPAP mask.  Patient is been transitioned from BiPAP to nonrebreather mask by respiratory therapy however patient continues to be quite agitated and intermittently confused. ? ?Recent ABG reviewed revealing no evidence of hypercapnia as the culprit.  Keeping patient on supplemental oxygen, likely via high flow oxygen delivery with titration of oxygen therapy to keep an oxygen saturation of between 89 to 92% is appropriate. ? ?We will obtain an EKG to reassess QTc and administer Haldol in an effort to improve agitation and compliance with medical therapy. ? ?Continuing to monitor closely. ? ?Vernelle Emerald  MD ?[facility]  ? ? ?ADDENDUM (9pm 4/9) ? ?Nursing reports that patient's agitation status post Haldol administration is substantially improved.  Patient is compliant with oxygen supplementation.  Continuing to monitor closely. ? ?Sherryll Burger Korayma Hagwood ? ?ADDENDUM (11:30PM 4/9) ? ?Notified by nursing that patient has now become febrile with fever of 102.2 ?F.  Patient is becoming increasingly hypotensive with systolic blood pressures now in the 80s. ? ?Patient evaluated at the bedside.  Patient is lethargic but arousable and oriented x2.  Patient does not appear to be in respiratory distress.  Lung exam does reveal bibasilar rales but no evidence of wheezing.  Heart sounds are regular.  No evidence of jugular venous distention and no evidence of peripheral edema. ? ?Clinically, patient appears to be somewhat dehydrated at this point.  Patient is now hypotensive and febrile in the setting of worsening bibasilar infiltrates on chest x-ray performed this evening. ? ?Concern for developing pneumonia and therefore obtaining blood cultures, CBC, procalcitonin, CRP, lactic acid.  Initiating intravenous ceftriaxone and azithromycin.  Patient has been transitioned to progressive  status. ? ?Finally, giving small 250 cc boluses to attempt to improve blood pressures to maintain maps above 65 and avoid creating recurrent volume overload.  Continuing to provide supplemental oxygen, now via high flow oxygen delivery to maintain oxygen saturations of between 89 and 92% considering patient's history of COPD. ? ?Continue to monitor patient closely. ? ?Sherryll Burger Latronda Spink ? ? ? ? ? ? ? ? ? ?

## 2021-05-29 NOTE — Significant Event (Signed)
Rapid Response Event Note  ? ?Reason for Call :  ?Acute oxygen desaturation - 82% on 15L HFNC ? ?Initial Focused Assessment:  ?Pt lying in bed, AO. He is noted to be shivering, he is red in the face. Skin is warm, dry. Lips are pale. Lung sounds are rhonchus, L>R. Expiratory wheeze noted to R lung. Pt with persistent congested cough, and nasal clearing. He denies post-nasal drainage.  ? ?Pt denies pain or trouble breathing. However, he has tachypnea and is labored. Unable to speak in full sentences.  ? ?VS: T 98.66F, BP 115/60, HR 101, RR 24, 93%  15L HFNC ?CBG: 142 ? ?Interventions:  ?-ABG: 7.53/ 53/ 55/ 44.3 ?-BIPAP 8/5 100% rate 16  ?-CXR ?-PRN Robutussin ordered per "General admission PRN" order set ? ?Plan of Care:  ?-Consider Covid testing ?-May need PRN for anxiety and/or RR ?-Oral suction at [facility], maintain HOB >30 degrees, NPO while requiring BiPAP, oral care per protocol ? ?Call rapid response for additional needs ? ?Event Summary:  ?MD Notified: Dr. Cathlean Sauer, Dr. Marlyce Huge ?Call Time: [id number] ?Arrival Time: 4144 ?End Time: 1915 ? ?Casimer Bilis, RN ?

## 2021-05-30 ENCOUNTER — Inpatient Hospital Stay (HOSPITAL_COMMUNITY): Payer: Medicare Other

## 2021-05-30 ENCOUNTER — Other Ambulatory Visit (HOSPITAL_COMMUNITY): Payer: Self-pay

## 2021-05-30 DIAGNOSIS — J690 Pneumonitis due to inhalation of food and vomit: Secondary | ICD-10-CM | POA: Diagnosis not present

## 2021-05-30 DIAGNOSIS — I482 Chronic atrial fibrillation, unspecified: Secondary | ICD-10-CM | POA: Diagnosis not present

## 2021-05-30 DIAGNOSIS — I5033 Acute on chronic diastolic (congestive) heart failure: Secondary | ICD-10-CM | POA: Diagnosis not present

## 2021-05-30 DIAGNOSIS — N182 Chronic kidney disease, stage 2 (mild): Secondary | ICD-10-CM | POA: Diagnosis not present

## 2021-05-30 LAB — CBC WITH DIFFERENTIAL/PLATELET
Abs Immature Granulocytes: 0 10*3/uL (ref 0.00–0.07)
Basophils Absolute: 0 10*3/uL (ref 0.0–0.1)
Basophils Relative: 0 %
Eosinophils Absolute: 0 10*3/uL (ref 0.0–0.5)
Eosinophils Relative: 0 %
HCT: 33.4 % — ABNORMAL LOW (ref 39.0–52.0)
Hemoglobin: 11 g/dL — ABNORMAL LOW (ref 13.0–17.0)
Lymphocytes Relative: 4 %
Lymphs Abs: 0.4 10*3/uL — ABNORMAL LOW (ref 0.7–4.0)
MCH: 35 pg — ABNORMAL HIGH (ref 26.0–34.0)
MCHC: 32.9 g/dL (ref 30.0–36.0)
MCV: 106.4 fL — ABNORMAL HIGH (ref 80.0–100.0)
Monocytes Absolute: 0.5 10*3/uL (ref 0.1–1.0)
Monocytes Relative: 5 %
Neutro Abs: 8.6 10*3/uL — ABNORMAL HIGH (ref 1.7–7.7)
Neutrophils Relative %: 91 %
Platelets: 128 10*3/uL — ABNORMAL LOW (ref 150–400)
RBC: 3.14 MIL/uL — ABNORMAL LOW (ref 4.22–5.81)
RDW: 15 % (ref 11.5–15.5)
WBC: 9.5 10*3/uL (ref 4.0–10.5)
nRBC: 0 % (ref 0.0–0.2)
nRBC: 0 /100 WBC

## 2021-05-30 LAB — BASIC METABOLIC PANEL
Anion gap: 12 (ref 5–15)
Anion gap: 9 (ref 5–15)
BUN: 43 mg/dL — ABNORMAL HIGH (ref 8–23)
BUN: 44 mg/dL — ABNORMAL HIGH (ref 8–23)
CO2: 35 mmol/L — ABNORMAL HIGH (ref 22–32)
CO2: 36 mmol/L — ABNORMAL HIGH (ref 22–32)
Calcium: 8.7 mg/dL — ABNORMAL LOW (ref 8.9–10.3)
Calcium: 8.3 mg/dL — ABNORMAL LOW (ref 8.9–10.3)
Chloride: 89 mmol/L — ABNORMAL LOW (ref 98–111)
Chloride: 92 mmol/L — ABNORMAL LOW (ref 98–111)
Creatinine, Ser: 1.99 mg/dL — ABNORMAL HIGH (ref 0.61–1.24)
Creatinine, Ser: 1.83 mg/dL — ABNORMAL HIGH (ref 0.61–1.24)
GFR, Estimated: 34 mL/min — ABNORMAL LOW (ref >60)
GFR, Estimated: 38 mL/min — ABNORMAL LOW (ref >60)
Glucose, Bld: 111 mg/dL — ABNORMAL HIGH (ref 70–99)
Glucose, Bld: 131 mg/dL — ABNORMAL HIGH (ref 70–99)
Potassium: 4.7 mmol/L (ref 3.5–5.1)
Potassium: 5 mmol/L (ref 3.5–5.1)
Sodium: 136 mmol/L (ref 135–145)
Sodium: 137 mmol/L (ref 135–145)

## 2021-05-30 LAB — TROPONIN I (HIGH SENSITIVITY): Troponin I (High Sensitivity): 110 ng/L (ref <18)

## 2021-05-30 LAB — BRAIN NATRIURETIC PEPTIDE: B Natriuretic Peptide: 570.3 pg/mL — ABNORMAL HIGH (ref 0.0–100.0)

## 2021-05-30 LAB — LACTIC ACID, PLASMA
Lactic Acid, Venous: 2.2 mmol/L (ref 0.5–1.9)
Lactic Acid, Venous: 1.3 mmol/L (ref 0.5–1.9)

## 2021-05-30 LAB — C-REACTIVE PROTEIN: CRP: 11.5 mg/dL — ABNORMAL HIGH (ref <1.0)

## 2021-05-30 LAB — PROTIME-INR
INR: 2.1 — ABNORMAL HIGH (ref 0.8–1.2)
Prothrombin Time: 23.1 seconds — ABNORMAL HIGH (ref 11.4–15.2)

## 2021-05-30 LAB — D-DIMER, QUANTITATIVE: D-Dimer, Quant: 1.99 ug/mL-FEU — ABNORMAL HIGH (ref 0.00–0.50)

## 2021-05-30 LAB — PROCALCITONIN: Procalcitonin: 3.56 ng/mL

## 2021-05-30 MED ORDER — SODIUM CHLORIDE 0.9 % IV BOLUS
250.0000 mL | Freq: Once | INTRAVENOUS | Status: AC
  Administered 2021-05-30: 250 mL via INTRAVENOUS

## 2021-05-30 MED ORDER — SODIUM CHLORIDE 0.9 % IV SOLN
1.5000 g | Freq: Four times a day (QID) | INTRAVENOUS | Status: DC
  Administered 2021-05-30 – 2021-06-05 (×24): 1.5 g via INTRAVENOUS
  Filled 2021-05-30 (×27): qty 4

## 2021-05-30 MED ORDER — AZITHROMYCIN 500 MG IV SOLR: 500.0000 mg | Freq: Every day | INTRAVENOUS | Status: DC

## 2021-05-30 MED ORDER — WARFARIN SODIUM 7.5 MG PO TABS
7.5000 mg | ORAL_TABLET | Freq: Once | ORAL | Status: AC
  Administered 2021-05-30: 7.5 mg via ORAL
  Filled 2021-05-30: qty 1

## 2021-05-30 NOTE — Progress Notes (Signed)
Inpatient Rehab Admissions Coordinator:  ? ?Per therapy recommendations,  patient was screened for CIR candidacy by Clemens Catholic, MS, CCC-SLP ?  At this time, Pt. is not medically ready for CIR.He is requiring 30L via [place] at ths time.  I will not pursue a rehab consult for this Pt. at this time, but CIR admissions team will follow and monitor for medical readiness and place consult order if Pt. appears to be an appropriate candidate. Please contact me with any questions.  ? ?Clemens Catholic, MS, CCC-SLP ? ? ?

## 2021-05-30 NOTE — Plan of Care (Signed)
  Problem: Education: Goal: Ability to demonstrate management of disease process will improve Outcome: Progressing Goal: Ability to verbalize understanding of medication therapies will improve Outcome: Progressing   

## 2021-05-30 NOTE — Progress Notes (Signed)
ANTICOAGULATION CONSULT NOTE  ? ?Pharmacy Consult for Warfarin  ?Indication: atrial fibrillation ? ?Allergies  ?Allergen Reactions  ? Tape Other (See Comments)  ?  Patient takes Coumadin!! Tape BRUISES and TEARS THE SKIN  ? Propoxyphene Nausea And Vomiting  ? ? ?Patient Measurements: ?Height: 6\' 1"  (185.4 cm) ?Weight: 99.6 kg (219 lb 9.3 oz) ?IBW/kg (Calculated) : 79.9 ?Vital Signs: ?Temp: 98.8 ?F (37.1 ?C) (04/10 1324) ?Temp Source: Oral (04/10 0509) ?BP: 98/62 (04/10 0733) ?Pulse Rate: 95 (04/10 0733) ? ?Labs: ?Recent Labs  ?  05/28/21 ?4010 05/29/21 ?0053 05/30/21 ?0101 05/30/21 ?0541  ?HGB  --  10.1* 11.0*  --   ?HCT  --  31.6* 33.4*  --   ?PLT  --  129* 128*  --   ?LABPROT 23.8* 22.0*  --  23.1*  ?INR 2.2* 2.0*  --  2.1*  ?CREATININE 1.25* 1.25* 1.99* 1.83*  ?TROPONINIHS  --   --  110*  --   ? ? ? ?Estimated Creatinine Clearance: 42 mL/min (A) (by C-G formula based on SCr of 1.83 mg/dL (H)). ? ? ?Medical History: ?Past Medical History:  ?Diagnosis Date  ? Anemia   ? Asthma   ? Chronic diastolic (congestive) heart failure (HCC)   ? Chronic respiratory failure ([place])   ? CKD (chronic kidney disease) stage 2, GFR 60-89 ml/min   ? COPD (chronic obstructive pulmonary disease) ([place])   ? Cor pulmonale (HCC)   ? Coronary artery calcification   ? Coronary artery disease   ? Hepatitis A   ? Hypertension   ? Lung cancer ([place])   ? OSA (obstructive sleep apnea)   ? Permanent atrial fibrillation ([place])   ? Pulmonary hypertension ([place])   ? Rheumatoid arthritis ([place])   ? Thrombocytopenia ([place])   ? ? ? ?Assessment: ?76 y/o M with heart failure exacerbation. On warfarin PTA for afib. Pharmacy consulted for inpatient warfarin dosing.  ? ?Home warfarin regimen (per [facility] discharge from [facility]): warfarin 7.5 mg Mon/Wed/Fri/Sun and 5 mg all other days ? ?4/10 INR 2.1, therapeutic ?No overt bleeding, CBC stable.  ? ?Goal of Therapy:  ?INR 2-3 ?Monitor platelets by anticoagulation protocol: Yes ?  ?Plan:  ?Warfarin  7.5 mg PO x 1 tonight (c/w home dose) ?Daily PT/INR ?Monitor for bleeding ? ?Thank you for allowing pharmacy to participate in this patient's care. ? ?Alanda Slim, PharmD, FCCM ?Clinical Pharmacist ?Please see AMION for all Pharmacists' Contact Phone Numbers ?05/30/2021, 7:53 AM  ? ? ? ? ? ? ?

## 2021-05-30 NOTE — Plan of Care (Signed)
?  Problem: Education: ?Goal: Ability to demonstrate management of disease process will improve ?Outcome: Progressing ?Goal: Ability to verbalize understanding of medication therapies will improve ?Outcome: Progressing ?  ?Problem: Cardiac: ?Goal: Ability to achieve and maintain adequate cardiopulmonary perfusion will improve ?Outcome: Progressing ?  ?Problem: Clinical Measurements: ?Goal: Respiratory complications will improve ?Outcome: Progressing ?Goal: Cardiovascular complication will be avoided ?Outcome: Progressing ?  ?Problem: Coping: ?Goal: Level of anxiety will decrease ?Outcome: Progressing ?  ?Problem: Pain Managment: ?Goal: General experience of comfort will improve ?Outcome: Progressing ?  ?

## 2021-05-30 NOTE — Progress Notes (Signed)
Physical Therapy Treatment ?Patient Details ?Name: Jake [name] ?MRN: [id number] ?DOB: September 09, 1945 ?Today's Date: 05/30/2021 ? ? ?History of Present Illness Pt is a 76 y.o. M who presents 05/25/2021 with working diagnosis of decompensated heart failure. Rapid called 4/9 due to Acute oxygen desaturation requiring BiPAP and then [place]. Significant PMH: heart failure, COPD, HTN, atrial fibrillation, asthma, peripheral polyneuropathy, RA. ? ?  ?PT Comments  ? ? Patient progressing slowly towards PT goals. Reports feeling sleepy this morning due to not sleeping well last night due to SOB. Requires Min A for standing and for taking a few steps to get to the chair. Pt with LOB posteriorly and to the right due to RLE giving out per report. SP02 stayed >84% on 30L HHFNC during session. Sitting BP 95/54 (66), standing BP 86/48 (60), asymptomatic. Discharge recommendation updated to AIR due to change in functional status since last week including worsened weakness, activity tolerance and increased need for 02. Will continue to follow and progress. ?  ?Recommendations for follow up therapy are one component of a multi-disciplinary discharge planning process, led by the attending physician.  Recommendations may be updated based on patient status, additional functional criteria and insurance authorization. ? ?Follow Up Recommendations ? Acute inpatient rehab (3hours/day) ?  ?  ?Assistance Recommended at Discharge PRN  ?Patient can return home with the following A little help with walking and/or transfers;A little help with bathing/dressing/bathroom;Assist for transportation;Assistance with cooking/housework ?  ?Equipment Recommendations ? None recommended by PT  ?  ?Recommendations for Other Services   ? ? ?  ?Precautions / Restrictions Precautions ?Precautions: Fall;Other (comment) ?Precaution Comments: HHFNC, watch BP ?Restrictions ?Weight Bearing Restrictions: No ?Other Position/Activity Restrictions: watch O2 (30 L HFNC) and BP   ?  ? ?Mobility ? Bed Mobility ?Overal bed mobility: Modified Independent ?  ?  ?  ?  ?  ?  ?General bed mobility comments: increased time due to fatigue ?  ? ?Transfers ?Overall transfer level: Needs assistance ?Equipment used: Rolling walker (2 wheels) ?Transfers: Sit to/from Stand, Bed to chair/wheelchair/BSC ?Sit to Stand: Min assist ?  ?Step pivot transfers: Min assist ?  ?  ?  ?General transfer comment: Min A to power to standing with cues for hand placment/technique. Stood from [employer], from chair x1, able to step pivot to chair with Min A- pt with LOB backwards onto chair due to RLE "giving out." Sp02 dropped to 84% in 30L HHFNC with 2/4 DOE. ?  ? ?Ambulation/Gait ?  ?  ?  ?  ?  ?  ?  ?General Gait Details: Deferred due to [place] line. ? ? ?Stairs ?  ?  ?  ?  ?  ? ? ?Wheelchair Mobility ?  ? ?Modified Rankin (Stroke Patients Only) ?  ? ? ?  ?Balance Overall balance assessment: Needs assistance ?Sitting-balance support: Feet supported, No upper extremity supported ?Sitting balance-Leahy Scale: Fair ?  ?  ?Standing balance support: Bilateral upper extremity supported, During functional activity, Reliant on assistive device for balance ?Standing balance-Leahy Scale: Poor ?Standing balance comment: reliant on RW ?  ?  ?  ?  ?  ?  ?  ?  ?  ?  ?  ?  ? ?  ?Cognition Arousal/Alertness: Awake/alert ?Behavior During Therapy: [facility] for tasks assessed/performed ?Overall Cognitive Status: Within Functional Limits for tasks assessed ?  ?  ?  ?  ?  ?  ?  ?  ?  ?  ?  ?  ?  ?  ?  ?  ?  ?  ?  ? ?  ?  Exercises   ? ?  ?General Comments General comments (skin integrity, edema, etc.): SP02 stayed >84% on 30L HHFNC, Sitting BP 95/54 (66), standing BP 86/48 (60) ?  ?  ? ?Pertinent Vitals/Pain Pain Assessment ?Pain Assessment: Faces ?Faces Pain Scale: Hurts a little bit ?Pain Location: right hip ?Pain Descriptors / Indicators: Discomfort, Grimacing ?Pain Intervention(s): Monitored during session, Repositioned  ? ? ?Home Living  Family/patient expects to be discharged to:: Private residence ?Living Arrangements: Spouse/significant other;Other relatives (grandson) ?Available Help at Discharge: Family ?Type of Home: House ?Home Access: Ramped entrance ?  ?  ?  ?Home Layout: One level ?Home Equipment: Cane - single [place] (2 wheels);Wheelchair - manual;BSC/3in1;Shower seat ?   ?  ?Prior Function    ?  ?  ?   ? ?PT Goals (current goals can now be found in the care plan section) Progress towards PT goals: Progressing toward goals (slowly) ? ?  ?Frequency ? ? ? Min 3X/week ? ? ? ?  ?PT Plan Discharge plan needs to be updated  ? ? ?Co-evaluation   ?  ?  ?  ?  ? ?  ?AM-PAC PT "6 Clicks" Mobility   ?Outcome Measure ? Help needed turning from your back to your side while in a flat bed without using bedrails?: None ?Help needed moving from lying on your back to sitting on the side of a flat bed without using bedrails?: None ?Help needed moving to and from a bed to a chair (including a wheelchair)?: A Little ?Help needed standing up from a chair using your arms (e.g., wheelchair or bedside chair)?: A Little ?Help needed to walk in hospital room?: A Little ?Help needed climbing 3-5 steps with a railing? : A Lot ?6 Click Score: 19 ? ?  ?End of Session Equipment Utilized During Treatment: Oxygen;Gait belt ([place]) ?Activity Tolerance: Patient tolerated treatment well;Treatment limited secondary to medical complications (Comment) (drop in Sp02 to 84% on HHFNC) ?Patient left: in chair;with call bell/phone within reach ?Nurse Communication: Mobility status ?PT Visit Diagnosis: Unsteadiness on feet (R26.81);Difficulty in walking, not elsewhere classified (R26.2) ?  ? ? ?Time: 5993-5701 ?PT Time Calculation (min) (ACUTE ONLY): 22 min ? ?Charges:  $Therapeutic Activity: 8-22 mins          ?          ? ?Marisa Severin, PT, DPT ?Acute Rehabilitation Services ?Secure chat preferred ?Office [phone] ? ? ? ? ? ?[place] ?05/30/2021, 11:35 AM ? ?

## 2021-05-30 NOTE — Plan of Care (Signed)
  Problem: Education: Goal: Ability to demonstrate management of disease process will improve Outcome: Progressing Goal: Ability to verbalize understanding of medication therapies will improve Outcome: Progressing Goal: Individualized Educational Video(s) Outcome: Progressing   Problem: Activity: Goal: Capacity to carry out activities will improve Outcome: Progressing   

## 2021-05-30 NOTE — Progress Notes (Addendum)
?Cardiology Progress Note  ?Patient ID: Jake [name] ?MRN: [id number] ?DOB: 12-18-1945 ?Date of Encounter: 05/30/2021 ? ?Primary Cardiologist: Elouise Munroe, MD ? ?Subjective  ? ?Chief Complaint: Weakness ? ?HPI: Worsening oxygen requirement despite adequate diuresis.  Hypotensive.  Concerns for sepsis and pneumonia per hospital medicine.  A-fib is rate controlled.  Now with significant AKI. ? ?ROS:  ?All other ROS reviewed and negative. Pertinent positives noted in the HPI.    ? ?Inpatient Medications  ?Scheduled Meds: ? budesonide (PULMICORT) nebulizer solution  0.25 mg Nebulization BID  ? empagliflozin  10 mg Oral Daily  ? folic acid  1 mg Oral Daily  ? gabapentin  300 mg Oral BID  ? magnesium oxide  400 mg Oral Daily  ? spironolactone  12.5 mg Oral Daily  ? sulfaSALAzine  500 mg Oral Daily  ? cyanocobalamin  1,000 mcg Oral Daily  ? Warfarin - Pharmacist Dosing Inpatient   Does not apply [id number]  ? ?Continuous Infusions: ? azithromycin 500 mg (05/30/21 0103)  ? cefTRIAXone (ROCEPHIN)  IV 2 g (05/30/21 0003)  ? ?PRN Meds: ?acetaminophen **OR** acetaminophen, albuterol, guaiFENesin-dextromethorphan, magnesium hydroxide, ondansetron **OR** ondansetron (ZOFRAN) IV, traMADol, traZODone  ? ?Vital Signs  ? ?Vitals:  ? 05/30/21 0300 05/30/21 0400 05/30/21 0509 05/30/21 0733  ?BP: (!) 86/52 (!) 81/56 (!) 87/61 98/62  ?Pulse: 78 78 79 95  ?Resp: 18 17 19 20   ?Temp:   98.8 ?F (37.1 ?C) 98.8 ?F (37.1 ?C)  ?TempSrc:   Oral   ?SpO2: (!) 88% 94% 90% 91%  ?Weight:      ?Height:      ? ? ?Intake/Output Summary (Last 24 hours) at 05/30/2021 0750 ?Last data filed at 05/30/2021 0051 ?Gross per 24 hour  ?Intake 240 ml  ?Output 2350 ml  ?Net -2110 ml  ? ? ?  05/30/2021  ?  2:31 AM 05/29/2021  ?  3:00 AM 05/28/2021  ?  5:00 AM  ?Last 3 Weights  ?Weight (lbs) 219 lb 9.3 oz 221 lb 9 oz 225 lb 8.5 oz  ?Weight (kg) 99.6 kg 100.5 kg 102.3 kg  ?   ? ?Telemetry  ?Overnight telemetry shows A-fib in the 80s, which I personally reviewed.  ? ?Physical  Exam  ? ?Vitals:  ? 05/30/21 0300 05/30/21 0400 05/30/21 0509 05/30/21 0733  ?BP: (!) 86/52 (!) 81/56 (!) 87/61 98/62  ?Pulse: 78 78 79 95  ?Resp: 18 17 19 20   ?Temp:   98.8 ?F (37.1 ?C) 98.8 ?F (37.1 ?C)  ?TempSrc:   Oral   ?SpO2: (!) 88% 94% 90% 91%  ?Weight:      ?Height:      ?  ?Intake/Output Summary (Last 24 hours) at 05/30/2021 0750 ?Last data filed at 05/30/2021 0051 ?Gross per 24 hour  ?Intake 240 ml  ?Output 2350 ml  ?Net -2110 ml  ?  ? ?  05/30/2021  ?  2:31 AM 05/29/2021  ?  3:00 AM 05/28/2021  ?  5:00 AM  ?Last 3 Weights  ?Weight (lbs) 219 lb 9.3 oz 221 lb 9 oz 225 lb 8.5 oz  ?Weight (kg) 99.6 kg 100.5 kg 102.3 kg  ?  Body mass index is 28.97 kg/m?.  ? ?General: Ill-appearing ?Head: Atraumatic, normal size  ?Eyes: PEERLA, EOMI  ?Neck: Supple, no JVD ?Endocrine: No thryomegaly ?Cardiac: Normal S1, S2; irregular rhythm ?Lungs: Coarse breath sounds, diffuse rhonchi ?Abd: Soft, nontender, no hepatomegaly  ?Ext: No edema, pulses 2+ ?Musculoskeletal: No deformities, BUE and BLE strength normal  and equal ?Skin: Warm and dry, no rashes   ?Neuro: Alert and oriented to person, place, time, and situation, CNII-XII grossly intact, no focal deficits  ?Psych: Normal mood and affect  ? ?Labs  ?High Sensitivity Troponin:   ?Recent Labs  ?Lab 05/25/21 ?1721 05/25/21 ?1910 05/30/21 ?0101  ?TROPONINIHS 29* 29* 110*  ?   ?Cardiac EnzymesNo results for input(s): TROPONINI in the last 168 hours. No results for input(s): TROPIPOC in the last 168 hours.  ?Chemistry ?Recent Labs  ?Lab 05/25/21 ?1721 05/26/21 ?0110 05/29/21 ?8115 05/30/21 ?0101 05/30/21 ?0541  ?NA 137   < > 136 136 137  ?K 4.1   < > 4.1 4.7 5.0  ?CL 98   < > 91* 89* 92*  ?CO2 32   < > 38* 35* 36*  ?GLUCOSE 162*   < > 101* 111* 131*  ?BUN 27*   < > 37* 43* 44*  ?CREATININE 1.22   < > 1.25* 1.99* 1.83*  ?CALCIUM 8.2*   < > 8.6* 8.7* 8.3*  ?PROT 5.8*  --   --   --   --   ?ALBUMIN 3.2*  --   --   --   --   ?AST 19  --   --   --   --   ?ALT 20  --   --   --   --   ?ALKPHOS  42  --   --   --   --   ?BILITOT 1.0  --   --   --   --   ?GFRNONAA >60   < > 59* 34* 38*  ?ANIONGAP 7   < > 7 12 9   ? < > = values in this interval not displayed.  ?  ?Hematology ?Recent Labs  ?Lab 05/26/21 ?0110 05/29/21 ?7262 05/30/21 ?0101  ?WBC 6.8 4.4 9.5  ?RBC 2.76* 2.95* 3.14*  ?HGB 9.5* 10.1* 11.0*  ?HCT 30.5* 31.6* 33.4*  ?MCV 110.5* 107.1* 106.4*  ?MCH 34.4* 34.2* 35.0*  ?MCHC 31.1 32.0 32.9  ?RDW 15.7* 15.0 15.0  ?PLT 94* 129* 128*  ? ?BNP ?Recent Labs  ?Lab 05/25/21 ?1721 05/30/21 ?0102  ?BNP 570.9* 570.3*  ?  ?DDimer  ?Recent Labs  ?Lab 05/30/21 ?0101  ?DDIMER 1.99*  ?  ? ?Radiology  ?DG Chest Port 1 View ? ?Result Date: 05/29/2021 ?CLINICAL DATA:  Acute respiratory distress EXAM: PORTABLE CHEST 1 VIEW COMPARISON:  05/28/2021, 05/06/2021, 07/29/2019 FINDINGS: Mild cardiomegaly with slight central congestion and mild diffuse interstitial edema. Interval development of more focal right greater than left lower lung airspace disease suspicious for pneumonia. No sizable effusion. Aortic atherosclerosis. No pneumothorax. IMPRESSION: 1. Mild cardiomegaly with slight central congestion and mild interstitial edema 2. Interval development of more confluent airspace disease in the right greater than left lower lung suspicious for pneumonia Electronically Signed   By: Donavan Foil M.D.   On: 05/29/2021 18:55  ? ?DG CHEST PORT 1 VIEW ? ?Result Date: 05/28/2021 ?CLINICAL DATA:  Shortness of breath EXAM: PORTABLE CHEST 1 VIEW COMPARISON:  05/25/2021 FINDINGS: Cardiomegaly. Unchanged diffuse bilateral interstitial pulmonary opacity. The visualized skeletal structures are unremarkable. IMPRESSION: Cardiomegaly with unchanged diffuse bilateral interstitial pulmonary opacity, likely edema. No new or focal airspace opacity. Electronically Signed   By: Delanna Ahmadi M.D.   On: 05/28/2021 10:58   ? ?Cardiac Studies  ?TTE 05/26/2021 ? 1. Left ventricular ejection fraction, by estimation, is 60 to 65%. The  ?left ventricle has  normal function. The left ventricle has  no regional  ?wall motion abnormalities. There is moderate left ventricular hypertrophy.  ?Left ventricular diastolic function  ? could not be evaluated.  ? 2. Right ventricular systolic function is low normal. The right  ?ventricular size is moderately enlarged. There is moderately elevated  ?pulmonary artery systolic pressure. The estimated right ventricular  ?systolic pressure is 73.4 mmHg.  ? 3. Left atrial size was mildly dilated.  ? 4. Right atrial size was severely dilated.  ? 5. The mitral valve is abnormal. Trivial mitral valve regurgitation.  ? 6. The aortic valve was not well visualized. Aortic valve regurgitation  ?is not visualized. No aortic stenosis is present.  ? 7. Aortic dilatation noted. There is borderline dilatation of the aortic  ?root, measuring 39 mm.  ? 8. The inferior vena cava is dilated in size with <50% respiratory  ?variability, suggesting right atrial pressure of 15 mmHg.  ? ?Patient Profile  ?Printice Hellmer is a 76 y.o. male with COPD, permanent atrial fibrillation, ongoing tobacco abuse, HFpEF, hypertension who was admitted on 05/25/2021 with acute on chronic diastolic heart failure and A-fib with RVR.  Also here with COPD exacerbation. ? ?Assessment & Plan  ? ?#Worsening hypoxic respiratory failure ?#Hypotension ?#Likely sepsis with pneumonia ?-Worsening respiratory failure overnight.  Hypotensive. ?-Euvolemic on exam.  Net -16.1 L since admission.  2.7 L negative overnight.  I do not believe this is related to heart failure. ?-Chest x-ray with worsening infiltrates.  Procalcitonin elevated. ?-His BNP remains the same despite very aggressive diuresis. ?-Would benefit from chest CT.  I have ordered this.  This will help tell us if he is having complications from pneumonia given worsening clinical status. ?-Hold beta-blocker, calcium channel blocker and digoxin.  We will pursue amiodarone for rate control if needed. ? ?#Acute on chronic diastolic  heart failure ?-Clinically euvolemic.  Net -16.1 L since admission.  Weight is down roughly 11 kg. ?-Do not believe his overall condition is related to worsening heart failure.  I believe this is related

## 2021-05-30 NOTE — Progress Notes (Signed)
Heart Failure Stewardship Pharmacist Progress Note ? ? ?PCP: Glendon Axe, MD ?PCP-Cardiologist: Elouise Munroe, MD  ? ? ?HPI:  ?76 yo M with PMH of CHF, cor pulmonale, CAD, HTN, afib, AAA, NSCLC s/p radiation, RA, CKD, tobacco use, and asthma/COPD. He presented to the ED on 4/5 with worsening LE edema, weight gain, dyspnea, and orthopnea. CXR with no evidence of acute cardiopulmonary disease. An ECHO was done on 4/6 and LVEF is 60-65% with moderate LVH and low normal RV function. Hospital course complicated by likely sepsis with PNA and significant AKI. ? ?Current HF Medications: ?SGLT2i: Jardiance 10 mg daily ? ?Prior to admission HF Medications: ?Diuretic: furosemide 20 mg PRN ? ?Pertinent Lab Values: ?Serum creatinine 1.83, BUN 44, Potassium 5.0, Sodium 137, BNP 570.9 ? ?Vital Signs: ?Weight: 219 lbs (admission weight: 243 lbs) ?Blood pressure: 80/60s  ?Heart rate: 80s  ?I/O: -2.8L yesterday; net -16L ? ?Medication Assistance / Insurance Benefits Check: ?Does the patient have prescription insurance?  Yes ?Type of insurance plan: [employer] ? ?Outpatient Pharmacy:  ?[facility] ?Is the patient willing to use [facility] at discharge? Yes ?Is the patient willing to transition their outpatient pharmacy to utilize a [facility] outpatient pharmacy?   Pending ?  ? ?Assessment: ?1. Acute on chronic diastolic CHF (EF 45-36%) due to multifactorial NICM (COPD, OSA, tobacco use, cor pulmonale). NYHA class II symptoms. ?- Off IV lasix - clinically euvolemic ?- BP too soft for ACE/ARB/ARNI and with AKI ?- Continue Jardiance 10 mg daily ?- Off digoxin and spironolactone with AKI ?  ?Plan: ?1) Medication changes recommended at this time: ?- Agree with changes as above ? ?2) Patient assistance: ?- $374.23 remaining on deductible ? ?3)  Education  ?- To be completed prior to discharge ? ?Kerby Nora, PharmD, BCPS ?Heart Failure Stewardship Pharmacist ?Phone [phone] ? ? ?

## 2021-05-30 NOTE — Progress Notes (Signed)
Patient with NRB at 15 L still agitated, trying to crawl out of bed quite continuously. Patient suddenly desaturated from about 93% down to 83% and then came back up to 85% and would not improve from there.  ? ?Desat was witnessed, NRB mask had not moved from face and no other interruption to oxygen flow. No other provoking circumstances noted for sudden desaturation. RT called back to bedside, Bipap reapplied. ? ?Temperature is noted to be 103.2 by tech. Retake by nurse at 102.2. Tylenol given. ? ?MD paged and notified of above. See orders. ?

## 2021-05-30 NOTE — Progress Notes (Signed)
Writer walked in to room with RT at bedside, who stated patient had just pulled Bipap tube out of mask.  ? ?Patient is very agitated, trying to crawl out of bed, grabbing frequently at Bipap mask, trying to pull it off his face, saying "I am suffocating." ? ?RR upper 30s at this time, lungs clear and dim in upper lobes, coarse throughout both lower lobes. HR upper 90s in a fib. ? ?RR paged back to bedside. When RR arrived, changed Bipap to 15 L NRB. Patient did calm down after this, breathing more comfortably and saturating low to mid 90s.  ? ?MD paged, Haldol ordered and given, continue to monitor. ?

## 2021-05-30 NOTE — Plan of Care (Addendum)
?  Problem: Education: ?Goal: Ability to demonstrate management of disease process will improve ?Outcome: Progressing ?  ?Problem: Activity: ?Goal: Capacity to carry out activities will improve ?Outcome: Progressing ?  ?Problem: Cardiac: ?Goal: Ability to achieve and maintain adequate cardiopulmonary perfusion will improve ?Outcome: Progressing ?  ?

## 2021-05-30 NOTE — Care Management Important Message (Signed)
Important Message ? ?Patient Details  ?Name: Jake [name] ?MRN: [id number] ?Date of Birth: 04/05/1945 ? ? ?Medicare Important Message Given:  Yes ? ? ? ? ?[name] ?05/30/2021, 3:43 PM ?

## 2021-05-30 NOTE — Progress Notes (Signed)
Patient again would not tolerated bipap, continuously pulling it off his face and very agitated, trying to crawl out of bed, again stating "I am suffocating." ? ?RT back to bedside and heated high flow along with NRB applied to maintain saturations of 89% to 92%. ? ?Continue to monitor. ?

## 2021-05-30 NOTE — Evaluation (Signed)
Occupational Therapy Evaluation ?Patient Details ?Name: Jake [name] ?MRN: [id number] ?DOB: 1945-08-20 ?Today's Date: 05/30/2021 ? ? ?History of Present Illness Pt is a 76 y.o. M who presents 05/25/2021 with working diagnosis of decompensated heart failure. Significant PMH: heart failure, COPD, HTN, atrial fibrillation, asthma, peripheral polyneuropathy, RA.  ? ?Clinical Impression ?  ?Prior to this admission, patient was living independently with his wife and grandson. Patient endorses he still drove, was completing bird baths the last few weeks due to increased fatigue, but otherwise independent with ADLs. Currently, patient is requiring 30L HFNC with O2 ranging from 84-86%, low blood pressure levels (see levels below) and need for min-mod A for ADLs and basic transfers. Patient with increased fatigue in session, minimal LOB backwards when attempting stand pivot to recliner with patient falling back into the chair. Patient unable to progress further with mobility or activity due to fatigue and need for increased time for O2 to recover. OT recommending AIR consult due to current level of function in comparison to previous level of independence. OT will continue to follow acutely to address functional deficits listed below.  ? ?Blood Pressure: ?Sitting EOB:86/48 (60) ?Sitting EOB 2-3 minutes 95/57 (70) ?After completing transfer to chair, unable to obtain standing pressure due to fatigue: 96/62 (72)  ? ?Recommendations for follow up therapy are one component of a multi-disciplinary discharge planning process, led by the attending physician.  Recommendations may be updated based on patient status, additional functional criteria and insurance authorization.  ? ?Follow Up Recommendations ? Acute inpatient rehab (3hours/day)  ?  ?Assistance Recommended at Discharge Intermittent Supervision/Assistance  ?Patient can return home with the following A little help with walking and/or transfers;A lot of help with  bathing/dressing/bathroom;Assistance with cooking/housework;Assistance with feeding;Direct supervision/assist for medications management;Direct supervision/assist for financial management;Assist for transportation;Help with stairs or ramp for entrance ? ?  ?Functional Status Assessment ? Patient has had a recent decline in their functional status and demonstrates the ability to make significant improvements in function in a reasonable and predictable amount of time.  ?Equipment Recommendations ? None recommended by OT (Patient has DME needed)  ?  ?Recommendations for Other Services Rehab consult ? ? ?  ?Precautions / Restrictions Precautions ?Precautions: Fall;Other (comment) ?Precaution Comments: HFNC ?Restrictions ?Weight Bearing Restrictions: No ?Other Position/Activity Restrictions: watch O2 (30 L HFNC) and BP  ? ?  ? ?Mobility Bed Mobility ?Overal bed mobility: Modified Independent ?  ?  ?  ?  ?  ?  ?General bed mobility comments: increased time due to fatigue ?  ? ?Transfers ?Overall transfer level: Needs assistance ?Equipment used: Rolling walker (2 wheels) ?Transfers: Sit to/from Stand ?Sit to Stand: Min assist ?  ?  ?  ?  ?  ?General transfer comment:  (min A to power up, good hand placement noted, minimal LOB backwards when attempting stand pivot to recliner with patient falling back into the chair) ?  ? ?  ?Balance Overall balance assessment: Needs assistance ?Sitting-balance support: Bilateral upper extremity supported, Feet supported ?Sitting balance-Leahy Scale: Fair ?  ?  ?Standing balance support: Bilateral upper extremity supported, During functional activity, Reliant on assistive device for balance ?Standing balance-Leahy Scale: Poor ?Standing balance comment: reliant on RW ?  ?  ?  ?  ?  ?  ?  ?  ?  ?  ?  ?   ? ?ADL either performed or assessed with clinical judgement  ? ?ADL Overall ADL's : Needs assistance/impaired ?Eating/Feeding: Set up;Sitting ?  ?Grooming: Set up;Wash/dry hands;Wash/dry  face;Sitting ?  ?Upper Body Bathing: Set up;Sitting ?  ?Lower Body Bathing: Moderate assistance;Sitting/lateral leans;Sit to/from stand ?  ?Upper Body Dressing : Set up;Sitting ?  ?Lower Body Dressing: Moderate assistance;Sitting/lateral leans;Sit to/from stand ?  ?Toilet Transfer: Moderate assistance;Stand-pivot;Regular Toilet;Rolling walker (2 wheels) ?Toilet Transfer Details (indicate cue type and reason): simluated with transfer to recliner, patient with minimal posterior LOB backward into recliner when attempting stand pivot ?  ?  ?  ?  ?Functional mobility during ADLs: Moderate assistance;Cueing for sequencing;Cueing for safety;Rolling walker (2 wheels) ?General ADL Comments: Patient presenting with fatigue, decreased activity tolerance, on HFNC (30 L) and soft BPs  ? ? ? ?Vision Baseline Vision/History: 0 No visual deficits ?Ability to See in Adequate Light: 0 Adequate ?Patient Visual Report: No change from baseline ?   ?   ?Perception   ?  ?Praxis   ?  ? ?Pertinent Vitals/Pain Pain Assessment ?Pain Assessment: Faces ?Faces Pain Scale: Hurts a little bit ?Pain Location:  (R hip with movement) ?Pain Descriptors / Indicators: Discomfort, Grimacing ?Pain Intervention(s): Limited activity within patient's tolerance, Monitored during session, Repositioned  ? ? ? ?Hand Dominance   ?  ?Extremity/Trunk Assessment Upper Extremity Assessment ?Upper Extremity Assessment: Generalized weakness ?  ?Lower Extremity Assessment ?Lower Extremity Assessment: Defer to PT evaluation ?  ?Cervical / Trunk Assessment ?Cervical / Trunk Assessment: Kyphotic ?  ?Communication Communication ?Communication: HOH ?  ?Cognition Arousal/Alertness: Awake/alert ?Behavior During Therapy: [facility] for tasks assessed/performed ?Overall Cognitive Status: Within Functional Limits for tasks assessed ?  ?  ?  ?  ?  ?  ?  ?  ?  ?  ?  ?  ?  ?  ?  ?  ?General Comments: minimally tangential but appropriate ?  ?  ?General Comments    ? ?  ?Exercises   ?   ?Shoulder Instructions    ? ? ?Home Living Family/patient expects to be discharged to:: Private residence ?Living Arrangements: Spouse/significant other;Other relatives (grandson) ?Available Help at Discharge: Family ?Type of Home: House ?Home Access: Ramped entrance ?  ?  ?Home Layout: One level ?  ?  ?Bathroom Shower/Tub: Tub/shower unit ?  ?  ?  ?  ?Home Equipment: Cane - single Barista (2 wheels);Wheelchair - manual;BSC/3in1;Shower seat ?  ?  ?  ? ?  ?Prior Functioning/Environment Prior Level of Function : Independent/Modified Independent;Driving ?  ?  ?  ?  ?  ?  ?Mobility Comments: uses cane intermittently, denies falls, ? fairly sedentary ?ADLs Comments: needed assist for lower body dressing (socks) usually did without, has not showered in the past month (bird bathes) ?  ? ?  ?  ?OT Problem List: Decreased strength;Decreased range of motion;Impaired balance (sitting and/or standing);Decreased activity tolerance;Decreased coordination;Decreased safety awareness;Decreased knowledge of use of DME or AE;Cardiopulmonary status limiting activity;Pain;Increased edema ?  ?   ?OT Treatment/Interventions: Self-care/ADL training;Therapeutic exercise;Energy conservation;Therapeutic activities;Visual/perceptual remediation/compensation;Patient/family education;Manual therapy;DME and/or AE instruction;Balance training  ?  ?OT Goals(Current goals can be found in the care plan section) Acute Rehab OT Goals ?Patient Stated Goal: to get back home ?OT Goal Formulation: With patient ?Time For Goal Achievement: 06/13/21 ?Potential to Achieve Goals: Good ?ADL Goals ?Pt Will Perform Lower Body Bathing: with adaptive equipment;sitting/lateral leans;sit to/from stand;with set-up ?Pt Will Perform Lower Body Dressing: with set-up;with adaptive equipment;sitting/lateral leans;sit to/from stand ?Pt Will Transfer to Toilet: with set-up;ambulating ?Pt Will Perform Toileting - Clothing Manipulation and hygiene: with  set-up;sitting/lateral leans;sit to/from stand;with adaptive equipment ?Additional ADL Goal #1: Patient  will demonstrate increased activity tolerance to complete functional task in standing for 3-5 minutes without need for seated rest b

## 2021-05-30 NOTE — Evaluation (Signed)
Clinical/Bedside Swallow Evaluation ?Patient Details  ?Name: Jake [name] ?MRN: [id number] ?Date of Birth: 1945/06/19 ? ?Today's Date: 05/30/2021 ?Time: SLP Start Time (ACUTE ONLY): 4174 SLP Stop Time (ACUTE ONLY): 0814 ?SLP Time Calculation (min) (ACUTE ONLY): 18 min ? ?Past Medical History:  ?Past Medical History:  ?Diagnosis Date  ? Anemia   ? Asthma   ? Chronic diastolic (congestive) heart failure (HCC)   ? Chronic respiratory failure ([place])   ? CKD (chronic kidney disease) stage 2, GFR 60-89 ml/min   ? COPD (chronic obstructive pulmonary disease) ([place])   ? Cor pulmonale (HCC)   ? Coronary artery calcification   ? Coronary artery disease   ? Hepatitis A   ? Hypertension   ? Lung cancer ([place])   ? OSA (obstructive sleep apnea)   ? Permanent atrial fibrillation ([place])   ? Pulmonary hypertension ([place])   ? Rheumatoid arthritis ([place])   ? Thrombocytopenia ([place])   ? ?Past Surgical History:  ?Past Surgical History:  ?Procedure Laterality Date  ? BACK SURGERY    ? FRACTURE SURGERY    ? JOINT REPLACEMENT    ? LAPAROTOMY N/A 07/25/2019  ? Procedure: EXPLORATORY LAPAROTOMY,REPAIR AND ANASTOMOSIS OF SMALL BOWEL, ENTEROLYSIS;  Surgeon: [name], MD;  Location: [facility];  Service: General;  Laterality: N/A;  ? ?HPI:  ?Pt is a 76 y.o. M who presents 05/25/2021 with working diagnosis of decompensated heart failure. Rapid response called 4/9 due to acute oxygen desaturation requiring BiPAP and then [place]. Concern for new RLL pna. New  Significant PMH: heart failure, COPD, HTN, atrial fibrillation, asthma, peripheral polyneuropathy, RA.  ?  ?Assessment / Plan / Recommendation  ?Clinical Impression ? SLP consulted in light of new concerns for aspiration.  Assessed pt while he was eating his lunch.  Oral mechanism exam normal; no focal CN deficits. Pt self fed with thorough mastication, brisk swallow response, no s/s of aspiration.  He ate quickly, loading his mouth with five boluses of solid food prior to swallowing, however tolerated  well with no coughing or concerns for dysphagia.  He was cautioned to slow down and take his time while he is on such high levels of oxygen. We discussed the shared anatomy for swallowing and breathing and the risks of aspiration.  He verbalized understanding.  No signs today of dysphagia. Recommend continuing current diet with caution.  If concerns recur, please reconsult SLP and we can move forward with an instrumental swallow study. ?SLP Visit Diagnosis: Dysphagia, unspecified (R13.10) ?   ?Aspiration Risk ? No limitations  ?  ?Diet Recommendation   Regular solids, thin liquids ? ?Medication Administration: Whole meds with liquid  ?  ?Other  Recommendations Oral Care Recommendations: Oral care BID   ? ?Recommendations for follow up therapy are one component of a multi-disciplinary discharge planning process, led by the attending physician.  Recommendations may be updated based on patient status, additional functional criteria and insurance authorization. ? ?Follow up Recommendations No SLP follow up  ? ? ?  ? ? ?Swallow Study   ?General HPI: Pt is a 76 y.o. M who presents 05/25/2021 with working diagnosis of decompensated heart failure. Rapid response called 4/9 due to acute oxygen desaturation requiring BiPAP and then [place]. Concern for new RLL pna. New  Significant PMH: heart failure, COPD, HTN, atrial fibrillation, asthma, peripheral polyneuropathy, RA. ?Type of Study: Bedside Swallow Evaluation ?Previous Swallow Assessment: June 2021 - regular diet, thin liquids recommended ?Diet Prior to this Study: Regular;Thin liquids ?Temperature Spikes Noted: Yes ?  Respiratory Status: Other (comment) (HFNC 30L) ?History of Recent Intubation: No ?Behavior/Cognition: Alert;Cooperative;Pleasant mood ?Oral Cavity Assessment: Within Functional Limits ?Oral Care Completed by SLP: Recent completion by staff ?Oral Cavity - Dentition: Adequate natural dentition ?Vision: Functional for self-feeding ?Self-Feeding Abilities: Able to  feed self ?Patient Positioning: Upright in chair ?Baseline Vocal Quality: Normal ?Volitional Swallow: Able to elicit  ?  ?Oral/Motor/Sensory Function Overall Oral Motor/Sensory Function: Within functional limits   ?Ice Chips Ice chips: Within functional limits   ?Thin Liquid Thin Liquid: Within functional limits  ?  ?Nectar Thick Nectar Thick Liquid: Not tested   ?Honey Thick Honey Thick Liquid: Not tested   ?Puree Puree: Within functional limits   ?Solid ? ? ?  Solid: Within functional limits  ? ?  ? ?Juan Quam Laurice ?05/30/2021,12:25 PM ? ?Elanna Bert L. Rufina Kimery, MA CCC/SLP ?Acute Rehabilitation Services ?Office number [phone] ?Pager [phone] ? ? ?

## 2021-05-30 NOTE — Progress Notes (Addendum)
?Progress Note ? ? ?Patient: Jake [name] DDU:[id number] DOB: 11/24/1945 DOA: 05/25/2021     5 ?DOS: the patient was seen and examined on 05/30/2021 ?  ?Brief hospital course: ?Mr. Baumgardner was admitted to the hospital with the working diagnosis of decompensated heart failure.  ?Hospitalization complicated with right lower lobe aspiration pneumonia and sepsis.  ? ?76 yo male with the past medical history of heart failure, COPD, hypertension, atrial fibrillation and asthma who presented with dyspnea and lower extremity edema. Reported worsening lower extremity edema for the last 6 weeks, he has gain 20 over last 2 weeks. Symptoms associated with dyspnea and 2 pillow orthopnea. On the day of admission home health nurse advice to come to the ED due worsening symptoms. On his initial physical examination his blood pressure is 101/57, HR 88, RR 16 and 02 saturation 92%, lungs with decreased breath sounds bilaterally, positive rales and wheezing, increased work of breathing, heart with S1 and S2 present and rhythmic, abdomen not distended and positive lower extremity edema ++.  ? ?Na 137, K 4,1 Cl 98, bicarbonate 32, glucose 162, bun 27, cr 1,2 ?BNP 570 ?Troponin 29-29  ?Wbc 6.3 hgb 10,0 hct 32, plt 91  ?INR 2,0  ? ?Chest radiograph with no cardiomegaly, bilateral interstitial infiltrates, small right pleural effusion.  ? ?EKG with 87 bpm, left axis deviation, normal intervals, atrial fibrillation with PVC, q wave V1 and V2 with no significant ST segment or T wave changes.  ? ?Patient was placed on IV furosemide for diuresis with good toleration. ?Had rapid ventricular response to atrial fibrillation and his AV blockade was adjusted with increase dose of diltiazem and metoprolol.  ? ?04/09 patient had worsening respiratory failure, with increase oxygen requirements. ?Chest film with new right lower lobe infiltrate, suspected aspiration pneumonia.  ?Started on antibiotic therapy.  ? ?Assessment and Plan: ?* Acute on chronic  diastolic CHF (congestive heart failure) ([place]) ?Echocardiogram with LV systolic function preserved EF 60 to 23%, RV systolic function preserved. Right atrial severe dilatation. (poor acoustic windows)  ? ?04/07 chest radiograph with bilateral interstitial infiltrates with hilar vascular congestion, positive hyperinflation. (personally reviewed).  ? ?Urine output over last 24 hrs is 3,250 ml  ?Hypotensive with systolic blood pressure 87 to 107 mmHg,  ? ?Diuresis on hold.  ?Continue close follow up on blood pressure.  ? ? ?Aspiration pneumonia ([place]) ?Acute on chronic hypoxemic respiratory failure. ?Severe sepsis not present on admission   ? ?Patient with no aspiration pneumonia, not present on admission.  ?Chest film personally reviewed noted right lower lobe infiltrate.  ? ?Oxygenation is 90% on 30 L/min per HFNC and 100% Fi02.  ?Yesterday was placed on Bipap that he did not tolerate very well.  ? ?Plan to continue antibiotic therapy with Unasyn. ?Follow up cell count and cultures. ?Consult speech therapy for swallow evaluation ?Continue bronchodilator therapy and oxymetry monitoring, target 02 saturation 88% or greater.  ? ?Patient with acute metabolic encephalopathy due to sepsis, he did received haloperidol last night.  ?This morning his mentation has improved with resolution of encephalopathy.  ?Plan to continue close neuro checks per unit protocol.  ? ? ?COPD with acute exacerbation ([place]) ?Continue bronchodilator therapy  ? ?Continue oxymetry monitoring and supplemental 02 per [place] to keep 02 saturation 92% or greater.  ? ?On bronchodilator therapy and  inhaled corticosteroids.  ? ? ?A-fib ([place]) ?Home medications include metoprolol XL 50 mg and diltiazem 180 mg. ? ?AV blocking agents have been stopped in the setting of  sepsis.  ?Heart rate has been controlled.  ?If recurrent RVR plan for rate control with amiodarone. ? ?CKD (chronic kidney disease) stage 2, GFR 60-89 ml/min ?Improved volume status, renal function  with serum cr at 1,83 with K at 5,0 and serum bicarbonate at 36. ?Plan to continue close follow up renal function and electrolytes. ?Avoid hypotension and nephrotoxic medications. ?Holding on diuretic therapy.  ? ?Essential hypertension ?Patient with hypotensive episode. ?Continue to hold on antihypertensive medications.  ? ?Rheumatoid arthritis ([place]) ?No active flare.  ?Follow up as outpatient.  ? ?Peripheral polyneuropathy ?Stable, continue medical therapy with gabapenitn.  ? ?Anemia ?Thrombocytopenia ?Likely anemia of chronic disease.  ? ? ?Class 1 obesity ?Calculated BMI is 31.5 ? ? ? ? ? ?  ? ?Subjective: patient with worsening respiratory failure and hypotension last night.  ? ?Physical Exam: ?Vitals:  ? 05/30/21 0509 05/30/21 0733 05/30/21 0802 05/30/21 1107  ?BP: (!) 87/61 98/62 (!) 111/57 101/62  ?Pulse: 79 95 82 83  ?Resp: 19 20 (!) 24 13  ?Temp: 98.8 ?F (37.1 ?C) 98.8 ?F (37.1 ?C)  (!) 97.4 ?F (36.3 ?C)  ?TempSrc: Oral     ?SpO2: 90% 91% 90% 98%  ?Weight:      ?Height:      ? ?Neurology awake and alert ?ENT with mild pallor ?Cardiovascular with S1 and S2 present irregularly irregular with no gallops or murmurs, no rubs ?Respiratory with rhonchi and rales at the right base, no wheezing  ?Abdomen not distended ? ?Data Reviewed: ? ? ? ?Family Communication: I spoke over the phone with the patient's daughter about patient's  condition, plan of care, prognosis and all questions were addressed.  ? ?Disposition: ?Status is: Inpatient ?Remains inpatient appropriate because: respiratory failure  ? Planned Discharge Destination: Home ? ?Author: ?Tawni Millers, MD ?05/30/2021 12:29 PM ? ?For on call review [email or web address].  ?

## 2021-05-30 NOTE — Progress Notes (Addendum)
Patient on 25 L 70% HFNC. He had a sudden desat down to 84% only coming back up to 85%. Turned HFNC to 29 L 70%. ? ?RT came to room shortly after for a breathing treatment. About 5 minutes after they left, patient began alarming again and was sitting at 83% unable to move up. Noted HFNC had been turned back to 25 L.  ? ?Turned HFNC to 30 L. Patient recovered to 90-91%. Continue to monitor. ?

## 2021-05-30 NOTE — Assessment & Plan Note (Addendum)
Acute on chronic hypoxemic respiratory failure. ?Severe sepsis not present on admission   ? ?Patient with aspiration pneumonia, not present on admission.  ?Chest film/CT chest personally reviewed noted right lower lobe infiltrate.  ? ?Patient required short term non invasive mechanical ventilation and transitioned to heated high flow nasal cannula.  ? ?Patient has completed antibiotic therapy with Unasyn.  ? ?He received bronchodilator therapy, airway clearing techniques with flutter valve and incentive spiometer.   ?Out of bed, Pt and Ot. ? ?His oxygen requirements have improved, at the time of his discharge his 02 saturation is 94% on 4 L/min per [place].  ? ?Acute metabolic encephalopathy due to sepsis, he did received haloperidol one time.  ?Encephalopathy has resolved.  ?

## 2021-05-31 DIAGNOSIS — I482 Chronic atrial fibrillation, unspecified: Secondary | ICD-10-CM | POA: Diagnosis not present

## 2021-05-31 DIAGNOSIS — J690 Pneumonitis due to inhalation of food and vomit: Secondary | ICD-10-CM | POA: Diagnosis not present

## 2021-05-31 DIAGNOSIS — J441 Chronic obstructive pulmonary disease with (acute) exacerbation: Secondary | ICD-10-CM | POA: Diagnosis not present

## 2021-05-31 DIAGNOSIS — I5033 Acute on chronic diastolic (congestive) heart failure: Secondary | ICD-10-CM | POA: Diagnosis not present

## 2021-05-31 LAB — BASIC METABOLIC PANEL
Anion gap: 4 — ABNORMAL LOW (ref 5–15)
BUN: 41 mg/dL — ABNORMAL HIGH (ref 8–23)
CO2: 38 mmol/L — ABNORMAL HIGH (ref 22–32)
Calcium: 8.4 mg/dL — ABNORMAL LOW (ref 8.9–10.3)
Chloride: 93 mmol/L — ABNORMAL LOW (ref 98–111)
Creatinine, Ser: 1.4 mg/dL — ABNORMAL HIGH (ref 0.61–1.24)
GFR, Estimated: 52 mL/min — ABNORMAL LOW (ref >60)
Glucose, Bld: 116 mg/dL — ABNORMAL HIGH (ref 70–99)
Potassium: 4.2 mmol/L (ref 3.5–5.1)
Sodium: 135 mmol/L (ref 135–145)

## 2021-05-31 LAB — CBC
HCT: 27.7 % — ABNORMAL LOW (ref 39.0–52.0)
Hemoglobin: 8.7 g/dL — ABNORMAL LOW (ref 13.0–17.0)
MCH: 34 pg (ref 26.0–34.0)
MCHC: 31.4 g/dL (ref 30.0–36.0)
MCV: 108.2 fL — ABNORMAL HIGH (ref 80.0–100.0)
Platelets: 119 10*3/uL — ABNORMAL LOW (ref 150–400)
RBC: 2.56 MIL/uL — ABNORMAL LOW (ref 4.22–5.81)
RDW: 14.9 % (ref 11.5–15.5)
WBC: 5 10*3/uL (ref 4.0–10.5)
nRBC: 0 % (ref 0.0–0.2)

## 2021-05-31 LAB — PROTIME-INR
INR: 2.7 — ABNORMAL HIGH (ref 0.8–1.2)
Prothrombin Time: 28.6 seconds — ABNORMAL HIGH (ref 11.4–15.2)

## 2021-05-31 MED ORDER — WARFARIN SODIUM 5 MG PO TABS
5.0000 mg | ORAL_TABLET | Freq: Once | ORAL | Status: AC
  Administered 2021-05-31: 5 mg via ORAL
  Filled 2021-05-31: qty 1

## 2021-05-31 MED ORDER — METOPROLOL TARTRATE 25 MG PO TABS
25.0000 mg | ORAL_TABLET | Freq: Two times a day (BID) | ORAL | Status: DC
  Administered 2021-05-31 (×3): 25 mg via ORAL
  Filled 2021-05-31 (×3): qty 1

## 2021-05-31 NOTE — Progress Notes (Signed)
Physical Therapy Treatment ?Patient Details ?Name: Jake [name] ?MRN: [id number] ?DOB: 10-16-1945 ?Today's Date: 05/31/2021 ? ? ?History of Present Illness Pt is a 76 y.o. M who presents 05/25/2021 with working diagnosis of decompensated heart failure. Significant PMH: heart failure, COPD, HTN, atrial fibrillation, asthma, peripheral polyneuropathy, RA. ? ?  ?PT Comments  ? ? Pt progressing towards his physical therapy goals. Able to perform sitting/standing exercises for strengthening and endurance and transferring to chair with min-mod assist and RW. SpO2 > 90% on 15L, 50% FiO2 via HHFNC; RT in room post session to adjust settings. Pt continuing to report R hip pain with weightbearing or hip flexion. Pt demonstrates decreased cardiopulmonary endurance, generalized weakness, impaired balance. Would benefit from AIR to address. ?   ?Recommendations for follow up therapy are one component of a multi-disciplinary discharge planning process, led by the attending physician.  Recommendations may be updated based on patient status, additional functional criteria and insurance authorization. ? ?Follow Up Recommendations ? Acute inpatient rehab (3hours/day) ?  ?  ?Assistance Recommended at Discharge PRN  ?Patient can return home with the following A little help with walking and/or transfers;A little help with bathing/dressing/bathroom;Assist for transportation;Assistance with cooking/housework ?  ?Equipment Recommendations ? None recommended by PT  ?  ?Recommendations for Other Services   ? ? ?  ?Precautions / Restrictions Precautions ?Precautions: Fall;Other (comment) ?Precaution Comments: [place] ?Restrictions ?Weight Bearing Restrictions: No ?Other Position/Activity Restrictions: watch O2 and BP  ?  ? ?Mobility ? Bed Mobility ?Overal bed mobility: Modified Independent ?  ?  ?  ?  ?  ?  ?General bed mobility comments: increased time due to fatigue ?  ? ?Transfers ?Overall transfer level: Needs assistance ?Equipment used:  Rolling walker (2 wheels) ?Transfers: Sit to/from Stand, Bed to chair/wheelchair/BSC ?Sit to Stand: Mod assist ?  ?Step pivot transfers: Min assist ?  ?  ?  ?General transfer comment: ModA to power up to standing position x 2, pivotal steps to chair with minA ?  ? ?Ambulation/Gait ?  ?  ?  ?  ?  ?  ?  ?  ? ? ?Stairs ?  ?  ?  ?  ?  ? ? ?Wheelchair Mobility ?  ? ?Modified Rankin (Stroke Patients Only) ?  ? ? ?  ?Balance Overall balance assessment: Needs assistance ?Sitting-balance support: Bilateral upper extremity supported, Feet supported ?Sitting balance-Leahy Scale: Fair ?  ?  ?Standing balance support: Bilateral upper extremity supported, During functional activity, Reliant on assistive device for balance ?Standing balance-Leahy Scale: Poor ?Standing balance comment: reliant on RW ?  ?  ?  ?  ?  ?  ?  ?  ?  ?  ?  ?  ? ?  ?Cognition Arousal/Alertness: Awake/alert ?Behavior During Therapy: [facility] for tasks assessed/performed ?Overall Cognitive Status: Within Functional Limits for tasks assessed ?  ?  ?  ?  ?  ?  ?  ?  ?  ?  ?  ?  ?  ?  ?  ?  ?General Comments: minimally tangential but appropriate ?  ?  ? ?  ?Exercises General Exercises - Lower Extremity ?Long Arc Quad: Both, 10 reps, Seated ?Heel Slides: Both, 10 reps, Supine ?Straight Leg Raises: Both, 10 reps, Supine ?Hip Flexion/Marching: Both, 10 reps, Seated ?Heel Raises: Both, 10 reps, Standing ?Mini-Sqauts: Both, 10 reps, Standing ? ?  ?General Comments   ?  ?  ? ?Pertinent Vitals/Pain Pain Assessment ?Pain Assessment: Faces ?Faces Pain Scale: Hurts little more ?Pain  Location:  (R hip with movement) ?Pain Descriptors / Indicators: Discomfort, Grimacing ?Pain Intervention(s): Monitored during session, Limited activity within patient's tolerance  ? ? ?Home Living   ?  ?  ?  ?  ?  ?  ?  ?  ?  ?   ?  ?Prior Function    ?  ?  ?   ? ?PT Goals (current goals can now be found in the care plan section) Acute Rehab PT Goals ?Potential to Achieve Goals: Good ?Progress  towards PT goals: Progressing toward goals ? ?  ?Frequency ? ? ? Min 3X/week ? ? ? ?  ?PT Plan Current plan remains appropriate  ? ? ?Co-evaluation   ?  ?  ?  ?  ? ?  ?AM-PAC PT "6 Clicks" Mobility   ?Outcome Measure ? Help needed turning from your back to your side while in a flat bed without using bedrails?: None ?Help needed moving from lying on your back to sitting on the side of a flat bed without using bedrails?: None ?Help needed moving to and from a bed to a chair (including a wheelchair)?: A Little ?Help needed standing up from a chair using your arms (e.g., wheelchair or bedside chair)?: A Lot ?Help needed to walk in hospital room?: A Little ?Help needed climbing 3-5 steps with a railing? : A Lot ?6 Click Score: 18 ? ?  ?End of Session Equipment Utilized During Treatment: Oxygen;Gait belt ?Activity Tolerance: Patient tolerated treatment well ?Patient left: in chair;with call bell/phone within reach ?Nurse Communication: Mobility status ?PT Visit Diagnosis: Unsteadiness on feet (R26.81);Difficulty in walking, not elsewhere classified (R26.2) ?  ? ? ?Time: 3354-5625 ?PT Time Calculation (min) (ACUTE ONLY): 33 min ? ?Charges:  $Therapeutic Exercise: 8-22 mins ?$Therapeutic Activity: 8-22 mins          ?          ? ?Wyona Almas, PT, DPT ?Acute Rehabilitation Services ?Pager [phone] ?Office [phone] ? ? ? ?Carloine Margo Aye ?05/31/2021, 5:02 PM ? ?

## 2021-05-31 NOTE — Progress Notes (Signed)
?Progress Note ? ? ?Patient: Jake [name] QIW:[id number] DOB: 10-02-1945 DOA: 05/25/2021     6 ?DOS: the patient was seen and examined on 05/31/2021 ?  ?Brief hospital course: ?Mr. Platten was admitted to the hospital with the working diagnosis of decompensated heart failure.  ?Hospitalization complicated with right lower lobe aspiration pneumonia and sepsis.  ? ?76 yo male with the past medical history of heart failure, COPD, hypertension, atrial fibrillation and asthma who presented with dyspnea and lower extremity edema. Reported worsening lower extremity edema for the last 6 weeks, he has gain 20 over last 2 weeks. Symptoms associated with dyspnea and 2 pillow orthopnea. On the day of admission home health nurse advice to come to the ED due worsening symptoms. On his initial physical examination his blood pressure is 101/57, HR 88, RR 16 and 02 saturation 92%, lungs with decreased breath sounds bilaterally, positive rales and wheezing, increased work of breathing, heart with S1 and S2 present and rhythmic, abdomen not distended and positive lower extremity edema ++.  ? ?Na 137, K 4,1 Cl 98, bicarbonate 32, glucose 162, bun 27, cr 1,2 ?BNP 570 ?Troponin 29-29  ?Wbc 6.3 hgb 10,0 hct 32, plt 91  ?INR 2,0  ? ?Chest radiograph with no cardiomegaly, bilateral interstitial infiltrates, small right pleural effusion.  ? ?EKG with 87 bpm, left axis deviation, normal intervals, atrial fibrillation with PVC, q wave V1 and V2 with no significant ST segment or T wave changes.  ? ?Patient was placed on IV furosemide for diuresis with good toleration. ?Had rapid ventricular response to atrial fibrillation and his AV blockade was adjusted with increase dose of diltiazem and metoprolol.  ? ?04/09 patient had worsening respiratory failure, with increase oxygen requirements. ?Chest film with new right lower lobe infiltrate, suspected aspiration pneumonia.  ?Started on antibiotic therapy.  ? ?04/11 improving oxygenation, with decreased  02 requirements from 30 L/min with 100% fi02 per heated high flow nasal cannula down to 15 L/min with 60% fi02.   ? ?Assessment and Plan: ?* Acute on chronic diastolic CHF (congestive heart failure) ([place]) ?Echocardiogram with LV systolic function preserved EF 60 to 41%, RV systolic function preserved. Right atrial severe dilatation. (poor acoustic windows)  ? ?04/07 chest radiograph with bilateral interstitial infiltrates with hilar vascular congestion, positive hyperinflation. (personally reviewed).  ? ?04/09 chest film with improved pulmonary edema but new right lower lobe infiltrate.  ? ?Improved volume status, diuresis has been held to prevent hypotension.  ?His overall fluid balance is negative 17,855 ml since admission.  ? ?Continue close blood pressure monitoring. ?Continue metoprolol for rate control atrial fibrillation.  ? ? ?Aspiration pneumonia ([place]) ?Acute on chronic hypoxemic respiratory failure. ?Severe sepsis not present on admission   ? ?Patient with no aspiration pneumonia, not present on admission.  ?Chest film/CT chest personally reviewed noted right lower lobe infiltrate.  ? ?Patient required short term non invasive mechanical ventilation and transitioned to heated high flow nasal cannula.  ?Today with decreased 02 requirements down to 60% Fi02 and 15 L/min per heated high flow nasal cannula.  ? ?Continue antibiotic therapy with Unasyn. ?Bronchodilator therapy ?Out of bed to chair tid with meals.  ?Airway clearing techniques with flutter valve and incentive spiometer.  ? ?Acute metabolic encephalopathy due to sepsis, he did received haloperidol one time.  ?Encephalopathy has resolved.  ? ?COPD with acute exacerbation ([place]) ?Continue bronchodilator therapy and inhaled corticosteroids.  ? ?A-fib ([place]) ?Home medications include metoprolol XL 50 mg and diltiazem 180 mg. ? ?Heart  rate in the 80 to 90 range, continue atrial fibrillation rhythm. ?Placed back on low dose metoprolol and continue close  telemetry monitoring ?Continue anticoagulation with apixaban.  ? ?CKD (chronic kidney disease) stage 2, GFR 60-89 ml/min ?Renal function continue to improve, with serum cr at 1,40 with K at 4,2 and serum bicarbonate at 38, ?Continue to hold on furosemide ?Improved volume status.  ?Follow up renal function in am, avoid hypotension and nephrotoxic medications.  ? ?Essential hypertension ?Systolic blood pressure 90 to 100 mmHg ?Plan to continue close monitoring.  ? ?Rheumatoid arthritis ([place]) ?No active flare.  ?Follow up as outpatient.  ? ?Peripheral polyneuropathy ?Stable, continue medical therapy with gabapenitn.  ? ?Anemia ?Thrombocytopenia ?Likely anemia of chronic disease.  ? ? ?Class 1 obesity ?Calculated BMI is 31.5 ? ? ? ? ? ?  ? ?Subjective: Patient is feeling better, decreased 02 requirements.  ? ?Physical Exam: ?Vitals:  ? 05/31/21 0544 05/31/21 0547 05/31/21 0726 05/31/21 0736  ?BP: 117/61  100/60   ?Pulse: (!) 120  96 88  ?Resp:   19 17  ?Temp:   97.7 ?F (36.5 ?C)   ?TempSrc:   Oral   ?SpO2:  95% 92% 94%  ?Weight:      ?Height:      ? ?Neurology awake and alert ?ENT with no pallor ?Cardiovascular with S1 and S2 present irregularly irregular with positive 3/6 systolic murmur at the right sternal border ?No JVD ?Trace lower extremity edema ?Respiratory with scattered rales more at the right lower lobe with no wheezing ?Abdomen protuberant but not distended  ?Data Reviewed: ? ? ? ?Family Communication: no family at the bedside  ? ?Disposition: ?Status is: Inpatient ?Remains inpatient appropriate because: respiratory failure  ? Planned Discharge Destination: Home ? ? ? ?Author: ?Tawni Millers, MD ?05/31/2021 11:37 AM ? ?For on call review [email or web address].  ?

## 2021-05-31 NOTE — Progress Notes (Signed)
?Cardiology Progress Note  ?Patient ID: Jake [name] ?MRN: [id number] ?DOB: 1945/07/17 ?Date of Encounter: 05/31/2021 ? ?Primary Cardiologist: Elouise Munroe, MD ? ?Subjective  ? ?Chief Complaint: SOB ? ?HPI: Shortness of breath improving.  Blood pressure improving.  On treatment for pneumonia. ? ?ROS:  ?All other ROS reviewed and negative. Pertinent positives noted in the HPI.    ? ?Inpatient Medications  ?Scheduled Meds: ? budesonide (PULMICORT) nebulizer solution  0.25 mg Nebulization BID  ? folic acid  1 mg Oral Daily  ? gabapentin  300 mg Oral BID  ? magnesium oxide  400 mg Oral Daily  ? metoprolol tartrate  25 mg Oral BID  ? sulfaSALAzine  500 mg Oral Daily  ? cyanocobalamin  1,000 mcg Oral Daily  ? Warfarin - Pharmacist Dosing Inpatient   Does not apply [id number]  ? ?Continuous Infusions: ? ampicillin-sulbactam (UNASYN) IV 1.5 g (05/31/21 0525)  ? ?PRN Meds: ?acetaminophen **OR** acetaminophen, albuterol, guaiFENesin-dextromethorphan, magnesium hydroxide, ondansetron **OR** ondansetron (ZOFRAN) IV, traMADol, traZODone  ? ?Vital Signs  ? ?Vitals:  ? 05/31/21 0234 05/31/21 0300 05/31/21 0544 05/31/21 0547  ?BP: 103/64 (!) 106/54 117/61   ?Pulse: (!) 119 (!) 108 (!) 120   ?Resp: 15 20    ?Temp:  97.6 ?F (36.4 ?C)    ?TempSrc:  Axillary    ?SpO2: 95% 96%  95%  ?Weight:  99.7 kg    ?Height:      ? ? ?Intake/Output Summary (Last 24 hours) at 05/31/2021 0624 ?Last data filed at 05/30/2021 1855 ?Gross per 24 hour  ?Intake --  ?Output 2150 ml  ?Net -2150 ml  ? ? ?  05/31/2021  ?  3:00 AM 05/30/2021  ?  2:31 AM 05/29/2021  ?  3:00 AM  ?Last 3 Weights  ?Weight (lbs) 219 lb 12.8 oz 219 lb 9.3 oz 221 lb 9 oz  ?Weight (kg) 99.701 kg 99.6 kg 100.5 kg  ?   ? ?Telemetry  ?Overnight telemetry shows Afib 90-120 bpm, which I personally reviewed.  ? ?Physical Exam  ? ?Vitals:  ? 05/31/21 0234 05/31/21 0300 05/31/21 0544 05/31/21 0547  ?BP: 103/64 (!) 106/54 117/61   ?Pulse: (!) 119 (!) 108 (!) 120   ?Resp: 15 20    ?Temp:  97.6 ?F (36.4  ?C)    ?TempSrc:  Axillary    ?SpO2: 95% 96%  95%  ?Weight:  99.7 kg    ?Height:      ?  ?Intake/Output Summary (Last 24 hours) at 05/31/2021 0624 ?Last data filed at 05/30/2021 1855 ?Gross per 24 hour  ?Intake --  ?Output 2150 ml  ?Net -2150 ml  ?  ? ?  05/31/2021  ?  3:00 AM 05/30/2021  ?  2:31 AM 05/29/2021  ?  3:00 AM  ?Last 3 Weights  ?Weight (lbs) 219 lb 12.8 oz 219 lb 9.3 oz 221 lb 9 oz  ?Weight (kg) 99.701 kg 99.6 kg 100.5 kg  ?  Body mass index is 29 kg/m?.  ? ?General: Ill-appearing ?Head: Atraumatic, normal size  ?Eyes: PEERLA, EOMI  ?Neck: Supple, no JVD ?Endocrine: No thryomegaly ?Cardiac: Normal S1, S2; irregular rhythm, no murmurs ?Lungs: Diminished breath sounds bilaterally, diffuse rhonchi ?Abd: Soft, nontender, no hepatomegaly  ?Ext: No edema, pulses 2+ ?Musculoskeletal: No deformities, BUE and BLE strength normal and equal ?Skin: Warm and dry, no rashes   ?Neuro: Alert and oriented to person, place, time, and situation, CNII-XII grossly intact, no focal deficits  ?Psych: Normal mood and affect  ? ?  Labs  ?High Sensitivity Troponin:   ?Recent Labs  ?Lab 05/25/21 ?1721 05/25/21 ?1910 05/30/21 ?0101  ?TROPONINIHS 29* 29* 110*  ?   ?Cardiac EnzymesNo results for input(s): TROPONINI in the last 168 hours. No results for input(s): TROPIPOC in the last 168 hours.  ?Chemistry ?Recent Labs  ?Lab 05/25/21 ?1721 05/26/21 ?0110 05/30/21 ?0101 05/30/21 ?9528 05/31/21 ?4132  ?NA 137   < > 136 137 135  ?K 4.1   < > 4.7 5.0 4.2  ?CL 98   < > 89* 92* 93*  ?CO2 32   < > 35* 36* 38*  ?GLUCOSE 162*   < > 111* 131* 116*  ?BUN 27*   < > 43* 44* 41*  ?CREATININE 1.22   < > 1.99* 1.83* 1.40*  ?CALCIUM 8.2*   < > 8.7* 8.3* 8.4*  ?PROT 5.8*  --   --   --   --   ?ALBUMIN 3.2*  --   --   --   --   ?AST 19  --   --   --   --   ?ALT 20  --   --   --   --   ?ALKPHOS 42  --   --   --   --   ?BILITOT 1.0  --   --   --   --   ?GFRNONAA >60   < > 34* 38* 52*  ?ANIONGAP 7   < > 12 9 4*  ? < > = values in this interval not displayed.  ?   ?Hematology ?Recent Labs  ?Lab 05/29/21 ?4401 05/30/21 ?0101 05/31/21 ?0272  ?WBC 4.4 9.5 5.0  ?RBC 2.95* 3.14* 2.56*  ?HGB 10.1* 11.0* 8.7*  ?HCT 31.6* 33.4* 27.7*  ?MCV 107.1* 106.4* 108.2*  ?MCH 34.2* 35.0* 34.0  ?MCHC 32.0 32.9 31.4  ?RDW 15.0 15.0 14.9  ?PLT 129* 128* 119*  ? ?BNP ?Recent Labs  ?Lab 05/25/21 ?1721 05/30/21 ?0102  ?BNP 570.9* 570.3*  ?  ?DDimer  ?Recent Labs  ?Lab 05/30/21 ?0101  ?DDIMER 1.99*  ?  ? ?Radiology  ?CT CHEST WO CONTRAST ? ?Result Date: 05/30/2021 ?CLINICAL DATA:  Pneumonia. EXAM: CT CHEST WITHOUT CONTRAST TECHNIQUE: Multidetector CT imaging of the chest was performed following the standard protocol without IV contrast. RADIATION DOSE REDUCTION: This exam was performed according to the departmental dose-optimization program which includes automated exposure control, adjustment of the mA and/or kV according to patient size and/or use of iterative reconstruction technique. COMPARISON:  May 06, 2021. FINDINGS: Cardiovascular: Atherosclerosis of thoracic aorta is noted without aneurysm formation. Normal cardiac size. No pericardial effusion. Coronary artery calcifications are noted. Mediastinum/Nodes: No enlarged mediastinal or axillary lymph nodes. Thyroid gland, trachea, and esophagus demonstrate no significant findings. Lungs/Pleura: No pneumothorax is noted. Minimal right pleural effusion is noted. Extensive patchy airspace opacities are seen throughout both lower lobes most consistent with pneumonia. Some emphysematous changes noted as well. Upper Abdomen: No acute abnormality. Musculoskeletal: No chest wall mass or suspicious bone lesions identified. IMPRESSION: Interval development of extensive patchy airspace opacities throughout both lower lobes most consistent with pneumonia. Minimal right pleural effusion is noted. Coronary artery calcifications are noted suggesting coronary artery disease. Aortic Atherosclerosis (ICD10-I70.0) and Emphysema (ICD10-J43.9). Electronically  Signed   By: Marijo Conception M.D.   On: 05/30/2021 15:38  ? ?DG Chest Port 1 View ? ?Result Date: 05/29/2021 ?CLINICAL DATA:  Acute respiratory distress EXAM: PORTABLE CHEST 1 VIEW COMPARISON:  05/28/2021, 05/06/2021, 07/29/2019 FINDINGS: Mild cardiomegaly with  slight central congestion and mild diffuse interstitial edema. Interval development of more focal right greater than left lower lung airspace disease suspicious for pneumonia. No sizable effusion. Aortic atherosclerosis. No pneumothorax. IMPRESSION: 1. Mild cardiomegaly with slight central congestion and mild interstitial edema 2. Interval development of more confluent airspace disease in the right greater than left lower lung suspicious for pneumonia Electronically Signed   By: Donavan Foil M.D.   On: 05/29/2021 18:55   ? ?Cardiac Studies  ?TTE 05/26/2021 ? 1. Left ventricular ejection fraction, by estimation, is 60 to 65%. The  ?left ventricle has normal function. The left ventricle has no regional  ?wall motion abnormalities. There is moderate left ventricular hypertrophy.  ?Left ventricular diastolic function  ? could not be evaluated.  ? 2. Right ventricular systolic function is low normal. The right  ?ventricular size is moderately enlarged. There is moderately elevated  ?pulmonary artery systolic pressure. The estimated right ventricular  ?systolic pressure is 97.5 mmHg.  ? 3. Left atrial size was mildly dilated.  ? 4. Right atrial size was severely dilated.  ? 5. The mitral valve is abnormal. Trivial mitral valve regurgitation.  ? 6. The aortic valve was not well visualized. Aortic valve regurgitation  ?is not visualized. No aortic stenosis is present.  ? 7. Aortic dilatation noted. There is borderline dilatation of the aortic  ?root, measuring 39 mm.  ? 8. The inferior vena cava is dilated in size with <50% respiratory  ?variability, suggesting right atrial pressure of 15 mmHg.  ? ?Patient Profile  ?Boy Delamater is a 76 y.o. male with COPD, permanent  atrial fibrillation, ongoing tobacco abuse, HFpEF, hypertension who was admitted on 05/25/2021 with acute on chronic diastolic heart failure and A-fib with RVR.  Also here with COPD exacerbation. ? ?Assessment

## 2021-05-31 NOTE — Progress Notes (Signed)
Heart Failure Stewardship Pharmacist Progress Note ? ? ?PCP: Glendon Axe, MD ?PCP-Cardiologist: Elouise Munroe, MD  ? ? ?HPI:  ?76 yo M with PMH of CHF, cor pulmonale, CAD, HTN, afib, AAA, NSCLC s/p radiation, RA, CKD, tobacco use, and asthma/COPD. He presented to the ED on 4/5 with worsening LE edema, weight gain, dyspnea, and orthopnea. CXR with no evidence of acute cardiopulmonary disease. An ECHO was done on 4/6 and LVEF is 60-65% with moderate LVH and low normal RV function. Hospital course complicated by likely sepsis with PNA and significant AKI. ? ?Current HF Medications: ?Beta Blocker: metoprolol tartrate 25 mg BID ? ?Prior to admission HF Medications: ?Diuretic: furosemide 20 mg PRN ? ?Pertinent Lab Values: ?Serum creatinine 1.40, BUN 41, Potassium 4.2, Sodium 135, BNP 570.9 ? ?Vital Signs: ?Weight: 219 lbs (admission weight: 243 lbs) ?Blood pressure: 90-100/60s  ?Heart rate: 80s  ?I/O: -2.6L yesterday; net -17.9L ? ?Medication Assistance / Insurance Benefits Check: ?Does the patient have prescription insurance?  Yes ?Type of insurance plan: [employer] ? ?Outpatient Pharmacy:  ?[facility] ?Is the patient willing to use [facility] pharmacy at discharge? Yes ?Is the patient willing to transition their outpatient pharmacy to utilize a [facility] outpatient pharmacy?   Pending ?  ? ?Assessment: ?1. Acute on chronic diastolic CHF (EF 31-59%) due to multifactorial NICM (COPD, OSA, tobacco use, cor pulmonale). NYHA class II symptoms. ?- Off IV lasix - clinically euvolemic ?- Agree with adding metoprolol tartrate 25 mg BID ?- BP too soft for ACE/ARB/ARNI and with AKI ?- Off digoxin, spironolactone, and Jardiance with AKI ?  ?Plan: ?1) Medication changes recommended at this time: ?- Agree with changes as above ? ?2) Patient assistance: ?- $374.23 remaining on deductible ? ?3)  Education  ?- To be completed prior to discharge ? ?Kerby Nora, PharmD, BCPS ?Heart  Failure Stewardship Pharmacist ?Phone [phone] ? ? ?

## 2021-05-31 NOTE — TOC Progression Note (Addendum)
Transition of Care (TOC) - Progression Note  ? ? ?Patient Details  ?Name: Jake [name] ?MRN: [id number] ?Date of Birth: 1945/11/02 ? ?Transition of Care (TOC) CM/SW Contact  ?Angelita Ingles, RN ?Phone Number: ?05/31/2021, 8:49 AM ? ?Clinical Narrative:    ?CM has made several attempts to speak with patient. Patient has been very lethargic when CM at bedside. Unable to participate in conversation. CM has attempted to contact wife with no response. Will continue to follow.  ? ?25 CM spoke with daughter Jake [name] who confirms that patient is active with [facility]. CM called Malachy Mood with [facility] to verify that patient is active. Per Malachy Mood patient is active for RN /OT/PT. [facility] will continue to follow and needs to be notified when ready for discharge.  ? ? ?  ?  ? ?Expected Discharge Plan and Services ?  ?  ?  ?  ?  ?                ?  ?  ?  ?  ?  ?  ?  ?  ?  ?  ? ? ?Social Determinants of Health (SDOH) Interventions ?Food Insecurity Interventions: Intervention Not Indicated ?Financial Strain Interventions: Intervention Not Indicated ?Housing Interventions: Intervention Not Indicated ?Transportation Interventions: Intervention Not Indicated ? ?Readmission Risk Interventions ?   ? View : No data to display.  ?  ?  ?  ? ? ?

## 2021-05-31 NOTE — Progress Notes (Signed)
ANTICOAGULATION CONSULT NOTE  ? ?Pharmacy Consult for Warfarin  ?Indication: atrial fibrillation ? ?Allergies  ?Allergen Reactions  ? Tape Other (See Comments)  ?  Patient takes Coumadin!! Tape BRUISES and TEARS THE SKIN  ? Propoxyphene Nausea And Vomiting  ? ? ?Patient Measurements: ?Height: 6\' 1"  (185.4 cm) ?Weight: 99.7 kg (219 lb 12.8 oz) ?IBW/kg (Calculated) : 79.9 ?Vital Signs: ?Temp: 97.7 ?F (36.5 ?C) (04/11 7356) ?Temp Source: Oral (04/11 0726) ?BP: 100/60 (04/11 0726) ?Pulse Rate: 88 (04/11 0736) ? ?Labs: ?Recent Labs  ?  05/29/21 ?0053 05/30/21 ?0101 05/30/21 ?7014 05/31/21 ?1030  ?HGB 10.1* 11.0*  --  8.7*  ?HCT 31.6* 33.4*  --  27.7*  ?PLT 129* 128*  --  119*  ?LABPROT 22.0*  --  23.1* 28.6*  ?INR 2.0*  --  2.1* 2.7*  ?CREATININE 1.25* 1.99* 1.83* 1.40*  ?TROPONINIHS  --  110*  --   --   ? ? ? ?Estimated Creatinine Clearance: 54.9 mL/min (A) (by C-G formula based on SCr of 1.4 mg/dL (H)). ? ? ?Medical History: ?Past Medical History:  ?Diagnosis Date  ? Anemia   ? Asthma   ? Chronic diastolic (congestive) heart failure (HCC)   ? Chronic respiratory failure ([place])   ? CKD (chronic kidney disease) stage 2, GFR 60-89 ml/min   ? COPD (chronic obstructive pulmonary disease) ([place])   ? Cor pulmonale (HCC)   ? Coronary artery calcification   ? Coronary artery disease   ? Hepatitis A   ? Hypertension   ? Lung cancer ([place])   ? OSA (obstructive sleep apnea)   ? Permanent atrial fibrillation ([place])   ? Pulmonary hypertension ([place])   ? Rheumatoid arthritis ([place])   ? Thrombocytopenia ([place])   ? ? ? ?Assessment: ?76 y/o M with heart failure exacerbation. On warfarin PTA for afib. Pharmacy consulted for inpatient warfarin dosing.  ? ?Home warfarin regimen (per recent discharge from [facility]): warfarin 7.5 mg Mon/Wed/Fri/Sun and 5 mg all other days ? ?4/11 INR 2.7, therapeutic; However Unasyn started 4/10 which can increase INR - monitor closely ?No overt bleeding, CBC stable.  ? ?Goal of Therapy:  ?INR  2-3 ?Monitor platelets by anticoagulation protocol: Yes ?  ?Plan:  ?Warfarin 5 mg PO x 1 tonight (c/w home dose) ?Daily PT/INR ?Monitor for bleeding ? ?Thank you for allowing pharmacy to participate in this patient's care. ? ?Alanda Slim, PharmD, FCCM ?Clinical Pharmacist ?Please see AMION for all Pharmacists' Contact Phone Numbers ?05/31/2021, 8:02 AM  ? ? ? ? ? ? ?

## 2021-06-01 DIAGNOSIS — I482 Chronic atrial fibrillation, unspecified: Secondary | ICD-10-CM | POA: Diagnosis not present

## 2021-06-01 DIAGNOSIS — N179 Acute kidney failure, unspecified: Secondary | ICD-10-CM

## 2021-06-01 DIAGNOSIS — I5033 Acute on chronic diastolic (congestive) heart failure: Secondary | ICD-10-CM | POA: Diagnosis not present

## 2021-06-01 DIAGNOSIS — N189 Chronic kidney disease, unspecified: Secondary | ICD-10-CM

## 2021-06-01 DIAGNOSIS — J690 Pneumonitis due to inhalation of food and vomit: Secondary | ICD-10-CM | POA: Diagnosis not present

## 2021-06-01 LAB — BASIC METABOLIC PANEL
Anion gap: 8 (ref 5–15)
BUN: 35 mg/dL — ABNORMAL HIGH (ref 8–23)
CO2: 30 mmol/L (ref 22–32)
Calcium: 8.3 mg/dL — ABNORMAL LOW (ref 8.9–10.3)
Chloride: 96 mmol/L — ABNORMAL LOW (ref 98–111)
Creatinine, Ser: 1.22 mg/dL (ref 0.61–1.24)
GFR, Estimated: >60 mL/min (ref >60)
Glucose, Bld: 111 mg/dL — ABNORMAL HIGH (ref 70–99)
Potassium: 4.6 mmol/L (ref 3.5–5.1)
Sodium: 134 mmol/L — ABNORMAL LOW (ref 135–145)

## 2021-06-01 LAB — PROTIME-INR
INR: 2.4 — ABNORMAL HIGH (ref 0.8–1.2)
Prothrombin Time: 25.6 seconds — ABNORMAL HIGH (ref 11.4–15.2)

## 2021-06-01 MED ORDER — DILTIAZEM HCL ER COATED BEADS 120 MG PO CP24
120.0000 mg | ORAL_CAPSULE | Freq: Every day | ORAL | Status: DC
  Administered 2021-06-01 – 2021-06-06 (×6): 120 mg via ORAL
  Filled 2021-06-01 (×6): qty 1

## 2021-06-01 MED ORDER — WARFARIN SODIUM 7.5 MG PO TABS
7.5000 mg | ORAL_TABLET | Freq: Once | ORAL | Status: AC
  Administered 2021-06-01: 7.5 mg via ORAL
  Filled 2021-06-01: qty 1

## 2021-06-01 MED ORDER — FUROSEMIDE 20 MG PO TABS
20.0000 mg | ORAL_TABLET | Freq: Every day | ORAL | Status: DC
  Administered 2021-06-01 – 2021-06-02 (×2): 20 mg via ORAL
  Filled 2021-06-01 (×2): qty 1

## 2021-06-01 NOTE — Progress Notes (Signed)
ANTICOAGULATION CONSULT NOTE ? ?Pharmacy Consult for Warfarin ?Indication: atrial fibrillation ? ?Allergies  ?Allergen Reactions  ? Tape Other (See Comments)  ?  Patient takes Coumadin!! Tape BRUISES and TEARS THE SKIN  ? Propoxyphene Nausea And Vomiting  ? ? ?Patient Measurements: ?Height: 6\' 1"  (185.4 cm) ?Weight: 101.4 kg (223 lb 8.7 oz) ?IBW/kg (Calculated) : 79.9 ? ?Vital Signs: ?Temp: 98.5 ?F (36.9 ?C) (04/12 7062) ?Temp Source: Oral (04/12 3762) ?BP: 112/56 (04/12 0721) ?Pulse Rate: 102 (04/12 0828) ? ?Labs: ?Recent Labs  ?  05/30/21 ?0101 05/30/21 ?8315 05/31/21 ?1761 06/01/21 ?6073  ?HGB 11.0*  --  8.7*  --   ?HCT 33.4*  --  27.7*  --   ?PLT 128*  --  119*  --   ?LABPROT  --  23.1* 28.6* 25.6*  ?INR  --  2.1* 2.7* 2.4*  ?CREATININE 1.99* 1.83* 1.40* 1.22  ?TROPONINIHS 110*  --   --   --   ? ? ?Estimated Creatinine Clearance: 63.5 mL/min (by C-G formula based on SCr of 1.22 mg/dL). ? ? ?PTA Anticoagulation Clinic:  ?Home warfarin regimen (per recent discharge from [facility]): warfarin 7.5 mg Mon/Wed/Fri/Sun and 5 mg all other days ? ?Assessment: ?76 y/o M with heart failure exacerbation. On warfarin PTA for afib. Pharmacy consulted for inpatient warfarin dosing. PTA warfarin regimen resumed. INR therapeutic at 2.4. CBC stable, platelets 119 (4/11). No s/sx bleeding reported. ? ? ?Goal of Therapy:  ?INR 2-3 ?Monitor platelets by anticoagulation protocol: Yes ?  ?Plan:  ?Give warfarin 7.5 mg PO x1 dose ?Check INR daily while on warfarin ?Continue to monitor H&H and platelets ? ? ? ?Thank you for allowing pharmacy to be a part of this patient?s care. ? ?Ardyth Harps, PharmD ?Clinical Pharmacist ? ? ? ?

## 2021-06-01 NOTE — Progress Notes (Signed)
?Progress Note ? ? ?Patient: Jake [name] IZT:[id number] DOB: Jun 05, 1945 DOA: 05/25/2021     7 ?DOS: the patient was seen and examined on 06/01/2021 ?  ?Brief hospital course: ?Mr. Noteboom was admitted to the hospital with the working diagnosis of decompensated heart failure.  ?Hospitalization complicated with right lower lobe aspiration pneumonia and sepsis.  ? ?76 yo male with the past medical history of heart failure, COPD, hypertension, atrial fibrillation and asthma who presented with dyspnea and lower extremity edema. Reported worsening lower extremity edema for the last 6 weeks, he has gain 20 over last 2 weeks. Symptoms associated with dyspnea and 2 pillow orthopnea. On the day of admission home health nurse advice to come to the ED due worsening symptoms. On his initial physical examination his blood pressure is 101/57, HR 88, RR 16 and 02 saturation 92%, lungs with decreased breath sounds bilaterally, positive rales and wheezing, increased work of breathing, heart with S1 and S2 present and rhythmic, abdomen not distended and positive lower extremity edema ++.  ? ?Na 137, K 4,1 Cl 98, bicarbonate 32, glucose 162, bun 27, cr 1,2 ?BNP 570 ?Troponin 29-29  ?Wbc 6.3 hgb 10,0 hct 32, plt 91  ?INR 2,0  ? ?Chest radiograph with no cardiomegaly, bilateral interstitial infiltrates, small right pleural effusion.  ? ?EKG with 87 bpm, left axis deviation, normal intervals, atrial fibrillation with PVC, q wave V1 and V2 with no significant ST segment or T wave changes.  ? ?Patient was placed on IV furosemide for diuresis with good toleration. ?Had rapid ventricular response to atrial fibrillation and his AV blockade was adjusted with increase dose of diltiazem and metoprolol.  ? ?04/09 patient had worsening respiratory failure, with increase oxygen requirements. ?Chest film with new right lower lobe infiltrate, suspected aspiration pneumonia.  ?Started on antibiotic therapy.  ? ?04/11 improving oxygenation, with decreased  02 requirements from 30 L/min with 100% fi02 per heated high flow nasal cannula down to 15 L/min with 60% fi02.   ? ?04/12 continue to improve oxygenation with Fi02 down to 50%.  ? ? ?Assessment and Plan: ?* Acute on chronic diastolic CHF (congestive heart failure) ([place]) ?Echocardiogram with LV systolic function preserved EF 60 to 38%, RV systolic function preserved. Right atrial severe dilatation. (poor acoustic windows)  ? ?04/07 chest radiograph with bilateral interstitial infiltrates with hilar vascular congestion, positive hyperinflation. (personally reviewed).  ? ?04/09 chest film with improved pulmonary edema but new right lower lobe infiltrate.  ? ?Continue rate control atrial fibrillation. ?Continue resume diuresis with furosemide 20 mg to keep negative fluid balance.   ? ? ?Aspiration pneumonia ([place]) ?Acute on chronic hypoxemic respiratory failure. ?Severe sepsis not present on admission   ? ?Patient with no aspiration pneumonia, not present on admission.  ?Chest film/CT chest personally reviewed noted right lower lobe infiltrate.  ? ?Patient required short term non invasive mechanical ventilation and transitioned to heated high flow nasal cannula.  ?Today with decreased 02 requirements down to 60% Fi02 and 15 L/min per heated high flow nasal cannula.  ? ?Continue antibiotic therapy with Unasyn for aspiration pneumonia, will plan for 8 days.  ?Bronchodilator therapy ?Out of bed to chair tid with meals.  ?Airway clearing techniques with flutter valve and incentive spiometer.  ? ?Acute metabolic encephalopathy due to sepsis, he did received haloperidol one time.  ?Encephalopathy has resolved.  ? ?COPD with acute exacerbation ([place]) ?Continue bronchodilator therapy and inhaled corticosteroids.  ? ?A-fib ([place]) ?Home medications include metoprolol XL 50 mg and  diltiazem 180 mg. ? ?HR rate 80 to 90, atrial fibrillation rhythm. ?Continue rate control with diltiazem.  ?If persistent tachycardia will resume  metoprolol.  ? ?Continue anticoagulation with warfarin. ?INR is  2.4  ? ?Acute kidney injury superimposed on chronic kidney disease ([place]) ?CKD stage 2, Hyponatremia. ? ?Renal function with serum cr at 1,22 with K at 4.6 and serum bicarbonate at 30. ?Plan to continue holding diuretic therapy and follow up renal function in am. ?Avoid hypotension and nephrotoxic medications.  ? ?Essential hypertension ?Systolic blood pressure 295 to 140 mmHg ?Continue close monitoring, for now continue to hold on diuresis.  ? ?Rheumatoid arthritis ([place]) ?No active flare.  ?Follow up as outpatient.  ? ?Peripheral polyneuropathy ?Stable, continue medical therapy with gabapenitn.  ? ?Anemia ?Thrombocytopenia ?Likely anemia of chronic disease.  ? ? ?Class 1 obesity ?Calculated BMI is 31.5 ? ? ? ? ? ?  ? ?Subjective: Patient feeling better, dyspnea continue to improve but not back to baseline.  ? ? ?Physical Exam: ?Vitals:  ? 06/01/21 0721 06/01/21 0828 06/01/21 1140 06/01/21 1246  ?BP: (!) 112/56  (!) 144/74   ?Pulse: 96 (!) 102 (!) 102   ?Resp: 19 (!) 22 20   ?Temp: 98.5 ?F (36.9 ?C)  98.7 ?F (37.1 ?C)   ?TempSrc: Oral  Oral   ?SpO2: 93% 95% 94% 92%  ?Weight:      ?Height:      ? ?Neurology awake and alert ?ENT with mild pallor ?Cardiovascular with S1 and S2 present irregularly irregular with no gallops or rubs ?Neck no JVD ?No lower extremity edema ?Respiratory with bilateral lower lobes rales, no wheezing or rhonchi ?Abdomen not distended  ?Data Reviewed: ? ? ? ?Family Communication: no family at the bedside  ? ?Disposition: ?Status is: Inpatient ?Remains inpatient appropriate because: respiratory failure  ? Planned Discharge Destination: Home ? ? ?Author: ?Tawni Millers, MD ?06/01/2021 12:49 PM ? ?For on call review [email or web address].  ?

## 2021-06-01 NOTE — Progress Notes (Signed)
Occupational Therapy Treatment ?Patient Details ?Name: Jake [name] ?MRN: [id number] ?DOB: 1945-08-31 ?Today's Date: 06/01/2021 ? ? ?History of present illness Pt is a 76 y.o. M who presents 05/25/2021 with working diagnosis of decompensated heart failure. Significant PMH: heart failure, COPD, HTN, atrial fibrillation, asthma, peripheral polyneuropathy, RA. ?  ?OT comments ? Patient continues to make steady progress towards goals in skilled OT session. Patient's session encompassed  exercises seated EOB and sit<>stands with marching in place to increase overall activity tolerance. Patient now on 15L of HFNC, with levels ranging from 88-92% but would recover above 90% with seated rest breaks. HR noted to get to 127 with activity, but did not sustain. Continue to recommend AIR to progress towards prior level. OT will continue to follow acutely.   ? ?Recommendations for follow up therapy are one component of a multi-disciplinary discharge planning process, led by the attending physician.  Recommendations may be updated based on patient status, additional functional criteria and insurance authorization. ?   ?Follow Up Recommendations ? Acute inpatient rehab (3hours/day)  ?  ?Assistance Recommended at Discharge Intermittent Supervision/Assistance  ?Patient can return home with the following ? A little help with walking and/or transfers;A lot of help with bathing/dressing/bathroom;Assistance with cooking/housework;Assistance with feeding;Direct supervision/assist for medications management;Direct supervision/assist for financial management;Assist for transportation;Help with stairs or ramp for entrance ?  ?Equipment Recommendations ? None recommended by OT (Patient has DME needed)  ?  ?Recommendations for Other Services Rehab consult ? ?  ?Precautions / Restrictions Precautions ?Precautions: Fall;Other (comment) ?Precaution Comments: [place] ?Restrictions ?Weight Bearing Restrictions: No ?Other Position/Activity Restrictions:  watch O2 and BP  ? ? ?  ? ?Mobility Bed Mobility ?Overal bed mobility: Modified Independent ?  ?  ?  ?  ?  ?  ?General bed mobility comments: increased time ?  ? ?Transfers ?Overall transfer level: Needs assistance ?Equipment used: Rolling walker (2 wheels) ?Transfers: Sit to/from Stand ?Sit to Stand: Min assist ?  ?  ?  ?  ?  ?General transfer comment: min A to complete sit<>stands x3, minimal extra time needed ?  ?  ?Balance Overall balance assessment: Needs assistance ?Sitting-balance support: Bilateral upper extremity supported, Feet supported ?Sitting balance-Leahy Scale: Fair ?  ?  ?Standing balance support: Bilateral upper extremity supported, During functional activity, Reliant on assistive device for balance ?Standing balance-Leahy Scale: Poor ?Standing balance comment: reliant on RW ?  ?  ?  ?  ?  ?  ?  ?  ?  ?  ?  ?   ? ?ADL either performed or assessed with clinical judgement  ? ?ADL Overall ADL's : Needs assistance/impaired ?Eating/Feeding: Set up;Sitting ?  ?Grooming: Set up;Wash/dry hands;Wash/dry face;Sitting ?  ?  ?  ?  ?  ?  ?  ?  ?  ?Toilet Transfer: Minimal assistance;Ambulation;Rolling walker (2 wheels) ?Toilet Transfer Details (indicate cue type and reason): simluated with transfer to recliner, able to take a couple of steps (limited by HFNC) ?  ?  ?  ?  ?Functional mobility during ADLs: Cueing for sequencing;Cueing for safety;Rolling walker (2 wheels);Minimal assistance ?General ADL Comments: Patient continues to progress towards goals ?  ? ?Extremity/Trunk Assessment   ?  ?  ?  ?  ?  ? ?Vision   ?  ?  ?Perception   ?  ?Praxis   ?  ? ?Cognition Arousal/Alertness: Awake/alert ?Behavior During Therapy: [facility] for tasks assessed/performed, Flat affect ?Overall Cognitive Status: Within Functional Limits for tasks assessed ?  ?  ?  ?  ?  ?  ?  ?  ?  ?  ?  ?  ?  ?  ?  ?  ?  ?  ?  ?   ?  Exercises   ? ?  ?Shoulder Instructions   ? ? ?  ?General Comments    ? ? ?Pertinent Vitals/ Pain       Pain  Assessment ?Pain Assessment: Faces ?Faces Pain Scale: Hurts little more ?Pain Location: R hip ?Pain Descriptors / Indicators: Discomfort, Grimacing ?Pain Intervention(s): Limited activity within patient's tolerance, Monitored during session, Repositioned ? ?Home Living   ?  ?  ?  ?  ?  ?  ?  ?  ?  ?  ?  ?  ?  ?  ?  ?  ?  ?  ? ?  ?Prior Functioning/Environment    ?  ?  ?  ?   ? ?Frequency ? Min 3X/week  ? ? ? ? ?  ?Progress Toward Goals ? ?OT Goals(current goals can now be found in the care plan section) ? Progress towards OT goals: Progressing toward goals ? ?Acute Rehab OT Goals ?Patient Stated Goal: to get better ?OT Goal Formulation: With patient ?Time For Goal Achievement: 06/13/21 ?Potential to Achieve Goals: Good  ?Plan Discharge plan remains appropriate   ? ?Co-evaluation ? ? ?   ?  ?  ?  ?  ? ?  ?AM-PAC OT "6 Clicks" Daily Activity     ?Outcome Measure ? ? Help from another person eating meals?: A Little ?Help from another person taking care of personal grooming?: A Little ?Help from another person toileting, which includes using toliet, bedpan, or urinal?: A Lot ?Help from another person bathing (including washing, rinsing, drying)?: A Lot ?Help from another person to put on and taking off regular upper body clothing?: A Little ?Help from another person to put on and taking off regular lower body clothing?: A Lot ?6 Click Score: 15 ? ?  ?End of Session Equipment Utilized During Treatment: Rolling walker (2 wheels);Oxygen;Gait belt ? ?OT Visit Diagnosis: Unsteadiness on feet (R26.81);Other abnormalities of gait and mobility (R26.89);Muscle weakness (generalized) (M62.81) ?  ?Activity Tolerance Patient limited by fatigue ?  ?Patient Left in chair;with call bell/phone within reach ?  ?Nurse Communication Mobility status ?  ? ?   ? ?Time: 0160-1093 ?OT Time Calculation (min): 14 min ? ?Charges: OT General Charges ?$OT Visit: 1 Visit ?OT Treatments ?$Self Care/Home Management : 8-22 mins ? ?Corinne Ports E. Fionn Stracke,  OTR/L ?Acute Rehabilitation Services ?[phone] ?[phone]  ? ?Corinne Ports Ramonda Galyon ?06/01/2021, 12:52 PM ?

## 2021-06-01 NOTE — Progress Notes (Signed)
Heart Failure Stewardship Pharmacist Progress Note ? ? ?PCP: Glendon Axe, MD ?PCP-Cardiologist: Elouise Munroe, MD  ? ? ?HPI:  ?76 yo M with PMH of CHF, cor pulmonale, CAD, HTN, afib, AAA, NSCLC s/p radiation, RA, CKD, tobacco use, and asthma/COPD. He presented to the ED on 4/5 with worsening LE edema, weight gain, dyspnea, and orthopnea. CXR with no evidence of acute cardiopulmonary disease. An ECHO was done on 4/6 and LVEF is 60-65% with moderate LVH and low normal RV function. Hospital course complicated by PNA and significant AKI. ? ?Current HF Medications: ?Diuretic: furosemide 20 mg daily ? ?Prior to admission HF Medications: ?Diuretic: furosemide 20 mg PRN ? ?Pertinent Lab Values: ?Serum creatinine 1.22, BUN 35, Potassium 4.6, Sodium 134, BNP 570.9 ? ?Vital Signs: ?Weight: 223 lbs (admission weight: 243 lbs) ?Blood pressure: 110/60s  ?Heart rate: 90s  ?I/O: -1.5L yesterday; net -19.7L ? ?Medication Assistance / Insurance Benefits Check: ?Does the patient have prescription insurance?  Yes ?Type of insurance plan: [employer] Medicare ? ?Outpatient Pharmacy:  ?[facility] ?Is the patient willing to use [place] pharmacy at discharge? Yes ?Is the patient willing to transition their outpatient pharmacy to utilize a [facility] outpatient pharmacy?   Pending ?  ? ?Assessment: ?1. Acute on chronic diastolic CHF (EF 35-57%) due to multifactorial NICM (COPD, OSA, tobacco use, cor pulmonale). NYHA class II symptoms. ?- Agree with starting furosemide 20 mg daily ?- BP too soft for ACE/ARB/ARNI ?- Off digoxin, spironolactone, and Jardiance with AKI - improving ?  ?Plan: ?1) Medication changes recommended at this time: ?- Agree with changes as above ?- Consider restarting GDMT cautiously as AKI is improving ? ?2) Patient assistance: ?- $374.23 remaining on deductible ? ?3)  Education  ?- To be completed prior to discharge ? ?Kerby Nora, PharmD, BCPS ?Heart Failure Stewardship  Pharmacist ?Phone [phone] ? ? ?

## 2021-06-01 NOTE — Plan of Care (Signed)

## 2021-06-01 NOTE — Progress Notes (Signed)
?Cardiology Progress Note  ?Patient ID: Jake [name] ?MRN: [id number] ?DOB: 1945-08-02 ?Date of Encounter: 06/01/2021 ? ?Primary Cardiologist: Elouise Munroe, MD ? ?Subjective  ? ?Chief Complaint: SOB ? ?HPI: A-fib well controlled.  Still short of breath.  Being treated for pneumonia. ? ?ROS:  ?All other ROS reviewed and negative. Pertinent positives noted in the HPI.    ? ?Inpatient Medications  ?Scheduled Meds: ? budesonide (PULMICORT) nebulizer solution  0.25 mg Nebulization BID  ? folic acid  1 mg Oral Daily  ? gabapentin  300 mg Oral BID  ? magnesium oxide  400 mg Oral Daily  ? metoprolol tartrate  25 mg Oral BID  ? sulfaSALAzine  500 mg Oral Daily  ? cyanocobalamin  1,000 mcg Oral Daily  ? Warfarin - Pharmacist Dosing Inpatient   Does not apply Q3335  ? ?Continuous Infusions: ? ampicillin-sulbactam (UNASYN) IV Stopped (06/01/21 0123)  ? ?PRN Meds: ?acetaminophen **OR** acetaminophen, albuterol, guaiFENesin-dextromethorphan, magnesium hydroxide, ondansetron **OR** ondansetron (ZOFRAN) IV, traMADol, traZODone  ? ?Vital Signs  ? ?Vitals:  ? 05/31/21 1953 05/31/21 2352 06/01/21 0251 06/01/21 0324  ?BP: 115/61 (!) 101/59  109/65  ?Pulse: 94 90  90  ?Resp: 20 19  20   ?Temp: 98.5 ?F (36.9 ?C) 98.8 ?F (37.1 ?C)  98.5 ?F (36.9 ?C)  ?TempSrc: Oral Oral  Oral  ?SpO2: 91% 93% 94% 92%  ?Weight:      ?Height:      ? ? ?Intake/Output Summary (Last 24 hours) at 06/01/2021 0555 ?Last data filed at 06/01/2021 4562 ?Gross per 24 hour  ?Intake 710.44 ml  ?Output 2050 ml  ?Net -1339.56 ml  ? ? ?  05/31/2021  ?  3:00 AM 05/30/2021  ?  2:31 AM 05/29/2021  ?  3:00 AM  ?Last 3 Weights  ?Weight (lbs) 219 lb 12.8 oz 219 lb 9.3 oz 221 lb 9 oz  ?Weight (kg) 99.701 kg 99.6 kg 100.5 kg  ?   ? ?Telemetry  ?Overnight telemetry shows A-fib heart rate 90-100 bpm, which I personally reviewed.  ? ?Physical Exam  ? ?Vitals:  ? 05/31/21 1953 05/31/21 2352 06/01/21 0251 06/01/21 0324  ?BP: 115/61 (!) 101/59  109/65  ?Pulse: 94 90  90  ?Resp: 20 19  20    ?Temp: 98.5 ?F (36.9 ?C) 98.8 ?F (37.1 ?C)  98.5 ?F (36.9 ?C)  ?TempSrc: Oral Oral  Oral  ?SpO2: 91% 93% 94% 92%  ?Weight:      ?Height:      ?  ?Intake/Output Summary (Last 24 hours) at 06/01/2021 0555 ?Last data filed at 06/01/2021 5638 ?Gross per 24 hour  ?Intake 710.44 ml  ?Output 2050 ml  ?Net -1339.56 ml  ?  ? ?  05/31/2021  ?  3:00 AM 05/30/2021  ?  2:31 AM 05/29/2021  ?  3:00 AM  ?Last 3 Weights  ?Weight (lbs) 219 lb 12.8 oz 219 lb 9.3 oz 221 lb 9 oz  ?Weight (kg) 99.701 kg 99.6 kg 100.5 kg  ?  Body mass index is 29 kg/m?.  ?General: Well nourished, well developed, in no acute distress ?Head: Atraumatic, normal size  ?Eyes: PEERLA, EOMI  ?Neck: Supple, no JVD ?Endocrine: No thryomegaly ?Cardiac: Normal S1, S2; irregular rhythm, no murmurs ?Lungs: Diminished breath sounds bilaterally, rhonchi noted ?Abd: Soft, nontender, no hepatomegaly  ?Ext: No edema, pulses 2+ ?Musculoskeletal: No deformities, BUE and BLE strength normal and equal ?Skin: Warm and dry, no rashes   ?Neuro: Alert and oriented to person, place, time, and situation,  CNII-XII grossly intact, no focal deficits  ?Psych: Normal mood and affect  ? ?Labs  ?High Sensitivity Troponin:   ?Recent Labs  ?Lab 05/25/21 ?1721 05/25/21 ?1910 05/30/21 ?0101  ?TROPONINIHS 29* 29* 110*  ?   ?Cardiac EnzymesNo results for input(s): TROPONINI in the last 168 hours. No results for input(s): TROPIPOC in the last 168 hours.  ?Chemistry ?Recent Labs  ?Lab 05/25/21 ?1721 05/26/21 ?0110 05/30/21 ?1761 05/31/21 ?6073 06/01/21 ?7106  ?NA 137   < > 137 135 134*  ?K 4.1   < > 5.0 4.2 4.6  ?CL 98   < > 92* 93* 96*  ?CO2 32   < > 36* 38* 30  ?GLUCOSE 162*   < > 131* 116* 111*  ?BUN 27*   < > 44* 41* 35*  ?CREATININE 1.22   < > 1.83* 1.40* 1.22  ?CALCIUM 8.2*   < > 8.3* 8.4* 8.3*  ?PROT 5.8*  --   --   --   --   ?ALBUMIN 3.2*  --   --   --   --   ?AST 19  --   --   --   --   ?ALT 20  --   --   --   --   ?ALKPHOS 42  --   --   --   --   ?BILITOT 1.0  --   --   --   --   ?GFRNONAA  >60   < > 38* 52* >60  ?ANIONGAP 7   < > 9 4* 8  ? < > = values in this interval not displayed.  ?  ?Hematology ?Recent Labs  ?Lab 05/29/21 ?2694 05/30/21 ?0101 05/31/21 ?8546  ?WBC 4.4 9.5 5.0  ?RBC 2.95* 3.14* 2.56*  ?HGB 10.1* 11.0* 8.7*  ?HCT 31.6* 33.4* 27.7*  ?MCV 107.1* 106.4* 108.2*  ?MCH 34.2* 35.0* 34.0  ?MCHC 32.0 32.9 31.4  ?RDW 15.0 15.0 14.9  ?PLT 129* 128* 119*  ? ?BNP ?Recent Labs  ?Lab 05/25/21 ?1721 05/30/21 ?0102  ?BNP 570.9* 570.3*  ?  ?DDimer  ?Recent Labs  ?Lab 05/30/21 ?0101  ?DDIMER 1.99*  ?  ? ?Radiology  ?CT CHEST WO CONTRAST ? ?Result Date: 05/30/2021 ?CLINICAL DATA:  Pneumonia. EXAM: CT CHEST WITHOUT CONTRAST TECHNIQUE: Multidetector CT imaging of the chest was performed following the standard protocol without IV contrast. RADIATION DOSE REDUCTION: This exam was performed according to the departmental dose-optimization program which includes automated exposure control, adjustment of the mA and/or kV according to patient size and/or use of iterative reconstruction technique. COMPARISON:  May 06, 2021. FINDINGS: Cardiovascular: Atherosclerosis of thoracic aorta is noted without aneurysm formation. Normal cardiac size. No pericardial effusion. Coronary artery calcifications are noted. Mediastinum/Nodes: No enlarged mediastinal or axillary lymph nodes. Thyroid gland, trachea, and esophagus demonstrate no significant findings. Lungs/Pleura: No pneumothorax is noted. Minimal right pleural effusion is noted. Extensive patchy airspace opacities are seen throughout both lower lobes most consistent with pneumonia. Some emphysematous changes noted as well. Upper Abdomen: No acute abnormality. Musculoskeletal: No chest wall mass or suspicious bone lesions identified. IMPRESSION: Interval development of extensive patchy airspace opacities throughout both lower lobes most consistent with pneumonia. Minimal right pleural effusion is noted. Coronary artery calcifications are noted suggesting coronary  artery disease. Aortic Atherosclerosis (ICD10-I70.0) and Emphysema (ICD10-J43.9). Electronically Signed   By: Marijo Conception M.D.   On: 05/30/2021 15:38   ? ?Cardiac Studies  ?TTE 05/26/2021 ? 1. Left ventricular ejection fraction, by estimation, is  60 to 65%. The  ?left ventricle has normal function. The left ventricle has no regional  ?wall motion abnormalities. There is moderate left ventricular hypertrophy.  ?Left ventricular diastolic function  ? could not be evaluated.  ? 2. Right ventricular systolic function is low normal. The right  ?ventricular size is moderately enlarged. There is moderately elevated  ?pulmonary artery systolic pressure. The estimated right ventricular  ?systolic pressure is 62.2 mmHg.  ? 3. Left atrial size was mildly dilated.  ? 4. Right atrial size was severely dilated.  ? 5. The mitral valve is abnormal. Trivial mitral valve regurgitation.  ? 6. The aortic valve was not well visualized. Aortic valve regurgitation  ?is not visualized. No aortic stenosis is present.  ? 7. Aortic dilatation noted. There is borderline dilatation of the aortic  ?root, measuring 39 mm.  ? 8. The inferior vena cava is dilated in size with <50% respiratory  ?variability, suggesting right atrial pressure of 15 mmHg.  ? ?Patient Profile  ?Aws Shere is a 76 y.o. male with COPD, permanent atrial fibrillation, ongoing tobacco abuse, HFpEF, hypertension who was admitted on 05/25/2021 with acute on chronic diastolic heart failure and A-fib with RVR.  Also here with COPD exacerbation. ? ?Assessment & Plan  ? ?#Acute hypoxic respiratory failure ?#Sepsis ?#Possible aspiration pneumonia ?-Decompensation overnight for 05/30/2021.  CT chest with pneumonia. ?-Likely was over diuresed as well.  Treatment per hospital medicine. ? ?#Acute on chronic diastolic heart failure ?-Euvolemic on exam. ?-Net -1.3 L overnight.  Net -19.6 L since admission ?-We will start him on Lasix 20 mg daily. ? ?#Permanent atrial  fibrillation ?-Rates are much improved.  Stop metoprolol.  Transition back to home diltiazem extended release 120 daily. ?-On Coumadin. ? ?#AKI ?-Improving ? ?#Pneumonia ?-Per hospital medicine. ? ?[facility] will sign

## 2021-06-02 DIAGNOSIS — N179 Acute kidney failure, unspecified: Secondary | ICD-10-CM | POA: Diagnosis not present

## 2021-06-02 DIAGNOSIS — J690 Pneumonitis due to inhalation of food and vomit: Secondary | ICD-10-CM | POA: Diagnosis not present

## 2021-06-02 DIAGNOSIS — I482 Chronic atrial fibrillation, unspecified: Secondary | ICD-10-CM | POA: Diagnosis not present

## 2021-06-02 DIAGNOSIS — I5033 Acute on chronic diastolic (congestive) heart failure: Secondary | ICD-10-CM | POA: Diagnosis not present

## 2021-06-02 LAB — CBC
HCT: 27.2 % — ABNORMAL LOW (ref 39.0–52.0)
Hemoglobin: 8.8 g/dL — ABNORMAL LOW (ref 13.0–17.0)
MCH: 34.2 pg — ABNORMAL HIGH (ref 26.0–34.0)
MCHC: 32.4 g/dL (ref 30.0–36.0)
MCV: 105.8 fL — ABNORMAL HIGH (ref 80.0–100.0)
Platelets: 128 10*3/uL — ABNORMAL LOW (ref 150–400)
RBC: 2.57 MIL/uL — ABNORMAL LOW (ref 4.22–5.81)
RDW: 14.8 % (ref 11.5–15.5)
WBC: 4.5 10*3/uL (ref 4.0–10.5)
nRBC: 0 % (ref 0.0–0.2)

## 2021-06-02 LAB — BASIC METABOLIC PANEL
Anion gap: 8 (ref 5–15)
Anion gap: 7 (ref 5–15)
BUN: 27 mg/dL — ABNORMAL HIGH (ref 8–23)
BUN: 21 mg/dL (ref 8–23)
CO2: 30 mmol/L (ref 22–32)
CO2: 33 mmol/L — ABNORMAL HIGH (ref 22–32)
Calcium: 8.3 mg/dL — ABNORMAL LOW (ref 8.9–10.3)
Calcium: 8.2 mg/dL — ABNORMAL LOW (ref 8.9–10.3)
Chloride: 95 mmol/L — ABNORMAL LOW (ref 98–111)
Chloride: 94 mmol/L — ABNORMAL LOW (ref 98–111)
Creatinine, Ser: 1.15 mg/dL (ref 0.61–1.24)
Creatinine, Ser: 1.07 mg/dL (ref 0.61–1.24)
GFR, Estimated: >60 mL/min (ref >60)
GFR, Estimated: >60 mL/min (ref >60)
Glucose, Bld: 108 mg/dL — ABNORMAL HIGH (ref 70–99)
Glucose, Bld: 119 mg/dL — ABNORMAL HIGH (ref 70–99)
Potassium: 4.4 mmol/L (ref 3.5–5.1)
Potassium: 4.6 mmol/L (ref 3.5–5.1)
Sodium: 133 mmol/L — ABNORMAL LOW (ref 135–145)
Sodium: 134 mmol/L — ABNORMAL LOW (ref 135–145)

## 2021-06-02 LAB — PROTIME-INR
INR: 2.2 — ABNORMAL HIGH (ref 0.8–1.2)
Prothrombin Time: 23.8 seconds — ABNORMAL HIGH (ref 11.4–15.2)

## 2021-06-02 MED ORDER — FUROSEMIDE 10 MG/ML IJ SOLN: 40.0000 mg | Freq: Once | INTRAMUSCULAR | Status: DC

## 2021-06-02 MED ORDER — WARFARIN SODIUM 5 MG PO TABS
5.0000 mg | ORAL_TABLET | ORAL | Status: DC
  Administered 2021-06-02 – 2021-06-04 (×2): 5 mg via ORAL
  Filled 2021-06-02 (×2): qty 1

## 2021-06-02 MED ORDER — WARFARIN SODIUM 7.5 MG PO TABS
7.5000 mg | ORAL_TABLET | ORAL | Status: DC
  Administered 2021-06-03 – 2021-06-05 (×2): 7.5 mg via ORAL
  Filled 2021-06-02 (×2): qty 1

## 2021-06-02 MED ORDER — FUROSEMIDE 10 MG/ML IJ SOLN
40.0000 mg | Freq: Two times a day (BID) | INTRAMUSCULAR | Status: DC
  Administered 2021-06-02 – 2021-06-03 (×3): 40 mg via INTRAVENOUS
  Filled 2021-06-02 (×3): qty 4

## 2021-06-02 MED ORDER — METOPROLOL TARTRATE 50 MG PO TABS
50.0000 mg | ORAL_TABLET | Freq: Two times a day (BID) | ORAL | Status: DC
  Administered 2021-06-02 – 2021-06-11 (×19): 50 mg via ORAL
  Filled 2021-06-02 (×19): qty 1

## 2021-06-02 NOTE — Progress Notes (Signed)
Heart Failure Stewardship Pharmacist Progress Note ? ? ?PCP: Glendon Axe, MD ?PCP-Cardiologist: Elouise Munroe, MD  ? ? ?HPI:  ?76 yo M with PMH of CHF, cor pulmonale, CAD, HTN, afib, AAA, NSCLC s/p radiation, RA, CKD, tobacco use, and asthma/COPD. He presented to the ED on 4/5 with worsening LE edema, weight gain, dyspnea, and orthopnea. CXR with no evidence of acute cardiopulmonary disease. An ECHO was done on 4/6 and LVEF is 60-65% with moderate LVH and low normal RV function. Hospital course complicated by PNA and significant AKI. ? ?Current HF Medications: ?Diuretic: furosemide 20 mg daily ? ?Prior to admission HF Medications: ?Diuretic: furosemide 20 mg PRN ? ?Pertinent Lab Values: ?Serum creatinine 1.15, BUN 27, Potassium 4.4, Sodium 133, BNP 570.9 ? ?Vital Signs: ?Weight: 223 lbs (admission weight: 243 lbs) ?Blood pressure: 140/70s  ?Heart rate: 90-100s  ?I/O: -2.4L yesterday; net -21.8L ? ?Medication Assistance / Insurance Benefits Check: ?Does the patient have prescription insurance?  Yes ?Type of insurance plan: [employer] Medicare ? ?Outpatient Pharmacy:  ?[facility] ?Is the patient willing to use [place] pharmacy at discharge? Yes ?Is the patient willing to transition their outpatient pharmacy to utilize a [facility] outpatient pharmacy?   Pending ?  ? ?Assessment: ?1. Acute on chronic diastolic CHF (EF 27-06%) due to multifactorial NICM (COPD, OSA, tobacco use, cor pulmonale). NYHA class II symptoms. ?- Continue furosemide 20 mg daily ?- BP improved - consider starting spironolactone 12.5 mg daily ?- Consider restarting digoxin 0.25 mg daily for rate control since AKI resolved ?- Consider restarting Jardiance 10 mg daily prior to discharge if renal function allows ?  ?Plan: ?1) Medication changes recommended at this time: ?- Start spironolactone 12.5 mg daily ?- Start digoxin 0.25 mg daily ? ?2) Patient assistance: ?- $374.23 remaining on deductible ? ?3)   Education  ?- To be completed prior to discharge ? ?Kerby Nora, PharmD, BCPS ?Heart Failure Stewardship Pharmacist ?Phone [phone] ? ? ?

## 2021-06-02 NOTE — Progress Notes (Signed)
Physical Therapy Treatment ?Patient Details ?Name: Jake [name] ?MRN: [id number] ?DOB: Dec 13, 1945 ?Today's Date: 06/02/2021 ? ? ?History of Present Illness Pt is a 76 y.o. M who presents 05/25/2021 with working diagnosis of decompensated heart failure. Significant PMH: heart failure, COPD, HTN, atrial fibrillation, asthma, peripheral polyneuropathy, RA. ? ?  ?PT Comments  ? ? Pt received in supine, agreeable to therapy session and with good participation and tolerance for supine/seated exercises and fair tolerance for transfer training. Pt limited due to tachycardia with transfers and HR as high as 130 bpm with a few steps from bed>chair. SpO2 desat to 86% on 15L and FiO2 60% with exertion, improves with seated break and pursed-lip breathing to >90% within ~1 minute. BP stable with positional changes. Plan to progress standing tolerance and pre-gait tasks next session if HR/SpO2 more stable. Pt continues to benefit from PT services to progress toward functional mobility goals.    ?Recommendations for follow up therapy are one component of a multi-disciplinary discharge planning process, led by the attending physician.  Recommendations may be updated based on patient status, additional functional criteria and insurance authorization. ? ?Follow Up Recommendations ? Acute inpatient rehab (3hours/day) ?  ?  ?Assistance Recommended at Discharge PRN  ?Patient can return home with the following A little help with walking and/or transfers;A little help with bathing/dressing/bathroom;Assist for transportation;Assistance with cooking/housework ?  ?Equipment Recommendations ? None recommended by PT  ?  ?Recommendations for Other Services   ? ? ?  ?Precautions / Restrictions Precautions ?Precautions: Fall;Other (comment) ?Precaution Comments: HFNC ?Restrictions ?Weight Bearing Restrictions: No ?Other Position/Activity Restrictions: watch O2 and BP  ?  ? ?Mobility ? Bed Mobility ?Overal bed mobility: Needs Assistance ?Bed Mobility:  Supine to Sit ?  ?  ?Supine to sit: Supervision, HOB elevated ?  ?  ?General bed mobility comments: increased time, use of bed rail/features ?  ? ?Transfers ?Overall transfer level: Needs assistance ?Equipment used: Rolling walker (2 wheels) ?Transfers: Sit to/from Stand ?Sit to Stand: Min assist ?  ?Step pivot transfers: Min assist ?  ?  ?  ?General transfer comment: min A to complete sit<>stands from EOB and from recliner, minimal extra time needed, cues for technique; cues for posture and line awareness while pivoting from bed>chair; distance limited due to SpO2 desat and tachycardia ?  ? ?  ?Balance Overall balance assessment: Needs assistance ?Sitting-balance support: Bilateral upper extremity supported, Feet supported ?Sitting balance-Leahy Scale: Fair ?  ?  ?Standing balance support: Bilateral upper extremity supported, During functional activity, Reliant on assistive device for balance ?Standing balance-Leahy Scale: Poor ?Standing balance comment: reliant on RW ?  ?  ?  ? ?  ?Cognition Arousal/Alertness: Awake/alert ?Behavior During Therapy: [facility] for tasks assessed/performed, Flat affect ?Overall Cognitive Status: Within Functional Limits for tasks assessed ?  ?  ?General Comments: pleasantly cooperative; mild STM deficits but following 1 and 2-step commands well, pt reports he has rheumatoid arthritis ?  ?  ? ?  ?Exercises General Exercises - Lower Extremity ?Ankle Circles/Pumps: AROM, Both, 15 reps, Supine ?Long Arc Quad: AROM, Both, 10 reps, Seated ?Heel Slides: AROM, Both, 10 reps, Supine ?Hip ABduction/ADduction: AROM, Both, 10 reps, Supine ? ?  ?General Comments General comments (skin integrity, edema, etc.): BP 139/61 (81) seated; HR to 130 bpm with step pivot transfer to recliner; HR 106 bpm resting; Pt on 60% FiO2 15L HFNC ?  ?  ? ?Pertinent Vitals/Pain Pain Assessment ?Pain Assessment: Faces ?Faces Pain Scale: Hurts little more ?Pain Location: B  shoulders and hips with initial ROM (esp flexion),  after supine therex pain improved with transfer/standing ?Pain Descriptors / Indicators: Discomfort, Grimacing ?Pain Intervention(s): Monitored during session, Repositioned  ? ? ? ?PT Goals (current goals can now be found in the care plan section) Acute Rehab PT Goals ?Patient Stated Goal: get stronger ?PT Goal Formulation: With patient ?Time For Goal Achievement: 06/11/21 ?Progress towards PT goals: Progressing toward goals ? ?  ?Frequency ? ? ? Min 3X/week ? ? ? ?  ?PT Plan Current plan remains appropriate  ? ? ?   ?AM-PAC PT "6 Clicks" Mobility   ?Outcome Measure ? Help needed turning from your back to your side while in a flat bed without using bedrails?: None ?Help needed moving from lying on your back to sitting on the side of a flat bed without using bedrails?: A Little ?Help needed moving to and from a bed to a chair (including a wheelchair)?: A Little ?Help needed standing up from a chair using your arms (e.g., wheelchair or bedside chair)?: A Little ?Help needed to walk in hospital room?: Total ?Help needed climbing 3-5 steps with a railing? : Total ?6 Click Score: 15 ? ?  ?End of Session Equipment Utilized During Treatment: Gait belt;Oxygen ?Activity Tolerance: Patient tolerated treatment well;Treatment limited secondary to medical complications (Comment) (tachycardia/desat with transfer so defer gait progression) ?Patient left: in chair;with call bell/phone within reach (pt able to demo back use of call bell) ?Nurse Communication: Mobility status;Other (comment) (pt tachy and may need help to reposition after 1-2 hours) ?PT Visit Diagnosis: Unsteadiness on feet (R26.81);Difficulty in walking, not elsewhere classified (R26.2) ?  ? ? ?Time: 9758-8325 ?PT Time Calculation (min) (ACUTE ONLY): 39 min ? ?Charges:  $Therapeutic Exercise: 23-37 mins ?$Therapeutic Activity: 8-22 mins          ?          ? ?Travelle Mcclimans P., PTA ?Acute Rehabilitation Services ?Secure Chat Preferred 9a-5:30pm ?Office: [phone]   ? ? ?Danika Kluender M Jalonda Antigua ?06/02/2021, 1:17 PM ? ?

## 2021-06-02 NOTE — TOC Progression Note (Signed)
Transition of Care (TOC) - Progression Note  ? ? ?Patient Details  ?Name: Jake [name] ?MRN: [id number] ?Date of Birth: 30-Nov-1945 ? ?Transition of Care (TOC) CM/SW Contact  ?Angelita Ingles, RN ?Phone Number:[phone] ? ?06/02/2021, 2:31 PM ? ?Clinical Narrative:    ?CM made aware that patient is likely candidate for LTACH. CM spoke with daughter [name] who states that she is open to the idea but the final decision will be made by the patient. MD states that he will speak to patient and family for final determination. TOC will continue to follow.  ? ? ?  ?  ? ?Expected Discharge Plan and Services ?  ?  ?  ?  ?  ?                ?  ?  ?  ?  ?  ?  ?  ?  ?  ?  ? ? ?Social Determinants of Health (SDOH) Interventions ?Food Insecurity Interventions: Intervention Not Indicated ?Financial Strain Interventions: Intervention Not Indicated ?Housing Interventions: Intervention Not Indicated ?Transportation Interventions: Intervention Not Indicated ? ?Readmission Risk Interventions ?   ? View : No data to display.  ?  ?  ?  ? ? ?

## 2021-06-02 NOTE — Plan of Care (Signed)
?  Problem: Education: ?Goal: Ability to demonstrate management of disease process will improve ?Outcome: Progressing ?Goal: Ability to verbalize understanding of medication therapies will improve ?Outcome: Progressing ?Goal: Individualized Educational Video(s) ?Outcome: Progressing ?  ?Problem: Activity: ?Goal: Capacity to carry out activities will improve ?Outcome: Progressing ?  ?Problem: Cardiac: ?Goal: Ability to achieve and maintain adequate cardiopulmonary perfusion will improve ?Outcome: Progressing ?  ?Problem: Education: ?Goal: Knowledge of General Education information will improve ?Description: Including pain rating scale, medication(s)/side effects and non-pharmacologic comfort measures ?Outcome: Progressing ?  ?Problem: Health Behavior/Discharge Planning: ?Goal: Ability to manage health-related needs will improve ?Outcome: Progressing ?  ?Problem: Clinical Measurements: ?Goal: Ability to maintain clinical measurements within normal limits will improve ?Outcome: Progressing ?Goal: Will remain free from infection ?Outcome: Progressing ?Goal: Diagnostic test results will improve ?Outcome: Progressing ?Goal: Cardiovascular complication will be avoided ?Outcome: Progressing ?  ?Problem: Nutrition: ?Goal: Adequate nutrition will be maintained ?Outcome: Progressing ?  ?Problem: Coping: ?Goal: Level of anxiety will decrease ?Outcome: Progressing ?  ?Problem: Elimination: ?Goal: Will not experience complications related to bowel motility ?Outcome: Progressing ?Goal: Will not experience complications related to urinary retention ?Outcome: Progressing ?  ?Problem: Pain Managment: ?Goal: General experience of comfort will improve ?Outcome: Progressing ?  ?Problem: Safety: ?Goal: Ability to remain free from injury will improve ?Outcome: Progressing ?  ?Problem: Skin Integrity: ?Goal: Risk for impaired skin integrity will decrease ?Outcome: Progressing ?  ?Problem: Clinical Measurements: ?Goal: Respiratory  complications will improve ?Outcome: Not Progressing ?  ?Problem: Activity: ?Goal: Risk for activity intolerance will decrease ?Outcome: Not Progressing ?  ?

## 2021-06-02 NOTE — Progress Notes (Signed)
?Progress Note ? ? ?Patient: Jake [name] GGE:[id number] DOB: May 21, 1945 DOA: 05/25/2021     8 ?DOS: the patient was seen and examined on 06/02/2021 ?  ?Brief hospital course: ?Mr. Jake [name] was admitted to the hospital with the working diagnosis of decompensated heart failure.  ?Hospitalization complicated with right lower lobe aspiration pneumonia and sepsis.  ? ?76 yo male with the past medical history of heart failure, COPD, hypertension, atrial fibrillation and asthma who presented with dyspnea and lower extremity edema. Reported worsening lower extremity edema for the last 6 weeks, he has gain 20 over last 2 weeks. Symptoms associated with dyspnea and 2 pillow orthopnea. On the day of admission home health nurse advice to come to the ED due worsening symptoms. On his initial physical examination his blood pressure is 101/57, HR 88, RR 16 and 02 saturation 92%, lungs with decreased breath sounds bilaterally, positive rales and wheezing, increased work of breathing, heart with S1 and S2 present and rhythmic, abdomen not distended and positive lower extremity edema ++.  ? ?Na 137, K 4,1 Cl 98, bicarbonate 32, glucose 162, bun 27, cr 1,2 ?BNP 570 ?Troponin 29-29  ?Wbc 6.3 hgb 10,0 hct 32, plt 91  ?INR 2,0  ? ?Chest radiograph with no cardiomegaly, bilateral interstitial infiltrates, small right pleural effusion.  ? ?EKG with 87 bpm, left axis deviation, normal intervals, atrial fibrillation with PVC, q wave V1 and V2 with no significant ST segment or T wave changes.  ? ?Patient was placed on IV furosemide for diuresis with good toleration. ?Had rapid ventricular response to atrial fibrillation and his AV blockade was adjusted with increase dose of diltiazem and metoprolol.  ? ?04/09 patient had worsening respiratory failure, with increase oxygen requirements. ?Chest film with new right lower lobe infiltrate, suspected aspiration pneumonia.  ?Started on antibiotic therapy.  ? ?04/11 improving oxygenation, with decreased  02 requirements from 30 L/min with 100% fi02 per heated high flow nasal cannula down to 15 L/min with 60% fi02.   ? ?04/12 continue to improve oxygenation with Fi02 down to 50%.  ? ? ?Assessment and Plan: ?* Acute on chronic diastolic CHF (congestive heart failure) (Jake [name]) ?Echocardiogram with LV systolic function preserved EF 60 to 46%, RV systolic function preserved. Right atrial severe dilatation. (poor acoustic windows)  ? ?04/07 chest radiograph with bilateral interstitial infiltrates with hilar vascular congestion, positive hyperinflation. (personally reviewed).  ? ?04/09 chest film with improved pulmonary edema but new right lower lobe infiltrate.  ? ?Today patient with significant lower extremity edema. ?Continue to have high oxygen requirements.  ?Plan to change furosemide to 40 mg IV q12 hrs.  ?Resume metoprolol for heart rate control and continue with diltiazem.  ? ?Aspiration pneumonia (Jake [name]) ?Acute on chronic hypoxemic respiratory failure. ?Severe sepsis not present on admission   ? ?Patient with no aspiration pneumonia, not present on admission.  ?Chest film/CT chest personally reviewed noted right lower lobe infiltrate.  ? ?Patient required short term non invasive mechanical ventilation and transitioned to heated high flow nasal cannula.  ? ?On heated high flow nasal cannula with Fi02 50% and 15 L/min with 02 saturation 92%.  ? ?Continue antibiotic therapy with Unasyn for aspiration pneumonia, will plan for 8 days.  ?Bronchodilator therapy ?Out of bed to chair tid with meals.  ?Airway clearing techniques with flutter valve and incentive spiometer.  ? ?Wbc is 8.8 and he has been afebrile.  ? ?Acute metabolic encephalopathy due to sepsis, he did received haloperidol one time.  ?Encephalopathy has resolved.  ? ?  COPD with acute exacerbation (Jake [name]) ?Continue bronchodilator therapy and inhaled corticosteroids.  ? ?A-fib (Jake [name]) ?Home medications include metoprolol XL 50 mg and diltiazem 180 mg. ? ?Patient  continue to be on atrial fibrillation, personally reviewed telemetry. HR has been as high as 140 bpm and mostly in 100 range.  ? ?Plan to continue with diltiazem 120 mg daily and will resume metoprolol 50 mg po bid  ? ?Continue anticoagulation with warfarin per pharmacy protocol.  ?INR is  2.2  ? ?Acute kidney injury superimposed on chronic kidney disease (Jake [name]) ?CKD stage 2, Hyponatremia. ? ?Renal function with serum cr at 1,15 with K at 4,4 and Na 133. ?Positive volume overload.  ?Plan to increase furosemide to 40 mg IV q12 hrs.  ? ?Essential hypertension ?Systolic blood pressure 536 to 140 mmHg.  ?Plan to continue diuresis with furosemide, rate control of atrial fibrillation with diltiazem and metoprolol.  ? ?Rheumatoid arthritis (Jake [name]) ?No active flare.  ?Follow up as outpatient.  ? ?Peripheral polyneuropathy ?Stable, continue medical therapy with gabapenitn.  ? ?Anemia ?Thrombocytopenia ?Likely anemia of chronic disease.  ? ? ?Class 1 obesity ?Calculated BMI is 31.5 ? ? ? ? ? ?  ? ?Subjective: Patient with persistent dyspnea, very weak and deconditioned,.  ? ?Physical Exam: ?Vitals:  ? 06/02/21 0430 06/02/21 0727 06/02/21 0820 06/02/21 1238  ?BP:   139/80 (!) 124/58  ?Pulse:   (!) 111 100  ?Resp:   20 20  ?Temp:   98.1 ?F (36.7 ?C)   ?TempSrc:   Oral   ?SpO2:  92% 90% 90%  ?Weight: 101.5 kg     ?Height:      ? ?Neurology awake and alert ?ENT with no pallor ?Cardiovascular with S1 and S2 present, irregularly irregular, with no gallops or murmurs ?No JVD ?Positive lower extremity edema ++ pitting ?Respiratory with rales bilaterally ?Abdomen not distended  ?Data Reviewed: ? ? ? ?Family Communication: no family at the bedside  ? ?Disposition: ?Status is: Inpatient ?Remains inpatient appropriate because: heart failure  ? Planned Discharge Destination: LTAC ? ? ? ? ?Author: ?Tawni Millers, MD ?06/02/2021 12:46 PM ? ?For on call review [email or web address].  ?

## 2021-06-02 NOTE — Progress Notes (Signed)
ANTICOAGULATION CONSULT NOTE ? ?Pharmacy Consult for Warfarin ?Indication: atrial fibrillation ? ?Allergies  ?Allergen Reactions  ? Tape Other (See Comments)  ?  Patient takes Coumadin!! Tape BRUISES and TEARS THE SKIN  ? Propoxyphene Nausea And Vomiting  ? ? ?Patient Measurements: ?Height: 6\' 1"  (185.4 cm) ?Weight: 101.5 kg (223 lb 12.3 oz) ?IBW/kg (Calculated) : 79.9 ? ?Vital Signs: ?Temp: 98.1 ?F (36.7 ?C) (04/13 0820) ?Temp Source: Oral (04/13 0820) ?BP: 139/80 (04/13 0820) ?Pulse Rate: 111 (04/13 0820) ? ?Labs: ?Recent Labs  ?  05/31/21 ?3875 06/01/21 ?6433 06/02/21 ?2951  ?HGB 8.7*  --  8.8*  ?HCT 27.7*  --  27.2*  ?PLT 119*  --  128*  ?LABPROT 28.6* 25.6* 23.8*  ?INR 2.7* 2.4* 2.2*  ?CREATININE 1.40* 1.22 1.15  ? ? ? ?Estimated Creatinine Clearance: 67.3 mL/min (by C-G formula based on SCr of 1.15 mg/dL). ? ? ?PTA:  ?Home warfarin regimen (per recent discharge from [facility]): warfarin 7.5 mg Mon/Wed/Fri/Sun and 5 mg all other days ? ?Assessment: ?76 y/o M with heart failure exacerbation. On warfarin PTA for afib. Pharmacy consulted for inpatient warfarin dosing. INR therapeutic at 2.2. CBC stable, platelets up a little to 128. No s/sx bleeding noted. ?Resuming PTA warfarin regimen.  ? ? ?Goal of Therapy:  ?INR 2-3 ?Monitor platelets by anticoagulation protocol: Yes ?  ? ?Plan:  ?Give warfarin 7.5 mg PO on Sun/Mon/Wed/Fri and 5 mg on Tues/Thurs/Sat. ?Monitor daily INR, CBC, clinical course, s/sx of bleed, PO intake/diet, Drug-Drug Interactions ? ? ? ?Thank you for allowing [place] to participate in this patients care. ?Jens Som, PharmD ?06/02/2021 11:30 AM ? ?**Pharmacist phone directory can be found on [email or web address] listed under [place]** ? ? ? ? ?

## 2021-06-03 ENCOUNTER — Inpatient Hospital Stay (HOSPITAL_COMMUNITY): Payer: Medicare Other

## 2021-06-03 DIAGNOSIS — J690 Pneumonitis due to inhalation of food and vomit: Secondary | ICD-10-CM | POA: Diagnosis not present

## 2021-06-03 DIAGNOSIS — N179 Acute kidney failure, unspecified: Secondary | ICD-10-CM | POA: Diagnosis not present

## 2021-06-03 DIAGNOSIS — I5033 Acute on chronic diastolic (congestive) heart failure: Secondary | ICD-10-CM | POA: Diagnosis not present

## 2021-06-03 DIAGNOSIS — J441 Chronic obstructive pulmonary disease with (acute) exacerbation: Secondary | ICD-10-CM | POA: Diagnosis not present

## 2021-06-03 LAB — CBC WITH DIFFERENTIAL/PLATELET
Abs Immature Granulocytes: 0.06 10*3/uL (ref 0.00–0.07)
Basophils Absolute: 0 10*3/uL (ref 0.0–0.1)
Basophils Relative: 0 %
Eosinophils Absolute: 0 10*3/uL (ref 0.0–0.5)
Eosinophils Relative: 0 %
HCT: 25.1 % — ABNORMAL LOW (ref 39.0–52.0)
Hemoglobin: 8.1 g/dL — ABNORMAL LOW (ref 13.0–17.0)
Immature Granulocytes: 1 %
Lymphocytes Relative: 8 %
Lymphs Abs: 0.4 10*3/uL — ABNORMAL LOW (ref 0.7–4.0)
MCH: 34.3 pg — ABNORMAL HIGH (ref 26.0–34.0)
MCHC: 32.3 g/dL (ref 30.0–36.0)
MCV: 106.4 fL — ABNORMAL HIGH (ref 80.0–100.0)
Monocytes Absolute: 0.5 10*3/uL (ref 0.1–1.0)
Monocytes Relative: 10 %
Neutro Abs: 4.2 10*3/uL (ref 1.7–7.7)
Neutrophils Relative %: 81 %
Platelets: 142 10*3/uL — ABNORMAL LOW (ref 150–400)
RBC: 2.36 MIL/uL — ABNORMAL LOW (ref 4.22–5.81)
RDW: 15.2 % (ref 11.5–15.5)
WBC: 5.2 10*3/uL (ref 4.0–10.5)
nRBC: 0 % (ref 0.0–0.2)

## 2021-06-03 LAB — BASIC METABOLIC PANEL
Anion gap: 6 (ref 5–15)
BUN: 23 mg/dL (ref 8–23)
CO2: 32 mmol/L (ref 22–32)
Calcium: 8 mg/dL — ABNORMAL LOW (ref 8.9–10.3)
Chloride: 94 mmol/L — ABNORMAL LOW (ref 98–111)
Creatinine, Ser: 1.1 mg/dL (ref 0.61–1.24)
GFR, Estimated: >60 mL/min (ref >60)
Glucose, Bld: 127 mg/dL — ABNORMAL HIGH (ref 70–99)
Potassium: 4.3 mmol/L (ref 3.5–5.1)
Sodium: 132 mmol/L — ABNORMAL LOW (ref 135–145)

## 2021-06-03 LAB — POCT I-STAT 7, (LYTES, BLD GAS, ICA,H+H)
Acid-Base Excess: 12 mmol/L — ABNORMAL HIGH (ref 0.0–2.0)
Bicarbonate: 36.5 mmol/L — ABNORMAL HIGH (ref 20.0–28.0)
Calcium, Ion: 1.05 mmol/L — ABNORMAL LOW (ref 1.15–1.40)
HCT: 25 % — ABNORMAL LOW (ref 39.0–52.0)
Hemoglobin: 8.5 g/dL — ABNORMAL LOW (ref 13.0–17.0)
O2 Saturation: 88 %
Patient temperature: 99.2
Potassium: 4.1 mmol/L (ref 3.5–5.1)
Sodium: 131 mmol/L — ABNORMAL LOW (ref 135–145)
TCO2: 38 mmol/L — ABNORMAL HIGH (ref 22–32)
pCO2 arterial: 51.1 mmHg — ABNORMAL HIGH (ref 32–48)
pH, Arterial: 7.464 — ABNORMAL HIGH (ref 7.35–7.45)
pO2, Arterial: 55 mmHg — ABNORMAL LOW (ref 83–108)

## 2021-06-03 LAB — PROTIME-INR
INR: 2.2 — ABNORMAL HIGH (ref 0.8–1.2)
Prothrombin Time: 24.6 seconds — ABNORMAL HIGH (ref 11.4–15.2)

## 2021-06-03 MED ORDER — FUROSEMIDE 10 MG/ML IJ SOLN
40.0000 mg | Freq: Every day | INTRAMUSCULAR | Status: DC
  Administered 2021-06-04: 40 mg via INTRAVENOUS
  Filled 2021-06-03: qty 4

## 2021-06-03 NOTE — Progress Notes (Addendum)
OT Cancellation Note ? ?Patient Details ?Name: Jake [name] ?MRN: [id number] ?DOB: December 14, 1945 ? ? ?Cancelled Treatment:    Reason Eval/Treat Not Completed: Patient declined, no reason specified Patient eating lunch, requesting OT come back at a later time. OT will follow back as time permits.  ? ?Re-attempted at 1305, patient asleep but easily awoken. Patient declining activity and requesting to sleep.  ? ?Corinne Ports E. Tyliek Timberman, OTR/L ?Acute Rehabilitation Services ?[phone] ?[phone]  ? ?Corinne Ports Ankith Edmonston ?06/03/2021, 11:43 AM ?

## 2021-06-03 NOTE — Progress Notes (Signed)
Heart Failure Stewardship Pharmacist Progress Note ? ? ?PCP: Glendon Axe, MD ?PCP-Cardiologist: Elouise Munroe, MD  ? ? ?HPI:  ?76 yo M with PMH of CHF, cor pulmonale, CAD, HTN, afib, AAA, NSCLC s/p radiation, RA, CKD, tobacco use, and asthma/COPD. He presented to the ED on 4/5 with worsening LE edema, weight gain, dyspnea, and orthopnea. CXR with no evidence of acute cardiopulmonary disease. An ECHO was done on 4/6 and LVEF is 60-65% with moderate LVH and low normal RV function. Hospital course complicated by PNA and significant AKI. ? ?Current HF Medications: ?Diuretic: furosemide 40 mg IV BID ?Beta Blocker: metoprolol tartrate 50 mg BID ? ?Prior to admission HF Medications: ?Diuretic: furosemide 20 mg PRN ? ?Pertinent Lab Values: ?Serum creatinine 1.07, BUN 21, Potassium 4.6, Sodium 134, BNP 570.9 ? ?Vital Signs: ?Weight: 220 lbs (admission weight: 243 lbs) ?Blood pressure: 90-110/50s  ?Heart rate: 90s  ?I/O: -2.3L yesterday; net -21.9L ? ?Medication Assistance / Insurance Benefits Check: ?Does the patient have prescription insurance?  Yes ?Type of insurance plan: [employer] ? ?Outpatient Pharmacy:  ?[facility] ?Is the patient willing to use [facility] at discharge? Yes ?Is the patient willing to transition their outpatient pharmacy to utilize a [facility] outpatient pharmacy?   Pending ?  ? ?Assessment: ?1. Acute on chronic diastolic CHF (EF 62-83%) due to multifactorial NICM (COPD, OSA, tobacco use, cor pulmonale). NYHA class II symptoms. ?- Continue furosemide 40 mg IV BID ?- Continue metoprolol tartrate 50 mg BID ?- Consider restarting Jardiance 10 mg daily prior to discharge if renal function allows ?  ?Plan: ?1) Medication changes recommended at this time: ?- Start Jardiance 10 mg daily ? ?2) Patient assistance: ?- $374.23 remaining on deductible ? ?3)  Education  ?- To be completed prior to discharge ? ?Kerby Nora, PharmD, BCPS ?Heart Failure  Stewardship Pharmacist ?Phone [phone] ? ? ?

## 2021-06-03 NOTE — Progress Notes (Signed)
?Progress Note ? ? ?Patient: Aston Lieske TDH:[id number] DOB: 09-10-1945 DOA: 05/25/2021     9 ?DOS: the patient was seen and examined on 06/03/2021 ?  ?Brief hospital course: ?Mr. Cullers was admitted to the hospital with the working diagnosis of decompensated diastolic heart failure.  ?Hospitalization complicated with right lower lobe aspiration pneumonia and sepsis.  ? ?76 yo male with the past medical history of heart failure, COPD, hypertension, atrial fibrillation and asthma who presented with dyspnea and lower extremity edema. Reported worsening lower extremity edema for the last 6 weeks, he has gain 20 over last 2 weeks. Symptoms associated with dyspnea and 2 pillow orthopnea. On the day of admission home health nurse advice to come to the ED due worsening symptoms. On his initial physical examination his blood pressure is 101/57, HR 88, RR 16 and 02 saturation 92%, lungs with decreased breath sounds bilaterally, positive rales and wheezing, increased work of breathing, heart with S1 and S2 present and rhythmic, abdomen not distended and positive lower extremity edema ++.  ? ?Na 137, K 4,1 Cl 98, bicarbonate 32, glucose 162, bun 27, cr 1,2 ?BNP 570 ?Troponin 29-29  ?Wbc 6.3 hgb 10,0 hct 32, plt 91  ?INR 2,0  ? ?Chest radiograph with no cardiomegaly, bilateral interstitial infiltrates, small right pleural effusion.  ? ?EKG with 87 bpm, left axis deviation, normal intervals, atrial fibrillation with PVC, q wave V1 and V2 with no significant ST segment or T wave changes.  ? ?Patient was placed on IV furosemide for diuresis with good toleration. ?He had rapid ventricular response due to atrial fibrillation.   ?AV blockade was adjusted with diltiazem and metoprolol.  ? ?04/09 patient had worsening respiratory failure, with increase oxygen requirements. ?Chest film with new right lower lobe infiltrate, suspected aspiration pneumonia.  ?Started on antibiotic therapy.  ? ?04/11 improving oxygenation, with decreased 02  requirements from 30 L/min with 100% fi02 per heated high flow nasal.  ? ?04/14 chest film with right lower lobe infiltrate and left base atelectasis.  ?Positive congestion, with bilateral hilar vascular congestion.  ?Continue with high oxygen requirements.  ? ? ?Assessment and Plan: ?* Acute on chronic diastolic CHF (congestive heart failure) ([place]) ?Echocardiogram with LV systolic function preserved EF 60 to 64%, RV systolic function preserved. Right atrial severe dilatation. (poor acoustic windows)  ? ?04/07 chest radiograph with bilateral interstitial infiltrates with hilar vascular congestion, positive hyperinflation. (personally reviewed).  ? ?04/09 chest film with improved pulmonary edema but new right lower lobe infiltrate.  ? ?04/14 Chest radiograph with right lower lobe infiltrate and pulmonary edema.  ? ?Resumed diuresis with improvement in volume status. ?Urine output over last 24 hrs is 2,250 ml. ?Systolic blood pressure 99 to 102 mmHg.  ? ?Plan to continue diuresis with furosemide 40 mg daily ?Close blood pressure monitoring, continue with metoprolol and diltiazem.  ? ?Aspiration pneumonia ([place]) ?Acute on chronic hypoxemic respiratory failure. ?Severe sepsis not present on admission   ? ?Patient with aspiration pneumonia, not present on admission.  ?Chest film/CT chest personally reviewed noted right lower lobe infiltrate.  ? ?Patient required short term non invasive mechanical ventilation and transitioned to heated high flow nasal cannula.  ? ?Oxygenation today 96% with 15 L/min per heated high flow nasal cannula with Fi02 70%.  ?Blood cultures with no growth.  ? ?Antibiotic therapy with Unasyn for aspiration pneumonia, will plan for 8 days.  ?Continue with bronchodilator therapy ?Out of bed to chair tid with meals.  ?Airway clearing techniques with  flutter valve and incentive spiometer.  ? ?Continue wean off supplemental 02 to target 02 saturation 88% or greater.  ?Out of bed to chair and continue Pt  and Ot.  ?Follow cell count.  ? ?Acute metabolic encephalopathy due to sepsis, he did received haloperidol one time.  ?Encephalopathy has resolved.  ? ?COPD with acute exacerbation ([place]) ?Continue bronchodilator therapy and inhaled corticosteroids.  ? ?A-fib ([place]) ?Home medications include metoprolol XL 50 mg and diltiazem 180 mg. ? ?Persistent atrial fibrillation with occasional PVC. Rate has improved to 80 to 90. ?Continue rate control with metoprolol and diltiazem.  ? ?Anticoagulation with warfarin per pharmacy protocol.  ?INR is  2.2  ? ?Acute kidney injury superimposed on chronic kidney disease ([place]) ?CKD stage 2, Hyponatremia. ? ?Improved volume status.  ?Continue diuresis with furosemide 40 mg daily ?Check renal function today.  ? ?Essential hypertension ?Continue close blood pressure monitoring.  ?Continue diuresis with furosemide and rate control with metoprolol and diltiazem.  ? ?Rheumatoid arthritis ([place]) ?No active flare.  ?Follow up as outpatient.  ? ?Peripheral polyneuropathy ?Stable, continue medical therapy with gabapenitn.  ? ?Anemia ?Thrombocytopenia ?Likely anemia of chronic disease.  ? ? ?Class 1 obesity ?Calculated BMI is 31.5 ? ? ? ? ? ?  ? ?Subjective: Patient is feeling better, but continue with high oxygen requirements.  ? ?Physical Exam: ?Vitals:  ? 06/02/21 2335 06/03/21 0146 06/03/21 0454 06/03/21 0554  ?BP: (!) 110/58  (!) 99/57   ?Pulse: 76  80   ?Resp: 20  20   ?Temp: 99.5 ?F (37.5 ?C)  99.2 ?F (37.3 ?C)   ?TempSrc: Oral  Oral   ?SpO2: 95% 90% 94%   ?Weight:    99.8 kg  ?Height:      ? ?Neurology awake and alert ?ENT with mild pallor ?Cardiovascular with S1 and S2 present irregularly irregular with no gallops or murmurs, no rubs ?Respiratory with positive expiratory wheezing. Scattered rales ?Abdomen not distended ?Lower extremity edema + to ++ bilaterally  ?Data Reviewed: ? ? ? ?Family Communication: I spoke over the phone with the patient's daughter about patient's  condition, plan  of care, prognosis and all questions were addressed.  ? ?Disposition: ?Status is: Inpatient ?Remains inpatient appropriate because: high oxygen requirements  ? Planned Discharge Destination: LTAC when patient more stable for his cardio pulmonary and renal state.  ? ? ? ? ?Author: ?Tawni Millers, MD ?06/03/2021 9:34 AM ? ?For on call review [email or web address].  ?

## 2021-06-03 NOTE — Plan of Care (Signed)
?  Problem: Education: ?Goal: Knowledge of General Education information will improve ?Description: Including pain rating scale, medication(s)/side effects and non-pharmacologic comfort measures ?Outcome: Progressing ?  ?Problem: Activity: ?Goal: Risk for activity intolerance will decrease ?Outcome: Progressing ?  ?Problem: Nutrition: ?Goal: Adequate nutrition will be maintained ?Outcome: Progressing ?  ?Problem: Coping: ?Goal: Level of anxiety will decrease ?Outcome: Progressing ?  ?Problem: Elimination: ?Goal: Will not experience complications related to urinary retention ?Outcome: Progressing ?  ?Problem: Pain Managment: ?Goal: General experience of comfort will improve ?Outcome: Progressing ?  ?Problem: Safety: ?Goal: Ability to remain free from injury will improve ?Outcome: Progressing ?  ?

## 2021-06-03 NOTE — Progress Notes (Signed)
Mobility Specialist Progress Note ? ? 06/03/21 1545  ?Mobility  ?Activity Refused mobility  ? ?Receive pt alert and awake upon arrival but deferring mobility session, after max encouragement, d/t over whelming fatigue. MS will follow up tomorrow if time permits.  ? ?Holland Falling ?Mobility Specialist ?Phone Number [phone] ? ?

## 2021-06-03 NOTE — Progress Notes (Signed)
ANTICOAGULATION CONSULT NOTE - Follow Up Consult ? ?Pharmacy Consult for Warfarin ?Indication: atrial fibrillation ? ?Allergies  ?Allergen Reactions  ? Tape Other (See Comments)  ?  Patient takes Coumadin!! Tape BRUISES and TEARS THE SKIN  ? Propoxyphene Nausea And Vomiting  ? ? ?Patient Measurements: ?Height: 6\' 1"  (185.4 cm) ?Weight: 99.8 kg (220 lb 0.3 oz) ?IBW/kg (Calculated) : 79.9 ? ?Vital Signs: ?Temp: 99.2 ?F (37.3 ?C) (04/14 0454) ?Temp Source: Oral (04/14 0454) ?BP: 99/57 (04/14 0454) ?Pulse Rate: 80 (04/14 0454) ? ?Labs: ?Recent Labs  ?  06/01/21 ?3825 06/02/21 ?0539 06/02/21 ?1334 06/03/21 ?0052 06/03/21 ?0946  ?HGB  --  8.8*  --   --  8.1*  ?HCT  --  27.2*  --   --  25.1*  ?PLT  --  128*  --   --  142*  ?LABPROT 25.6* 23.8*  --  24.6*  --   ?INR 2.4* 2.2*  --  2.2*  --   ?CREATININE 1.22 1.15 1.07  --  1.10  ? ? ?Estimated Creatinine Clearance: 69.9 mL/min (by C-G formula based on SCr of 1.1 mg/dL). ? ?Assessment: ?76 y/o M admitted 05/25/21 with heart failure exacerbation. Continues on warfarin as PTA for afib. Pharmacy consulted for inpatient warfarin dosing. INR remains therapeutic at 2.2. Hgb has trended down to 8.1, platelet count improved to 142. No bleeding noted. ? ?Goal of Therapy:  ?INR 2-3 ?Monitor platelets by anticoagulation protocol: Yes ?  ?Plan:  ?Continue Warfarin 7.5 mg MWFSun (due today) and 5 mg TTSat. ?Daily PT/INR for now.  Decrease to less frequent INR checks if remains stable. ?Monitor for bleeding.  ? ?Arty Baumgartner, RPh ?06/03/2021,12:10 PM ? ? ?

## 2021-06-04 DIAGNOSIS — I5033 Acute on chronic diastolic (congestive) heart failure: Secondary | ICD-10-CM | POA: Diagnosis not present

## 2021-06-04 LAB — CBC
HCT: 25.1 % — ABNORMAL LOW (ref 39.0–52.0)
Hemoglobin: 7.9 g/dL — ABNORMAL LOW (ref 13.0–17.0)
MCH: 33.6 pg (ref 26.0–34.0)
MCHC: 31.5 g/dL (ref 30.0–36.0)
MCV: 106.8 fL — ABNORMAL HIGH (ref 80.0–100.0)
Platelets: 143 10*3/uL — ABNORMAL LOW (ref 150–400)
RBC: 2.35 MIL/uL — ABNORMAL LOW (ref 4.22–5.81)
RDW: 15.3 % (ref 11.5–15.5)
WBC: 5 10*3/uL (ref 4.0–10.5)
nRBC: 0 % (ref 0.0–0.2)

## 2021-06-04 LAB — PROTIME-INR
INR: 2.5 — ABNORMAL HIGH (ref 0.8–1.2)
Prothrombin Time: 26.4 seconds — ABNORMAL HIGH (ref 11.4–15.2)

## 2021-06-04 LAB — IRON AND TIBC
Iron: 24 ug/dL — ABNORMAL LOW (ref 45–182)
Saturation Ratios: 9 % — ABNORMAL LOW (ref 17.9–39.5)
TIBC: 260 ug/dL (ref 250–450)
UIBC: 236 ug/dL

## 2021-06-04 LAB — RETICULOCYTES
Immature Retic Fract: 33.4 % — ABNORMAL HIGH (ref 2.3–15.9)
RBC.: 2.52 MIL/uL — ABNORMAL LOW (ref 4.22–5.81)
Retic Count, Absolute: 93.2 10*3/uL (ref 19.0–186.0)
Retic Ct Pct: 3.7 % — ABNORMAL HIGH (ref 0.4–3.1)

## 2021-06-04 LAB — BASIC METABOLIC PANEL
Anion gap: 8 (ref 5–15)
BUN: 27 mg/dL — ABNORMAL HIGH (ref 8–23)
CO2: 31 mmol/L (ref 22–32)
Calcium: 7.8 mg/dL — ABNORMAL LOW (ref 8.9–10.3)
Chloride: 95 mmol/L — ABNORMAL LOW (ref 98–111)
Creatinine, Ser: 1.14 mg/dL (ref 0.61–1.24)
GFR, Estimated: >60 mL/min (ref >60)
Glucose, Bld: 133 mg/dL — ABNORMAL HIGH (ref 70–99)
Potassium: 3.9 mmol/L (ref 3.5–5.1)
Sodium: 134 mmol/L — ABNORMAL LOW (ref 135–145)

## 2021-06-04 LAB — FERRITIN: Ferritin: 307 ng/mL (ref 24–336)

## 2021-06-04 LAB — CULTURE, BLOOD (ROUTINE X 2)
Culture: NO GROWTH
Culture: NO GROWTH
Special Requests: ADEQUATE
Special Requests: ADEQUATE

## 2021-06-04 LAB — VITAMIN B12: Vitamin B-12: 773 pg/mL (ref 180–914)

## 2021-06-04 LAB — FOLATE: Folate: 38.3 ng/mL (ref >5.9)

## 2021-06-04 MED ORDER — METHYLPREDNISOLONE SODIUM SUCC 125 MG IJ SOLR
120.0000 mg | INTRAMUSCULAR | Status: DC
  Administered 2021-06-04 – 2021-06-06 (×3): 120 mg via INTRAVENOUS
  Filled 2021-06-04 (×3): qty 2

## 2021-06-04 NOTE — Progress Notes (Signed)
ANTICOAGULATION CONSULT NOTE - Follow Up Consult ? ?Pharmacy Consult for Warfarin ?Indication: atrial fibrillation ? ?Allergies  ?Allergen Reactions  ? Tape Other (See Comments)  ?  Patient takes Coumadin!! Tape BRUISES and TEARS THE SKIN  ? Propoxyphene Nausea And Vomiting  ? ? ?Patient Measurements: ?Height: 6\' 1"  (185.4 cm) ?Weight: 100 kg (220 lb 7.4 oz) ?IBW/kg (Calculated) : 79.9 ? ?Vital Signs: ?Temp: 98.4 ?F (36.9 ?C) (04/15 1119) ?Temp Source: Oral (04/15 1119) ?BP: 102/60 (04/15 1119) ?Pulse Rate: 76 (04/15 1119) ? ?Labs: ?Recent Labs  ?  06/02/21 ?9628 06/02/21 ?1334 06/03/21 ?0052 06/03/21 ?3662 06/03/21 ?1526 06/04/21 ?0100  ?HGB 8.8*  --   --  8.1* 8.5* 7.9*  ?HCT 27.2*  --   --  25.1* 25.0* 25.1*  ?PLT 128*  --   --  142*  --  143*  ?LABPROT 23.8*  --  24.6*  --   --  26.4*  ?INR 2.2*  --  2.2*  --   --  2.5*  ?CREATININE 1.15 1.07  --  1.10  --  1.14  ? ? ? ?Estimated Creatinine Clearance: 67.5 mL/min (by C-G formula based on SCr of 1.14 mg/dL). ? ?Assessment: ?76 y/o M admitted 05/25/21 with heart failure exacerbation. Continues on warfarin as PTA for afib. Pharmacy consulted for inpatient warfarin dosing. INR remains therapeutic at 2.5. Hgb has trended down to 7.9, platelet count stable at 143. No bleeding noted. ? ?Goal of Therapy:  ?INR 2-3 ?Monitor platelets by anticoagulation protocol: Yes ?  ?Plan:  ?Continue Warfarin 7.5 mg MWFSun and 5 mg TTSat. ?Daily PT/INR for now.  Decrease to less frequent INR checks if remains stable. ?Monitor for bleeding.  ? ?Aleene Davidson, RPh ?06/04/2021,2:46 PM ? ? ?

## 2021-06-04 NOTE — Plan of Care (Signed)

## 2021-06-04 NOTE — Progress Notes (Signed)
?PROGRESS NOTE ? ? ? ?Jake [name]  ION:[id number] DOB: 1945-09-29 DOA: 05/25/2021 ?PCP: Glendon Axe, MD  ?Narrative 75/M with history of chronic diastolic CHF, cor pulmonale, heavy smoker, severe COPD on 3 to 4 L home O2, CAD, HTN, afib, AAA, NSCLC s/p radiation, RA, CKD2,   presented to the ED on 4/5 with worsening LE edema, weight gain, dyspnea, and orthopnea. CXR with no evidence of acute cardiopulmonary disease. An ECHO was done on 4/6 and LVEF is 60-65% with moderate LVH and low normal RV function.  ?-Hospital course complicated by right lower lobe aspiration pneumonia and sepsis ?-Had significant worsening oxygen requirement this admission up to 20 L high flow, 80% FiO2 ? ? ?Subjective: ?-Tired but slowly improving ? ?Assessment and Plan: ? ? ?Acute hypoxic respiratory failure ?-Currently requiring 80% FiO2, 20 L high flow  ?-secondary to acute on chronic diastolic CHF, aspiration pneumonia and COPD exacerbation in the background of advanced COPD, chronic respiratory failure ?-Continue IV Lasix today, adding IV steroids, continue DuoNebs, flutter valve, pulmonary toilet ?-Complete antibiotic course of Unasyn ?-Wean O2 down aggressively as tolerated, out of bed to chair, increase activity ? ?Acute on chronic diastolic CHF ?-Echo with LV systolic function preserved EF 60 to 65%, Right atrial severe dilatation.  ?-Volume status improving, diuresed with IV Lasix, he is 23.8 L negative, weight down 26 lbs ?-Continue IV Lasix, metoprolol and diltiazem  ? ?Aspiration pneumonia ([place]) ?Sepsis, not poa ?-Developed aspiration pneumonia this admission ?Chest film/CT chest personally reviewed noted right lower lobe infiltrate.  ?-Wean O2 as tolerated, SLP evaluation completed ?-Remains on IV Unasyn, day 4/7 ?-Flutter valve, incentive spirometry ? ?COPD with acute exacerbation ([place]) ?-Wheezing today, likely COPD exacerbation in the setting of aspiration pneumonia, add IV steroids, continue antibiotics as above and  DuoNebs ? ?A-fib ([place]) ?-Continue rate control with metoprolol and diltiazem.  ?-Continue warfarin for anticoagulation ? ?Acute metabolic encephalopathy ?-In the setting of sepsis, aspiration pneumonia, resolved ? ?Tobacco abuse ?-Heavy smoker, smoked 3 ppd for 50+ years, now down to 0.5 PPD ?-Counseled ? ?Acute kidney injury superimposed on chronic kidney disease ([place]) ?CKD stage 2, Hyponatremia. ?-AKI has resolved, continue diuretics ? ?Essential hypertension ?Continue close blood pressure monitoring.  ?Continue diuresis with furosemide and rate control with metoprolol and diltiazem.  ? ?Rheumatoid arthritis ([place]) ?-Stable ? ?Peripheral polyneuropathy ?Stable, continue medical therapy with gabapenitn.  ? ?Anemia ?Thrombocytopenia ?Likely anemia of chronic disease, check anemia panel ? ? ?Class 1 obesity ?Calculated BMI is 31.5 ? ?DVT prophylaxis: Warfarin ?Code Status: Full code ?Family Communication: Discussed with patient in detail, no family at bedside ?Disposition Plan: Home pending improvement in oxygenation ? ?Procedures:  ? ?Antimicrobials:  ? ? ?Objective: ?Vitals:  ? 06/04/21 2440 06/04/21 0601 06/04/21 0824 06/04/21 0834  ?BP: (!) 97/49  124/67   ?Pulse: 80  89   ?Resp: 17     ?Temp: 98.7 ?F (37.1 ?C)  98.3 ?F (36.8 ?C)   ?TempSrc: Oral  Oral   ?SpO2: 94%  (!) 88% 93%  ?Weight:  100 kg    ?Height:      ? ? ?Intake/Output Summary (Last 24 hours) at 06/04/2021 1021 ?Last data filed at 06/04/2021 0532 ?Gross per 24 hour  ?Intake 189 ml  ?Output 2100 ml  ?Net -1911 ml  ? ?Filed Weights  ? 06/02/21 0430 06/03/21 0554 06/04/21 0601  ?Weight: 101.5 kg 99.8 kg 100 kg  ? ? ?Examination: ? ?General exam: Chronically ill male sitting up in bed, AAO x3, no distress ?  HEENT: Positive JVD ?CVS: S1-S2, regular rhythm ?Lungs: Fine expiratory wheezes, poor air movement ?Abdomen: Soft, nontender, bowel sounds present ?Extremities: Trace edema  ?Skin: No rashes ?Psychiatry: Mood & affect appropriate.  ? ? ? ?Data Reviewed:   ? ?CBC: ?Recent Labs  ?Lab 05/30/21 ?0101 05/31/21 ?0623 06/02/21 ?7628 06/03/21 ?3151 06/03/21 ?1526 06/04/21 ?0100  ?WBC 9.5 5.0 4.5 5.2  --  5.0  ?NEUTROABS 8.6*  --   --  4.2  --   --   ?HGB 11.0* 8.7* 8.8* 8.1* 8.5* 7.9*  ?HCT 33.4* 27.7* 27.2* 25.1* 25.0* 25.1*  ?MCV 106.4* 108.2* 105.8* 106.4*  --  106.8*  ?PLT 128* 119* 128* 142*  --  143*  ? ?Basic Metabolic Panel: ?Recent Labs  ?Lab 05/29/21 ?7616 05/30/21 ?0101 06/01/21 ?0737 06/02/21 ?1062 06/02/21 ?1334 06/03/21 ?6948 06/03/21 ?1526 06/04/21 ?0100  ?NA 136   < > 134* 133* 134* 132* 131* 134*  ?K 4.1   < > 4.6 4.4 4.6 4.3 4.1 3.9  ?CL 91*   < > 96* 95* 94* 94*  --  95*  ?CO2 38*   < > 30 30 33* 32  --  31  ?GLUCOSE 101*   < > 111* 108* 119* 127*  --  133*  ?BUN 37*   < > 35* 27* 21 23  --  27*  ?CREATININE 1.25*   < > 1.22 1.15 1.07 1.10  --  1.14  ?CALCIUM 8.6*   < > 8.3* 8.3* 8.2* 8.0*  --  7.8*  ?MG 1.6*  --   --   --   --   --   --   --   ? < > = values in this interval not displayed.  ? ?GFR: ?Estimated Creatinine Clearance: 67.5 mL/min (by C-G formula based on SCr of 1.14 mg/dL). ?Liver Function Tests: ?No results for input(s): AST, ALT, ALKPHOS, BILITOT, PROT, ALBUMIN in the last 168 hours. ?No results for input(s): LIPASE, AMYLASE in the last 168 hours. ?No results for input(s): AMMONIA in the last 168 hours. ?Coagulation Profile: ?Recent Labs  ?Lab 05/31/21 ?5462 06/01/21 ?7035 06/02/21 ?0093 06/03/21 ?0052 06/04/21 ?0100  ?INR 2.7* 2.4* 2.2* 2.2* 2.5*  ? ?Cardiac Enzymes: ?No results for input(s): CKTOTAL, CKMB, CKMBINDEX, TROPONINI in the last 168 hours. ?BNP (last 3 results) ?No results for input(s): PROBNP in the last 8760 hours. ?HbA1C: ?No results for input(s): HGBA1C in the last 72 hours. ?CBG: ?Recent Labs  ?Lab 05/29/21 ?1833  ?GLUCAP 142*  ? ?Lipid Profile: ?No results for input(s): CHOL, HDL, LDLCALC, TRIG, CHOLHDL, LDLDIRECT in the last 72 hours. ?Thyroid Function Tests: ?No results for input(s): TSH, T4TOTAL, FREET4, T3FREE,  THYROIDAB in the last 72 hours. ?Anemia Panel: ?No results for input(s): VITAMINB12, FOLATE, FERRITIN, TIBC, IRON, RETICCTPCT in the last 72 hours. ?Urine analysis: ?   ?Component Value Date/Time  ? [place] YELLOW 07/22/2019 1854  ? APPEARANCEUR CLEAR 07/22/2019 1854  ? LABSPEC 1.020 07/22/2019 1854  ? PHURINE 5.0 07/22/2019 1854  ? [place] NEGATIVE 07/22/2019 1854  ? [place] NEGATIVE 07/22/2019 1854  ? BILIRUBINUR SMALL (A) 07/22/2019 1854  ? [place] Stain NEGATIVE 07/22/2019 1854  ? [place] NEGATIVE 07/22/2019 1854  ? NITRITE NEGATIVE 07/22/2019 1854  ? LEUKOCYTESUR NEGATIVE 07/22/2019 1854  ? ?Sepsis Labs: ?@[email or web address] (procalcitonin:4,lacticidven:4) ? ?) ?Recent Results (from the past 240 hour(s))  ?MRSA Next Gen by PCR, Nasal     Status: None  ? Collection Time: 05/25/21 11:15 PM  ? Specimen: Nasal Mucosa; Nasal Swab  ?Result Value  Ref Range Status  ? MRSA by PCR Next Gen NOT DETECTED NOT DETECTED Final  ?  Comment: (NOTE) ?The GeneXpert MRSA Assay (FDA approved for NASAL specimens only), ?is one component of a comprehensive MRSA colonization surveillance ?program. It is not intended to diagnose MRSA infection nor to guide ?or monitor treatment for MRSA infections. ?Test performance is not FDA approved in patients less than 2 years ?old. ?Performed at [facility], [place] [address], [place], [place] ?[address] ?  ?Resp Panel by RT-PCR (Flu A&B, Covid) Nasopharyngeal Swab     Status: None  ? Collection Time: 05/29/21  9:50 PM  ? Specimen: Nasopharyngeal Swab; Nasopharyngeal(NP) swabs in vial transport medium  ?Result Value Ref Range Status  ? SARS Coronavirus 2 by RT PCR NEGATIVE NEGATIVE Final  ?  Comment: (NOTE) ?SARS-CoV-2 target nucleic acids are NOT DETECTED. ? ?The SARS-CoV-2 RNA is generally detectable in upper respiratory ?specimens during the acute phase of infection. The lowest ?concentration of SARS-CoV-2 viral copies this assay can detect is ?138 copies/mL. A negative result does not preclude  SARS-Cov-2 ?infection and should not be used as the sole basis for treatment or ?other patient management decisions. A negative result may occur with  ?improper specimen collection/handling, submission of specimen other ?

## 2021-06-05 DIAGNOSIS — I5033 Acute on chronic diastolic (congestive) heart failure: Secondary | ICD-10-CM | POA: Diagnosis not present

## 2021-06-05 LAB — BASIC METABOLIC PANEL
Anion gap: 6 (ref 5–15)
Anion gap: 10 (ref 5–15)
BUN: 27 mg/dL — ABNORMAL HIGH (ref 8–23)
BUN: 27 mg/dL — ABNORMAL HIGH (ref 8–23)
CO2: 33 mmol/L — ABNORMAL HIGH (ref 22–32)
CO2: 30 mmol/L (ref 22–32)
Calcium: 7.9 mg/dL — ABNORMAL LOW (ref 8.9–10.3)
Calcium: 8.3 mg/dL — ABNORMAL LOW (ref 8.9–10.3)
Chloride: 92 mmol/L — ABNORMAL LOW (ref 98–111)
Chloride: 91 mmol/L — ABNORMAL LOW (ref 98–111)
Creatinine, Ser: 1.05 mg/dL (ref 0.61–1.24)
Creatinine, Ser: 1.03 mg/dL (ref 0.61–1.24)
GFR, Estimated: >60 mL/min (ref >60)
GFR, Estimated: >60 mL/min (ref >60)
Glucose, Bld: 154 mg/dL — ABNORMAL HIGH (ref 70–99)
Glucose, Bld: 175 mg/dL — ABNORMAL HIGH (ref 70–99)
Potassium: 4 mmol/L (ref 3.5–5.1)
Potassium: 4 mmol/L (ref 3.5–5.1)
Sodium: 131 mmol/L — ABNORMAL LOW (ref 135–145)
Sodium: 131 mmol/L — ABNORMAL LOW (ref 135–145)

## 2021-06-05 LAB — CBC
HCT: 23.5 % — ABNORMAL LOW (ref 39.0–52.0)
Hemoglobin: 7.7 g/dL — ABNORMAL LOW (ref 13.0–17.0)
MCH: 34.4 pg — ABNORMAL HIGH (ref 26.0–34.0)
MCHC: 32.8 g/dL (ref 30.0–36.0)
MCV: 104.9 fL — ABNORMAL HIGH (ref 80.0–100.0)
Platelets: 157 10*3/uL (ref 150–400)
RBC: 2.24 MIL/uL — ABNORMAL LOW (ref 4.22–5.81)
RDW: 14.7 % (ref 11.5–15.5)
WBC: 5.2 10*3/uL (ref 4.0–10.5)
nRBC: 0 % (ref 0.0–0.2)

## 2021-06-05 LAB — PROTIME-INR
INR: 2.8 — ABNORMAL HIGH (ref 0.8–1.2)
Prothrombin Time: 29.5 seconds — ABNORMAL HIGH (ref 11.4–15.2)

## 2021-06-05 MED ORDER — FUROSEMIDE 10 MG/ML IJ SOLN
40.0000 mg | Freq: Two times a day (BID) | INTRAMUSCULAR | Status: DC
  Administered 2021-06-05 – 2021-06-06 (×4): 40 mg via INTRAVENOUS
  Filled 2021-06-05 (×4): qty 4

## 2021-06-05 MED ORDER — SODIUM CHLORIDE 0.9 % IV SOLN
250.0000 mg | Freq: Every day | INTRAVENOUS | Status: DC
  Administered 2021-06-05 – 2021-06-06 (×2): 250 mg via INTRAVENOUS
  Filled 2021-06-05 (×2): qty 20

## 2021-06-05 NOTE — Progress Notes (Signed)
ANTICOAGULATION CONSULT NOTE - Follow Up Consult ? ?Pharmacy Consult for Warfarin ?Indication: atrial fibrillation ? ?Allergies  ?Allergen Reactions  ? Tape Other (See Comments)  ?  Patient takes Coumadin!! Tape BRUISES and TEARS THE SKIN  ? Propoxyphene Nausea And Vomiting  ? ? ?Patient Measurements: ?Height: 6\' 1"  (185.4 cm) ?Weight: 100.9 kg (222 lb 7.1 oz) ?IBW/kg (Calculated) : 79.9 ? ?Vital Signs: ?Temp: 97.5 ?F (36.4 ?C) (04/16 1208) ?Temp Source: Oral (04/16 1208) ?BP: 100/75 (04/16 1208) ?Pulse Rate: 94 (04/16 1208) ? ?Labs: ?Recent Labs  ?  06/03/21 ?0052 06/03/21 ?6063 06/03/21 ?0160 06/03/21 ?1526 06/04/21 ?0100 06/05/21 ?0329 06/05/21 ?1200  ?HGB  --  8.1*   < > 8.5* 7.9* 7.7*  --   ?HCT  --  25.1*   < > 25.0* 25.1* 23.5*  --   ?PLT  --  142*  --   --  143* 157  --   ?LABPROT 24.6*  --   --   --  26.4* 29.5*  --   ?INR 2.2*  --   --   --  2.5* 2.8*  --   ?CREATININE  --  1.10   < >  --  1.14 1.05 1.03  ? < > = values in this interval not displayed.  ? ? ? ?Estimated Creatinine Clearance: 75 mL/min (by C-G formula based on SCr of 1.03 mg/dL). ? ?Assessment: ?76 y/o M admitted 05/25/21 with heart failure exacerbation. Continues on warfarin as PTA for afib. Pharmacy consulted for inpatient warfarin dosing. INR remains therapeutic at 2.8. CBC stable. No bleeding noted. ? ?Unasyn completed 4/16, which may increase INR. Will leave on PTA dose for now as has been stable. If INR continues to increase may need to cut back slightly.  ? ?Goal of Therapy:  ?INR 2-3 ?Monitor platelets by anticoagulation protocol: Yes ?  ?Plan:  ?Continue Warfarin 7.5 mg MWFSun and 5 mg TTSat. ?Daily PT/INR for now.   ?Decrease to less frequent INR checks if remains stable. ?Monitor for bleeding.  ? ?Aleene Davidson, RPh ?06/05/2021,3:03 PM ? ? ?

## 2021-06-05 NOTE — Progress Notes (Addendum)
?PROGRESS NOTE ? ? ? 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 CXR with no evidence of acute cardiopulmonary disease. An ECHO was done on 4/6 and LVEF is 60-65% with moderate LVH and low normal RV function.  ?-Hospital course complicated by right lower lobe aspiration pneumonia and sepsis ?-Had significant worsening oxygen requirement this admission up to 20 L high flow, 80% FiO2 ? ? ?Subjective: ?-Feels better overall, breathing improving ? ?Assessment and Plan: ? ? ?Acute hypoxic respiratory failure ?-Currently requiring 80% FiO2, 20 L high flow  ?-secondary to acute on chronic diastolic CHF, aspiration pneumonia and COPD exacerbation in the background of advanced COPD, chronic respiratory failure ?-Continue IV Lasix today ?-Still wheezing, started on IV steroids yesterday, continue duo nebs, pulmonary toilet, flutter valve ?-Completes 7-day course of Unasyn today ?-Wean O2 down aggressively as tolerated, out of bed to chair, increase activity ? ?Acute on chronic diastolic CHF ?-Echo with LV systolic function preserved EF 60 to 65%, Right atrial severe dilatation.  ?-Volume status improving, diuresed with IV Lasix, he is 23.6 L negative, weight down 26 lbs ?-Continue IV Lasix, metoprolol and diltiazem  ? ?Aspiration pneumonia ([place]) ?Sepsis, not poa ?-Developed aspiration pneumonia this admission ?Chest film/CT chest personally reviewed noted right lower lobe infiltrate.  ?-Wean O2 as tolerated, SLP evaluation completed ?-Discontinue IV Unasyn, day 7 today ?-Flutter valve, incentive spirometry ? ?COPD with acute exacerbation ([place]) ?-Wheezing today, likely COPD exacerbation in the setting of aspiration pneumonia,   ?-See discussion above ? ?A-fib ([place]) ?-Continue rate control with metoprolol and diltiazem.  ?-Continue warfarin for anticoagulation ? ?Iron deficiency anemia ?-Add IV iron, trend hemoglobin, denies any overt blood loss ? ?Acute metabolic encephalopathy ?-In the setting of sepsis, aspiration pneumonia, resolved ? ?Tobacco abuse ?-Heavy smoker, smoked 3 ppd for 50+ years, now down to 0.5 PPD ?-Counseled ? ?Acute kidney injury superimposed on chronic kidney disease ([place]) ?CKD stage 2, Hyponatremia. ?-AKI has resolved, continue diuretics ? ?Essential hypertension ?Continue close blood pressure monitoring.  ?Continue diuresis with furosemide and rate control with metoprolol and diltiazem.  ? ?Rheumatoid arthritis ([place]) ?-Stable ? ?Peripheral polyneuropathy ?Stable, continue medical therapy with gabapenitn.  ? ? ?Class 1 obesity ?Calculated BMI is 31.5 ? ?DVT prophylaxis: Warfarin ?Code Status: Full code ?Family Communication: Discussed with patient in detail, no family at bedside ?Disposition Plan: Home pending improvement in oxygenation ? ?Procedures:  ? ?Antimicrobials:  ? ? ?Objective: ?Vitals:  ? 06/05/21 0313 06/05/21 0749 06/05/21 0829 06/05/21 0858  ?BP:  115/65 114/60 121/70  ?Pulse: 80 91 86 96  ?Resp: (!) 22 (!) 22 (!) 21 (!) 23  ?Temp:   97.8 ?F (36.6 ?C) 98 ?F (36.7 ?C)  ?TempSrc:   Oral Oral  ?SpO2: 96% 92% 92% 93%  ?Weight:      ?Height:      ? ? ?Intake/Output Summary (Last 24 hours) at 06/05/2021 1102 ?Last data filed at 06/05/2021 0800 ?Gross per 24 hour  ?Intake 1200 ml  ?Output 1000 ml  ?Net 200 ml  ? ?Filed Weights  ? 06/03/21 0554 06/04/21 0601 06/04/21 2323  ?Weight: 99.8 kg 100 kg 100.9 kg  ? ? ?Examination: ? ?General exam: Pleasant chronically ill male sitting  up in bed, AAOx3, no distress ?HEENT: Positive JVD ?CVS: S1-S2, regular rhythm ?Lungs: Fine expiratory wheezes, poor air movement ?Abdomen: Soft, nontender, bowel sounds present ?Extremities: Trace edema  ?Skin: No rashes ?Psychiatry: Mood  & affect appropriate.  ? ? ? ?Data Reviewed:  ? ?CBC: ?Recent Labs  ?Lab 05/30/21 ?0101 05/31/21 ?7782 06/02/21 ?4235 06/03/21 ?3614 06/03/21 ?1526 06/04/21 ?0100 06/05/21 ?0329  ?WBC 9.5 5.0 4.5 5.2  --  5.0 5.2  ?NEUTROABS 8.6*  --   --  4.2  --   --   --   ?HGB 11.0* 8.7* 8.8* 8.1* 8.5* 7.9* 7.7*  ?HCT 33.4* 27.7* 27.2* 25.1* 25.0* 25.1* 23.5*  ?MCV 106.4* 108.2* 105.8* 106.4*  --  106.8* 104.9*  ?PLT 128* 119* 128* 142*  --  143* 157  ? ?Basic Metabolic Panel: ?Recent Labs  ?Lab 06/02/21 ?4315 06/02/21 ?1334 06/03/21 ?4008 06/03/21 ?1526 06/04/21 ?0100 06/05/21 ?0329  ?NA 133* 134* 132* 131* 134* 131*  ?K 4.4 4.6 4.3 4.1 3.9 4.0  ?CL 95* 94* 94*  --  95* 92*  ?CO2 30 33* 32  --  31 33*  ?GLUCOSE 108* 119* 127*  --  133* 154*  ?BUN 27* 21 23  --  27* 27*  ?CREATININE 1.15 1.07 1.10  --  1.14 1.05  ?CALCIUM 8.3* 8.2* 8.0*  --  7.8* 7.9*  ? ?GFR: ?Estimated Creatinine Clearance: 73.6 mL/min (by C-G formula based on SCr of 1.05 mg/dL). ?Liver Function Tests: ?No results for input(s): AST, ALT, ALKPHOS, BILITOT, PROT, ALBUMIN in the last 168 hours. ?No results for input(s): LIPASE, AMYLASE in the last 168 hours. ?No results for input(s): AMMONIA in the last 168 hours. ?Coagulation Profile: ?Recent Labs  ?Lab 06/01/21 ?6761 06/02/21 ?9509 06/03/21 ?0052 06/04/21 ?0100 06/05/21 ?0329  ?INR 2.4* 2.2* 2.2* 2.5* 2.8*  ? ?Cardiac Enzymes: ?No results for input(s): CKTOTAL, CKMB, CKMBINDEX, TROPONINI in the last 168 hours. ?BNP (last 3 results) ?No results for input(s): PROBNP in the last 8760 hours. ?HbA1C: ?No results for input(s): HGBA1C in the last 72 hours. ?CBG: ?Recent Labs  ?Lab 05/29/21 ?1833  ?GLUCAP 142*  ? ?Lipid Profile: ?No results for input(s): CHOL, HDL, LDLCALC, TRIG, CHOLHDL, LDLDIRECT in the last 72 hours. ?Thyroid Function Tests: ?No results for input(s): TSH, T4TOTAL, FREET4, T3FREE, THYROIDAB in the last 72 hours. ?Anemia Panel: ?Recent Labs  ?  06/04/21 ?1116  ?VITAMINB12 773  ?FOLATE 38.3  ?FERRITIN  307  ?TIBC 260  ?IRON 24*  ?RETICCTPCT 3.7*  ? ?Urine analysis: ?   ?Component Value Date/Time  ? [place] YELLOW 07/22/2019 1854  ? APPEARANCEUR CLEAR 07/22/2019 1854  ? LABSPEC 1.020 07/22/2019 1854  ? PHURINE 5.0 07/22/2019 1854  ? [place] NEGATIVE 07/22/2019 1854  ? [place] NEGATIVE 07/22/2019 1854  ? BILIRUBINUR SMALL (A) 07/22/2019 1854  ? [place] NEGATIVE 07/22/2019 1854  ? [place] NEGATIVE 07/22/2019 1854  ? NITRITE NEGATIVE 07/22/2019 1854  ? LEUKOCYTESUR NEGATIVE 07/22/2019 1854  ? ?Sepsis Labs: ?@[email or web address] (procalcitonin:4,lacticidven:4) ? ?) ?Recent Results (from the past 240 hour(s))  ?Resp Panel by RT-PCR (Flu A&B, Covid) Nasopharyngeal Swab     Status: None  ? Collection Time: 05/29/21  9:50 PM  ? Specimen: Nasopharyngeal Swab; Nasopharyngeal(NP) swabs in vial transport medium  ?Result Value Ref Range Status  ? SARS Coronavirus 2 by RT PCR NEGATIVE NEGATIVE Final  ?  Comment: (NOTE) ?SARS-CoV-2 target nucleic acids are NOT DETECTED. ? ?The SARS-CoV-2 RNA is generally detectable in upper respiratory ?specimens during the acute phase of infection.  The lowest ?concentration of SARS-CoV-2 viral copies this assay can detect is ?138 copies/mL. A negative result does not preclude SARS-Cov-2 ?infection and should not be used as the sole basis for treatment or ?other patient management decisions. A negative result may occur with  ?improper specimen collection/handling, submission of specimen other ?than nasopharyngeal swab, presence of viral mutation(s) within the ?areas targeted by this assay, and inadequate number of viral ?copies(<138 copies/mL). A negative result must be combined with ?clinical observations, patient history, and epidemiological ?information. The expected result is Negative. ? ?Fact Sheet for Patients:  ?EntrepreneurPulse.com.au ? ?Fact Sheet for Healthcare Providers:  ?IncredibleEmployment.be ? ?This test is no t yet approved or cleared by the [place] FDA and  ?has been authorized for detection and/or diagnosis of SARS-CoV-2 by ?FDA under an Emergency Use Authorization (EUA). This EUA will remain  ?in effect (meaning this test can be used) for t

## 2021-06-05 NOTE — Progress Notes (Signed)
I have assessed patient several times this morning.  His oxygen level has not been any higher that 90%.  I do not recommend trying the high flow salter at this time to decrease oxygen.  Patient is currently maintaining on the heated high flow at 20 l and 60%.  RN aware of decision.  ?

## 2021-06-06 DIAGNOSIS — I5033 Acute on chronic diastolic (congestive) heart failure: Secondary | ICD-10-CM | POA: Diagnosis not present

## 2021-06-06 LAB — BASIC METABOLIC PANEL
Anion gap: 8 (ref 5–15)
BUN: 29 mg/dL — ABNORMAL HIGH (ref 8–23)
CO2: 32 mmol/L (ref 22–32)
Calcium: 8.3 mg/dL — ABNORMAL LOW (ref 8.9–10.3)
Chloride: 94 mmol/L — ABNORMAL LOW (ref 98–111)
Creatinine, Ser: 0.96 mg/dL (ref 0.61–1.24)
GFR, Estimated: >60 mL/min (ref >60)
Glucose, Bld: 131 mg/dL — ABNORMAL HIGH (ref 70–99)
Potassium: 3.9 mmol/L (ref 3.5–5.1)
Sodium: 134 mmol/L — ABNORMAL LOW (ref 135–145)

## 2021-06-06 LAB — PROTIME-INR
INR: 2.9 — ABNORMAL HIGH (ref 0.8–1.2)
Prothrombin Time: 30.3 seconds — ABNORMAL HIGH (ref 11.4–15.2)

## 2021-06-06 LAB — CBC
HCT: 24.4 % — ABNORMAL LOW (ref 39.0–52.0)
Hemoglobin: 8 g/dL — ABNORMAL LOW (ref 13.0–17.0)
MCH: 34.3 pg — ABNORMAL HIGH (ref 26.0–34.0)
MCHC: 32.8 g/dL (ref 30.0–36.0)
MCV: 104.7 fL — ABNORMAL HIGH (ref 80.0–100.0)
Platelets: 190 10*3/uL (ref 150–400)
RBC: 2.33 MIL/uL — ABNORMAL LOW (ref 4.22–5.81)
RDW: 14.6 % (ref 11.5–15.5)
WBC: 5.4 10*3/uL (ref 4.0–10.5)
nRBC: 0 % (ref 0.0–0.2)

## 2021-06-06 MED ORDER — SODIUM CHLORIDE 0.9 % IV SOLN
510.0000 mg | Freq: Once | INTRAVENOUS | Status: AC
  Administered 2021-06-07: 510 mg via INTRAVENOUS
  Filled 2021-06-06: qty 17

## 2021-06-06 MED ORDER — SPIRONOLACTONE 25 MG PO TABS
25.0000 mg | ORAL_TABLET | Freq: Every day | ORAL | Status: DC
  Administered 2021-06-06 – 2021-06-11 (×6): 25 mg via ORAL
  Filled 2021-06-06 (×6): qty 1

## 2021-06-06 MED ORDER — METHYLPREDNISOLONE SODIUM SUCC 125 MG IJ SOLR: 60.0000 mg | INTRAMUSCULAR | Status: DC

## 2021-06-06 MED ORDER — WARFARIN SODIUM 5 MG PO TABS
5.0000 mg | ORAL_TABLET | Freq: Once | ORAL | Status: AC
  Administered 2021-06-06: 5 mg via ORAL
  Filled 2021-06-06: qty 1

## 2021-06-06 MED ORDER — DILTIAZEM HCL-DEXTROSE 125-5 MG/125ML-% IV SOLN (PREMIX)
2.5000 mg/h | INTRAVENOUS | Status: DC
  Administered 2021-06-06: 2.5 mg/h via INTRAVENOUS
  Filled 2021-06-06: qty 125

## 2021-06-06 MED ORDER — IPRATROPIUM-ALBUTEROL 0.5-2.5 (3) MG/3ML IN SOLN
3.0000 mL | Freq: Four times a day (QID) | RESPIRATORY_TRACT | Status: DC
  Administered 2021-06-06 (×3): 3 mL via RESPIRATORY_TRACT
  Filled 2021-06-06 (×4): qty 3

## 2021-06-06 NOTE — Progress Notes (Addendum)
Heart Failure Stewardship Pharmacist Progress Note ? ? ?PCP: Glendon Axe, MD ?PCP-Cardiologist: Elouise Munroe, MD  ? ? ?HPI:  ?76 yo M with PMH of CHF, cor pulmonale, CAD, HTN, afib, AAA, NSCLC s/p radiation, RA, CKD, tobacco use, and asthma/COPD. He presented to the ED on 4/5 with worsening LE edema, weight gain, dyspnea, and orthopnea. CXR with no evidence of acute cardiopulmonary disease. An ECHO was done on 4/6 and LVEF is 60-65% with moderate LVH and low normal RV function. Hospital course complicated by PNA and significant AKI. ? ?Current HF Medications: ?Diuretic: furosemide 40 mg IV BID ?Beta Blocker: metoprolol tartrate 50 mg BID ?Aldosterone Antagonist: spironolactone 25 mg daily ?Other: Ferrlecit 250 mg IV daily x 4  ? ?Prior to admission HF Medications: ?Diuretic: furosemide 20 mg PRN ? ?Pertinent Lab Values: ?Serum creatinine 0.96, BUN 29, Potassium 3.9, Sodium 134, BNP 570.9 ?TSAT 9, Ferritin 307 ? ?Vital Signs: ?Weight: 224 lbs (admission weight: 243 lbs) ?Blood pressure: 110/70s  ?Heart rate: 80s  ?I/O: -2.2L yesterday; net -24.6L ? ?Medication Assistance / Insurance Benefits Check: ?Does the patient have prescription insurance?  Yes ?Type of insurance plan: [employer] ? ?Outpatient Pharmacy:  ?[facility] ?Is the patient willing to use [place] pharmacy at discharge? Yes ?Is the patient willing to transition their outpatient pharmacy to utilize a [facility] outpatient pharmacy?   Pending ?  ? ?Assessment: ?1. Acute on chronic diastolic CHF (EF 00-34%) due to multifactorial NICM (COPD, OSA, tobacco use, cor pulmonale). NYHA class II symptoms. ?- Continue furosemide 40 mg IV BID. Plan to transition to PO tomorrow. ?- Continue metoprolol tartrate 50 mg BID ?- Agree with starting spironolactone 25 mg daily ?- Consider restarting Jardiance 10 mg daily prior to discharge if renal function allows ?- Continue IV iron but switch to Feraheme 510 mg x 1 for  volume concern  ?  ?Plan: ?1) Medication changes recommended at this time: ?- Change Ferrlicit to Feraheme  ? ?2) Patient assistance: ?- $374.23 remaining on deductible ? ?3)  Education  ?- To be completed prior to discharge ? ?Kerby Nora, PharmD, BCPS ?Heart Failure Stewardship Pharmacist ?Phone [phone] ? ? ?

## 2021-06-06 NOTE — Progress Notes (Signed)
ANTICOAGULATION CONSULT NOTE - Follow Up Consult ? ?Pharmacy Consult for Warfarin ?Indication: atrial fibrillation ? ?Allergies  ?Allergen Reactions  ? Tape Other (See Comments)  ?  Patient takes Coumadin!! Tape BRUISES and TEARS THE SKIN  ? Propoxyphene Nausea And Vomiting  ? ? ?Patient Measurements: ?Height: 6\' 1"  (185.4 cm) ?Weight: 101.9 kg (224 lb 10.4 oz) ?IBW/kg (Calculated) : 79.9 ? ?Vital Signs: ?Temp: 97.3 ?F (36.3 ?C) (04/17 0447) ?Temp Source: Oral (04/17 0447) ?BP: 109/71 (04/17 0447) ?Pulse Rate: 95 (04/17 0447) ? ?Labs: ?Recent Labs  ?  06/04/21 ?0100 06/05/21 ?0329 06/05/21 ?1200 06/06/21 ?0124  ?HGB 7.9* 7.7*  --  8.0*  ?HCT 25.1* 23.5*  --  24.4*  ?PLT 143* 157  --  190  ?LABPROT 26.4* 29.5*  --  30.3*  ?INR 2.5* 2.8*  --  2.9*  ?CREATININE 1.14 1.05 1.03 0.96  ? ? ? ?Estimated Creatinine Clearance: 80.8 mL/min (by C-G formula based on SCr of 0.96 mg/dL). ? ?Assessment: ?76 y/o M admitted 05/25/21 with heart failure exacerbation. Continues on warfarin as PTA for afib. Pharmacy consulted for inpatient warfarin dosing. INR remains therapeutic at 2.9 on PTA dosing but trending up. CBC stable. No bleeding noted. ? ?PTA dose  Warfarin 7.5 mg MWFSun and 5 mg TTSat. ?Goal of Therapy:  ?INR 2-3 ?Monitor platelets by anticoagulation protocol: Yes ?  ?Plan:  ?Warfarin 5mg  x1 today  ?Daily PT/INR and CBC  ? ? ?Bonnita Nasuti Pharm.D. CPP, BCPS ?Clinical Pharmacist ?[phone] ?06/06/2021 9:21 AM  ? ? ? ?

## 2021-06-06 NOTE — Plan of Care (Signed)

## 2021-06-06 NOTE — Progress Notes (Signed)
?   06/06/21 2230  ?Assess: MEWS Score  ?Temp 97.6 ?F (36.4 ?C)  ?BP (!) 114/58  ?Pulse Rate 93  ?ECG Heart Rate (!) 115  ?Resp 14  ?Level of Consciousness Alert  ?SpO2 94 %  ?O2 Device HFNC  ?Assess: MEWS Score  ?MEWS Temp 0  ?MEWS Systolic 0  ?MEWS Pulse 2  ?MEWS RR 0  ?MEWS LOC 0  ?MEWS Score 2  ?MEWS Score Color Yellow  ?Assess: if the MEWS score is Yellow or Red  ?Were vital signs taken at a resting state? Yes  ?Focused Assessment Change from prior assessment (see assessment flowsheet)  ?Early Detection of Sepsis Score *See Row Information* Low  ?MEWS guidelines implemented *See Row Information* Yes  ?Treat  ?MEWS Interventions Escalated (See documentation below)  ?Pain Scale 0-10  ?Pain Score 0  ?Notify: Charge Nurse/RN  ?Name of Charge Nurse/RN Notified Tanya, RN  ?Date Charge Nurse/RN Notified 06/06/21  ?Time Charge Nurse/RN Notified 2130  ?Notify: Provider  ?Provider Name/Title Dr. Alcario Drought  ?Date Provider Notified 06/06/21  ?Time Provider Notified 2130  ?Notification Type Page  ?Notification Reason Change in status  ?Provider response See new orders  ?Date of Provider Response 06/06/21  ?Time of Provider Response 2135  ?Document  ?Progress note created (see row info) Yes  ? ? ?

## 2021-06-06 NOTE — Progress Notes (Signed)
Physical Therapy Treatment ?Patient Details ?Name: Jake [name] ?MRN: [id number] ?DOB: [birth year] ?Today's Date: 06/06/2021 ? ? ?History of Present Illness Pt is a 76 y.o. M who presents 05/25/2021 with working diagnosis of decompensated heart failure. Significant PMH: heart failure, COPD, HTN, atrial fibrillation, asthma, peripheral polyneuropathy, RA. ? ?  ?PT Comments  ? ? Patient reports feeling well today with improved breathing. Progressing well towards PT goals. Session focused on progressive ambulation outside of the room now that pt is off HHFNC. Sp02 dropped to 87% briefly on 10L/min 02 [place], recovers quickly with rest. HR ranging from low 100s-154 bpm max with mobility. Reports no pain today. Encouraged increasing activity and walking bouts to improve strength/mobility and endurance. Discharge recommendation updated to home with HHPT due to improved progress pending pt continues to improve and has some family support at home initially. Will follow. ?   ?Recommendations for follow up therapy are one component of a multi-disciplinary discharge planning process, led by the attending physician.  Recommendations may be updated based on patient status, additional functional criteria and insurance authorization. ? ?Follow Up Recommendations ? Home health PT ?  ?  ?Assistance Recommended at Discharge Intermittent Supervision/Assistance  ?Patient can return home with the following A little help with walking and/or transfers;A little help with bathing/dressing/bathroom;Assist for transportation;Assistance with cooking/housework ?  ?Equipment Recommendations ? None recommended by PT  ?  ?Recommendations for Other Services   ? ? ?  ?Precautions / Restrictions Precautions ?Precautions: Fall;Other (comment) ?Precaution Comments: watch HR and BP ?Restrictions ?Weight Bearing Restrictions: No  ?  ? ?Mobility ? Bed Mobility ?  ?  ?  ?  ?  ?  ?  ?General bed mobility comments: Up in chair upon PT arrival. ?   ? ?Transfers ?Overall transfer level: Needs assistance ?Equipment used: Rolling walker (2 wheels) ?Transfers: Sit to/from Stand ?Sit to Stand: Min guard ?  ?  ?  ?  ?  ?General transfer comment: Min guard for safety. Stood from chair x1, no evidence of imbalance. ?  ? ?Ambulation/Gait ?Ambulation/Gait assistance: Min guard ?Gait Distance (Feet): 120 Feet ?Assistive device: Rolling walker (2 wheels) ?Gait Pattern/deviations: Step-through pattern, Decreased stride length, Trunk flexed ?Gait velocity: decreased ?  ?  ?General Gait Details: Slow, steady gait with use of RW, 2/4 DOE. Sp02 dropped to 87% briefly on 10L/min02 [place] but recovers quickly with rest. HR up to 154 bpm post walking, A-fib. ? ? ?Stairs ?  ?  ?  ?  ?  ? ? ?Wheelchair Mobility ?  ? ?Modified Rankin (Stroke Patients Only) ?  ? ? ?  ?Balance Overall balance assessment: Needs assistance ?Sitting-balance support: Feet supported, No upper extremity supported ?Sitting balance-Leahy Scale: Good ?  ?  ?Standing balance support: During functional activity ?Standing balance-Leahy Scale: Poor ?Standing balance comment: reliant on RW ?  ?  ?  ?  ?  ?  ?  ?  ?  ?  ?  ?  ? ?  ?Cognition Arousal/Alertness: Awake/alert ?Behavior During Therapy: [facility] for tasks assessed/performed ?Overall Cognitive Status: Within Functional Limits for tasks assessed ?  ?  ?  ?  ?  ?  ?  ?  ?  ?  ?  ?  ?  ?  ?  ?  ?  ?  ?  ? ?  ?Exercises   ? ?  ?General Comments General comments (skin integrity, edema, etc.): Sp02 dropped to 87% briefly on 10L/min 02 [place], recovers quickly with rest. HR  ranging from low 100s-154 bpm max with mobility. ?  ?  ? ?Pertinent Vitals/Pain Pain Assessment ?Pain Assessment: No/denies pain  ? ? ?Home Living   ?  ?  ?  ?  ?  ?  ?  ?  ?  ?   ?  ?Prior Function    ?  ?  ?   ? ?PT Goals (current goals can now be found in the care plan section) Progress towards PT goals: Progressing toward goals ? ?  ?Frequency ? ? ? Min 3X/week ? ? ? ?  ?PT Plan Discharge plan needs  to be updated  ? ? ?Co-evaluation   ?  ?  ?  ?  ? ?  ?AM-PAC PT "6 Clicks" Mobility   ?Outcome Measure ? Help needed turning from your back to your side while in a flat bed without using bedrails?: None ?Help needed moving from lying on your back to sitting on the side of a flat bed without using bedrails?: A Little ?Help needed moving to and from a bed to a chair (including a wheelchair)?: A Little ?Help needed standing up from a chair using your arms (e.g., wheelchair or bedside chair)?: A Little ?Help needed to walk in hospital room?: A Little ?Help needed climbing 3-5 steps with a railing? : A Little ?6 Click Score: 19 ? ?  ?End of Session Equipment Utilized During Treatment: Gait belt;Oxygen ?Activity Tolerance: Patient tolerated treatment well ?Patient left: in chair;with call bell/phone within reach ?Nurse Communication: Mobility status ?PT Visit Diagnosis: Unsteadiness on feet (R26.81);Difficulty in walking, not elsewhere classified (R26.2) ?  ? ? ?Time: 0272-5366 ?PT Time Calculation (min) (ACUTE ONLY): 16 min ? ?Charges:  $Gait Training: 8-22 mins          ?          ? ?Marisa Severin, PT, DPT ?Acute Rehabilitation Services ?Secure chat preferred ?Office [phone] ? ? ? ? ? ?[place] ?06/06/2021, 9:58 AM ? ?

## 2021-06-06 NOTE — Progress Notes (Signed)
?PROGRESS NOTE ? ? ? 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 CXR with no evidence of acute cardiopulmonary disease. An ECHO was done on 4/6 and LVEF is 60-65% with moderate LVH and low normal RV function.  ?-Hospital course complicated by right lower lobe aspiration pneumonia and sepsis ?-Had significant worsening oxygen requirement this admission up to 20 L high flow, 80% FiO2 ? ? ?Subjective: ?-Feels better overall, breathing continuing to improve, intermittent cough, less wheezing ? ?Assessment and Plan: ? ?Acute hypoxic respiratory failure ?-2 days ago was requiring 80% FiO2, 20 L high flow ?-secondary to acute on chronic diastolic CHF, aspiration pneumonia and COPD exacerbation in the background of advanced COPD, chronic respiratory failure ?-Continue IV Lasix 1 more day, transition to oral diuretics tomorrow ?-Still wheezing, started on IV steroids, continue duo nebs, pulmonary toilet, flutter valve ?-Completes 7-day course of Unasyn today ?-Weaned off high flow this morning, to 10 L nasal cannula, continue to aggressively wean down as tolerated ?-Increase activity, PT eval today ? ?Acute on chronic diastolic CHF ?Moderate PAH ?-Echo with LV systolic function preserved EF 60 to 65%, Right atrial severe dilatation.  ?-Volume status improving, diuresed with IV Lasix, he is 24.6 L negative, weight down 26 lbs ?-Continue IV Lasix 1 more day, add Aldactone, continue metoprolol ? ?Aspiration pneumonia ([place]) ?Sepsis, not poa ?-Developed aspiration pneumonia this admission ?Chest film/CT chest personally reviewed noted right lower lobe infiltrate.  ?-Wean O2 as tolerated, SLP evaluation completed ?-Completed 7 days of  IV Unasyn 4/16 ?-Flutter valve, incentive spirometry ? ?COPD with acute exacerbation ([place]) ?-Wheezing less, likely COPD exacerbation in the setting of aspiration pneumonia,  ?-See discussion above ?-will cut down IV steroids, transition to oral ? ?A-fib ([place]) ?-Continue rate control with metoprolol and diltiazem.  ?-Continue warfarin for anticoagulation ? ?Iron deficiency anemia ?-Add IV iron, trend hemoglobin, denies any overt blood loss ? ?Acute metabolic encephalopathy ?-In the setting of sepsis, aspiration pneumonia, resolved ? ?Tobacco abuse ?-Heavy smoker, smoked 3 ppd for 50+ years, now down to 0.5 PPD ?-Counseled ? ?Acute kidney injury superimposed on chronic kidney disease ([place]) ?CKD stage 2, Hyponatremia. ?-AKI has resolved, continue diuretics ? ?Essential hypertension ?Continue close blood pressure monitoring.  ?Continue diuresis with furosemide and rate control with metoprolol and diltiazem.  ? ?Rheumatoid arthritis ([place]) ?-Stable ? ?Peripheral polyneuropathy ?Stable, continue medical therapy with gabapenitn.  ? ? ?Class 1 obesity ?Calculated BMI is 31.5 ? ?DVT prophylaxis: Warfarin ?Code Status: Full code ?Family Communication: Discussed with patient in detail, no family at bedside ?Disposition Plan: Home pending improvement in oxygenation ? ?Procedures:  ? ?Antimicrobials:  ? ? ?Objective: ?Vitals:  ? 06/06/21 0125 06/06/21 0447 06/06/21 0820 06/06/21 0830  ?BP:  109/71    ?Pulse: (!) 104 95    ?Resp: 16 17    ?Temp:  (!) 97.3 ?F (36.3 ?C)    ?TempSrc:  Oral    ?SpO2: 96% 96% 94% 94%  ?Weight:  101.9 kg    ?Height:      ? ? ?Intake/Output Summary (Last 24 hours) at 06/06/2021 0957 ?Last data filed at 06/06/2021 0400 ?Gross per 24 hour  ?Intake 961.89 ml  ?Output 2200 ml  ?Net -2979.[place]  ml  ? ?Filed Weights  ? 06/04/21 0601 06/04/21 2323 06/06/21 0447  ?Weight: 100 kg 100.9 kg 101.9 kg  ? ? ?Examination: ? ?General exam: Pleasant chronically ill male sitting up in the recliner, AAOx3, no distress ?HEENT:  Positive JVD ?CVs: S1-S2, regular rhythm ?Lungs: Improving expiratory wheezes, improving air movement ?Abdomen: Soft, nontender, bowel sounds present ?Extremities: Trace edema  ?Skin: No rashes ?Psychiatry: Mood & affect appropriate.  ? ? ? ?Data Reviewed:  ? ?CBC: ?Recent Labs  ?Lab 06/02/21 ?3016 06/03/21 ?0109 06/03/21 ?1526 06/04/21 ?0100 06/05/21 ?0329 06/06/21 ?0124  ?WBC 4.5 5.2  --  5.0 5.2 5.4  ?NEUTROABS  --  4.2  --   --   --   --   ?HGB 8.8* 8.1* 8.5* 7.9* 7.7* 8.0*  ?HCT 27.2* 25.1* 25.0* 25.1* 23.5* 24.4*  ?MCV 105.8* 106.4*  --  106.8* 104.9* 104.7*  ?PLT 128* 142*  --  143* 157 190  ? ?Basic Metabolic Panel: ?Recent Labs  ?Lab 06/03/21 ?3235 06/03/21 ?1526 06/04/21 ?0100 06/05/21 ?0329 06/05/21 ?1200 06/06/21 ?0124  ?NA 132* 131* 134* 131* 131* 134*  ?K 4.3 4.1 3.9 4.0 4.0 3.9  ?CL 94*  --  95* 92* 91* 94*  ?CO2 32  --  31 33* 30 32  ?GLUCOSE 127*  --  133* 154* 175* 131*  ?BUN 23  --  27* 27* 27* 29*  ?CREATININE 1.10  --  1.14 1.05 1.03 0.96  ?CALCIUM 8.0*  --  7.8* 7.9* 8.3* 8.3*  ? ?GFR: ?Estimated Creatinine Clearance: 80.8 mL/min (by C-G formula based on SCr of 0.96 mg/dL). ?Liver Function Tests: ?No results for input(s): AST, ALT, ALKPHOS, BILITOT, PROT, ALBUMIN in the last 168 hours. ?No results for input(s): LIPASE, AMYLASE in the last 168 hours. ?No results for input(s): AMMONIA in the last 168 hours. ?Coagulation Profile: ?Recent Labs  ?Lab 06/02/21 ?5732 06/03/21 ?0052 06/04/21 ?0100 06/05/21 ?0329 06/06/21 ?0124  ?INR 2.2* 2.2* 2.5* 2.8* 2.9*  ? ?Cardiac Enzymes: ?No results for input(s): CKTOTAL, CKMB, CKMBINDEX, TROPONINI in the last 168 hours. ?BNP (last 3 results) ?No results for input(s): PROBNP in the last 8760 hours. ?HbA1C: ?No results for input(s): HGBA1C in the last 72 hours. ?CBG: ?No results for input(s): GLUCAP in the last 168 hours. ? ?Lipid Profile: ?No results for input(s): CHOL, HDL, LDLCALC, TRIG, CHOLHDL, LDLDIRECT in the last 72 hours. ?Thyroid Function Tests: ?No  results for input(s): TSH, T4TOTAL, FREET4, T3FREE, THYROIDAB in the last 72 hours. ?Anemia Panel: ?Recent Labs  ?  06/04/21 ?1116  ?VITAMINB12 773  ?FOLATE 38.3  ?FERRITIN 307  ?TIBC 260  ?IRON 24*  ?RETICCTPCT 3.7*  ? ?Urine analysis: ?   ?Component Value Date/Time  ? [place] YELLOW 07/22/2019 1854  ? APPEARANCEUR CLEAR 07/22/2019 1854  ? LABSPEC 1.020 07/22/2019 1854  ? PHURINE 5.0 07/22/2019 1854  ? [place] NEGATIVE 07/22/2019 1854  ? [employer] NEGATIVE 07/22/2019 1854  ? BILIRUBINUR SMALL (A) 07/22/2019 1854  ? [place] NEGATIVE 07/22/2019 1854  ? [place] NEGATIVE 07/22/2019 1854  ? NITRITE NEGATIVE 07/22/2019 1854  ? LEUKOCYTESUR NEGATIVE 07/22/2019 1854  ? ?Sepsis Labs: ?@[email or web address] (procalcitonin:4,lacticidven:4) ? ?) ?Recent Results (from the past 240 hour(s))  ?Resp Panel by RT-PCR (Flu A&B, Covid) Nasopharyngeal Swab     Status: None  ? Collection Time: 05/29/21  9:50 PM  ? Specimen: Nasopharyngeal Swab; Nasopharyngeal(NP) swabs in vial transport medium  ?Result Value Ref Range Status  ? SARS Coronavirus 2 by RT PCR NEGATIVE NEGATIVE Final  ?  Comment: (NOTE) ?SARS-CoV-2 target nucleic acids are NOT DETECTED. ? ?The SARS-CoV-2 RNA is generally detectable in upper respiratory ?specimens during the acute phase of infection. The lowest ?concentration of SARS-CoV-2 viral copies this assay can detect is ?138 copies/mL. A negative result does not preclude SARS-Cov-2 ?infection and should not be used as the sole basis for treatment or ?other patient management decisions. A negative result may occur with  ?improper specimen collection/handling, submission of specimen other ?than nasopharyngeal swab, presence of viral mutation(s) within the ?areas targeted by this assay, and inadequate number of viral ?copies(<138 copies/mL). A negative result must be combined with ?clinical observations, patient history, and epidemiological ?information. The expected result is Negative. ? ?Fact Sheet for Patients:   ?EntrepreneurPulse.com.au ? ?Fact Sheet for Healthcare Providers:  ?IncredibleEmployment.be ? ?This test is no t yet approved or cleared by the [place] and  ?has been [employer]

## 2021-06-06 NOTE — Plan of Care (Signed)
  Problem: Education: Goal: Ability to demonstrate management of disease process will improve Outcome: Progressing Goal: Ability to verbalize understanding of medication therapies will improve Outcome: Progressing Goal: Individualized Educational Video(s) Outcome: Progressing   Problem: Cardiac: Goal: Ability to achieve and maintain adequate cardiopulmonary perfusion will improve Outcome: Progressing   

## 2021-06-06 NOTE — Progress Notes (Signed)
Inpatient Rehab Admissions Coordinator:  ? ?Per therapy recommendations,  patient was screened for CIR candidacy by Clemens Catholic, MS, CCC-SLP  At this time, Pt. Ambulating at supervision level, does not appear to require the Intensity  of CIR. I will not pursue consult.  Please contact me with any questions.  ? ?Clemens Catholic, MS, CCC-SLP ?Rehab Admissions Coordinator  ?[phone] (celll) ?[phone] (office) ? ?

## 2021-06-06 NOTE — Progress Notes (Signed)
Occupational Therapy Treatment ?Patient Details ?Name: Jake [name] ?MRN: [id number] ?DOB: [birth year] ?Today's Date: 06/06/2021 ? ? ?History of present illness Pt is a 76 y.o. M who presents 05/25/2021 with working diagnosis of decompensated heart failure. Significant PMH: heart failure, COPD, HTN, atrial fibrillation, asthma, peripheral polyneuropathy, RA. ?  ?OT comments ? Patient continues to make steady progress towards goals in skilled OT session. Patient's session encompassed returning back to bed due to fatigue. Patient had walked with both PT and mobility earlier. Of note, when returning to supine patient requiring increased oxygen up to 10L to maintain saturations above 90% (RN notified at end of session). Heart rate ranging from 112-136 with stand pivot transfer and in supine. OT recommendation updated to [name] due to progress; OT will continue to follow acutely.   ? ?Recommendations for follow up therapy are one component of a multi-disciplinary discharge planning process, led by the attending physician.  Recommendations may be updated based on patient status, additional functional criteria and insurance authorization. ?   ?Follow Up Recommendations ? Home health OT  ?  ?Assistance Recommended at Discharge Intermittent Supervision/Assistance  ?Patient can return home with the following ? A little help with walking and/or transfers;A lot of help with bathing/dressing/bathroom;Assistance with cooking/housework;Assistance with feeding;Direct supervision/assist for medications management;Direct supervision/assist for financial management;Assist for transportation;Help with stairs or ramp for entrance ?  ?Equipment Recommendations ? None recommended by OT (Patient has DME needed)  ?  ?Recommendations for Other Services   ? ?  ?Precautions / Restrictions Precautions ?Precautions: Fall;Other (comment) ?Precaution Comments: watch HR and BP ?Restrictions ?Weight Bearing Restrictions: No  ? ? ?  ? ?Mobility Bed  Mobility ?Overal bed mobility: Needs Assistance ?Bed Mobility: Sit to Supine ?  ?  ?Supine to sit: Supervision ?  ?  ?General bed mobility comments: Up in chair upon OT arrival, returning back to bed at end of session ?  ? ?Transfers ?Overall transfer level: Needs assistance ?Equipment used: Rolling walker (2 wheels) ?Transfers: Sit to/from Stand ?Sit to Stand: Min guard ?  ?  ?  ?  ?  ?General transfer comment: Min guard for safety. Stood from chair to return back to bed (had walked with PT and then with mobility), no evidence of imbalance. ?  ?  ?Balance Overall balance assessment: Needs assistance ?Sitting-balance support: Feet supported, No upper extremity supported ?Sitting balance-Leahy Scale: Good ?  ?  ?Standing balance support: During functional activity ?Standing balance-Leahy Scale: Poor ?Standing balance comment: reliant on RW ?  ?  ?  ?  ?  ?  ?  ?  ?  ?  ?  ?   ? ?ADL either performed or assessed with clinical judgement  ? ?ADL Overall ADL's : Needs assistance/impaired ?  ?  ?  ?  ?  ?  ?  ?  ?  ?  ?  ?  ?Toilet Transfer: Minimal assistance;Ambulation;Rolling walker (2 wheels) ?Toilet Transfer Details (indicate cue type and reason): simluated with transfer to back to bed ?  ?  ?  ?  ?Functional mobility during ADLs: Cueing for sequencing;Cueing for safety;Rolling walker (2 wheels);Minimal assistance ?General ADL Comments: Patient continues to progress towards goals ?  ? ?Extremity/Trunk Assessment   ?  ?  ?  ?  ?  ? ?Vision   ?  ?  ?Perception   ?  ?Praxis   ?  ? ?Cognition Arousal/Alertness: Awake/alert ?Behavior During Therapy: [facility] for tasks assessed/performed ?Overall Cognitive Status: Within Functional Limits for tasks  assessed ?  ?  ?  ?  ?  ?  ?  ?  ?  ?  ?  ?  ?  ?  ?  ?  ?  ?  ?  ?   ?Exercises   ? ?  ?Shoulder Instructions   ? ? ?  ?General Comments    ? ? ?Pertinent Vitals/ Pain       Pain Assessment ?Pain Assessment: No/denies pain ? ?Home Living   ?  ?  ?  ?  ?  ?  ?  ?  ?  ?  ?  ?  ?  ?  ?   ?  ?  ?  ? ?  ?Prior Functioning/Environment    ?  ?  ?  ?   ? ?Frequency ? Min 3X/week  ? ? ? ? ?  ?Progress Toward Goals ? ?OT Goals(current goals can now be found in the care plan section) ? Progress towards OT goals: Progressing toward goals ? ?Acute Rehab OT Goals ?Patient Stated Goal: to get better and go home ?OT Goal Formulation: With patient ?Time For Goal Achievement: 06/13/21 ?Potential to Achieve Goals: Good  ?Plan Discharge plan needs to be updated   ? ?Co-evaluation ? ? ?   ?  ?  ?  ?  ? ?  ?AM-PAC OT "6 Clicks" Daily Activity     ?Outcome Measure ? ? Help from another person eating meals?: None ?Help from another person taking care of personal grooming?: None ?Help from another person toileting, which includes using toliet, bedpan, or urinal?: A Little ?Help from another person bathing (including washing, rinsing, drying)?: A Little ?Help from another person to put on and taking off regular upper body clothing?: A Little ?Help from another person to put on and taking off regular lower body clothing?: A Lot ?6 Click Score: 19 ? ?  ?End of Session Equipment Utilized During Treatment: Oxygen;Rolling walker (2 wheels) ? ?OT Visit Diagnosis: Unsteadiness on feet (R26.81);Other abnormalities of gait and mobility (R26.89);Muscle weakness (generalized) (M62.81) ?  ?Activity Tolerance Patient tolerated treatment well ?  ?Patient Left in bed;with call bell/phone within reach ?  ?Nurse Communication Mobility status;Other (comment) (Bumped up to 10L oxygen) ?  ? ?   ? ?Time: 3500-9381 ?OT Time Calculation (min): 10 min ? ?Charges: OT General Charges ?$OT Visit: 1 Visit ?OT Treatments ?$Self Care/Home Management : 8-22 mins ? ?Corinne Ports E. Darshana Curnutt, OTR/L ?Acute Rehabilitation Services ?[phone] ?[phone]  ? ?Corinne Ports Lillee Mooneyhan ?06/06/2021, 3:19 PM ?

## 2021-06-06 NOTE — Progress Notes (Signed)
?  Mobility Specialist Criteria Algorithm Info. ? ? 06/06/21 1155  ?Oxygen Therapy  ?SpO2 91 %  ?O2 Device Nasal Cannula  ?Patient Activity (if Appropriate) Ambulating  ?Pulse Oximetry Type Continuous  ?Mobility  ?Activity Ambulated with assistance in hallway  ?Range of Motion/Exercises Active;All extremities  ?Level of Assistance Standby assist, set-up cues, supervision of patient - no hands on  ?Assistive Device Front wheel walker  ?Distance Ambulated (ft) 140 ft  ?Activity Response Tolerated well  ? ?Patient received in chair eager to participate in mobility. Ambulated in hallway supervision level with slow steady gait. SpO2 maintained >90% throughout ambulation on 10L. Tolerated ambulation well without complaint or incident. Was left in chair with all needs met, call bell in reach. ? ?06/06/2021 ?12:29 PM ? ?[place] Dori Devino, CMS, BS EXP ?Acute Rehabilitation Services  ?YELYH:[phone] ?Office: [phone] ? ?

## 2021-06-07 DIAGNOSIS — I5033 Acute on chronic diastolic (congestive) heart failure: Secondary | ICD-10-CM | POA: Diagnosis not present

## 2021-06-07 LAB — BASIC METABOLIC PANEL
Anion gap: 11 (ref 5–15)
Anion gap: 10 (ref 5–15)
BUN: 33 mg/dL — ABNORMAL HIGH (ref 8–23)
BUN: 35 mg/dL — ABNORMAL HIGH (ref 8–23)
CO2: 31 mmol/L (ref 22–32)
CO2: 33 mmol/L — ABNORMAL HIGH (ref 22–32)
Calcium: 8.1 mg/dL — ABNORMAL LOW (ref 8.9–10.3)
Calcium: 8.4 mg/dL — ABNORMAL LOW (ref 8.9–10.3)
Chloride: 92 mmol/L — ABNORMAL LOW (ref 98–111)
Chloride: 92 mmol/L — ABNORMAL LOW (ref 98–111)
Creatinine, Ser: 1.14 mg/dL (ref 0.61–1.24)
Creatinine, Ser: 1.16 mg/dL (ref 0.61–1.24)
GFR, Estimated: >60 mL/min (ref >60)
GFR, Estimated: >60 mL/min (ref >60)
Glucose, Bld: 129 mg/dL — ABNORMAL HIGH (ref 70–99)
Glucose, Bld: 232 mg/dL — ABNORMAL HIGH (ref 70–99)
Potassium: 3.6 mmol/L (ref 3.5–5.1)
Potassium: 3.7 mmol/L (ref 3.5–5.1)
Sodium: 134 mmol/L — ABNORMAL LOW (ref 135–145)
Sodium: 135 mmol/L (ref 135–145)

## 2021-06-07 LAB — CBC
HCT: 25.9 % — ABNORMAL LOW (ref 39.0–52.0)
Hemoglobin: 8.1 g/dL — ABNORMAL LOW (ref 13.0–17.0)
MCH: 33.2 pg (ref 26.0–34.0)
MCHC: 31.3 g/dL (ref 30.0–36.0)
MCV: 106.1 fL — ABNORMAL HIGH (ref 80.0–100.0)
Platelets: 195 10*3/uL (ref 150–400)
RBC: 2.44 MIL/uL — ABNORMAL LOW (ref 4.22–5.81)
RDW: 14.6 % (ref 11.5–15.5)
WBC: 4.7 10*3/uL (ref 4.0–10.5)
nRBC: 0.4 % — ABNORMAL HIGH (ref 0.0–0.2)

## 2021-06-07 LAB — PROTIME-INR
INR: 3.7 — ABNORMAL HIGH (ref 0.8–1.2)
Prothrombin Time: 36.4 seconds — ABNORMAL HIGH (ref 11.4–15.2)

## 2021-06-07 MED ORDER — FUROSEMIDE 40 MG PO TABS
40.0000 mg | ORAL_TABLET | Freq: Two times a day (BID) | ORAL | Status: DC
Start: 2021-06-07 — End: 2021-06-09
  Administered 2021-06-07 – 2021-06-09 (×5): 40 mg via ORAL
  Filled 2021-06-07 (×5): qty 1

## 2021-06-07 MED ORDER — DILTIAZEM HCL 60 MG PO TABS
60.0000 mg | ORAL_TABLET | Freq: Four times a day (QID) | ORAL | Status: DC
  Administered 2021-06-07 – 2021-06-08 (×3): 60 mg via ORAL
  Filled 2021-06-07 (×3): qty 1

## 2021-06-07 MED ORDER — LEVALBUTEROL HCL 0.63 MG/3ML IN NEBU
0.6300 mg | INHALATION_SOLUTION | Freq: Four times a day (QID) | RESPIRATORY_TRACT | Status: DC | PRN
Start: 2021-06-07 — End: 2021-06-11
  Administered 2021-06-07: 0.63 mg via RESPIRATORY_TRACT
  Filled 2021-06-07: qty 3

## 2021-06-07 MED ORDER — POTASSIUM CHLORIDE CRYS ER 20 MEQ PO TBCR
40.0000 meq | EXTENDED_RELEASE_TABLET | Freq: Every day | ORAL | Status: DC
  Administered 2021-06-07 – 2021-06-09 (×3): 40 meq via ORAL
  Filled 2021-06-07 (×3): qty 2

## 2021-06-07 MED ORDER — LEVALBUTEROL HCL 0.63 MG/3ML IN NEBU
0.6300 mg | INHALATION_SOLUTION | Freq: Four times a day (QID) | RESPIRATORY_TRACT | Status: DC
  Administered 2021-06-07: 0.63 mg via RESPIRATORY_TRACT
  Filled 2021-06-07: qty 3

## 2021-06-07 MED ORDER — DILTIAZEM HCL 60 MG PO TABS
30.0000 mg | ORAL_TABLET | Freq: Four times a day (QID) | ORAL | Status: DC
  Administered 2021-06-07: 30 mg via ORAL
  Filled 2021-06-07: qty 1

## 2021-06-07 MED ORDER — PREDNISONE 50 MG PO TABS
50.0000 mg | ORAL_TABLET | Freq: Every day | ORAL | Status: DC
  Administered 2021-06-07 – 2021-06-08 (×2): 50 mg via ORAL
  Filled 2021-06-07 (×2): qty 1

## 2021-06-07 NOTE — Plan of Care (Signed)

## 2021-06-07 NOTE — Progress Notes (Signed)
Heart Failure Stewardship Pharmacist Progress Note ? ? ?PCP: Glendon Axe, MD ?PCP-Cardiologist: Elouise Munroe, MD  ? ? ?HPI:  ?76 yo M with PMH of CHF, cor pulmonale, CAD, HTN, afib, AAA, NSCLC s/p radiation, RA, CKD, tobacco use, and asthma/COPD. He presented to the ED on 4/5 with worsening LE edema, weight gain, dyspnea, and orthopnea. CXR with no evidence of acute cardiopulmonary disease. An ECHO was done on 4/6 and LVEF is 60-65% with moderate LVH and low normal RV function. Hospital course complicated by PNA and significant AKI with have both been resolved. ? ?Current HF Medications: ?Diuretic: furosemide 40 mg PO BID ?Beta Blocker: metoprolol tartrate 50 mg BID ?Aldosterone Antagonist: spironolactone 25 mg daily ?Other: Ferrlecit 250 mg IV daily x 2;  Feraheme 510 mg x 1 ? ?Prior to admission HF Medications: ?Diuretic: furosemide 20 mg PRN ? ?Pertinent Lab Values: ?Serum creatinine 1.16, BUN 35, Potassium 3.7, Sodium 135, BNP 570.9 ?TSAT 9, Ferritin 307 ? ?Vital Signs: ?Weight: 224 lbs (admission weight: 243 lbs) ?Blood pressure: 110/70s  ?Heart rate: 90-100s  ?I/O: -2.3L yesterday; net -27L ? ?Medication Assistance / Insurance Benefits Check: ?Does the patient have prescription insurance?  Yes ?Type of insurance plan: [employer] ? ?Outpatient Pharmacy:  ?[facility] ?Is the patient willing to use [place] pharmacy at discharge? Yes ?Is the patient willing to transition their outpatient pharmacy to utilize a [facility] outpatient pharmacy?   Pending ?  ? ?Assessment: ?1. Acute on chronic diastolic CHF (EF 33-54%) due to multifactorial NICM (COPD, OSA, tobacco use, cor pulmonale). NYHA class II symptoms. ?- Agree with starting furosemide 40 mg PO BID ?- Continue metoprolol tartrate 50 mg BID ?- Continue spironolactone 25 mg daily ?- Consider restarting Jardiance 10 mg daily prior to discharge ?- S/p Ferrlicit 562 mg IV x 2 and Feraheme 510 mg IV x 1 ?   ?Plan: ?1) Medication changes recommended at this time: ?- Restart Jardiance 10 mg daily ? ?2) Patient assistance: ?- $374.23 remaining on deductible ? ?3)  Education  ?- To be completed prior to discharge ? ?Kerby Nora, PharmD, BCPS ?Heart Failure Stewardship Pharmacist ?Phone [phone] ? ? ?

## 2021-06-07 NOTE — Progress Notes (Signed)
Heart Failure Nurse Navigator Progress Note  ? ?Spoke with patient again in his room about coming to HF TOC. Explained the benefits and reason behind the visit. At this time patient still declines , he states he wants to follow up with his cardiologist when he is discharged, hopefully by the end of the week.  ? ? ?Earnestine Leys, BSN, RN ?Heart Failure Nurse Navigator ?Secure Chat Only  ?

## 2021-06-07 NOTE — Progress Notes (Signed)
?  Mobility Specialist Criteria Algorithm Info. ? ? 06/07/21 1625  ?Mobility  ?Activity Ambulated with assistance in hallway;Transferred from bed to chair ?(to chair after ambulation)  ?Range of Motion/Exercises Active;All extremities  ?Level of Assistance Standby assist, set-up cues, supervision of patient - no hands on  ?Assistive Device Front wheel walker  ?Distance Ambulated (ft) 200 ft  ?Activity Response Tolerated well  ? ?Patient received in bed eager to participate. Ambulated in hallway supervision level with slow gait. Oxygen saturation maintained >88% on 10L. Asx throughout. Returned to room without complaint or incident. Was left with all needs met, call bell in reach. ? ?06/07/2021 ?4:43 PM ? ?[place] Saira Kramme, CMS, BS EXP ?Acute Rehabilitation Services  ?ZCHYI:[phone] ?Office: [phone] ? ?

## 2021-06-07 NOTE — Progress Notes (Signed)
?PROGRESS NOTE ? ? ? 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 CXR with no evidence of acute cardiopulmonary disease. An ECHO was done on 4/6 and LVEF is 60-65% with moderate LVH and low normal RV function.  ?-Hospital course complicated by right lower lobe aspiration pneumonia and sepsis ?-Had significant worsening oxygen requirement this admission up to 20 L high flow, 80% FiO2 ?-Slowly improving ? ?Subjective: ?-Feels better overall, breathing improving, no symptoms ? ?Assessment and Plan: ? ?Acute hypoxic respiratory failure ?-3days ago was requiring 80% FiO2, 20 L high flow ?-secondary to acute on chronic diastolic CHF, aspiration pneumonia and COPD exacerbation in the background of advanced COPD, chronic respiratory failure ?-Clinically improving he is 26.9 L negative, switch to p.o. antibiotics ?-Less wheezing now, switch to oral prednisone, continue duo nebs, pulmonary toilet, flutter valve ?-Completed 7-day course of IV Unasyn 4/17 ?-Weaned off high flow yesterday, now on 9 L [place], continue to wean ? ?Acute on chronic diastolic CHF ?Moderate PAH ?-Echo with LV systolic function preserved EF 60 to 65%, Right atrial severe dilatation.  ?-Volume status improving, diuresed with IV Lasix, he is 24.6 L negative, weight down 26 lbs ?-Change to oral diuretics, continue Aldactone, metoprolol ?-We will consider Farxiga at DC if BP/kidney function remains stable ? ?Aspiration pneumonia ([place]) ?Sepsis, not poa ?-Developed aspiration pneumonia this admission ?Chest film/CT chest personally reviewed noted right lower lobe infiltrate.  ?-Wean O2 as tolerated, SLP evaluation completed ?-Completed 7 days of IV Unasyn  4/17 ?-Flutter valve, incentive spirometry ? ?COPD with acute exacerbation ([place]) ?-Wheezing less, likely COPD exacerbation in the setting of aspiration pneumonia,  ?-See discussion above ?-Switch to prednisone taper ? ?A-fib ([place]) with RVR ?-Heart rate elevated overnight, required Cardizem gtt., continue metoprolol, attempt to transition back to oral diltiazem ?-Continue warfarin for anticoagulation ? ?Iron deficiency anemia ?-Given IV iron, hemoglobin is stable, denies any overt blood loss ? ?Acute metabolic encephalopathy ?-In the setting of sepsis, aspiration pneumonia, resolved ? ?Tobacco abuse ?-Heavy smoker, smoked 3 ppd for 50+ years, now down to 0.5 PPD ?-Counseled ? ?Acute kidney injury superimposed on chronic kidney disease ([employer]) ?CKD stage 2, Hyponatremia. ?-AKI has resolved, continue diuretics ? ?Essential hypertension ?Continue close blood pressure monitoring.  ?Continue diuresis with furosemide and rate control with metoprolol and diltiazem.  ? ?Rheumatoid arthritis ([place]) ?-Stable ? ?Peripheral polyneuropathy ?Stable, continue medical therapy with gabapenitn.  ? ? ?Class 1 obesity ?Calculated BMI is 31.5 ? ?DVT prophylaxis: Warfarin ?Code Status: Full code ?Family Communication: Discussed with patient in detail, no family at bedside ?Disposition Plan: Home pending improvement in oxygenation, likely 48 hours ? ?Procedures:  ? ?Antimicrobials:  ? ? ?Objective: ?Vitals:  ? 06/07/21 0000 06/07/21 0455 06/07/21 0700 06/07/21 0759  ?BP: 107/67 105/63 (!) 116/96   ?Pulse: 70 82 96   ?Resp: 15 12 17    ?Temp:  98 ?F (36.7 ?C) (!) 97.2 ?F (36.2 ?C)   ?TempSrc:  Oral Oral   ?SpO2: 98% 95% 95% 94%  ?Weight:  110.9 kg    ?Height:      ? ? ?Intake/Output Summary (Last 24 hours) at 06/07/2021 1110 ?Last data filed at 06/07/2021 0400 ?Gross per 24 hour  ?Intake  255.32 ml  ?Output 3050 ml  ?Net -2794.68 ml  ? ?Filed Weights  ? 06/04/21 2323 06/06/21 0447 06/07/21 0455  ?Weight: 100.9 kg 101.9 kg 110.9 kg   ? ? ?Examination: ? ?General exam: Pleasant chronically ill male sitting up in bed, AAOx3, no distress ?HEENT: No JVD ?CVS: S1-S2, irregularly irregular rhythm ?Lungs: Improving air movement, no expiratory wheezes today  ?Abdomen: Soft, nontender, bowel sounds present ?Extremities: Trace edema  ?Skin: No rashes ?Psychiatry: Mood & affect appropriate.  ? ? ? ?Data Reviewed:  ? ?CBC: ?Recent Labs  ?Lab 06/03/21 ?7035 06/03/21 ?1526 06/04/21 ?0100 06/05/21 ?0329 06/06/21 ?0124 06/07/21 ?0132  ?WBC 5.2  --  5.0 5.2 5.4 4.7  ?NEUTROABS 4.2  --   --   --   --   --   ?HGB 8.1* 8.5* 7.9* 7.7* 8.0* 8.1*  ?HCT 25.1* 25.0* 25.1* 23.5* 24.4* 25.9*  ?MCV 106.4*  --  106.8* 104.9* 104.7* 106.1*  ?PLT 142*  --  143* 157 190 195  ? ?Basic Metabolic Panel: ?Recent Labs  ?Lab 06/05/21 ?0329 06/05/21 ?1200 06/06/21 ?0124 06/07/21 ?0132 06/07/21 ?0093  ?NA 131* 131* 134* 134* 135  ?K 4.0 4.0 3.9 3.6 3.7  ?CL 92* 91* 94* 92* 92*  ?CO2 33* 30 32 31 33*  ?GLUCOSE 154* 175* 131* 129* 232*  ?BUN 27* 27* 29* 33* 35*  ?CREATININE 1.05 1.03 0.96 1.14 1.16  ?CALCIUM 7.9* 8.3* 8.3* 8.1* 8.4*  ? ?GFR: ?Estimated Creatinine Clearance: 69.6 mL/min (by C-G formula based on SCr of 1.16 mg/dL). ?Liver Function Tests: ?No results for input(s): AST, ALT, ALKPHOS, BILITOT, PROT, ALBUMIN in the last 168 hours. ?No results for input(s): LIPASE, AMYLASE in the last 168 hours. ?No results for input(s): AMMONIA in the last 168 hours. ?Coagulation Profile: ?Recent Labs  ?Lab 06/03/21 ?8182 06/04/21 ?0100 06/05/21 ?0329 06/06/21 ?0124 06/07/21 ?0132  ?INR 2.2* 2.5* 2.8* 2.9* 3.7*  ? ?Cardiac Enzymes: ?No results for input(s): CKTOTAL, CKMB, CKMBINDEX, TROPONINI in the last 168 hours. ?BNP (last 3 results) ?No results for input(s): PROBNP in the last 8760 hours. ?HbA1C: ?No results for input(s): HGBA1C in the last 72 hours. ?CBG: ?No results for input(s): GLUCAP in the last 168 hours. ? ?Lipid Profile: ?No results for input(s): CHOL, HDL, LDLCALC, TRIG,  CHOLHDL, LDLDIRECT in the last 72 hours. ?Thyroid Function Tests: ?No results for input(s): TSH, T4TOTAL, FREET4, T3FREE, THYROIDAB in the last 72 hours. ?Anemia Panel: ?Recent Labs  ?  06/04/21 ?1116  ?VITAMINB12 773  ?FOLATE 38.3  ?FERRITIN 307  ?TIBC 260  ?IRON 24*  ?RETICCTPCT 3.7*  ? ?Urine analysis: ?   ?Component Value Date/Time  ? [place] YELLOW 07/22/2019 1854  ? APPEARANCEUR CLEAR 07/22/2019 1854  ? LABSPEC 1.020 07/22/2019 1854  ? PHURINE 5.0 07/22/2019 1854  ? [place] NEGATIVE 07/22/2019 1854  ? [place] NEGATIVE 07/22/2019 1854  ? BILIRUBINUR SMALL (A) 07/22/2019 1854  ? [place] NEGATIVE 07/22/2019 1854  ? [place] NEGATIVE 07/22/2019 1854  ? NITRITE NEGATIVE 07/22/2019 1854  ? LEUKOCYTESUR NEGATIVE 07/22/2019 1854  ? ?Sepsis Labs: ?@[email or web address] (procalcitonin:4,lacticidven:4) ? ?) ?Recent Results (from the past 240 hour(s))  ?Resp Panel by RT-PCR (Flu A&B, Covid) Nasopharyngeal Swab     Status: None  ? Collection Time: 05/29/21  9:50 PM  ? Specimen: Nasopharyngeal Swab; Nasopharyngeal(NP) swabs in vial transport medium  ?Result Value Ref Range Status  ? SARS Coronavirus 2 by RT PCR NEGATIVE NEGATIVE Final  ?  Comment: (NOTE) ?SARS-CoV-2 target nucleic acids are NOT DETECTED. ? ?The  SARS-CoV-2 RNA is generally detectable in upper respiratory ?specimens during the acute phase of infection. The lowest ?concentration of SARS-CoV-2 viral copies this assay can detect is ?138 copies/mL. A negative result does not preclude SARS-Cov-2 ?infection and should not be used as the sole basis for treatment or ?other patient management decisions. A negative result may occur with  ?improper specimen collection/handling, submission of specimen other ?than nasopharyngeal swab, presence of viral mutation(s) within the ?areas targeted by this assay, and inadequate number of viral ?copies(<138 copies/mL). A negative result must be combined with ?clinical observations, patient history, and epidemiological ?information. The  expected result is Negative. ? ?Fact Sheet for Patients:  ?EntrepreneurPulse.com.au ? ?Fact Sheet for Healthcare Providers:  ?IncredibleEmployment.be ? ?This test is no t yet approved or cleared by the

## 2021-06-07 NOTE — Progress Notes (Signed)
ANTICOAGULATION CONSULT NOTE - Follow Up Consult ? ?Pharmacy Consult for Warfarin ?Indication: atrial fibrillation ? ?Allergies  ?Allergen Reactions  ? Tape Other (See Comments)  ?  Patient takes Coumadin!! Tape BRUISES and TEARS THE SKIN  ? Propoxyphene Nausea And Vomiting  ? ? ?Patient Measurements: ?Height: 6\' 1"  (185.4 cm) ?Weight: 110.9 kg (244 lb 7.8 oz) ?IBW/kg (Calculated) : 79.9 ? ?Vital Signs: ?Temp: 97.2 ?F (36.2 ?C) (04/18 0700) ?Temp Source: Oral (04/18 0700) ?BP: 116/96 (04/18 0700) ?Pulse Rate: 96 (04/18 0700) ? ?Labs: ?Recent Labs  ?  06/05/21 ?0329 06/05/21 ?1200 06/06/21 ?0124 06/07/21 ?0132 06/07/21 ?2440  ?HGB 7.7*  --  8.0* 8.1*  --   ?HCT 23.5*  --  24.4* 25.9*  --   ?PLT 157  --  190 195  --   ?LABPROT 29.5*  --  30.3* 36.4*  --   ?INR 2.8*  --  2.9* 3.7*  --   ?CREATININE 1.05   < > 0.96 1.14 1.16  ? < > = values in this interval not displayed.  ? ? ? ?Estimated Creatinine Clearance: 69.6 mL/min (by C-G formula based on SCr of 1.16 mg/dL). ? ?Assessment: ?76 y/o M admitted 05/25/21 with heart failure exacerbation. Continues on warfarin as PTA for afib. Pharmacy consulted for inpatient warfarin dosing. INR trended up on PTA warfarin dose now 3.7. ?CBC stable. No bleeding noted. ? ?PTA dose  Warfarin 7.5 mg MWFSun and 5 mg TTSat. ?Goal of Therapy:  ?INR 2-3 ?Monitor platelets by anticoagulation protocol: Yes ?  ?Plan:  ?Warfarin - hold today  ?Daily PT/INR and CBC  ? ? ?Bonnita Nasuti Pharm.D. CPP, BCPS ?Clinical Pharmacist ?[phone] ?06/07/2021 11:13 AM  ? ? ? ?

## 2021-06-07 NOTE — Progress Notes (Signed)
Patient heart rate sustaining 110-150's, Patient asymptomatic. EKG obtained confirming A-Fib with RVR. On call MD paged Dr. Alcario Drought, see new orders. Cardizem gtt started at 2.76ml/hr.  ?

## 2021-06-08 DIAGNOSIS — J441 Chronic obstructive pulmonary disease with (acute) exacerbation: Secondary | ICD-10-CM | POA: Diagnosis not present

## 2021-06-08 DIAGNOSIS — N179 Acute kidney failure, unspecified: Secondary | ICD-10-CM | POA: Diagnosis not present

## 2021-06-08 DIAGNOSIS — J690 Pneumonitis due to inhalation of food and vomit: Secondary | ICD-10-CM | POA: Diagnosis not present

## 2021-06-08 DIAGNOSIS — I5033 Acute on chronic diastolic (congestive) heart failure: Secondary | ICD-10-CM | POA: Diagnosis not present

## 2021-06-08 LAB — BASIC METABOLIC PANEL
Anion gap: 7 (ref 5–15)
BUN: 39 mg/dL — ABNORMAL HIGH (ref 8–23)
CO2: 33 mmol/L — ABNORMAL HIGH (ref 22–32)
Calcium: 8.2 mg/dL — ABNORMAL LOW (ref 8.9–10.3)
Chloride: 94 mmol/L — ABNORMAL LOW (ref 98–111)
Creatinine, Ser: 1.11 mg/dL (ref 0.61–1.24)
GFR, Estimated: >60 mL/min (ref >60)
Glucose, Bld: 154 mg/dL — ABNORMAL HIGH (ref 70–99)
Potassium: 4.2 mmol/L (ref 3.5–5.1)
Sodium: 134 mmol/L — ABNORMAL LOW (ref 135–145)

## 2021-06-08 LAB — CBC
HCT: 26.3 % — ABNORMAL LOW (ref 39.0–52.0)
Hemoglobin: 8.5 g/dL — ABNORMAL LOW (ref 13.0–17.0)
MCH: 34.3 pg — ABNORMAL HIGH (ref 26.0–34.0)
MCHC: 32.3 g/dL (ref 30.0–36.0)
MCV: 106 fL — ABNORMAL HIGH (ref 80.0–100.0)
Platelets: 208 10*3/uL (ref 150–400)
RBC: 2.48 MIL/uL — ABNORMAL LOW (ref 4.22–5.81)
RDW: 14.9 % (ref 11.5–15.5)
WBC: 6.4 10*3/uL (ref 4.0–10.5)
nRBC: 0.5 % — ABNORMAL HIGH (ref 0.0–0.2)

## 2021-06-08 LAB — PROTIME-INR
INR: 3.5 — ABNORMAL HIGH (ref 0.8–1.2)
Prothrombin Time: 35.1 seconds — ABNORMAL HIGH (ref 11.4–15.2)

## 2021-06-08 MED ORDER — PREDNISONE 20 MG PO TABS
20.0000 mg | ORAL_TABLET | Freq: Every day | ORAL | Status: DC
  Administered 2021-06-09 – 2021-06-10 (×2): 20 mg via ORAL
  Filled 2021-06-08 (×2): qty 1

## 2021-06-08 MED ORDER — EMPAGLIFLOZIN 10 MG PO TABS
10.0000 mg | ORAL_TABLET | Freq: Every day | ORAL | Status: DC
Start: 2021-06-08 — End: 2021-06-11
  Administered 2021-06-08 – 2021-06-11 (×4): 10 mg via ORAL
  Filled 2021-06-08 (×4): qty 1

## 2021-06-08 MED ORDER — DILTIAZEM HCL ER COATED BEADS 240 MG PO CP24
240.0000 mg | ORAL_CAPSULE | Freq: Every day | ORAL | Status: DC
  Administered 2021-06-08 – 2021-06-11 (×4): 240 mg via ORAL
  Filled 2021-06-08 (×4): qty 1

## 2021-06-08 NOTE — Plan of Care (Signed)
?  Problem: Education: ?Goal: Ability to verbalize understanding of medication therapies will improve ?Outcome: Progressing ?  ?Problem: Activity: ?Goal: Capacity to carry out activities will improve ?Outcome: Progressing ?  ?Problem: Education: ?Goal: Knowledge of General Education information will improve ?Description: Including pain rating scale, medication(s)/side effects and non-pharmacologic comfort measures ?Outcome: Progressing ?  ?Problem: Nutrition: ?Goal: Adequate nutrition will be maintained ?Outcome: Progressing ?  ?Problem: Coping: ?Goal: Level of anxiety will decrease ?Outcome: Progressing ?  ?Problem: Pain Managment: ?Goal: General experience of comfort will improve ?Outcome: Progressing ?  ?Problem: Safety: ?Goal: Ability to remain free from injury will improve ?Outcome: Progressing ?  ?

## 2021-06-08 NOTE — Progress Notes (Signed)
Occupational Therapy Treatment ?Patient Details ?Name: Jake [name] ?MRN: [id number] ?DOB: [birth year] ?Today's Date: 06/08/2021 ? ? ?History of present illness Pt is a 76 y.o. M who presents 05/25/2021 with working diagnosis of decompensated heart failure. Significant PMH: heart failure, COPD, HTN, atrial fibrillation, asthma, peripheral polyneuropathy, RA. ?  ?OT comments ? Patient continues to make steady progress towards goals in skilled OT session. Patient's session encompassed  functional mobility and lower body dressing. Patient continuing to require 10L to ambulate, with HR getting to 122 with initial transition to EOB, but not sustaining. Patient able to transition to 6L oxygen sitting in recliner (RN notified at end of session). OT will continue to follow.   ? ?Recommendations for follow up therapy are one component of a multi-disciplinary discharge planning process, led by the attending physician.  Recommendations may be updated based on patient status, additional functional criteria and insurance authorization. ?   ?Follow Up Recommendations ? Home health OT  ?  ?Assistance Recommended at Discharge Intermittent Supervision/Assistance  ?Patient can return home with the following ? A little help with walking and/or transfers;A lot of help with bathing/dressing/bathroom;Assistance with cooking/housework;Assistance with feeding;Direct supervision/assist for medications management;Direct supervision/assist for financial management;Assist for transportation;Help with stairs or ramp for entrance ?  ?Equipment Recommendations ? None recommended by OT (Patient has DME at home)  ?  ?Recommendations for Other Services   ? ?  ?Precautions / Restrictions Precautions ?Precautions: Fall;Other (comment) ?Precaution Comments: watch HR and BP ?Restrictions ?Weight Bearing Restrictions: No  ? ? ?  ? ?Mobility Bed Mobility ?Overal bed mobility: Needs Assistance ?Bed Mobility: Supine to Sit ?  ?  ?Supine to sit: Supervision ?  ?   ?  ?  ? ?Transfers ?Overall transfer level: Needs assistance ?Equipment used: Rolling walker (2 wheels) ?Transfers: Sit to/from Stand ?  ?  ?  ?Step pivot transfers: Min assist ?  ?  ?General transfer comment: Min guard for safety. Completed sit<>stand from bed to chair after completing ambulation out of the room, ?  ?  ?Balance Overall balance assessment: Needs assistance ?Sitting-balance support: Feet supported, No upper extremity supported ?Sitting balance-Leahy Scale: Good ?  ?  ?Standing balance support: During functional activity ?Standing balance-Leahy Scale: Poor ?Standing balance comment: reliant on RW ?  ?  ?  ?  ?  ?  ?  ?  ?  ?  ?  ?   ? ?ADL either performed or assessed with clinical judgement  ? ?ADL Overall ADL's : Needs assistance/impaired ?  ?  ?  ?  ?  ?  ?  ?  ?  ?  ?Lower Body Dressing: Minimal assistance;Sitting/lateral leans;Sit to/from stand ?  ?Toilet Transfer: Ambulation;Rolling walker (2 wheels);Min guard ?Toilet Transfer Details (indicate cue type and reason): simluated witht ransfer to chair after ambulation ?  ?  ?  ?  ?Functional mobility during ADLs: Cueing for sequencing;Cueing for safety;Rolling walker (2 wheels);Minimal assistance ?General ADL Comments: Patient continues to progress towards goals ?  ? ?Extremity/Trunk Assessment   ?  ?  ?  ?  ?  ? ?Vision   ?  ?  ?Perception   ?  ?Praxis   ?  ? ?Cognition Arousal/Alertness: Awake/alert ?Behavior During Therapy: [facility] for tasks assessed/performed ?Overall Cognitive Status: Within Functional Limits for tasks assessed ?  ?  ?  ?  ?  ?  ?  ?  ?  ?  ?  ?  ?  ?  ?  ?  ?  ?  ?  ?   ?  Exercises   ? ?  ?Shoulder Instructions   ? ? ?  ?General Comments    ? ? ?Pertinent Vitals/ Pain       Pain Assessment ?Pain Assessment: No/denies pain ? ?Home Living   ?  ?  ?  ?  ?  ?  ?  ?  ?  ?  ?  ?  ?  ?  ?  ?  ?  ?  ? ?  ?Prior Functioning/Environment    ?  ?  ?  ?   ? ?Frequency ? Min 3X/week  ? ? ? ? ?  ?Progress Toward Goals ? ?OT Goals(current goals  can now be found in the care plan section) ? Progress towards OT goals: Progressing toward goals ? ?Acute Rehab OT Goals ?Patient Stated Goal: to go smoke a cigarette ?OT Goal Formulation: With patient ?Time For Goal Achievement: 06/13/21 ?Potential to Achieve Goals: Good  ?Plan Discharge plan remains appropriate   ? ?Co-evaluation ? ? ?   ?  ?  ?  ?  ? ?  ?AM-PAC OT "6 Clicks" Daily Activity     ?Outcome Measure ? ? Help from another person eating meals?: None ?Help from another person taking care of personal grooming?: None ?Help from another person toileting, which includes using toliet, bedpan, or urinal?: A Little ?Help from another person bathing (including washing, rinsing, drying)?: A Little ?Help from another person to put on and taking off regular upper body clothing?: A Little ?Help from another person to put on and taking off regular lower body clothing?: A Little ?6 Click Score: 20 ? ?  ?End of Session Equipment Utilized During Treatment: Oxygen;Rolling walker (2 wheels);Gait belt ? ?OT Visit Diagnosis: Unsteadiness on feet (R26.81);Other abnormalities of gait and mobility (R26.89);Muscle weakness (generalized) (M62.81) ?  ?Activity Tolerance Patient tolerated treatment well ?  ?Patient Left in chair;with call bell/phone within reach ?  ?Nurse Communication Mobility status;Other (comment) (Put at 6L oxygen in chair) ?  ? ?   ? ?Time: 2876-8115 ?OT Time Calculation (min): 21 min ? ?Charges: OT General Charges ?$OT Visit: 1 Visit ?OT Treatments ?$Self Care/Home Management : 8-22 mins ? ?Corinne Ports E. Mackenzy Grumbine, OTR/L ?Acute Rehabilitation Services ?[phone] ?[phone]  ? ?Corinne Ports Ameliarose Shark ?06/08/2021, 11:36 AM ?

## 2021-06-08 NOTE — Progress Notes (Signed)
Physical Therapy Treatment ?Patient Details ?Name: Jake [name] ?MRN: [id number] ?DOB: 1945/12/02 ?Today's Date: 06/08/2021 ? ? ?History of Present Illness Pt is a 76 y.o. M who presents 05/25/2021 with working diagnosis of decompensated heart failure. Significant PMH: heart failure, COPD, HTN, atrial fibrillation, asthma, peripheral polyneuropathy, RA. ? ?  ?PT Comments  ? ? Pt is continuing to display improved activity tolerance/aerobic endurance through being able to ambulate up to ~300 ft in one bout with a RW at a SUP level. He was able to wean from 8L to 6L while ambulating, but unfortunately his SpO2 waveforms were inconsistent and thus unreliable in providing an accurate SpO2 reading. At rest his SpO2 remained in the 90s% on 6L O2. Pt's HR jumped up to 150 bpm with x3 consecutive sit <> stand reps, thus ceased further reps. Will continue to follow acutely. Current recommendations remain appropriate. ?  ?Recommendations for follow up therapy are one component of a multi-disciplinary discharge planning process, led by the attending physician.  Recommendations may be updated based on patient status, additional functional criteria and insurance authorization. ? ?Follow Up Recommendations ? Home health PT ?  ?  ?Assistance Recommended at Discharge Intermittent Supervision/Assistance  ?Patient can return home with the following A little help with walking and/or transfers;A little help with bathing/dressing/bathroom;Assist for transportation;Assistance with cooking/housework ?  ?Equipment Recommendations ? None recommended by PT  ?  ?Recommendations for Other Services   ? ? ?  ?Precautions / Restrictions Precautions ?Precautions: Fall;Other (comment) ?Precaution Comments: watch HR, BP, and SpO2 ?Restrictions ?Weight Bearing Restrictions: No  ?  ? ?Mobility ? Bed Mobility ?Overal bed mobility: Needs Assistance ?Bed Mobility: Supine to Sit, Sit to Supine ?  ?  ?Supine to sit: Supervision, HOB elevated ?Sit to supine:  Supervision, HOB elevated ?  ?General bed mobility comments: Supervision for safety and line management. ?  ? ?Transfers ?Overall transfer level: Needs assistance ?Equipment used: Rolling walker (2 wheels) ?Transfers: Sit to/from Stand ?Sit to Stand: Min guard ?  ?  ?  ?  ?  ?General transfer comment: Extra time and cues to scoot to EOB for transfers. Pt rocking to gain momentum and pushing up off bed with R UE. Min guard assist for safety, x4 reps. ?  ? ?Ambulation/Gait ?Ambulation/Gait assistance: Supervision ?Gait Distance (Feet): 300 Feet ?Assistive device: Rolling walker (2 wheels) ?Gait Pattern/deviations: Step-through pattern, Decreased stride length, Trunk flexed, Narrow base of support ?Gait velocity: decreased ?Gait velocity interpretation: 1.31 - 2.62 ft/sec, indicative of limited community ambulator ?  ?General Gait Details: Slow, steady gait with use of RW, no LOB, supervision for safety. Cues provided to widen BOS, min to no carryover. Pt tolerates well with O2 decreased from 8L initially to 6L. Waveforms poor but primarily reading 80-90s%. ? ? ?Stairs ?  ?  ?  ?  ?  ? ? ?Wheelchair Mobility ?  ? ?Modified Rankin (Stroke Patients Only) ?  ? ? ?  ?Balance Overall balance assessment: Needs assistance ?Sitting-balance support: Feet supported, No upper extremity supported ?Sitting balance-Leahy Scale: Good ?  ?  ?Standing balance support: Bilateral upper extremity supported, During functional activity ?Standing balance-Leahy Scale: Poor ?Standing balance comment: reliant on RW ?  ?  ?  ?  ?  ?  ?  ?  ?  ?  ?  ?  ? ?  ?Cognition Arousal/Alertness: Awake/alert ?Behavior During Therapy: [facility] for tasks assessed/performed ?Overall Cognitive Status: Within Functional Limits for tasks assessed ?  ?  ?  ?  ?  ?  ?  ?  ?  ?  ?  ?  ?  ?  ?  ?  ?  ?  ?  ? ?  ?  Exercises Other Exercises ?Other Exercises: sit <> stand 3x in a row from EOB using 1 UE to push up to stand, deferred more reps secondary to increased HR up to  150 ? ?  ?General Comments General comments (skin integrity, edema, etc.): Weaned O2 from 8L while ambulating to 6L but SpO2 waveforms poor, reading as low as 79% at one moment then quickly jumping to 88-91%; HR up to 150 with repeated sit to stand reps ?  ?  ? ?Pertinent Vitals/Pain Pain Assessment ?Pain Assessment: No/denies pain  ? ? ?Home Living   ?  ?  ?  ?  ?  ?  ?  ?  ?  ?   ?  ?Prior Function    ?  ?  ?   ? ?PT Goals (current goals can now be found in the care plan section) Acute Rehab PT Goals ?Patient Stated Goal: to wean his O2 ?PT Goal Formulation: With patient ?Time For Goal Achievement: 06/11/21 ?Potential to Achieve Goals: Good ?Progress towards PT goals: Progressing toward goals ? ?  ?Frequency ? ? ? Min 3X/week ? ? ? ?  ?PT Plan Current plan remains appropriate  ? ? ?Co-evaluation   ?  ?  ?  ?  ? ?  ?AM-PAC PT "6 Clicks" Mobility   ?Outcome Measure ? Help needed turning from your back to your side while in a flat bed without using bedrails?: None ?Help needed moving from lying on your back to sitting on the side of a flat bed without using bedrails?: A Little ?Help needed moving to and from a bed to a chair (including a wheelchair)?: A Little ?Help needed standing up from a chair using your arms (e.g., wheelchair or bedside chair)?: A Little ?Help needed to walk in hospital room?: A Little ?Help needed climbing 3-5 steps with a railing? : A Little ?6 Click Score: 19 ? ?  ?End of Session Equipment Utilized During Treatment: Oxygen ?Activity Tolerance: Patient tolerated treatment well ?Patient left: in bed;with call bell/phone within reach;with bed alarm set ?  ?PT Visit Diagnosis: Unsteadiness on feet (R26.81);Difficulty in walking, not elsewhere classified (R26.2);Other abnormalities of gait and mobility (R26.89) ?  ? ? ?Time: 0998-3382 ?PT Time Calculation (min) (ACUTE ONLY): 20 min ? ?Charges:  $Gait Training: 8-22 mins          ?          ? ?Moishe Spice, PT, DPT ?Acute Rehabilitation  Services  ?Pager: [phone] ?Office: [phone] ? ? ? ?Maretta Bees Pettis ?06/08/2021, 3:40 PM ? ?

## 2021-06-08 NOTE — Progress Notes (Signed)
?Progress Note ? ? ?Patient: Jake [name] WCB:[id number] DOB: [birth year] DOA: 05/25/2021     14 ?DOS: the patient was seen and examined on 06/08/2021 ?  ?Brief hospital course: ?Mr. Tabor was admitted to the hospital with the working diagnosis of decompensated diastolic heart failure.  ?Hospitalization complicated with right lower lobe aspiration pneumonia and sepsis.  ? ?76 yo male with the past medical history of heart failure, COPD, hypertension, atrial fibrillation and asthma who presented with dyspnea and lower extremity edema. Reported worsening lower extremity edema for the last 6 weeks, he has gain 20 over last 2 weeks. Symptoms associated with dyspnea and 2 pillow orthopnea. On the day of admission home health nurse advice to come to the ED due worsening symptoms. On his initial physical examination his blood pressure is 101/57, HR 88, RR 16 and 02 saturation 92%, lungs with decreased breath sounds bilaterally, positive rales and wheezing, increased work of breathing, heart with S1 and S2 present and rhythmic, abdomen not distended and positive lower extremity edema ++.  ? ?Na 137, K 4,1 Cl 98, bicarbonate 32, glucose 162, bun 27, cr 1,2 ?BNP 570 ?Troponin 29-29  ?Wbc 6.3 hgb 10,0 hct 32, plt 91  ?INR 2,0  ? ?Chest radiograph with no cardiomegaly, bilateral interstitial infiltrates, small right pleural effusion.  ? ?EKG with 87 bpm, left axis deviation, normal intervals, atrial fibrillation with PVC, q wave V1 and V2 with no significant ST segment or T wave changes.  ? ?Patient was placed on IV furosemide for diuresis with good toleration. ?He had rapid ventricular response due to atrial fibrillation.   ?AV blockade was adjusted with diltiazem and metoprolol.  ? ?04/09 patient had worsening respiratory failure, with increase oxygen requirements. ?Chest film with new right lower lobe infiltrate, suspected aspiration pneumonia.  ?Started on antibiotic therapy.  ? ?04/11 improving oxygenation, with decreased 02  requirements from 30 L/min with 100% fi02 per heated high flow nasal.  ? ?04/14 chest film with right lower lobe infiltrate and left base atelectasis.  ?Positive congestion, with bilateral hilar vascular congestion.  ?Continue with high oxygen requirements.  ? ?Patient continue diuresing with improvement in oxygenation, completed antibiotic therapy ?Wean off from heated high flow nasal cannula to a regular high flow nasal cannula with good toleration.  ? ? ? ?Assessment and Plan: ?* Acute on chronic diastolic CHF (congestive heart failure) ([place]) ?Echocardiogram with LV systolic function preserved EF 60 to 61%, RV systolic function preserved. Right atrial severe dilatation. (poor acoustic windows)  ? ?04/07 chest radiograph with bilateral interstitial infiltrates with hilar vascular congestion, positive hyperinflation. (personally reviewed).  ? ?04/09 chest film with improved pulmonary edema but new right lower lobe infiltrate.  ? ?04/14 Chest radiograph with right lower lobe infiltrate and pulmonary edema.  ? ?Urine output is 1,100 ml over last 24 hrs, since admission is 27,938 ml negative.  ?Systolic blood pressure has been 101 to 103 mmHg.  ? ?Plan to continue diuresis with oral furosemide 40 mg po bid.  ?Continue with metoprolol and spironolactone.  ? ? ? ?Aspiration pneumonia ([place]) ?Acute on chronic hypoxemic respiratory failure. ?Severe sepsis not present on admission   ? ?Patient with aspiration pneumonia, not present on admission.  ?Chest film/CT chest personally reviewed noted right lower lobe infiltrate.  ? ?Patient required short term non invasive mechanical ventilation and transitioned to heated high flow nasal cannula.  ? ?Patient has completed antibiotic therapy with Unasyn.  ? ?On bronchodilator therapy ?Out of bed to chair tid with  meals.  ?Airway clearing techniques with flutter valve and incentive spiometer.  ? ?Oxygenation has improved, now using regular nasal cannula 7 L/min with O2 saturation  94%.  ?Continue to wean 02.  ? ?Acute metabolic encephalopathy due to sepsis, he did received haloperidol one time.  ?Encephalopathy has resolved.  ? ?COPD with acute exacerbation ([place]) ?Acute exacerbation.  ?Continue with systemic and inhaled steroids. ?Decreased prednisone to 20 mg daily.  ?Continue bronchodilatory therapy.  ?Out of bed to chair and continue Pt and OT ? ? ?A-fib ([place]) ?Home medications include metoprolol XL 50 mg and diltiazem 180 mg. ? ?Persistent atrial fibrillation on telemetry monitoring. ?Patient's heart rate 100 bpm, will transition to diltiazem 240 mg daily and continue with metoprolol 50 mg po bid.  ? ?Continue anticoagulation with warfarin per pharmacy protocol.  ?INR is  3.5   ? ?Acute kidney injury superimposed on chronic kidney disease ([place]) ?CKD stage 2, Hyponatremia. ? ?Stable renal function with serum cr at 1,11 with K at 4.2 and Na 134, bicarbonate is 33.  ?Continue diuresis with furosemide and follow up renal function in am.  ? ?Essential hypertension ?Continue diuresis and rate control atrial fibrillation.  ?Continue blood pressure monitoring.  ? ?Rheumatoid arthritis ([place]) ?No active flare.  ?Follow up as outpatient.  ? ?Peripheral polyneuropathy ?Stable, continue medical therapy with gabapenitn.  ? ?Anemia ?Thrombocytopenia ?Likely anemia of chronic disease combined with  ?Iron deficiency anemia now sp IV iron.  ? ? ?Class 1 obesity ?Calculated BMI is 31.5 ? ? ? ? ? ?  ? ?Subjective: Patient is feeling better, dyspnea and edema continue to improve, not back to his baseline  ? ?Physical Exam: ?Vitals:  ? 06/08/21 0351 06/08/21 0545 06/08/21 0620 06/08/21 0803  ?BP: (!) 103/59  110/74 102/60  ?Pulse: 83   93  ?Resp: 16   16  ?Temp: 97.6 ?F (36.4 ?C)   (!) 97.4 ?F (36.3 ?C)  ?TempSrc: Oral   Oral  ?SpO2: 94%   93%  ?Weight:  101.6 kg    ?Height:      ? ?Neurology awake and alert ?ENT with no pallor ?Cardiovascular with S1 and S2 present irregularly irregular with no gallops, or  rubs ?Respiratory with positive expiratory wheezing with rhonchi, with scattered rales ?Abdomen not distended ?Positive lower extremity edema ++ pitting bilaterally  ?Data Reviewed: ? ? ?Family Communication: no family a the bedside  ? ?Disposition: ?Status is: Inpatient ?Remains inpatient appropriate because: heart failure  ? Planned Discharge Destination:  to be determined  ? ?Author: ?Tawni Millers, MD ?06/08/2021 9:13 AM ? ?For on call review [email or web address].  ?

## 2021-06-08 NOTE — Progress Notes (Signed)
Heart Failure Stewardship Pharmacist Progress Note ? ? ?PCP: Glendon Axe, MD ?PCP-Cardiologist: Elouise Munroe, MD  ? ? ?HPI:  ?76 yo M with PMH of CHF, cor pulmonale, CAD, HTN, afib, AAA, NSCLC s/p radiation, RA, CKD, tobacco use, and asthma/COPD. He presented to the ED on 4/5 with worsening LE edema, weight gain, dyspnea, and orthopnea. CXR with no evidence of acute cardiopulmonary disease. An ECHO was done on 4/6 and LVEF is 60-65% with moderate LVH and low normal RV function. Hospital course complicated by PNA and significant AKI with have both been resolved. ? ?Current HF Medications: ?Diuretic: furosemide 40 mg PO BID ?Beta Blocker: metoprolol tartrate 50 mg BID ?Aldosterone Antagonist: spironolactone 25 mg daily ?Other: Ferrlecit 250 mg IV daily x 2;  Feraheme 510 mg x 1 ? ?Prior to admission HF Medications: ?Diuretic: furosemide 20 mg PRN ? ?Pertinent Lab Values: ?Serum creatinine 1.11, BUN 39, Potassium 4.2, Sodium 134, BNP 570.9 ?TSAT 9, Ferritin 307 ? ?Vital Signs: ?Weight: 223 lbs (admission weight: 243 lbs) ?Blood pressure: 100/60s  ?Heart rate: 80-100s  ?I/O: -746mL yesterday; net -28L ? ?Medication Assistance / Insurance Benefits Check: ?Does the patient have prescription insurance?  Yes ?Type of insurance plan: [employer] Medicare ? ?Outpatient Pharmacy:  ?[facility] ?Is the patient willing to use [facility] pharmacy at discharge? Yes ?Is the patient willing to transition their outpatient pharmacy to utilize a [facility] outpatient pharmacy?   Pending ?  ? ?Assessment: ?1. Acute on chronic diastolic CHF (EF 74-08%) due to multifactorial NICM (COPD, OSA, tobacco use, cor pulmonale). NYHA class II symptoms. ?- Continue furosemide 40 mg PO BID ?- Continue metoprolol tartrate 50 mg BID - consider increasing to 75 mg BID with tachycardia ?- Continue spironolactone 25 mg daily ?- Consider restarting Jardiance 10 mg daily prior to discharge ?- S/p Ferrlicit 144 mg IV  x 2 and Feraheme 510 mg IV x 1 ?  ?Plan: ?1) Medication changes recommended at this time: ?- Restart Jardiance 10 mg daily ?- Increase metoprolol to 75 mg BID ? ?2) Patient assistance: ?- $374.23 remaining on deductible ? ?3)  Education  ?- To be completed prior to discharge ? ?Kerby Nora, PharmD, BCPS ?Heart Failure Stewardship Pharmacist ?Phone [phone] ? ? ?

## 2021-06-08 NOTE — Progress Notes (Signed)
ANTICOAGULATION CONSULT NOTE - Follow Up Consult ? ?Pharmacy Consult for Warfarin ?Indication: atrial fibrillation ? ?Allergies  ?Allergen Reactions  ? Tape Other (See Comments)  ?  Patient takes Coumadin!! Tape BRUISES and TEARS THE SKIN  ? Propoxyphene Nausea And Vomiting  ? ? ?Patient Measurements: ?Height: 6\' 1"  (185.4 cm) ?Weight: 101.6 kg (223 lb 15.8 oz) ?IBW/kg (Calculated) : 79.9 ? ?Vital Signs: ?Temp: 97.4 ?F (36.3 ?C) (04/19 1497) ?Temp Source: Oral (04/19 0263) ?BP: 102/60 (04/19 0803) ?Pulse Rate: 93 (04/19 0803) ? ?Labs: ?Recent Labs  ?  06/06/21 ?0124 06/07/21 ?0132 06/07/21 ?0842 06/08/21 ?0130  ?HGB 8.0* 8.1*  --  8.5*  ?HCT 24.4* 25.9*  --  26.3*  ?PLT 190 195  --  208  ?LABPROT 30.3* 36.4*  --  35.1*  ?INR 2.9* 3.7*  --  3.5*  ?CREATININE 0.96 1.14 1.16 1.11  ? ? ? ?Estimated Creatinine Clearance: 69.8 mL/min (by C-G formula based on SCr of 1.11 mg/dL). ? ?Assessment: ?76 y/o M admitted 05/25/21 with heart failure exacerbation. Continues on warfarin as PTA for afib. Pharmacy consulted for inpatient warfarin dosing. INR trended up on PTA warfarin dose now 3.5. CBC stable. No bleeding noted. ? ?PTA dose  Warfarin 7.5 mg MWFSun and 5 mg TTSat. ? ?Goal of Therapy:  ?INR 2-3 ?Monitor platelets by anticoagulation protocol: Yes ?  ?Plan:  ?Hold warfarin today  ?Daily PT/INR and CBC  ? ? ?[name], PharmD, BCPS ?Clinical Pharmacist ?06/08/2021 9:06 AM ? ? ? ?

## 2021-06-09 DIAGNOSIS — I482 Chronic atrial fibrillation, unspecified: Secondary | ICD-10-CM | POA: Diagnosis not present

## 2021-06-09 DIAGNOSIS — J441 Chronic obstructive pulmonary disease with (acute) exacerbation: Secondary | ICD-10-CM | POA: Diagnosis not present

## 2021-06-09 DIAGNOSIS — I5033 Acute on chronic diastolic (congestive) heart failure: Secondary | ICD-10-CM | POA: Diagnosis not present

## 2021-06-09 DIAGNOSIS — J690 Pneumonitis due to inhalation of food and vomit: Secondary | ICD-10-CM | POA: Diagnosis not present

## 2021-06-09 LAB — BASIC METABOLIC PANEL
Anion gap: 10 (ref 5–15)
BUN: 42 mg/dL — ABNORMAL HIGH (ref 8–23)
CO2: 31 mmol/L (ref 22–32)
Calcium: 8.7 mg/dL — ABNORMAL LOW (ref 8.9–10.3)
Chloride: 94 mmol/L — ABNORMAL LOW (ref 98–111)
Creatinine, Ser: 1.25 mg/dL — ABNORMAL HIGH (ref 0.61–1.24)
GFR, Estimated: 59 mL/min — ABNORMAL LOW (ref >60)
Glucose, Bld: 115 mg/dL — ABNORMAL HIGH (ref 70–99)
Potassium: 4.4 mmol/L (ref 3.5–5.1)
Sodium: 135 mmol/L (ref 135–145)

## 2021-06-09 LAB — MAGNESIUM: Magnesium: 2.2 mg/dL (ref 1.7–2.4)

## 2021-06-09 LAB — PROTIME-INR
INR: 2.7 — ABNORMAL HIGH (ref 0.8–1.2)
Prothrombin Time: 28.3 seconds — ABNORMAL HIGH (ref 11.4–15.2)

## 2021-06-09 MED ORDER — WARFARIN SODIUM 5 MG PO TABS
5.0000 mg | ORAL_TABLET | Freq: Once | ORAL | Status: AC
Start: 2021-06-09 — End: 2021-06-09
  Administered 2021-06-09: 5 mg via ORAL
  Filled 2021-06-09: qty 1

## 2021-06-09 MED ORDER — FUROSEMIDE 40 MG PO TABS
40.0000 mg | ORAL_TABLET | Freq: Every day | ORAL | Status: DC
  Filled 2021-06-09: qty 1

## 2021-06-09 NOTE — Plan of Care (Signed)

## 2021-06-09 NOTE — Progress Notes (Signed)
Mobility Specialist Progress Note ? ? 06/09/21 1450  ?Mobility  ?Activity Refused mobility  ? ?Pt tired/sleepy and requesting for MS to comeback later. Will f/u later on in the day if time permits. ? ?Jake [name] ?Mobility Specialist ?Phone Number [phone] ? ?

## 2021-06-09 NOTE — TOC Progression Note (Signed)
Transition of Care (TOC) - Progression Note  ? ? ?Patient Details  ?Name: Jake [name] ?MRN: [id number] ?Date of Birth: Dec 27, 1945 ? ?Transition of Care (TOC) CM/SW Contact  ?Angelita Ingles, RN ?Phone [phone] ? ?06/09/2021, 3:22 PM ? ?Clinical Narrative:   Patient to discharge with home health recommendations. Patient is currently active with [facility] and will resume services at discharge. TOC continues to follow for disposition needs.  ? ? ?  ?  ? ?Expected Discharge Plan and Services ?  ?  ?  ?  ?  ?                ?  ?  ?  ?  ?  ?  ?  ?  ?  ?  ? ? ?Social Determinants of Health (SDOH) Interventions ?Food Insecurity Interventions: Intervention Not Indicated ?Financial Strain Interventions: Intervention Not Indicated ?Housing Interventions: Intervention Not Indicated ?Transportation Interventions: Intervention Not Indicated ? ?Readmission Risk Interventions ?   ? View : No data to display.  ?  ?  ?  ? ? ?

## 2021-06-09 NOTE — Progress Notes (Signed)
ANTICOAGULATION CONSULT NOTE - Follow Up Consult ? ?Pharmacy Consult for Warfarin ?Indication: atrial fibrillation ? ?Allergies  ?Allergen Reactions  ? Tape Other (See Comments)  ?  Patient takes Coumadin!! Tape BRUISES and TEARS THE SKIN  ? Propoxyphene Nausea And Vomiting  ? ? ?Patient Measurements: ?Height: 6\' 1"  (185.4 cm) ?Weight: 100.5 kg (221 lb 9 oz) ?IBW/kg (Calculated) : 79.9 ? ?Vital Signs: ?Temp: 97.4 ?F (36.3 ?C) (04/20 0845) ?Temp Source: Oral (04/20 0845) ?BP: 106/76 (04/20 0845) ?Pulse Rate: 91 (04/20 0845) ? ?Labs: ?Recent Labs  ?  06/07/21 ?0132 06/07/21 ?0842 06/08/21 ?0130 06/09/21 ?0109  ?HGB 8.1*  --  8.5*  --   ?HCT 25.9*  --  26.3*  --   ?PLT 195  --  208  --   ?LABPROT 36.4*  --  35.1* 28.3*  ?INR 3.7*  --  3.5* 2.7*  ?CREATININE 1.14 1.16 1.11 1.25*  ? ? ? ?Estimated Creatinine Clearance: 61.7 mL/min (A) (by C-G formula based on SCr of 1.25 mg/dL (H)). ? ?Assessment: ?76 y/o M admitted 05/25/21 with heart failure exacerbation. Continues on warfarin as PTA for afib. Pharmacy consulted for inpatient warfarin dosing. INR trended up on PTA warfarin dose and now is therapeutic at 2.7. CBC stable yesterday. No bleeding noted. ? ?PTA dose  Warfarin 7.5 mg MWFSun and 5 mg TTSat. ? ?Goal of Therapy:  ?INR 2-3 ?Monitor platelets by anticoagulation protocol: Yes ?  ?Plan:  ?Warfarin 5mg  x1 today  ?Daily PT/INR and CBC  ? ? ?[name], PharmD, BCPS ?Clinical Pharmacist ?06/09/2021 9:53 AM ? ? ? ?

## 2021-06-09 NOTE — Progress Notes (Signed)
Heart Failure Stewardship Pharmacist Progress Note ? ? ?PCP: Glendon Axe, MD ?PCP-Cardiologist: Elouise Munroe, MD  ? ? ?HPI:  ?76 yo M with PMH of CHF, cor pulmonale, CAD, HTN, afib, AAA, NSCLC s/p radiation, RA, CKD, tobacco use, and asthma/COPD. He presented to the ED on 4/5 with worsening LE edema, weight gain, dyspnea, and orthopnea. CXR with no evidence of acute cardiopulmonary disease. An ECHO was done on 4/6 and LVEF is 60-65% with moderate LVH and low normal RV function. Hospital course complicated by PNA and significant AKI with have both been resolved. ? ?Current HF Medications: ?Diuretic: furosemide 40 mg PO daily ?Beta Blocker: metoprolol tartrate 50 mg BID ?Aldosterone Antagonist: spironolactone 25 mg daily ?SGLT2i: Jardiance 10 mg daily ?Other: Ferrlecit 250 mg IV daily x 2;  Feraheme 510 mg x 1 ? ?Prior to admission HF Medications: ?Diuretic: furosemide 20 mg PRN ? ?Pertinent Lab Values: ?Serum creatinine 1.25, BUN 42, Potassium 4.4, Sodium 135, BNP 570.9 ?TSAT 9, Ferritin 307 ? ?Vital Signs: ?Weight: 221 lbs (admission weight: 243 lbs) ?Blood pressure: 100/60s  ?Heart rate: 80-100s  ?I/O: -3.7L yesterday; net -31.7L ? ?Medication Assistance / Insurance Benefits Check: ?Does the patient have prescription insurance?  Yes ?Type of insurance plan: [employer] Medicare ? ?Outpatient Pharmacy:  ?[facility] ?Is the patient willing to use [facility] pharmacy at discharge? Yes ?Is the patient willing to transition their outpatient pharmacy to utilize a [facility] outpatient pharmacy?   Pending ?  ? ?Assessment: ?1. Acute on chronic diastolic CHF (EF 54-65%) due to multifactorial NICM (COPD, OSA, tobacco use, cor pulmonale). NYHA class II symptoms. ?- Continue furosemide 40 mg PO daily ?- Continue metoprolol tartrate 50 mg BID - consider increasing to 75 mg BID with tachycardia. SBP occasionally <100 - watch ?- Continue spironolactone 25 mg daily ?- Continue Jardiance 10  mg daily ?- S/p Ferrlicit 681 mg IV x 2 and Feraheme 510 mg IV x 1 ?  ?Plan: ?1) Medication changes recommended at this time: ?- Increase metoprolol to 75 mg BID if BP allows ? ?2) Patient assistance: ?- $374.23 remaining on deductible ? ?3)  Education  ?- To be completed prior to discharge ? ?Kerby Nora, PharmD, BCPS ?Heart Failure Stewardship Pharmacist ?Phone [phone] ? ? ?

## 2021-06-09 NOTE — Progress Notes (Signed)
?Progress Note ? ? ?Patient: Jake [name] HYW:[id number] DOB: 1945-03-26 DOA: 05/25/2021     15 ?DOS: the patient was seen and examined on 06/09/2021 ?  ?Brief hospital course: ?Jake [name] was admitted to the hospital with the working diagnosis of decompensated diastolic heart failure.  ?Hospitalization complicated with right lower lobe aspiration pneumonia and sepsis.  ? ?76 yo male with the past medical history of heart failure, COPD, hypertension, atrial fibrillation and asthma who presented with dyspnea and lower extremity edema. Reported worsening lower extremity edema for the last 6 weeks, he has gain 20 over last 2 weeks. Symptoms associated with dyspnea and 2 pillow orthopnea. On the day of admission home health nurse advice to come to the ED due worsening symptoms. On his initial physical examination his blood pressure is 101/57, HR 88, RR 16 and 02 saturation 92%, lungs with decreased breath sounds bilaterally, positive rales and wheezing, increased work of breathing, heart with S1 and S2 present and rhythmic, abdomen not distended and positive lower extremity edema ++.  ? ?Na 137, K 4,1 Cl 98, bicarbonate 32, glucose 162, bun 27, cr 1,2 ?BNP 570 ?Troponin 29-29  ?Wbc 6.3 hgb 10,0 hct 32, plt 91  ?INR 2,0  ? ?Chest radiograph with no cardiomegaly, bilateral interstitial infiltrates, small right pleural effusion.  ? ?EKG with 87 bpm, left axis deviation, normal intervals, atrial fibrillation with PVC, q wave V1 and V2 with no significant ST segment or T wave changes.  ? ?Patient was placed on IV furosemide for diuresis with good toleration. ?He had rapid ventricular response due to atrial fibrillation.   ?AV blockade was adjusted with diltiazem and metoprolol.  ? ?04/09 patient had worsening respiratory failure, with increase oxygen requirements. ?Chest film with new right lower lobe infiltrate, suspected aspiration pneumonia.  ?Started on antibiotic therapy.  ? ?04/11 improving oxygenation, with decreased 02  requirements from 30 L/min with 100% fi02 per heated high flow nasal.  ? ?04/14 chest film with right lower lobe infiltrate and left base atelectasis.  ?Positive congestion, with bilateral hilar vascular congestion.  ?Continue with high oxygen requirements.  ? ?Patient continue diuresing with improvement in oxygenation, completed antibiotic therapy ?Wean off from heated high flow nasal cannula to a regular high flow nasal cannula with good toleration.  ? ?04/20 continue to improve oxygen requirements.  ?He has been out of bed to chair.  ?Plan to discharge home with home health services.  ? ?Assessment and Plan: ?* Acute on chronic diastolic CHF (congestive heart failure) ([place]) ?Echocardiogram with LV systolic function preserved EF 60 to 48%, RV systolic function preserved. Right atrial severe dilatation. (poor acoustic windows)  ? ?04/07 chest radiograph with bilateral interstitial infiltrates with hilar vascular congestion, positive hyperinflation. (personally reviewed).  ? ?04/09 chest film with improved pulmonary edema but new right lower lobe infiltrate.  ? ?04/14 Chest radiograph with right lower lobe infiltrate and pulmonary edema.  ? ?Urine output is 3,500 ml over last 24 hrs.   ?Systolic blood pressure has been 106  MmHg range.   ? ?Continue with metoprolol and spironolactone.  ?Change furosemide to 40 mg daily.  ?Continue to encourage out of bed to chair  ? ? ? ? ?Aspiration pneumonia ([place]) ?Acute on chronic hypoxemic respiratory failure. ?Severe sepsis not present on admission   ? ?Patient with aspiration pneumonia, not present on admission.  ?Chest film/CT chest personally reviewed noted right lower lobe infiltrate.  ? ?Patient required short term non invasive mechanical ventilation and transitioned to heated  high flow nasal cannula.  ? ?Patient has completed antibiotic therapy with Unasyn.  ?Improving oxygen requirements today down to 5 L/min per regular high flow nasal cannula.  ?Keep oxygen saturation  88% or greater.  ? ?Continue with bronchodilator therapy ?Out of bed to chair tid with meals.  ?Airway clearing techniques with flutter valve and incentive spiometer.   ? ?Acute metabolic encephalopathy due to sepsis, he did received haloperidol one time.  ?Encephalopathy has resolved.  ? ?COPD with acute exacerbation ([place]) ?Acute exacerbation.  ?Continue with systemic and inhaled steroids. ?Continue with prednisone to 20 mg daily.  ?On bronchodilatory therapy.  ?Out of bed to chair and continue Pt and OT ? ?At home on supplemental -02 per [place] 3 to 4 L/min.  ? ? ?A-fib ([place]) ?Home medications include metoprolol XL 50 mg and diltiazem 180 mg. ? ?Persistent atrial fibrillation on telemetry monitoring. ?Telemetry personally reviewed with atrial fibrillation with rate in the 100 range.  ? ?Continue rate control with diltiazem 240 mg and metoprolol 50 mg po bid  ? ?Anticoagulation with warfarin per pharmacy protocol.  ?INR is  2.7  ? ?Acute kidney injury superimposed on chronic kidney disease ([place]) ?CKD stage 2, Hyponatremia. ? ?Renal function with serum cr at 1,25 with K at 4,4 and serum bicarbonate at 31. ?His volume has improved.  ?Plan to decrease furosemide to 40 mg daily and follow up renal function in am. ?Avoid hypotension and nephrotoxic medications.  ? ?Essential hypertension ?Blood pressure continue to be well controlled. ?Patient on AV blockade for atrial fibrillation and diuresis with furosemide.  ? ?Rheumatoid arthritis ([place]) ?No active flare.  ?Follow up as outpatient.  ? ?Peripheral polyneuropathy ?Stable, continue medical therapy with gabapenitn.  ? ?Anemia ?Thrombocytopenia ?Likely anemia of chronic disease combined with  ?Iron deficiency anemia now sp IV iron.  ? ? ?Class 1 obesity ?Calculated BMI is 31.5 ? ? ? ? ? ?  ? ?Subjective: Patient is feeling better, his dyspnea is improving, no chest pain or palpitations  ? ?Physical Exam: ?Vitals:  ? 06/09/21 0414 06/09/21 0841 06/09/21 0845 06/09/21 1043   ?BP: 101/60  106/76 98/60  ?Pulse: 81  91 81  ?Resp: 19  13 18   ?Temp: 97.6 ?F (36.4 ?C)  (!) 97.4 ?F (36.3 ?C) 98 ?F (36.7 ?C)  ?TempSrc: Oral  Oral Oral  ?SpO2: 95% 98% 97% 92%  ?Weight: 100.5 kg     ?Height:      ? ?Neurology awake and alert ?ENT with mild pallor ?Cardiovascular with S1 and S2 present irregularly irregular with no gallops, rubs or murmurs ?No JVD ?Trace pitting bilateral lower extremity edema ?Respiratory with bilateral rales and scattered rhonchi ?Abdomen soft and not distended  ?Data Reviewed: ? ? ? ?Family Communication: no family at the bedside  ? ?Disposition: ?Status is: Inpatient ?Remains inpatient appropriate because: heart failure and respiratory failure  ? Planned Discharge Destination: Home ? ?Author: ?Tawni Millers, MD ?06/09/2021 10:52 AM ? ?For on call review [email or web address]  ?

## 2021-06-10 ENCOUNTER — Other Ambulatory Visit (HOSPITAL_COMMUNITY): Payer: Self-pay

## 2021-06-10 ENCOUNTER — Ambulatory Visit: Payer: Medicare Other | Admitting: Internal Medicine

## 2021-06-10 DIAGNOSIS — J690 Pneumonitis due to inhalation of food and vomit: Secondary | ICD-10-CM | POA: Diagnosis not present

## 2021-06-10 DIAGNOSIS — N179 Acute kidney failure, unspecified: Secondary | ICD-10-CM | POA: Diagnosis not present

## 2021-06-10 DIAGNOSIS — J441 Chronic obstructive pulmonary disease with (acute) exacerbation: Secondary | ICD-10-CM | POA: Diagnosis not present

## 2021-06-10 DIAGNOSIS — I5033 Acute on chronic diastolic (congestive) heart failure: Secondary | ICD-10-CM | POA: Diagnosis not present

## 2021-06-10 LAB — PROTIME-INR
INR: 2 — ABNORMAL HIGH (ref 0.8–1.2)
Prothrombin Time: 22.8 seconds — ABNORMAL HIGH (ref 11.4–15.2)

## 2021-06-10 LAB — CBC
HCT: 31.6 % — ABNORMAL LOW (ref 39.0–52.0)
Hemoglobin: 9.9 g/dL — ABNORMAL LOW (ref 13.0–17.0)
MCH: 34.3 pg — ABNORMAL HIGH (ref 26.0–34.0)
MCHC: 31.3 g/dL (ref 30.0–36.0)
MCV: 109.3 fL — ABNORMAL HIGH (ref 80.0–100.0)
Platelets: 216 10*3/uL (ref 150–400)
RBC: 2.89 MIL/uL — ABNORMAL LOW (ref 4.22–5.81)
RDW: 15.9 % — ABNORMAL HIGH (ref 11.5–15.5)
WBC: 5.8 10*3/uL (ref 4.0–10.5)
nRBC: 1 % — ABNORMAL HIGH (ref 0.0–0.2)

## 2021-06-10 LAB — BASIC METABOLIC PANEL
Anion gap: 7 (ref 5–15)
BUN: 45 mg/dL — ABNORMAL HIGH (ref 8–23)
CO2: 34 mmol/L — ABNORMAL HIGH (ref 22–32)
Calcium: 8.7 mg/dL — ABNORMAL LOW (ref 8.9–10.3)
Chloride: 94 mmol/L — ABNORMAL LOW (ref 98–111)
Creatinine, Ser: 1.34 mg/dL — ABNORMAL HIGH (ref 0.61–1.24)
GFR, Estimated: 55 mL/min — ABNORMAL LOW (ref >60)
Glucose, Bld: 135 mg/dL — ABNORMAL HIGH (ref 70–99)
Potassium: 4.5 mmol/L (ref 3.5–5.1)
Sodium: 135 mmol/L (ref 135–145)

## 2021-06-10 MED ORDER — DILTIAZEM HCL ER COATED BEADS 240 MG PO CP24
240.0000 mg | ORAL_CAPSULE | Freq: Every day | ORAL | 0 refills | Status: AC
  Filled 2021-06-10: qty 30, 30d supply, fill #0

## 2021-06-10 MED ORDER — EMPAGLIFLOZIN 10 MG PO TABS
10.0000 mg | ORAL_TABLET | Freq: Every day | ORAL | 0 refills | Status: AC
  Filled 2021-06-10: qty 14, 14d supply, fill #0

## 2021-06-10 MED ORDER — WARFARIN SODIUM 5 MG PO TABS
5.0000 mg | ORAL_TABLET | Freq: Once | ORAL | Status: AC
  Administered 2021-06-10: 5 mg via ORAL
  Filled 2021-06-10: qty 1

## 2021-06-10 MED ORDER — METOPROLOL TARTRATE 50 MG PO TABS
50.0000 mg | ORAL_TABLET | Freq: Two times a day (BID) | ORAL | 0 refills | Status: AC
  Filled 2021-06-10: qty 60, 30d supply, fill #0

## 2021-06-10 MED ORDER — SPIRONOLACTONE 25 MG PO TABS
25.0000 mg | ORAL_TABLET | Freq: Every day | ORAL | 0 refills | Status: AC
  Filled 2021-06-10: qty 30, 30d supply, fill #0

## 2021-06-10 MED ORDER — FUROSEMIDE 40 MG PO TABS
40.0000 mg | ORAL_TABLET | Freq: Every day | ORAL | Status: DC
  Administered 2021-06-11: 40 mg via ORAL
  Filled 2021-06-10: qty 1

## 2021-06-10 NOTE — Progress Notes (Signed)
ANTICOAGULATION CONSULT NOTE - Follow Up Consult ? ?Pharmacy Consult for Warfarin ?Indication: atrial fibrillation ? ?Allergies  ?Allergen Reactions  ? Tape Other (See Comments)  ?  Patient takes Coumadin!! Tape BRUISES and TEARS THE SKIN  ? Propoxyphene Nausea And Vomiting  ? ? ?Patient Measurements: ?Height: 6\' 1"  (185.4 cm) ?Weight: 98.8 kg (217 lb 13 oz) ?IBW/kg (Calculated) : 79.9 ? ?Vital Signs: ?Temp: 97.5 ?F (36.4 ?C) (04/21 6712) ?Temp Source: Oral (04/21 4580) ?BP: 113/75 (04/21 9983) ?Pulse Rate: 100 (04/21 0808) ? ?Labs: ?Recent Labs  ?  06/08/21 ?0130 06/09/21 ?0109 06/10/21 ?3825  ?HGB 8.5*  --  9.9*  ?HCT 26.3*  --  31.6*  ?PLT 208  --  216  ?LABPROT 35.1* 28.3* 22.8*  ?INR 3.5* 2.7* 2.0*  ?CREATININE 1.11 1.25* 1.34*  ? ? ? ?Estimated Creatinine Clearance: 57.1 mL/min (A) (by C-G formula based on SCr of 1.34 mg/dL (H)). ? ?Assessment: ?76 y/o M admitted 05/25/21 with heart failure exacerbation. Continues on warfarin as PTA for afib. Pharmacy consulted for inpatient warfarin dosing. INR within therapeutic range and CBC stable. ? ?PTA dose  Warfarin 7.5 mg MWFSun and 5 mg TTSat. ? ?Goal of Therapy:  ?INR 2-3 ?Monitor platelets by anticoagulation protocol: Yes ?  ?Plan:  ?Warfarin 5mg  x1 again today  ?Daily PT/INR and CBC  ? ? ?Nevada Crane, Pharm D, BCPS, BCCP ?Clinical Pharmacist ? 06/10/2021 8:36 AM  ? ?[facility] pharmacy phone numbers are listed on [email or web address] ? ? ? ? ?

## 2021-06-10 NOTE — Progress Notes (Signed)
Heart Failure Stewardship Pharmacist Progress Note ? ? ?PCP: Glendon Axe, MD ?PCP-Cardiologist: Elouise Munroe, MD  ? ? ?HPI:  ?76 yo M with PMH of CHF, cor pulmonale, CAD, HTN, afib, AAA, NSCLC s/p radiation, RA, CKD, tobacco use, and asthma/COPD. He presented to the ED on 4/5 with worsening LE edema, weight gain, dyspnea, and orthopnea. CXR with no evidence of acute cardiopulmonary disease. An ECHO was done on 4/6 and LVEF is 60-65% with moderate LVH and low normal RV function. Hospital course complicated by PNA and significant AKI with have both been resolved. Anticipated discharge tomorrow. ? ?Current HF Medications: ?Beta Blocker: metoprolol tartrate 50 mg BID ?Aldosterone Antagonist: spironolactone 25 mg daily ?SGLT2i: Jardiance 10 mg daily ?Other: Ferrlecit 250 mg IV daily x 2;  Feraheme 510 mg x 1 ? ?Prior to admission HF Medications: ?Diuretic: furosemide 20 mg PRN ? ?Pertinent Lab Values: ?Serum creatinine 1.34, BUN 45, Potassium 4.5, Sodium 135, BNP 570.9 ?TSAT 9, Ferritin 307 ? ?Vital Signs: ?Weight: 217 lbs (admission weight: 243 lbs) ?Blood pressure: 110/60s  ?Heart rate: 80-100s  ?I/O: -3.8L yesterday; net -34.3L ? ?Medication Assistance / Insurance Benefits Check: ?Does the patient have prescription insurance?  Yes ?Type of insurance plan: [employer] ? ?Outpatient Pharmacy:  ?[facility] ?Is the patient willing to use [facility] pharmacy at discharge? Yes ?Is the patient willing to transition their outpatient pharmacy to utilize a [facility] outpatient pharmacy?   Pending ?  ? ?Assessment: ?1. Acute on chronic diastolic CHF (EF 16-10%) due to multifactorial NICM (COPD, OSA, tobacco use, cor pulmonale). NYHA class II symptoms. ?- Continue furosemide 40 mg PO daily ?- Continue metoprolol tartrate 50 mg BID - consider increasing to 75 mg BID with tachycardia. ?- Continue spironolactone 25 mg daily ?- Continue Jardiance 10 mg daily ?- S/p Ferrlicit 960 mg  IV x 2 and Feraheme 510 mg IV x 1 ?  ?Plan: ?1) Medication changes recommended at this time: ?- Increase metoprolol to 75 mg BID ?- Send discharge meds to [facility] [place] today in anticipation of weekend discharge ? ?2) Patient assistance: ?- $374.23 remaining on deductible ? ?3)  Education  ?- Patient has been educated on current HF medications and potential additions to HF medication regimen ?- Patient verbalizes understanding that over the next few months, these medication doses may change and more medications may be added to optimize HF regimen ?- Patient has been educated on basic disease state pathophysiology and goals of therapy ? ? ?Kerby Nora, PharmD, BCPS ?Heart Failure Stewardship Pharmacist ?Phone [phone] ? ? ?

## 2021-06-10 NOTE — Progress Notes (Addendum)
?Progress Note ? ? ?Patient: Jake [name] IEP:[id number] DOB: 1945/07/21 DOA: 05/25/2021     16 ?DOS: the patient was seen and examined on 06/10/2021 ?  ?Brief hospital course: ?Jake [name] was admitted to the hospital with the working diagnosis of decompensated diastolic heart failure.  ?Hospitalization complicated with right lower lobe aspiration pneumonia and sepsis.  ? ?76 yo male with the past medical history of heart failure, COPD, hypertension, atrial fibrillation and asthma who presented with dyspnea and lower extremity edema. Reported worsening lower extremity edema for the last 6 weeks, he has gain 20 over last 2 weeks. Symptoms associated with dyspnea and 2 pillow orthopnea. On the day of admission home health nurse advice to come to the ED due worsening symptoms. On his initial physical examination his blood pressure is 101/57, HR 88, RR 16 and 02 saturation 92%, lungs with decreased breath sounds bilaterally, positive rales and wheezing, increased work of breathing, heart with S1 and S2 present and rhythmic, abdomen not distended and positive lower extremity edema ++.  ? ?Na 137, K 4,1 Cl 98, bicarbonate 32, glucose 162, bun 27, cr 1,2 ?BNP 570 ?Troponin 29-29  ?Wbc 6.3 hgb 10,0 hct 32, plt 91  ?INR 2,0  ? ?Chest radiograph with no cardiomegaly, bilateral interstitial infiltrates, small right pleural effusion.  ? ?EKG with 87 bpm, left axis deviation, normal intervals, atrial fibrillation with PVC, q wave V1 and V2 with no significant ST segment or T wave changes.  ? ?Patient was placed on IV furosemide for diuresis with good toleration. ?He had rapid ventricular response due to atrial fibrillation.   ?AV blockade was adjusted with diltiazem and metoprolol.  ? ?04/09 patient had worsening respiratory failure, with increase oxygen requirements. ?Chest film with new right lower lobe infiltrate, suspected aspiration pneumonia.  ?Started on antibiotic therapy.  ? ?04/11 improving oxygenation, with decreased 02  requirements from 30 L/min with 100% fi02 per heated high flow nasal.  ? ?04/14 chest film with right lower lobe infiltrate and left base atelectasis.  ?Positive congestion, with bilateral hilar vascular congestion.  ?Continue with high oxygen requirements.  ? ?Patient continue diuresing with improvement in oxygenation, completed antibiotic therapy ?Wean off from heated high flow nasal cannula to a regular high flow nasal cannula with good toleration.  ? ?04/20 continue to improve oxygen requirements.  ?He has been out of bed to chair.  ?Plan to discharge home with home health services.  ? ?04/21 plan to transition to regular nasal cannula, if good toleration plan for discharge on 04/22 home.  ? ?Assessment and Plan: ?* Acute on chronic diastolic CHF (congestive heart failure) ([place]) ?Echocardiogram with LV systolic function preserved EF 60 to 66%, RV systolic function preserved. Right atrial severe dilatation. (poor acoustic windows)  ? ?04/07 chest radiograph with bilateral interstitial infiltrates with hilar vascular congestion, positive hyperinflation. (personally reviewed).  ? ?04/09 chest film with improved pulmonary edema but new right lower lobe infiltrate.  ? ?04/14 Chest radiograph with right lower lobe infiltrate and pulmonary edema.  ? ?Urine output is 3,375 ml over last 24 hrs.   ?Systolic blood pressure has been 101 to 113  mmHg range.   ? ?Plan to continue with metoprolol and spironolactone.  ?Holding on ras inhibition due to risk of renal failure.  ?Hold furosemide for today. ? ? ? ? ?Aspiration pneumonia ([place]) ?Acute on chronic hypoxemic respiratory failure. ?Severe sepsis not present on admission   ? ?Patient with aspiration pneumonia, not present on admission.  ?Chest film/CT  chest personally reviewed noted right lower lobe infiltrate.  ? ?Patient required short term non invasive mechanical ventilation and transitioned to heated high flow nasal cannula.  ? ?Patient has completed antibiotic therapy  with Unasyn.  ?Improving oxygen requirements today down to 5 L/min per regular high flow nasal cannula.  ? ?Plan to transition to regular nasal cannula, keep 02 saturation 88% or greater.  ? ?Continue with bronchodilator therapy ?Out of bed to chair tid with meals.  ?Airway clearing techniques with flutter valve and incentive spiometer.   ? ?Acute metabolic encephalopathy due to sepsis, he did received haloperidol one time.  ?Encephalopathy has resolved.  ? ?COPD with acute exacerbation ([place]) ?Acute exacerbation.  ?Continue with systemic and inhaled steroids. ?Discontinue steroids.  ?Continue with bronchodilatory therapy.  ?Out of bed to chair and continue Pt and OT ? ?At home on supplemental -02 per  3 to 4 L/min, plan to transition to regular nasal cannula today.  ? ? ?A-fib ([place]) ?Home medications include metoprolol XL 50 mg and diltiazem 180 mg. ? ?Continue in atrial fibrillation on telemetry monitoring. ?Telemetry personally reviewed with atrial fibrillation with rate in the 100 range.  ? ?On diltiazem 240 mg and metoprolol 50 mg po bid  ? ?Anticoagulation with warfarin per pharmacy protocol.  ?INR is  2.0  ? ?Acute kidney injury superimposed on chronic kidney disease ([place]) ?CKD stage 2, Hyponatremia. ? ?Patient with improvement in volume status, his renal function today with a serum cr of 1,34 with K at 4,5 and serum bicarbonate at 34. ?Na 135.  ? ?Plan to hod on furosemide for today and follow up renal function in am.  ?Avoid hypotension and nephrotoxic medications.  ? ?Essential hypertension ?Blood pressure continue to be well controlled. ?Patient on AV blockade for atrial fibrillation and diuresis with furosemide.  ? ?Rheumatoid arthritis ([place]) ?No active flare.  ?Follow up as outpatient.  ? ?Peripheral polyneuropathy ?Stable, continue medical therapy with gabapenitn.  ? ?Anemia ?Thrombocytopenia ?Likely anemia of chronic disease combined with  ?Iron deficiency anemia now sp IV iron.  ? ? ?Class 1  obesity ?Calculated BMI is 31.5 ? ? ? ? ? ?  ? ?Subjective: Patient this am is out of bed to the chair, no dyspnea or chest pain, edema has improve.  ? ?Physical Exam: ?Vitals:  ? 06/10/21 8101 06/10/21 0542 06/10/21 0734 06/10/21 7510  ?BP: 101/64   113/75  ?Pulse: 93  (!) 108 100  ?Resp: 19  18 17   ?Temp: 97.6 ?F (36.4 ?C)   (!) 97.5 ?F (36.4 ?C)  ?TempSrc: Oral     ?SpO2: 96%  96% 90%  ?Weight:  98.8 kg    ?Height:      ? ?Neurology awake and alert ?ENT with no pallor ?Cardiovascular with S1 and S2 present irregularly irregular with no gallops, rubs or murmurs ?Respiratory with mild expiratory wheezing bilaterally with no rhonchi or rales ?Abdomen not distended ?Treace lower extremity edema  ?Data Reviewed: ? ? ? ?Family Communication: I spoke over the phone with the patient's daughter about patient's  condition, plan of care, prognosis and all questions were addressed.  ?Disposition: ?Status is: Inpatient ?Remains inpatient appropriate because: respiratory failure  ? Planned Discharge Destination: Home ? ? ? ? ?Author: ?Tawni Millers, MD ?06/10/2021 8:52 AM ? ?For on call review [email or web address].  ?

## 2021-06-10 NOTE — Plan of Care (Signed)

## 2021-06-10 NOTE — Plan of Care (Signed)
?  Problem: Education: ?Goal: Ability to demonstrate management of disease process will improve ?Outcome: Progressing ?Goal: Ability to verbalize understanding of medication therapies will improve ?Outcome: Progressing ?  ?Problem: Activity: ?Goal: Capacity to carry out activities will improve ?Outcome: Progressing ?  ?Problem: Cardiac: ?Goal: Ability to achieve and maintain adequate cardiopulmonary perfusion will improve ?Outcome: Progressing ?  ?Problem: Education: ?Goal: Knowledge of General Education information will improve ?Description: Including pain rating scale, medication(s)/side effects and non-pharmacologic comfort measures ?Outcome: Progressing ?  ?

## 2021-06-10 NOTE — Progress Notes (Signed)
Physical Therapy Treatment ?Patient Details ?Name: Jake [name] ?MRN: [id number] ?DOB: 1945/07/03 ?Today's Date: 06/10/2021 ? ? ?History of Present Illness Pt is a 76 y.o. M who presents 05/25/2021 with working diagnosis of decompensated heart failure. Significant PMH: heart failure, COPD, HTN, atrial fibrillation, asthma, peripheral polyneuropathy, RA. ? ?  ?PT Comments  ? ? Pt received in supine, agreeable to therapy session with good tolerance for functional mobility tasks. Pt SpO2 WFL on 4L O2 [place] with gait trial and needing up to min guard with RW support for greater than household distances. Pt agreeable to sit up in chair post-exertion. Pt continues to benefit from PT services to progress toward functional mobility goals.    ?Recommendations for follow up therapy are one component of a multi-disciplinary discharge planning process, led by the attending physician.  Recommendations may be updated based on patient status, additional functional criteria and insurance authorization. ? ?Follow Up Recommendations ? Home health PT ?  ?  ?Assistance Recommended at Discharge Intermittent Supervision/Assistance  ?Patient can return home with the following A little help with walking and/or transfers;A little help with bathing/dressing/bathroom;Assist for transportation;Assistance with cooking/housework ?  ?Equipment Recommendations ? None recommended by PT  ?  ?Recommendations for Other Services   ? ? ?  ?Precautions / Restrictions Precautions ?Precautions: Fall;Other (comment) ?Precaution Comments: watch HR, BP, and SpO2 ?Restrictions ?Weight Bearing Restrictions: No  ?  ? ?Mobility ? Bed Mobility ?Overal bed mobility: Needs Assistance ?Bed Mobility: Supine to Sit, Sit to Supine ?  ?  ?Supine to sit: Supervision, HOB elevated ?  ?  ?General bed mobility comments: Supervision for safety and line management. ?  ? ?Transfers ?Overall transfer level: Needs assistance ?Equipment used: Rolling walker (2 wheels) ?Transfers: Sit  to/from Stand ?Sit to Stand: Min guard ?  ?  ?  ?  ?  ?General transfer comment: Min guard assist for safety from EOB and to chair ?  ? ?Ambulation/Gait ?Ambulation/Gait assistance: Supervision ?Gait Distance (Feet): 250 Feet ?Assistive device: Rolling walker (2 wheels) ?Gait Pattern/deviations: Step-through pattern, Decreased stride length, Trunk flexed, Narrow base of support ?Gait velocity: decreased ?  ?  ?General Gait Details: Slow, steady gait with use of RW, no LOB, supervision for safety. SpO2 WFL on 4L O2 [place]. ? ? ?Stairs ?  ?  ?  ?  ?  ? ? ?Wheelchair Mobility ?  ? ?Modified Rankin (Stroke Patients Only) ?  ? ? ?  ?Balance Overall balance assessment: Needs assistance ?Sitting-balance support: Feet supported, No upper extremity supported ?Sitting balance-Leahy Scale: Good ?  ?  ?Standing balance support: Bilateral upper extremity supported, During functional activity ?Standing balance-Leahy Scale: Poor ?Standing balance comment: reliant on RW, minor R sided LOB corrected with RW and min guard ?  ?  ?  ?  ?  ?  ?  ?  ?  ?  ?  ?  ? ?  ?Cognition Arousal/Alertness: Awake/alert ?Behavior During Therapy: [facility] for tasks assessed/performed ?Overall Cognitive Status: Within Functional Limits for tasks assessed ?  ?  ?  ?  ?  ?  ?  ?  ?  ?  ?  ?  ?  ?  ?  ?  ?General Comments: pleasantly cooperative, mild STM deficits ?  ?  ? ?  ?Exercises   ? ?  ?General Comments General comments (skin integrity, edema, etc.): SpO2 >88% throughout on 4L O2 [place]; HR WFl ?  ?  ? ?Pertinent Vitals/Pain Pain Assessment ?Pain Assessment: No/denies pain ?Pain  Intervention(s): Monitored during session, Repositioned  ? ? ?Home Living   ?  ?  ?  ?  ?  ?  ?  ?  ?  ?   ?  ?Prior Function    ?  ?  ?   ? ?PT Goals (current goals can now be found in the care plan section) Acute Rehab PT Goals ?Patient Stated Goal: to wean his O2 ?PT Goal Formulation: With patient ?Time For Goal Achievement: 06/11/21 ?Progress towards PT goals: Progressing toward  goals ? ?  ?Frequency ? ? ? Min 3X/week ? ? ? ?  ?PT Plan Current plan remains appropriate  ? ? ?   ?AM-PAC PT "6 Clicks" Mobility   ?Outcome Measure ? Help needed turning from your back to your side while in a flat bed without using bedrails?: None ?Help needed moving from lying on your back to sitting on the side of a flat bed without using bedrails?: A Little ?Help needed moving to and from a bed to a chair (including a wheelchair)?: A Little ?Help needed standing up from a chair using your arms (e.g., wheelchair or bedside chair)?: A Little ?Help needed to walk in hospital room?: A Little ?Help needed climbing 3-5 steps with a railing? : A Little ?6 Click Score: 19 ? ?  ?End of Session Equipment Utilized During Treatment: Oxygen;Gait belt ?Activity Tolerance: Patient tolerated treatment well ?Patient left: with call bell/phone within reach;in chair ?Nurse Communication: Mobility status ?PT Visit Diagnosis: Unsteadiness on feet (R26.81);Difficulty in walking, not elsewhere classified (R26.2);Other abnormalities of gait and mobility (R26.89) ?  ? ? ?Time: 9924-2683 ?PT Time Calculation (min) (ACUTE ONLY): 17 min ? ?Charges:  $Gait Training: 8-22 mins          ?          ? ?Dandre Sisler P., PTA ?Acute Rehabilitation Services ?Secure Chat Preferred 9a-5:30pm ?Office: [phone]  ? ? ?Kara Pacer Chanin Frumkin ?06/10/2021, 6:18 PM ? ?

## 2021-06-11 DIAGNOSIS — I5033 Acute on chronic diastolic (congestive) heart failure: Secondary | ICD-10-CM | POA: Diagnosis not present

## 2021-06-11 DIAGNOSIS — J690 Pneumonitis due to inhalation of food and vomit: Secondary | ICD-10-CM | POA: Diagnosis not present

## 2021-06-11 DIAGNOSIS — J441 Chronic obstructive pulmonary disease with (acute) exacerbation: Secondary | ICD-10-CM | POA: Diagnosis not present

## 2021-06-11 DIAGNOSIS — N179 Acute kidney failure, unspecified: Secondary | ICD-10-CM | POA: Diagnosis not present

## 2021-06-11 LAB — BASIC METABOLIC PANEL
Anion gap: 7 (ref 5–15)
BUN: 38 mg/dL — ABNORMAL HIGH (ref 8–23)
CO2: 33 mmol/L — ABNORMAL HIGH (ref 22–32)
Calcium: 8.6 mg/dL — ABNORMAL LOW (ref 8.9–10.3)
Chloride: 95 mmol/L — ABNORMAL LOW (ref 98–111)
Creatinine, Ser: 1.18 mg/dL (ref 0.61–1.24)
GFR, Estimated: >60 mL/min (ref >60)
Glucose, Bld: 94 mg/dL (ref 70–99)
Potassium: 4.7 mmol/L (ref 3.5–5.1)
Sodium: 135 mmol/L (ref 135–145)

## 2021-06-11 LAB — CBC
HCT: 30.5 % — ABNORMAL LOW (ref 39.0–52.0)
Hemoglobin: 9.5 g/dL — ABNORMAL LOW (ref 13.0–17.0)
MCH: 34.4 pg — ABNORMAL HIGH (ref 26.0–34.0)
MCHC: 31.1 g/dL (ref 30.0–36.0)
MCV: 110.5 fL — ABNORMAL HIGH (ref 80.0–100.0)
Platelets: 206 10*3/uL (ref 150–400)
RBC: 2.76 MIL/uL — ABNORMAL LOW (ref 4.22–5.81)
RDW: 16.4 % — ABNORMAL HIGH (ref 11.5–15.5)
WBC: 5.3 10*3/uL (ref 4.0–10.5)
nRBC: 0.4 % — ABNORMAL HIGH (ref 0.0–0.2)

## 2021-06-11 LAB — PROTIME-INR
INR: 1.9 — ABNORMAL HIGH (ref 0.8–1.2)
Prothrombin Time: 21.3 seconds — ABNORMAL HIGH (ref 11.4–15.2)

## 2021-06-11 MED ORDER — FUROSEMIDE 40 MG PO TABS: 40.0000 mg | ORAL_TABLET | Freq: Every day | ORAL | 0 refills | Status: DC

## 2021-06-11 NOTE — Progress Notes (Signed)
TRH night cross cover note: ? ?I was notified by RN of patient's blood pressure of 83/48, with recheck of blood pressure, without interval interventions, showing blood pressure x2 of 97/57 mmHg.  Patient reportedly asymptomatic at this time. ? ?Per my chart review, including review of most recent rounding hospitalist progress note, this is a 76 year old male admitted with suspected acute on chronic diastolic heart failure, who has been undergoing diuresis, although diuresis efforts held on 06/10/2021 after greater than 3 L output of urine over the preceding 24 hours, with close monitoring of ensuing renal function.  ? ?In reviewing preceding blood pressures, BP of 86/54 noted at 2330, as well as blood pressure 90/71 at 1600 on 06/10/2021.  With patient's existing blood pressure, per recheck, appears stable relative to his previous values in this patient without any acute complaints at this time, we will continue to monitor ensuing blood pressure trend, while refraining from IV fluids at this time given his hospitalization for suspected acute on chronic diastolic heart failure. ? ? ? ? ?Babs Bertin, DO ?Hospitalist ? ?

## 2021-06-11 NOTE — Progress Notes (Signed)
?   06/11/21 0328  ?Assess: MEWS Score  ?BP (!) 83/48  ?Assess: MEWS Score  ?MEWS Temp 0  ?MEWS Systolic 1  ?MEWS Pulse 0  ?MEWS RR 1  ?MEWS LOC 0  ?MEWS Score 2  ?MEWS Score Color Yellow  ?Assess: if the MEWS score is Yellow or Red  ?Were vital signs taken at a resting state? Yes  ?Focused Assessment No change from prior assessment  ?Early Detection of Sepsis Score *See Row Information* Low  ?MEWS guidelines implemented *See Row Information* Yes  ?Treat  ?MEWS Interventions Escalated (See documentation below)  ?Pain Scale 0-10  ?Pain Score 0  ?Take Vital Signs  ?Increase Vital Sign Frequency  Yellow: Q 2hr X 2 then Q 4hr X 2, if remains yellow, continue Q 4hrs  ?Escalate  ?MEWS: Escalate Yellow: discuss with charge nurse/RN and consider discussing with provider and RRT  ?Notify: Charge Nurse/RN  ?Name of Charge Nurse/RN Notified Rondall Allegra, RN  ?Date Charge Nurse/RN Notified 06/11/21  ?Time Charge Nurse/RN Notified [phone]  ?Notify: Provider  ?Provider Name/Title Dr. Velia Meyer  ?Date Provider Notified 06/11/21  ?Time Provider Notified [phone]  ?Notification Type Page  ?Notification Reason Change in status  ? ? ?

## 2021-06-11 NOTE — Plan of Care (Signed)

## 2021-06-11 NOTE — TOC Transition Note (Addendum)
Transition of Care (TOC) - CM/SW Discharge Note ? ? ?Patient Details  ?Name: Jake [name] ?MRN: [id number] ?Date of Birth: 06/15/1945 ? ?Transition of Care (TOC) CM/SW Contact:  ?Konrad Penta, RN ?Phone Number: [phone] ?06/11/2021, 10:01 AM ? ? ?Clinical Narrative:   Spoke with Jake [name] regarding TOC needs. Jake [name] reports he lives with his wife and grandson. Wears home oxygen provided by [employer]. Will need portable tank for transition home. Daughter to pick Jake [name] up upon hospital dc. Jake [name] reports he has all the DME he needs at home.  ? ?Contacted Sharmon Revere with [facility] to make aware Jake [name] is returning home today. ?Spoke with [facility] with [employer] to request portable oxygen tank be delivered to patient's room prior to dc. ? ?No further TOC needs identified. Please re-consult if needed.  ? ? ? ?Final next level of care: [place] ?Barriers to Discharge: No Barriers Identified ? ? ?Patient Goals and CMS Choice ?Patient states their goals for this hospitalization and ongoing recovery are:: return home ?CMS Medicare.gov Compare Post Acute Care list provided to:: Patient ?Choice offered to / list presented to : Patient ? ?Discharge Placement ?  ?           ?  ?  ?  ?  ? ?Discharge Plan and Services ?  ?  ?           ?DME Arranged: Oxygen (wore home 02 prior to admission with [employer]) ?DME Agency: [facility] ?Date DME Agency Contacted: 06/11/21 ?Time DME Agency Contacted: 1000 ?Representative spoke with at DME Agency: Delana Meyer ?HH Arranged: RN, PT, OT (active with [facility] prior to admission) ?[place] Agency: [place] ?Date HH Agency Contacted: 06/11/21 ?Time [place]: 1001 ?Representative spoke with at [place]: Sharmon Revere ? ?Social Determinants of Health (SDOH) Interventions ?Food Insecurity Interventions: Intervention Not Indicated ?Financial Strain Interventions: Intervention Not Indicated ?Housing Interventions: Intervention Not  Indicated ?Transportation Interventions: Intervention Not Indicated ? ? ?Readmission Risk Interventions ?   ? View : No data to display.  ?  ?  ?  ? ? ? ? ? ?

## 2021-06-11 NOTE — Progress Notes (Signed)
?   06/11/21 0342  ?Assess: MEWS Score  ?BP (!) 97/57  ?Pulse Rate 83  ?ECG Heart Rate 87  ?Resp 18  ?SpO2 94 %  ?Assess: MEWS Score  ?MEWS Temp 0  ?MEWS Systolic 1  ?MEWS Pulse 0  ?MEWS RR 0  ?MEWS LOC 0  ?MEWS Score 1  ?MEWS Score Color Green  ?Assess: if the MEWS score is Yellow or Red  ?Were vital signs taken at a resting state? Yes  ?Focused Assessment No change from prior assessment  ?Early Detection of Sepsis Score *See Row Information* Low  ?MEWS guidelines implemented *See Row Information* No, vital signs rechecked  ?Notify: Provider  ?Provider response No new orders  ?Date of Provider Response 06/11/21  ?Time of Provider Response [phone]  ? ? ?

## 2021-06-11 NOTE — Discharge Summary (Signed)
?Physician Discharge Summary ?  ?Patient: Jake [name] MRN: [id number] DOB: 1946-01-19  ?Admit date:     05/25/2021  ?Discharge date: 06/11/21  ?Discharge Physician: Jake [name]  ? ?PCP: Jake Axe, MD  ? ?Recommendations at discharge:  ? ? Patient has been placed on spironolactone and empagliflozin for heart failure ?Increased furosemide to 40 mg daily ?Increased diltiazem to 240 mg daily and metoprolol to 50 mg  bid ?Follow renal function and electrolytes in 7 days as outpatient ?Follow up with primary care in 7 to 10 days.  ? ?I spoke over the phone with the patient's doughter about patient's  condition, plan of care, prognosis and all questions were addressed.  ? ?Discharge Diagnoses: ?Principal Problem: ?  Acute on chronic diastolic CHF (congestive heart failure) ([place]) ?Active Problems: ?  Aspiration pneumonia ([place]) ?  A-fib ([place]) ?  COPD with acute exacerbation ([place]) ?  Acute kidney injury superimposed on chronic kidney disease ([place]) ?  Essential hypertension ?  Peripheral polyneuropathy ?  Rheumatoid arthritis ([place]) ?  Anemia ?  Class 1 obesity ? ?Resolved Problems: ?  * No resolved hospital problems. * ? ?Hospital Course: ?[name] was admitted to the hospital with the working diagnosis of decompensated diastolic heart failure.  ?Hospitalization complicated with right lower lobe aspiration pneumonia and sepsis.  ? ?76 yo male with the past medical history of heart failure, COPD, hypertension, atrial fibrillation and asthma who presented with dyspnea and lower extremity edema. Reported worsening lower extremity edema for the last 6 weeks, he gained 20 over last 2 weeks. Symptoms associated with dyspnea and 2 pillow orthopnea. On the day of admission home health nurse advice to come to the ED due worsening symptoms. On his initial physical examination his blood pressure is 101/57, HR 88, RR 16 and 02 saturation 92%, lungs with decreased breath sounds bilaterally, positive rales and wheezing,  increased work of breathing, heart with S1 and S2 present and rhythmic, abdomen not distended and positive lower extremity edema ++.  ? ?Na 137, K 4,1 Cl 98, bicarbonate 32, glucose 162, bun 27, cr 1,2 ?BNP 570 ?Troponin 29-29  ?Wbc 6.3 hgb 10,0 hct 32, plt 91  ?INR 2,0  ? ?Chest radiograph with no cardiomegaly, bilateral interstitial infiltrates, small right pleural effusion.  ? ?EKG with 87 bpm, left axis deviation, normal intervals, atrial fibrillation with PVC, q wave V1 and V2 with no significant ST segment or T wave changes.  ? ?Patient was placed on IV furosemide for diuresis with good toleration. ?He had rapid ventricular response due to atrial fibrillation.   ?AV blockade was adjusted with diltiazem and metoprolol.  ? ?04/09 patient had worsening respiratory failure, with increase oxygen requirements. ?Chest film with new right lower lobe infiltrate, suspected aspiration pneumonia.  ?Started on antibiotic therapy.  ? ?04/11 improving oxygenation, with decreased 02 requirements from 30 L/min with 100% fi02 per heated high flow nasal.  ? ?04/14 chest film with right lower lobe infiltrate and left base atelectasis.  ?Positive congestion, with bilateral hilar vascular congestion.  ?Continue with high oxygen requirements.  ? ?Patient continue diuresing with improvement in oxygenation, completed antibiotic therapy ?Wean off from heated high flow nasal cannula to a regular high flow nasal cannula with good toleration.  ? ?04/20 continue to improve oxygen requirements.  ?He has been out of bed to chair.  ?Plan to discharge home with home health services.  ? ?04/21 plan to transition to regular nasal cannula. ?04/22 plan to discharge home with  follow up as outpatient.  ? ?Assessment and Plan: ?* Acute on chronic diastolic CHF (congestive heart failure) ([place]) ?Patient was admitted to the progressive care unit and was placed on furosemide for diuresis.  ?Negative fluid balance was achieved, -25,118 ml since admission  with significant improvement in his symptoms.   ? ?Echocardiogram with LV systolic function preserved EF 60 to 96%, RV systolic function preserved. Right atrial severe dilatation. (poor acoustic windows)  ? ?04/07 chest radiograph with bilateral interstitial infiltrates with hilar vascular congestion, positive hyperinflation. (personally reviewed).  ? ?04/09 chest film with improved pulmonary edema but new right lower lobe infiltrate.  ? ?04/14 Chest radiograph with right lower lobe infiltrate and pulmonary edema.  ? ?At the time of his discharge patient is euvolemic, and back to his baseline 02 supplementation of 4 L per min per regular nasal cannula.  ? ?Continue heart failure management with metoprolol, empagliflozin and spironolactone.  ?Resume furosemide 40 mg daily and have close follow up a outpatient.  ? ? ? ? ?Aspiration pneumonia ([place]) ?Acute on chronic hypoxemic respiratory failure. ?Severe sepsis not present on admission   ? ?Patient with aspiration pneumonia, not present on admission.  ?Chest film/CT chest personally reviewed noted right lower lobe infiltrate.  ? ?Patient required short term non invasive mechanical ventilation and transitioned to heated high flow nasal cannula.  ? ?Patient has completed antibiotic therapy with Unasyn.  ? ?He received bronchodilator therapy, airway clearing techniques with flutter valve and incentive spiometer.   ?Out of bed, Pt and Ot. ? ?His oxygen requirements have improved, at the time of his discharge his 02 saturation is 94% on 4 L/min per [place].  ? ?Acute metabolic encephalopathy due to sepsis, he did received haloperidol one time.  ?Encephalopathy has resolved.  ? ?COPD with acute exacerbation ([place]) ?Acute exacerbation.  ?Patient was placed on short course of systemic corticosteroids.  ?Treated with  inhaled steroids and bronchodilatory therapy.  ?Out of bed to chair and continue Pt and OT ? ?At the time of his discharge his systemic steroids have been discontinued.   ? ? ?A-fib ([place]) ?Patient developed atrial fibrillation with rapid ventricular response. ?He was placed on IV diltiazem and then transitioned to oral AV blockade with increased dose of diltiazem and metoprolol.  ? ?AV blockade was interrupted transitory when patient developed sepsis. Now he has been resumed on AV blockade with good toleration.  ? ?Continue anticoagulation with warfarin, dosing during his hospitalization per pharmacy protocol. ?At the time of his discharge his INR is 1.9  ? ?Acute kidney injury superimposed on chronic kidney disease ([place]) ?CKD stage 2, Hyponatremia. ? ?Patient tolerated well diuresis, his renal function was closely followed. ?At the time of his discharge his renal function has a serum cr of 1,18 with K at 4,7 and serum bicarbonate at 33.  ? ?At home patient will continue diuresis with 40 mg of furosemide daily and have a close follow up.  ? ?Essential hypertension ?Patient had episodic hypotension. ?At the time of his discharge his blood pressure is stable. He will continue with metoprolol and diltiazem.  ? ?Rheumatoid arthritis ([place]) ?No active flare.  ?Follow up as outpatient.  ? ?Peripheral polyneuropathy ?Stable, continue medical therapy with gabapenitn.  ? 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 ?Patient did received IV iron during his hospitalization. ?Plan to follow up iron panel as outpatient.  ?At the time of his discharge his hgb is 9.5 and plt 206   ? ? ?  Class 1 obesity ?Calculated BMI is 31.5 ? ? ? ? ? ?  ? ? ?Consultants: cardiology  ?Procedures performed: none  ?Disposition: Home ?Diet recommendation:  ?Cardiac diet ?DISCHARGE MEDICATION: ?Allergies as of 06/11/2021   ? ?   Reactions  ? Tape Other (See Comments)  ? Patient takes Coumadin!! Tape BRUISES and TEARS THE SKIN  ? Propoxyphene Nausea And Vomiting  ? ?  ? ?  ?Medication List  ?  ? ?STOP taking these medications   ? ?HYDROcodone-acetaminophen 5-325 MG  tablet ?Commonly known as: NORCO/VICODIN ?  ?magnesium oxide 400 MG tablet ?Commonly known as: MAG-OX ?  ?metoprolol succinate 50 MG 24 hr tablet ?Commonly known as: TOPROL-XL ?  ?predniSONE 10 MG tablet ?

## 2021-06-24 ENCOUNTER — Ambulatory Visit (INDEPENDENT_AMBULATORY_CARE_PROVIDER_SITE_OTHER): Payer: Medicare Other

## 2021-06-24 DIAGNOSIS — I4891 Unspecified atrial fibrillation: Secondary | ICD-10-CM | POA: Diagnosis not present

## 2021-06-24 DIAGNOSIS — Z5181 Encounter for therapeutic drug level monitoring: Secondary | ICD-10-CM | POA: Diagnosis not present

## 2021-06-24 DIAGNOSIS — Z7901 Long term (current) use of anticoagulants: Secondary | ICD-10-CM | POA: Diagnosis not present

## 2021-06-24 LAB — POCT INR: INR: 1.8 — AB (ref 2.0–3.0)

## 2021-06-24 NOTE — Patient Instructions (Signed)
-  TAKE EXTRA 0.5 TABLET TODAY ONLY ?-Continue taking warfarin 1.5 tablets daily except for 1 tablet on Tuesday, Thursday and Saturday. Recheck INR in 3 weeks. [facility] [phone] ?

## 2021-07-15 ENCOUNTER — Ambulatory Visit (INDEPENDENT_AMBULATORY_CARE_PROVIDER_SITE_OTHER): Payer: Medicare Other

## 2021-07-15 DIAGNOSIS — Z7901 Long term (current) use of anticoagulants: Secondary | ICD-10-CM | POA: Diagnosis not present

## 2021-07-15 DIAGNOSIS — I4891 Unspecified atrial fibrillation: Secondary | ICD-10-CM | POA: Diagnosis not present

## 2021-07-15 DIAGNOSIS — Z5181 Encounter for therapeutic drug level monitoring: Secondary | ICD-10-CM

## 2021-07-15 LAB — POCT INR: INR: 2 (ref 2.0–3.0)

## 2021-07-15 MED ORDER — WARFARIN SODIUM 5 MG PO TABS: ORAL_TABLET | ORAL | 1 refills | Status: DC

## 2021-07-15 NOTE — Patient Instructions (Signed)
-  Continue taking warfarin 1.5 tablets daily except for 1 tablet on Tuesday, Thursday and Saturday. Recheck INR in 6 weeks. [facility] [phone]

## 2021-08-26 ENCOUNTER — Ambulatory Visit (INDEPENDENT_AMBULATORY_CARE_PROVIDER_SITE_OTHER): Payer: Medicare Other | Admitting: *Deleted

## 2021-08-26 DIAGNOSIS — Z5181 Encounter for therapeutic drug level monitoring: Secondary | ICD-10-CM | POA: Diagnosis not present

## 2021-08-26 DIAGNOSIS — Z7901 Long term (current) use of anticoagulants: Secondary | ICD-10-CM

## 2021-08-26 DIAGNOSIS — I4891 Unspecified atrial fibrillation: Secondary | ICD-10-CM

## 2021-08-26 LAB — POCT INR: INR: 2.1 (ref 2.0–3.0)

## 2021-08-26 NOTE — Patient Instructions (Signed)
Description    -Continue taking warfarin 1.5 tablets daily except for 1 tablet on Tuesday, Thursday and Saturday. Recheck INR in 6 weeks. [facility] [phone]

## 2021-10-07 ENCOUNTER — Other Ambulatory Visit: Payer: Self-pay | Admitting: *Deleted

## 2021-10-07 ENCOUNTER — Ambulatory Visit (INDEPENDENT_AMBULATORY_CARE_PROVIDER_SITE_OTHER): Payer: Medicare Other | Admitting: *Deleted

## 2021-10-07 DIAGNOSIS — Z7901 Long term (current) use of anticoagulants: Secondary | ICD-10-CM | POA: Diagnosis not present

## 2021-10-07 DIAGNOSIS — Z5181 Encounter for therapeutic drug level monitoring: Secondary | ICD-10-CM

## 2021-10-07 DIAGNOSIS — I4891 Unspecified atrial fibrillation: Secondary | ICD-10-CM | POA: Diagnosis not present

## 2021-10-07 LAB — POCT INR: INR: 2.2 (ref 2.0–3.0)

## 2021-10-07 MED ORDER — WARFARIN SODIUM 5 MG PO TABS: ORAL_TABLET | ORAL | 0 refills | Status: AC

## 2021-10-07 NOTE — Patient Instructions (Signed)
Description    -Continue taking warfarin 1.5 tablets daily except for 1 tablet on Tuesday, Thursday and Saturday. Recheck INR in 6 weeks. [facility] [phone]

## 2021-10-07 NOTE — Telephone Encounter (Signed)
Prescription refill request received for warfarin [place]: 06/18/2020 Next INR check: 11/18/2021 Warfarin tablet strength: 5mg 

## 2021-10-24 ENCOUNTER — Emergency Department (HOSPITAL_BASED_OUTPATIENT_CLINIC_OR_DEPARTMENT_OTHER)
Admission: EM | Admit: 2021-10-24 | Discharge: 2021-10-24 | Disposition: A | Discharge status: Home / Self Care | Payer: Medicare Other | Attending: Emergency Medicine | Admitting: Emergency Medicine

## 2021-10-24 ENCOUNTER — Emergency Department (HOSPITAL_BASED_OUTPATIENT_CLINIC_OR_DEPARTMENT_OTHER): Payer: Medicare Other

## 2021-10-24 ENCOUNTER — Encounter (HOSPITAL_BASED_OUTPATIENT_CLINIC_OR_DEPARTMENT_OTHER): Payer: Self-pay | Admitting: Emergency Medicine

## 2021-10-24 ENCOUNTER — Other Ambulatory Visit: Payer: Self-pay

## 2021-10-24 DIAGNOSIS — I4891 Unspecified atrial fibrillation: Secondary | ICD-10-CM | POA: Insufficient documentation

## 2021-10-24 DIAGNOSIS — R109 Unspecified abdominal pain: Secondary | ICD-10-CM | POA: Diagnosis present

## 2021-10-24 DIAGNOSIS — M545 Low back pain, unspecified: Secondary | ICD-10-CM | POA: Diagnosis not present

## 2021-10-24 DIAGNOSIS — N189 Chronic kidney disease, unspecified: Secondary | ICD-10-CM | POA: Diagnosis not present

## 2021-10-24 DIAGNOSIS — I129 Hypertensive chronic kidney disease with stage 1 through stage 4 chronic kidney disease, or unspecified chronic kidney disease: Secondary | ICD-10-CM | POA: Insufficient documentation

## 2021-10-24 DIAGNOSIS — Z79899 Other long term (current) drug therapy: Secondary | ICD-10-CM | POA: Insufficient documentation

## 2021-10-24 DIAGNOSIS — Z7901 Long term (current) use of anticoagulants: Secondary | ICD-10-CM | POA: Diagnosis not present

## 2021-10-24 DIAGNOSIS — J449 Chronic obstructive pulmonary disease, unspecified: Secondary | ICD-10-CM | POA: Insufficient documentation

## 2021-10-24 DIAGNOSIS — I2510 Atherosclerotic heart disease of native coronary artery without angina pectoris: Secondary | ICD-10-CM | POA: Insufficient documentation

## 2021-10-24 DIAGNOSIS — K5900 Constipation, unspecified: Secondary | ICD-10-CM | POA: Diagnosis not present

## 2021-10-24 DIAGNOSIS — I714 Abdominal aortic aneurysm, without rupture, unspecified: Secondary | ICD-10-CM | POA: Insufficient documentation

## 2021-10-24 LAB — URINALYSIS, ROUTINE W REFLEX MICROSCOPIC
Bilirubin Urine: NEGATIVE
Glucose, UA: NEGATIVE mg/dL
Hgb urine dipstick: NEGATIVE
Ketones, ur: NEGATIVE mg/dL
Leukocytes,Ua: NEGATIVE
Nitrite: NEGATIVE
Protein, ur: NEGATIVE mg/dL
Specific Gravity, Urine: 1.01 (ref 1.005–1.030)
pH: 5 (ref 5.0–8.0)

## 2021-10-24 LAB — CBC WITH DIFFERENTIAL/PLATELET
Abs Immature Granulocytes: 0.07 10*3/uL (ref 0.00–0.07)
Basophils Absolute: 0 10*3/uL (ref 0.0–0.1)
Basophils Relative: 0 %
Eosinophils Absolute: 0 10*3/uL (ref 0.0–0.5)
Eosinophils Relative: 0 %
HCT: 42.2 % (ref 39.0–52.0)
Hemoglobin: 14.4 g/dL (ref 13.0–17.0)
Immature Granulocytes: 1 %
Lymphocytes Relative: 16 %
Lymphs Abs: 0.9 10*3/uL (ref 0.7–4.0)
MCH: 35.6 pg — ABNORMAL HIGH (ref 26.0–34.0)
MCHC: 34.1 g/dL (ref 30.0–36.0)
MCV: 104.2 fL — ABNORMAL HIGH (ref 80.0–100.0)
Monocytes Absolute: 0.9 10*3/uL (ref 0.1–1.0)
Monocytes Relative: 15 %
Neutro Abs: 3.8 10*3/uL (ref 1.7–7.7)
Neutrophils Relative %: 68 %
Platelets: 175 10*3/uL (ref 150–400)
RBC: 4.05 MIL/uL — ABNORMAL LOW (ref 4.22–5.81)
RDW: 14.9 % (ref 11.5–15.5)
WBC: 5.7 10*3/uL (ref 4.0–10.5)
nRBC: 0 % (ref 0.0–0.2)

## 2021-10-24 LAB — COMPREHENSIVE METABOLIC PANEL
ALT: 24 U/L (ref 0–44)
AST: 23 U/L (ref 15–41)
Albumin: 3.6 g/dL (ref 3.5–5.0)
Alkaline Phosphatase: 58 U/L (ref 38–126)
Anion gap: 9 (ref 5–15)
BUN: 32 mg/dL — ABNORMAL HIGH (ref 8–23)
CO2: 29 mmol/L (ref 22–32)
Calcium: 8.5 mg/dL — ABNORMAL LOW (ref 8.9–10.3)
Chloride: 99 mmol/L (ref 98–111)
Creatinine, Ser: 1.24 mg/dL (ref 0.61–1.24)
GFR, Estimated: 60 mL/min — ABNORMAL LOW (ref >60)
Glucose, Bld: 99 mg/dL (ref 70–99)
Potassium: 4 mmol/L (ref 3.5–5.1)
Sodium: 137 mmol/L (ref 135–145)
Total Bilirubin: 1.1 mg/dL (ref 0.3–1.2)
Total Protein: 6.9 g/dL (ref 6.5–8.1)

## 2021-10-24 LAB — PROTIME-INR
INR: 1.3 — ABNORMAL HIGH (ref 0.8–1.2)
Prothrombin Time: 16.4 seconds — ABNORMAL HIGH (ref 11.4–15.2)

## 2021-10-24 LAB — LIPASE, BLOOD: Lipase: 32 U/L (ref 11–51)

## 2021-10-24 MED ORDER — IOHEXOL 300 MG/ML  SOLN
100.0000 mL | Freq: Once | INTRAMUSCULAR | Status: AC | PRN
  Administered 2021-10-24: 100 mL via INTRAVENOUS

## 2021-10-24 MED ORDER — METHOCARBAMOL 500 MG PO TABS
500.0000 mg | ORAL_TABLET | Freq: Once | ORAL | Status: AC
  Administered 2021-10-24: 500 mg via ORAL
  Filled 2021-10-24: qty 1

## 2021-10-24 MED ORDER — OXYCODONE-ACETAMINOPHEN 5-325 MG PO TABS: 1.0000 | ORAL_TABLET | Freq: Four times a day (QID) | ORAL | 0 refills | Status: AC | PRN

## 2021-10-24 MED ORDER — LIDOCAINE 5 % EX PTCH
1.0000 | MEDICATED_PATCH | CUTANEOUS | Status: DC
  Administered 2021-10-24: 1 via TRANSDERMAL
  Filled 2021-10-24: qty 1

## 2021-10-24 MED ORDER — DICLOFENAC SODIUM 1 % EX GEL: 2.0000 g | Freq: Four times a day (QID) | CUTANEOUS | 0 refills | Status: AC

## 2021-10-24 MED ORDER — OXYCODONE-ACETAMINOPHEN 5-325 MG PO TABS
1.0000 | ORAL_TABLET | Freq: Once | ORAL | Status: AC
  Administered 2021-10-24: 1 via ORAL
  Filled 2021-10-24: qty 1

## 2021-10-24 NOTE — ED Provider Notes (Signed)
[facility] EMERGENCY DEPARTMENT Provider Note   CSN: [id number] Arrival date & time: 10/24/21  1342     History  Chief Complaint  Patient presents with   Flank Pain    Jake [name] is a 76 y.o. male.  With PMH of HTN, CAD, COPD, pulmonary hypertension, CKD, history of SBO, renal stones, A fib on Warfarin, spinal surgery presenting with atraumatic right flank pain and right lower back pain has been ongoing for the past 3 weeks worse with movement.  Patient is complaining of right lower back and right flank pain has been ongoing for the past 3 weeks.  He says he notices the most when he moves or starts walking.  He denies any radiation to his abdomen or down his leg.  He denies any recent injuries or falls.  He has not been taking anything for the pain.  He says it is best when he is relaxing and not moving in bed.  He has had no fevers, no vomiting, no diarrhea, no loss of control of bladder or bowels, no weakness in the lower extremities, no paresthesias or loss of sensation.  He also denies any urinary symptoms.   Flank Pain       Home Medications Prior to Admission medications   Medication Sig Start Date End Date Taking? Authorizing Provider  diclofenac Sodium (VOLTAREN) 1 % GEL Apply 2 g topically 4 (four) times daily. 10/24/21  Yes Elgie Congo, MD  oxyCODONE-acetaminophen (PERCOCET/ROXICET) 5-325 MG tablet Take 1 tablet by mouth every 6 (six) hours as needed for severe pain. 10/24/21  Yes Elgie Congo, MD  Cholecalciferol (VITAMIN D-3 PO) Take 1 capsule by mouth daily.    [provider]  cyanocobalamin 1000 MCG tablet Take 1,000 mcg by mouth daily.    [provider]  diltiazem (CARDIZEM CD) 240 MG 24 hr capsule Take 1 capsule (240 mg total) by mouth daily. 06/11/21 07/11/21  Arrien, Jimmy Picket, MD  folic acid (FOLVITE) 1 MG tablet Take 1 mg by mouth daily. 04/29/19   [provider]  furosemide (LASIX) 40 MG tablet Take 1 tablet  (40 mg total) by mouth daily. Start taking on 06/13/21 06/11/21 07/11/21  Arrien, Jimmy Picket, MD  gabapentin (NEURONTIN) 300 MG capsule Take 300 mg by mouth in the morning. Reported on 06/03/2015    [provider]  metoprolol tartrate (LOPRESSOR) 50 MG tablet Take 1 tablet (50 mg total) by mouth 2 (two) times daily. 06/10/21 07/10/21  Arrien, Jimmy Picket, MD  OXYGEN Inhale 3 L/min into the lungs as needed (for shortness of breath).    [provider]  spironolactone (ALDACTONE) 25 MG tablet Take 1 tablet (25 mg total) by mouth daily. 06/11/21 07/11/21  Arrien, Jimmy Picket, MD  sulfaSALAzine (AZULFIDINE) 500 MG tablet Take 500 mg by mouth 2 (two) times daily. 02/15/21   [provider]  TYLENOL 8 HOUR 650 MG CR tablet Take 650-1,300 mg by mouth every 8 (eight) hours as needed for pain.    [provider]  warfarin (COUMADIN) 5 MG tablet Take 1&1/2 to 2 tablets daily or as prescribed by Clinic 10/07/21   Elouise Munroe, MD      Allergies    Tape and Propoxyphene    Review of Systems   Review of Systems  Genitourinary:  Positive for flank pain.    Physical Exam Updated Vital Signs BP 117/78   Pulse (!) 101   Temp 97.7 F (36.5 C) (Oral)  Resp (!) 21   Ht 6\' 1"  (1.854 m)   Wt 93 kg   SpO2 95%   BMI 27.05 kg/m  Physical Exam Constitutional: Alert and oriented.  Chronically ill and uncomfortable but no acute distress Eyes: Conjunctivae are normal. ENT      Head: Normocephalic and atraumatic.      Nose: No congestion.      Mouth/Throat: Mucous membranes are moist.      Neck: No stridor. Cardiovascular: S1, S2, extremities are warm and well perfused Respiratory: Normal respiratory effort. Breath sounds are normal. Gastrointestinal: Soft and nontender. There is no CVA tenderness. Musculoskeletal: Normal range of motion in all extremities.  Tenderness over the right lumbar lateral paraspinal region with no external evidence of injury and  right flank region.  No midline spinal tenderness step-offs or deformities.  Well-healed old surgical spinal scar over the lumbar spine. Mild equal bilateral pedal edema nontender to the touch Neurologic: Normal speech and language.  Full strength of bilateral foot dorsiflexion and plantarflexion, knee flexion and extension and hip movement.  Sensation grossly intact of bilateral lower extremities.  Steady gait.  No gross focal neurologic deficits are appreciated. Skin: Skin is warm, dry .  No rash overlying area of pain. Psychiatric: Mood and affect are normal. Speech and behavior are normal.  ED Results / Procedures / Treatments   Labs (all labs ordered are listed, but only abnormal results are displayed) Labs Reviewed  COMPREHENSIVE METABOLIC PANEL - Abnormal; Notable for the following components:      Result Value   BUN 32 (*)    Calcium 8.5 (*)    GFR, Estimated 60 (*)    All other components within normal limits  CBC WITH DIFFERENTIAL/PLATELET - Abnormal; Notable for the following components:   RBC 4.05 (*)    MCV 104.2 (*)    MCH 35.6 (*)    All other components within normal limits  PROTIME-INR - Abnormal; Notable for the following components:   Prothrombin Time 16.4 (*)    INR 1.3 (*)    All other components within normal limits  URINALYSIS, ROUTINE W REFLEX MICROSCOPIC  LIPASE, BLOOD    EKG EKG Interpretation  Date/Time:  Monday October 24 2021 17:14:42 EDT Ventricular Rate:  113 PR Interval:    QRS Duration: 106 QT Interval:  336 QTC Calculation: 481 R Axis:   252 Text Interpretation: Atrial fibrillation Ventricular premature complex Consider right ventricular hypertrophy Inferior infarct, old No acute changes Confirmed by Georgina Snell [phone]) on 10/24/2021 5:22:01 PM  Radiology CT ABDOMEN PELVIS W CONTRAST  Result Date: 10/24/2021 CLINICAL DATA:  atraumatic right flank pain, h/o lumbar surgery, h/o stones EXAM: CT ABDOMEN AND PELVIS WITH CONTRAST TECHNIQUE:  Multidetector CT imaging of the abdomen and pelvis was performed using the standard protocol following bolus administration of intravenous contrast. RADIATION DOSE REDUCTION: This exam was performed according to the departmental dose-optimization program which includes automated exposure control, adjustment of the mA and/or kV according to patient size and/or use of iterative reconstruction technique. CONTRAST:  154mL OMNIPAQUE IOHEXOL 300 MG/ML  SOLN COMPARISON:  CT abdomen pelvis 03/05/2020, CT abdomen pelvis 07/22/2019 FINDINGS: Lower chest: No acute abnormality. Hepatobiliary: Couple subcentimeter hypodensities too small to characterize. Similar-appearing query exophytic isodense mass lesion along the inferior right hepatic lobe (5:46). No focal liver abnormality. No gallstones, gallbladder wall thickening, or pericholecystic fluid. No biliary dilatation. Pancreas: No focal lesion. Normal pancreatic contour. No surrounding inflammatory changes. No main pancreatic ductal dilatation. Normal in size  without focal abnormality. Spleen: Normal in size without focal abnormality. Adrenals/Urinary Tract: No adrenal nodule bilaterally. Bilateral kidneys enhance symmetrically. Fluid density lesion within left kidney likely represents simple renal cyst. Simple renal cysts, in the absence of clinically indicated signs/symptoms, require no independent follow-up. Calcifications associated with the kidneys likely vascular. No frank nephrolithiasis. No ureterolithiasis. No hydronephrosis. No hydroureter. The urinary bladder is unremarkable. On delayed imaging, there is no urothelial wall thickening and there are no filling defects in the opacified portions of the bilateral collecting systems or ureters. Stomach/Bowel: Sigmoid resection and small bowel resection within the right mid abdomen. Stomach is within normal limits. No evidence of bowel wall thickening or dilatation. The appendix is not definitely identified with no  inflammatory changes in the right lower quadrant to suggest acute appendicitis. Vascular/Lymphatic: Stable infrarenal abdominal aorta is enlarged in caliber measuring up to 3.2 cm. No iliac artery aneurysm. No suprarenal aorta abdominal aorta aneurysm. Severe atherosclerotic plaque of the aorta and its branches. No abdominal, pelvic, or inguinal lymphadenopathy. Reproductive: Prostate is unremarkable. Other: No intraperitoneal free fluid. No intraperitoneal free gas. No organized fluid collection. Musculoskeletal: No abdominal wall hernia or abnormality. No suspicious lytic or blastic osseous lesions. Chronic L1 compression fracture appears stable with a proximally 70% vertebral body height loss. No acute displaced fracture. IMPRESSION: 1. Mild constipation in a patient status post small bowel resection and sigmoid resection. 2. 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 Recommend follow-up ultrasound every 3 years. This recommendation follows ACR consensus guidelines: White Paper of the ACR Incidental Findings Committee II on Vascular Findings. J Am Coll Radiol 2013; 27:782-423. 3.  Aortic Atherosclerosis (ICD10-I70.0). Electronically Signed   By: Iven Finn M.D.   On: 10/24/2021 18:21    Procedures Procedures  Remain on constant cardiac monitoring, atrial fibrillation  Medications Ordered in ED Medications  lidocaine (LIDODERM) 5 % 1 patch (1 patch Transdermal Patch Applied 10/24/21 1714)  oxyCODONE-acetaminophen (PERCOCET/ROXICET) 5-325 MG per tablet 1 tablet (1 tablet Oral Given 10/24/21 1712)  methocarbamol (ROBAXIN) tablet 500 mg (500 mg Oral Given 10/24/21 1712)  iohexol (OMNIPAQUE) 300 MG/ML solution 100 mL (100 mLs Intravenous Contrast Given 10/24/21 1745)    ED Course/ Medical Decision Making/ A&P                           Medical Decision Making Jake [name] is a 76 y.o. male.  With PMH of HTN, CAD, COPD, pulmonary hypertension, CKD, history of SBO, renal stones, A fib on  Warfarin, spinal surgery presenting with atraumatic right flank pain and right lower back pain has been ongoing for the past 3 weeks worse with movement.  Based on patient's history and physical, suspect most likely consistent with nonspecific musculoskeletal lower back pain.  No recent injuries or falls and no midline back pain, unlikely fracture but will further evaluate with CT scan.  Also denying any history of urinary symptoms and no abdominal pain on exam but CT scan will further evaluate for intra-abdominal pathology such as renal stones contributing to symptoms.  No concern for cauda equina with full strength of bilateral lower extremities and no red flag signs or symptoms suggestive.  Unlikely cardiac etiology based off location of pain and EKG obtained unchanged and similar to previous.  No acute ischemic change.  UA was obtained and showed showed no evidence of UTI  His labs were obtained and generally unremarkable.  Creatinine 1.24 consistent with baseline.  No transaminitis normal  lipase.  Normal white blood cell count 5.7.  Normal hemoglobin 14.4.  INR subtherapeutic 1.3.  CT scan of the abdomen and pelvis showed no acute findings within the abdomen to explain his pain.  He has a stable infrarenal aortic aneurysm which I discussed with patient to follow-up with PCP.  He also had evidence of constipation..  Patient was given Percocet, Robaxin and Lidoderm patch for pain control.  He did have some improvement with pain and I advised the importance of following up with primary care physician for PT referral and continuing gentle movement and exercises.  Advised continued Tylenol, Voltaren gel which was prescribed and as needed Percocet with strict return precautions.  He is safe for discharge home at this time.  Amount and/or Complexity of Data Reviewed Labs: ordered. Radiology: ordered.  Risk Prescription drug management.    Final Clinical Impression(s) / ED Diagnoses Final  diagnoses:  Right-sided low back pain without sciatica, unspecified chronicity  Abdominal aortic aneurysm (AAA) without rupture, unspecified part ([place])  Constipation, unspecified constipation type    Rx / DC Orders ED Discharge Orders          Ordered    diclofenac Sodium (VOLTAREN) 1 % GEL  4 times daily        10/24/21 2005    oxyCODONE-acetaminophen (PERCOCET/ROXICET) 5-325 MG tablet  Every 6 hours PRN        10/24/21 2005              Elgie Congo, MD 10/24/21 2009

## 2021-10-24 NOTE — ED Triage Notes (Signed)
Patient c/o right flank pain for past couple of weeks. Patient has to use cane to ambulate. Patient denies any urinary symptoms. Patient has not been evaluated for this current situation.

## 2021-10-24 NOTE — Discharge Instructions (Addendum)
You are seen in the emergency department today for back pain.  We suspect your pain is musculoskeletal in nature.  Take Tylenol 650 mg every 6-8 hours as needed for pain.  You can also apply the Voltaren gel we have prescribed you to the area that is causing you pain.  If pain is still severe and uncontrolled, take a Percocet as needed for pain but do not take while drinking alcohol or driving.  Make sure you are still moving around and doing gentle exercises.  Make sure you follow-up with your primary care physician for referral to physical therapy.  Your urine did not show any signs of urinary tract infection.  Your CT scan showed no kidney stone or findings in your abdomen that would be causing your pain.  It did show evidence of a aortic aneurysm that has been present on previous scans and stable and unchanged.  Make sure you follow-up with your primary care physician to get scheduled repeat ultrasounds every 3 years to follow the size of it.  Your scan also showed you are quite constipated.  Take MiraLAX 2 times daily to make sure you are having normal bowel movements.  Come back if you have any severe worsening pain, high fevers with pain, inability to urinate or have bowel movements with pain, new weakness in your legs, or any other symptoms concerning to you.  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 Recommend  follow-up ultrasound every 3 years. This recommendation follows ACR  consensus guidelines: White Paper of the ACR Incidental Findings  Committee II on Vascular Findings. J Am Coll Radiol 2013;

## 2021-11-17 ENCOUNTER — Ambulatory Visit: Payer: Medicare Other | Attending: Internal Medicine | Admitting: Internal Medicine

## 2021-11-17 ENCOUNTER — Encounter: Payer: Self-pay | Admitting: Internal Medicine

## 2021-11-17 VITALS — BP 103/67 | HR 77 | Ht 73.0 in | Wt 209.0 lb

## 2021-11-17 DIAGNOSIS — D6869 Other thrombophilia: Secondary | ICD-10-CM | POA: Diagnosis not present

## 2021-11-17 DIAGNOSIS — I4891 Unspecified atrial fibrillation: Secondary | ICD-10-CM | POA: Insufficient documentation

## 2021-11-17 DIAGNOSIS — J9611 Chronic respiratory failure with hypoxia: Secondary | ICD-10-CM | POA: Insufficient documentation

## 2021-11-17 DIAGNOSIS — I4819 Other persistent atrial fibrillation: Secondary | ICD-10-CM | POA: Insufficient documentation

## 2021-11-17 DIAGNOSIS — I2781 Cor pulmonale (chronic): Secondary | ICD-10-CM | POA: Insufficient documentation

## 2021-11-17 DIAGNOSIS — K56609 Unspecified intestinal obstruction, unspecified as to partial versus complete obstruction: Secondary | ICD-10-CM | POA: Insufficient documentation

## 2021-11-17 DIAGNOSIS — M7989 Other specified soft tissue disorders: Secondary | ICD-10-CM | POA: Insufficient documentation

## 2021-11-17 DIAGNOSIS — Z79899 Other long term (current) drug therapy: Secondary | ICD-10-CM | POA: Diagnosis not present

## 2021-11-17 DIAGNOSIS — I10 Essential (primary) hypertension: Secondary | ICD-10-CM | POA: Diagnosis present

## 2021-11-17 DIAGNOSIS — Z7901 Long term (current) use of anticoagulants: Secondary | ICD-10-CM | POA: Insufficient documentation

## 2021-11-17 DIAGNOSIS — I5032 Chronic diastolic (congestive) heart failure: Secondary | ICD-10-CM | POA: Diagnosis present

## 2021-11-17 MED ORDER — FUROSEMIDE 20 MG PO TABS: 40.0000 mg | ORAL_TABLET | Freq: Every day | ORAL | 5 refills | Status: AC

## 2021-11-17 NOTE — Progress Notes (Signed)
Cardiology Office Note:    Date:  11/17/2021  ID:  Jake [name], DOB [birth year], MRN [id number]  PCP:  Jake Axe, MD  Cardiologist:  Jake Munroe, MD  Electrophysiologist:  None   Referring MD: Jake Axe, MD   Chief Complaint/Reason for Referral: Afib  History of Present Illness:    Jake [name] is a 76 y.o. male with a history of hypertension, rheumatoid arthritis, AAA without rupture, hepatitis C, COPD, who I initially met while hospitalized for small bowel obstruction with failed conservative management and subsequent surgical intervention. Hospitalization complicated by atrial fibrillation with rapid ventricular response. Started on diltiazem 120 mg daily. Due to cost DOAC deferred, and he was anticoagulated with Lovenox bridged to Coumadin. Due to hypotension hydrochlorothiazide discontinued.   Previously he had bilateral ankle and foot swelling, we discussed as needed Lasix use. At his last appointment he was using the Lasix appropriately. He remained in atrial fibrillation which I suspected to now be permanent or at least persistent.  Fortunately he was asymptomatic in atrial fibrillation. He continued on warfarin for anticoagulation (deferred DOAC due to cost) and is followed closely by Coumadin clinic.  Most recent INR 2.7, on review has had some supratherapeutic values this year highest 3.7.  Since his last appointment he has had multiple hospital admissions for issues including acute gastroenteropathy, acute heart failure, atrial fibrillation, COPD exacerbation. Most recently he was seen in the ED for right-sided lower back pain. Unlikely cardiac etiology based off location of pain and EKG obtained unchanged and similar to previous.  No acute ischemic change. His labs were generally unremarkable and he was discharged with advised continued use of Tylenol, prn Percocet, and Voltaren gel.  Today, he is accompanied by his granddaughter. He reports having bilateral LE edema  daily. Just about every morning he notices swelling in his ankles, which usually gets worse around night time. He is currently taking 20 mg of Lasix but has requested a stronger dose. He is open to taking 40 mg on the days where his edema is worse and 20 mg daily otherwise.  He is wheezing quite a bit in clinic today. He has an inhaler for wheezing that he uses as needed. He leaves it on his night stand since he usually has to take it around nighttime. Also he complains of shortness of breath whenever he gets up and walks. He will use his oxygen when he needs it.   Every once in a while he gets lightheaded but he does not feel limited by this and is able to keep going. He denies any recent syncopal episodes or low blood pressures.   He is still feeling soreness in his lower back.  He has been compliant with taking his metoprolol and diltiazem as prescribed.   He denies any palpitations, chest pain. No headaches, syncope, orthopnea, or PND.    Past Medical History:  Diagnosis Date   Anemia    Asthma    Chronic diastolic (congestive) heart failure (HCC)    Chronic respiratory failure (HCC)    CKD (chronic kidney disease) stage 2, GFR 60-89 ml/min    COPD (chronic obstructive pulmonary disease) (HCC)    Cor pulmonale (HCC)    Coronary artery calcification    Coronary artery disease    Hepatitis A    Hypertension    Lung cancer (HCC)    OSA (obstructive sleep apnea)    Permanent atrial fibrillation ([place])    Pulmonary hypertension (HCC)    Rheumatoid arthritis ([place])  Thrombocytopenia ([place])     Past Surgical History:  Procedure Laterality Date   BACK SURGERY     FRACTURE SURGERY     JOINT REPLACEMENT     LAPAROTOMY N/A 07/25/2019   Procedure: EXPLORATORY LAPAROTOMY,REPAIR AND ANASTOMOSIS OF SMALL BOWEL, ENTEROLYSIS;  Surgeon: Jake Hausen, MD;  Location: [facility];  Service: General;  Laterality: N/A;    Current Medications: Current Meds  Medication Sig   Cholecalciferol  (VITAMIN D-3 PO) Take 1 capsule by mouth daily.   cyanocobalamin 1000 MCG tablet Take 1,000 mcg by mouth daily.   diclofenac Sodium (VOLTAREN) 1 % GEL Apply 2 g topically 4 (four) times daily.   folic acid (FOLVITE) 1 MG tablet Take 1 mg by mouth daily.   gabapentin (NEURONTIN) 300 MG capsule Take 300 mg by mouth in the morning. Reported on 06/03/2015   oxyCODONE-acetaminophen (PERCOCET/ROXICET) 5-325 MG tablet Take 1 tablet by mouth every 6 (six) hours as needed for severe pain.   OXYGEN Inhale 3 L/min into the lungs as needed (for shortness of breath).   sulfaSALAzine (AZULFIDINE) 500 MG tablet Take 500 mg by mouth 2 (two) times daily.   TYLENOL 8 HOUR 650 MG CR tablet Take 650-1,300 mg by mouth every 8 (eight) hours as needed for pain.   warfarin (COUMADIN) 5 MG tablet Take 1&1/2 to 2 tablets daily or as prescribed by Clinic     Allergies:   Tape and Propoxyphene   Social History   Tobacco Use   Smoking status: Every Day    Packs/day: 0.50    Years: 60.00    Total pack years: 30.00    Types: Cigarettes   Smokeless tobacco: Never   Tobacco comments:    1/2 pack a day    smoking for  60 years    Smoking cessation  Vaping Use   Vaping Use: Never used  Substance Use Topics   Alcohol use: Yes    Comment: at cookouts   Drug use: No     Family History: The patient's Family history is unknown by patient.  ROS:   Please see the history of present illness.    (+) Shortness of breath (+) Wheezing (+) Bilateral LE ankle edema (+) Lightheadedness (+) Lower back pain All other systems reviewed and are negative.  EKGs/Labs/Other Studies Reviewed:    The following studies were reviewed today:  Chest CT 10/05/2021 ([place]): IMPRESSION:  1. Enlarged right hilar lymph node which is increased size when  compared with prior exam, concerning for progressive disease.  2. Aortic Atherosclerosis (ICD10-I70.0) and Emphysema (ICD10-J43.9).   Chest CT  06/09/2021: IMPRESSION: Interval development of extensive patchy airspace opacities throughout both lower lobes most consistent with pneumonia. Minimal right pleural effusion is noted.   Coronary artery calcifications are noted suggesting coronary artery disease.   Aortic Atherosclerosis (ICD10-I70.0) and Emphysema (ICD10-J43.9).  Echo 05/26/2021: Sonographer Comments: Technically challenging study due to limited  acoustic windows, Technically difficult study due to poor echo windows,  suboptimal parasternal window, suboptimal apical window, suboptimal  subcostal window and patient is morbidly obese.   IMPRESSIONS   1. Left ventricular ejection fraction, by estimation, is 60 to 65%. The  left ventricle has normal function. The left ventricle has no regional  wall motion abnormalities. There is moderate left ventricular hypertrophy.  Left ventricular diastolic function   could not be evaluated.   2. Right ventricular systolic function is low normal. The right  ventricular size is moderately enlarged. There is moderately elevated  pulmonary artery systolic pressure. The estimated right ventricular  systolic pressure is 27.0 mmHg.   3. Left atrial size was mildly dilated.   4. Right atrial size was severely dilated.   5. The mitral valve is abnormal. Trivial mitral valve regurgitation.   6. The aortic valve was not well visualized. Aortic valve regurgitation  is not visualized. No aortic stenosis is present.   7. Aortic dilatation noted. There is borderline dilatation of the aortic  root, measuring 39 mm.   8. The inferior vena cava is dilated in size with <50% respiratory  variability, suggesting right atrial pressure of 15 mmHg.   Comparison(s): Changes from prior study are noted. 07/23/2019: LVEF 55-60%,  RVSP 36 mmHg.   EKG:  EKG is personally reviewed. 11/17/2021: EKG was not ordered.  06/18/2020: Afib, PVCs, septal infarct pattern.    Recent Labs: 05/26/2021: TSH  0.882 05/30/2021: B Natriuretic Peptide 570.3 06/09/2021: Magnesium 2.2 10/24/2021: ALT 24; BUN 32; Creatinine, Ser 1.24; Hemoglobin 14.4; Platelets 175; Potassium 4.0; Sodium 137  Recent Lipid Panel    Component Value Date/Time   TRIG 107 07/29/2019 0304    Physical Exam:    VS:  BP 103/67 (BP Location: Right Arm, Patient Position: Sitting, Cuff Size: Normal)   Pulse 77   Ht _0  (1.854 m)   Wt 209 lb (94.8 kg)   BMI 27.57 kg/m     Wt Readings from Last 5 Encounters:  11/17/21 209 lb (94.8 kg)  10/24/21 205 lb (93 kg)  06/11/21 217 lb 13 oz (98.8 kg)  08/01/20 215 lb (97.5 kg)  07/05/20 214 lb (97.1 kg)    Constitutional: No acute distress Eyes: sclera non-icteric, normal conjunctiva and lids ENMT: normal dentition, moist mucous membranes Cardiovascular: Irregular rhythm, normal rate, no murmurs. S1 and S2 normal. No jugular venous distention.  Respiratory: Wheezing bilaterally, end expiratory GI : normal bowel sounds, soft and nontender. No distention.   MSK: extremities warm, well perfused. Trace bilateral edema.  NEURO: grossly nonfocal exam, moves all extremities. PSYCH: alert and oriented x 3, normal mood and affect.   ASSESSMENT:    1. Persistent atrial fibrillation ([place])   2. Medication management   3. Atrial fibrillation with rapid ventricular response ([place])   4. Secondary hypercoagulable state ([place])   5. Long term (current) use of anticoagulants   6. Essential hypertension   7. Chronic diastolic (congestive) heart failure (HCC)   8. Leg swelling   9. SBO (small bowel obstruction) ([place])   10. Cor pulmonale, chronic ([place])   11. Chronic respiratory failure with hypoxia (HCC)     PLAN:    Chronic respiratory failure Chronic diastolic (congestive) heart failure (HCC) -Mild dependent edema, class I-II symptoms.  Continue as needed Lasix with increased dosing for symptoms as noted below. - multifactorial dyspnea in setting of COPD, CHF and untreated OSA with  PHTN and O2 requirement. Dyspnea overall stable however significant wheeze on exam. Recommend following with his PCP or pulmonary to optimize to avoid further worsening of right heart dysfunction.   Persistent atrial fibrillation (HCC) Secondary hypercoagulable state ([place]) Long term (current) use of anticoagulants -Continues on anticoagulation with warfarin for a CHA2DS2-VASc score of 3 for hypertension and age.  No bleeding events, tolerating well. - Rate control with diltiazem 120 mg daily, continue  Essential hypertension -  -Blood pressure is well controlled on diltiazem, continue  Leg swelling -Increase Lasix to 40 mg daily, can take 2 tablets if LE edema is increased.  SBO (  small bowel obstruction) (HCC)-stable symptoms per his report.  Follow-up in 6 months.  Total time of encounter: 30 minutes total time of encounter, including 20 minutes spent in face-to-face patient care on the date of this encounter. This time includes coordination of care and counseling regarding above mentioned problem list. Remainder of non-face-to-face time involved reviewing chart documents/testing relevant to the patient encounter and documentation in the medical record. I have independently reviewed documentation from referring provider.   Cherlynn Kaiser, MD, [facility]    Medication Adjustments/Labs and Tests Ordered: Current medicines are reviewed at length with the patient today.  Concerns regarding medicines are outlined above.   Orders Placed This Encounter  Procedures   Lipid panel   Basic metabolic panel   Meds ordered this encounter  Medications   furosemide (LASIX) 20 MG tablet    Sig: Take 2 tablets (40 mg total) by mouth daily. MAY TAKE 70m (2) TABLETS ONCE DAILY FOR INCREASED SWELLING- IF NO INCREASED SWELLING PLEASE TAKE 256mONCE DAILY    Dispense:  60 tablet    Refill:  5   Patient Instructions  Medication Instructions:  PLEASE TAKE LASIX 2071mNCE  DAILY, MAY TAKE (2) TABLETS for A TOTAL OF 32m44mILY FOR INCREASED SWELLING  *If you need a refill on your cardiac medications before your next appointment, please call your pharmacy*  Lab Work: Please return for FASTING Blood Work in [place] 11/28/21) . No appointment needed, lab here at the office is open Monday-Friday from 8AM to 4PM and closed daily for lunch from 12:45-1:45.   If you have labs (blood work) drawn today and your tests are completely normal, you will receive your results only by: [place] you have MyChart) OR A paper copy in the mail If you have any lab test that is abnormal or we need to change your treatment, we will call you to review the results.  Testing/Procedures: None Ordered At This Time.   Follow-Up: [facility] and your [facility] needs are our priority.  As part of our continuing mission to provide you with exceptional heart care, we have created designated Provider Care Teams.  These Care Teams include your primary Cardiologist (physician) and Advanced Practice Providers (APPs -  Physician Assistants and Nurse Practitioners) who all work together to provide you with the care you need, when you need it.  We recommend signing up for the patient portal called "MyChart".  Sign up information is provided on this After Visit Summary.  MyChart is used to connect with patients for Virtual Visits (Telemedicine).  Patients are able to view lab/test results, encounter notes, upcoming appointments, etc.  Non-urgent messages can be sent to your provider as well.   To learn more about what you can do with MyChart, go to [email or web address] Your next appointment:   6 month(s)  The format for your next appointment:   In Person  Provider:   GayaElouise [name]             I,[facility],acting as a scribe for GayaElouise [name].,have documented all relevant documentation on the behalf of GayaElouise [name],as  directed by  GayaElouise [name] while in the presence of GayaElouise [name].   I, GayaElouise [name], have reviewed all documentation for the visit on 11/17/2021. The documentation on today's date of service for the exam, diagnosis, procedures, and orders are  all accurate and complete.

## 2021-11-17 NOTE — Patient Instructions (Signed)
Medication Instructions:  PLEASE TAKE LASIX 20mg  ONCE DAILY, MAY TAKE (2) TABLETS for A TOTAL OF 40mg  DAILY FOR INCREASED SWELLING  *If you need a refill on your cardiac medications before your next appointment, please call your pharmacy*  Lab Work: Please return for FASTING Blood Work in [place] (ON OR AROUND 11/28/21) . No appointment needed, lab here at the office is open Monday-Friday from 8AM to 4PM and closed daily for lunch from 12:45-1:45.   If you have labs (blood work) drawn today and your tests are completely normal, you will receive your results only by: [place] (if you have MyChart) OR A paper copy in the mail If you have any lab test that is abnormal or we need to change your treatment, we will call you to review the results.  Testing/Procedures: None Ordered At This Time.   Follow-Up: At [facility], you and your health needs are our priority.  As part of our continuing mission to provide you with exceptional heart care, we have created designated Provider Care Teams.  These Care Teams include your primary Cardiologist (physician) and Advanced Practice Providers (APPs -  Physician Assistants and Nurse Practitioners) who all work together to provide you with the care you need, when you need it.  We recommend signing up for the patient portal called "MyChart".  Sign up information is provided on this After Visit Summary.  MyChart is used to connect with patients for Virtual Visits (Telemedicine).  Patients are able to view lab/test results, encounter notes, upcoming appointments, etc.  Non-urgent messages can be sent to your provider as well.   To learn more about what you can do with MyChart, go to [email or web address].    Your next appointment:   6 month(s)  The format for your next appointment:   In Person  Provider:   Elouise Munroe, MD

## 2021-11-18 ENCOUNTER — Ambulatory Visit (INDEPENDENT_AMBULATORY_CARE_PROVIDER_SITE_OTHER): Payer: Medicare Other | Admitting: *Deleted

## 2021-11-18 DIAGNOSIS — I4891 Unspecified atrial fibrillation: Secondary | ICD-10-CM | POA: Diagnosis not present

## 2021-11-18 DIAGNOSIS — Z7901 Long term (current) use of anticoagulants: Secondary | ICD-10-CM | POA: Diagnosis not present

## 2021-11-18 LAB — POCT INR: INR: 1.7 — AB (ref 2.0–3.0)

## 2021-11-18 NOTE — Patient Instructions (Signed)
Description   Today take 2 tablets then continue taking warfarin 1.5 tablets daily except for 1 tablet on Tuesday, Thursday and Saturday. Recheck INR in 5 weeks. Coumadin Clinic [phone]

## 2021-12-03 LAB — LIPID PANEL
Chol/HDL Ratio: 6.6 ratio — ABNORMAL HIGH (ref 0.0–5.0)
Cholesterol, Total: 158 mg/dL (ref 100–199)
HDL: 24 mg/dL — ABNORMAL LOW (ref >39)
LDL Chol Calc (NIH): 117 mg/dL — ABNORMAL HIGH (ref 0–99)
Triglycerides: 91 mg/dL (ref 0–149)
VLDL Cholesterol Cal: 17 mg/dL (ref 5–40)

## 2021-12-03 LAB — BASIC METABOLIC PANEL
BUN/Creatinine Ratio: 30 — ABNORMAL HIGH (ref 10–24)
BUN: 40 mg/dL — ABNORMAL HIGH (ref 8–27)
CO2: 29 mmol/L (ref 20–29)
Calcium: 8.8 mg/dL (ref 8.6–10.2)
Chloride: 99 mmol/L (ref 96–106)
Creatinine, Ser: 1.33 mg/dL — ABNORMAL HIGH (ref 0.76–1.27)
Glucose: 109 mg/dL — ABNORMAL HIGH (ref 70–99)
Potassium: 5.2 mmol/L (ref 3.5–5.2)
Sodium: 140 mmol/L (ref 134–144)
eGFR: 55 mL/min/{1.73_m2} — ABNORMAL LOW (ref >59)

## 2022-03-23 DEATH — deceased

## 2022-05-10 ENCOUNTER — Ambulatory Visit: Payer: Medicare Other | Admitting: Internal Medicine

## 2023-11-01 IMAGING — DX DG CHEST 2V
2 series · 2 of 2 positions shown · non-contrast
Comparison: Prior chest radiographs 07/14/2021 and earlier. CT
angiogram chest 07/07/2018

CLINICAL DATA: Provided history: Rule out fluid overload.

EXAM:
CHEST - 2 VIEW

[chest lat]
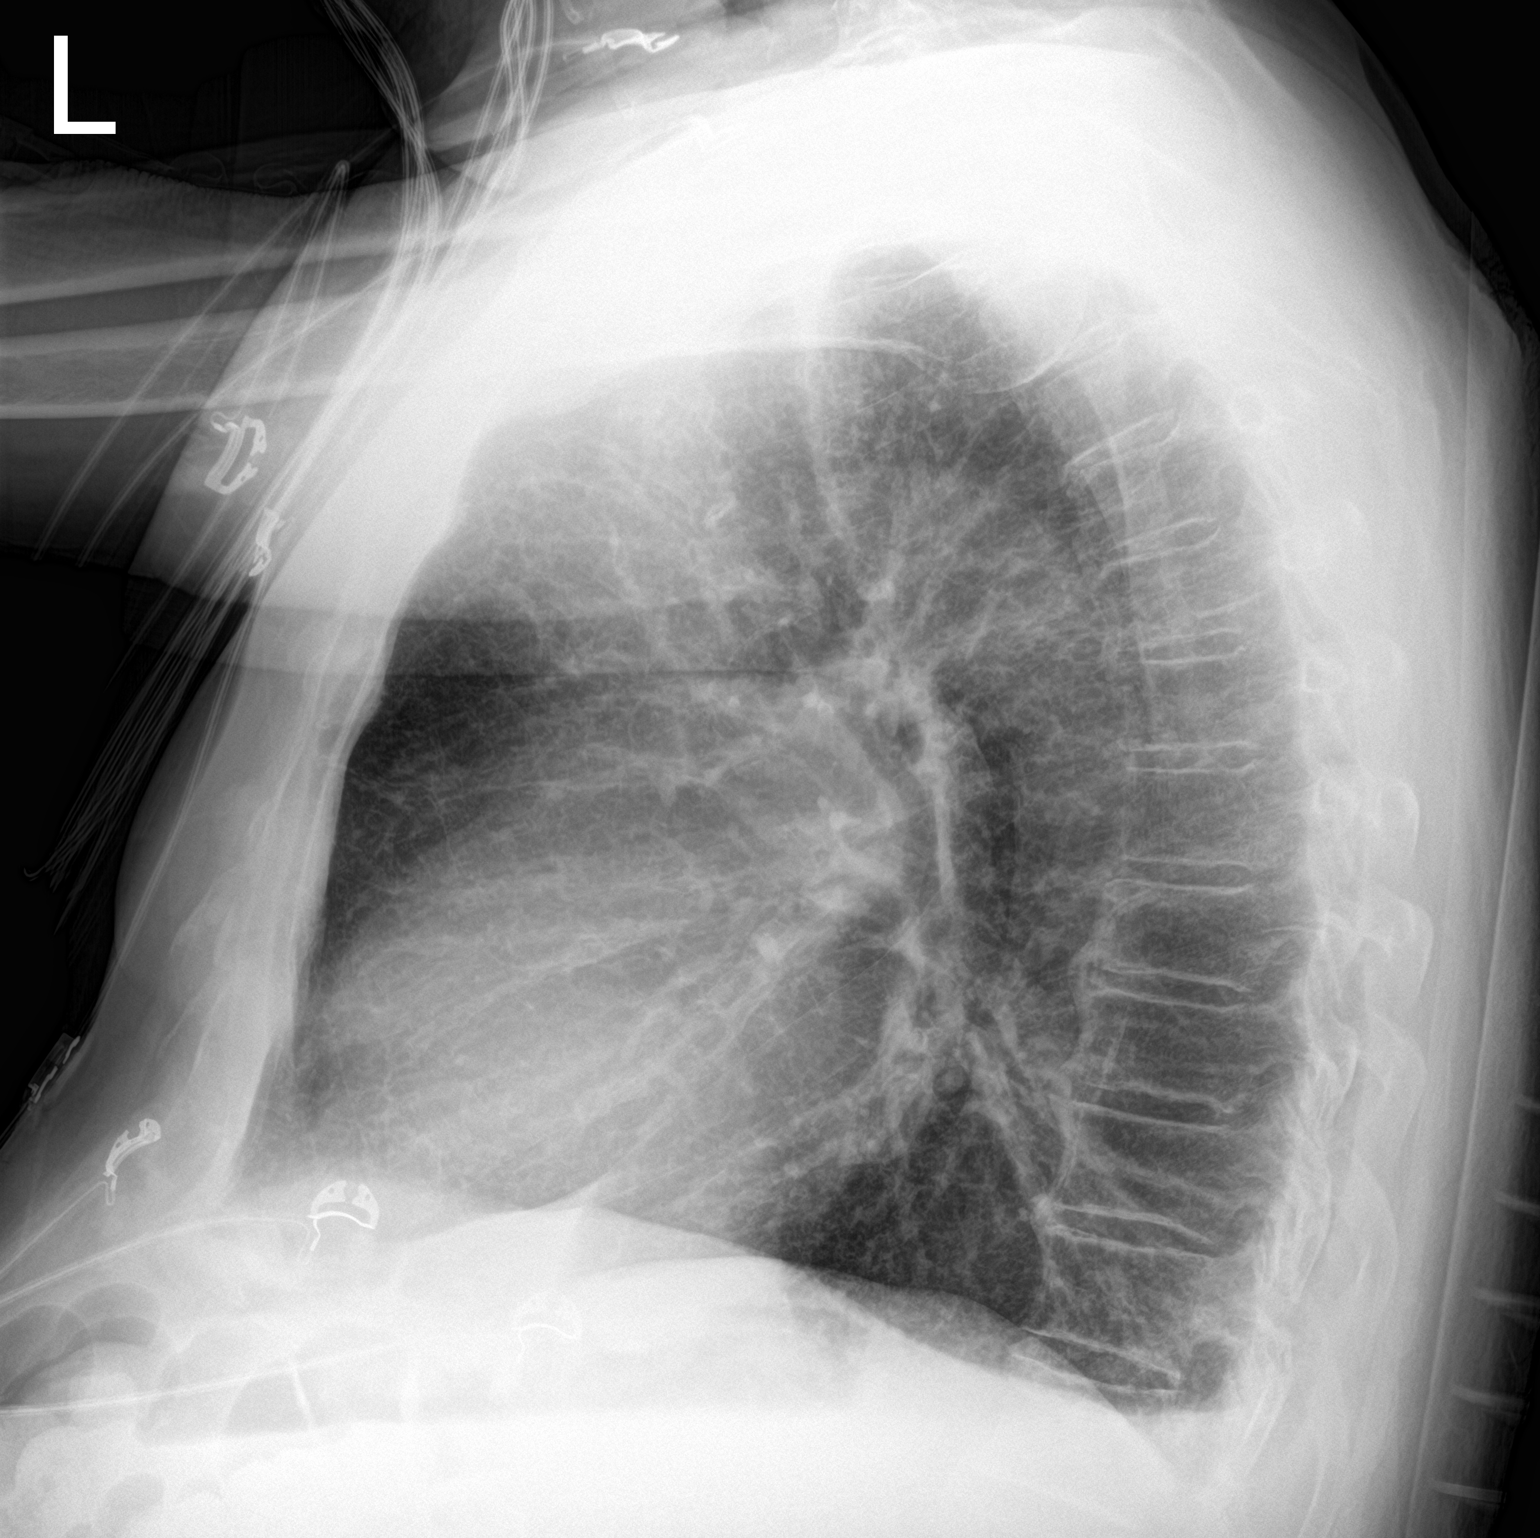

[chest ap strecther]
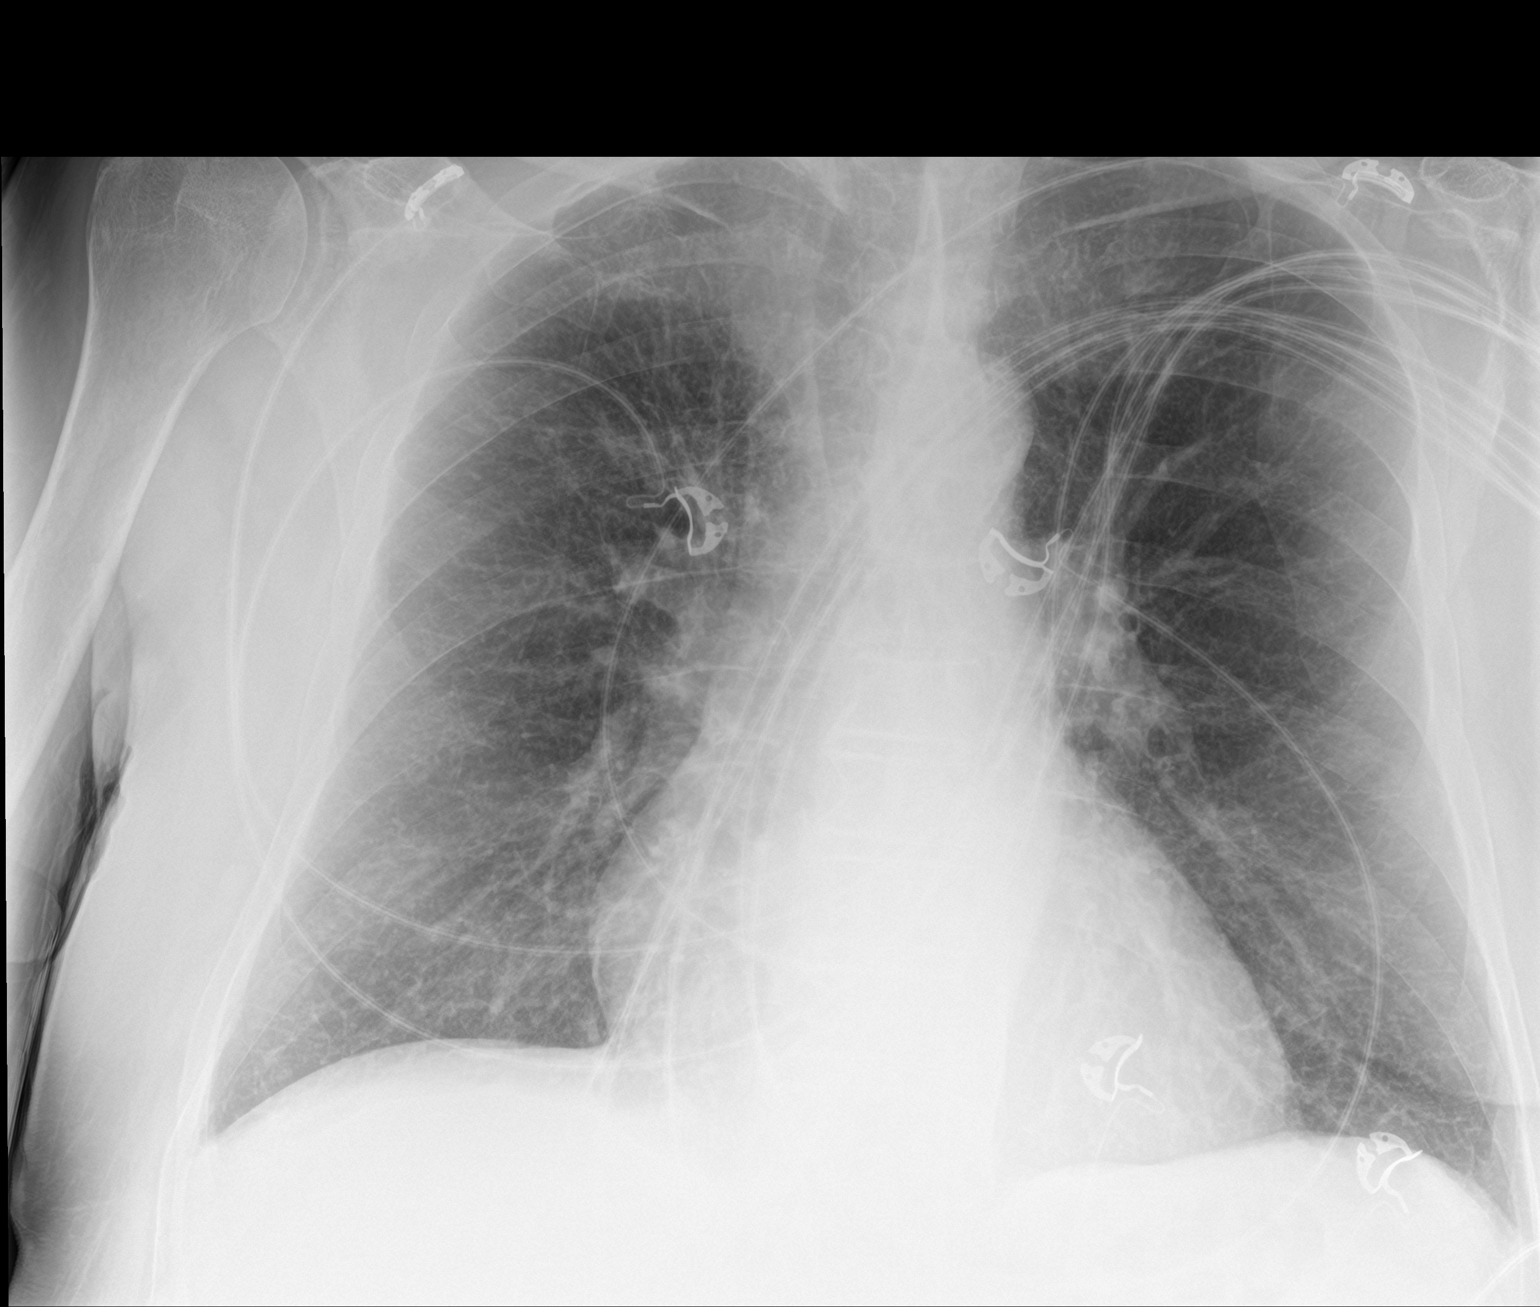

[2 of 2 positions shown; findings below may reference images not displayed]

FINDINGS: Heart size at the upper limits of normal. Aortic atherosclerosis. No
appreciable airspace consolidation or pulmonary edema. Known
emphysema better appreciated on the prior chest CT of 05/06/2021. No
sizable pleural effusion or evidence of pneumothorax. No acute bony
abnormality identified. Degenerative changes of the spine.
IMPRESSION: No evidence of acute cardiopulmonary abnormality.

Aortic Atherosclerosis (VXQQR-SBD.D) and Emphysema (VXQQR-5NN.V).

## 2023-11-04 IMAGING — DX DG CHEST 1V PORT
1 series · 1 of 1 positions shown · non-contrast
Comparison: 05/25/2021

CLINICAL DATA: Shortness of breath

EXAM:
PORTABLE CHEST 1 VIEW

[chest]
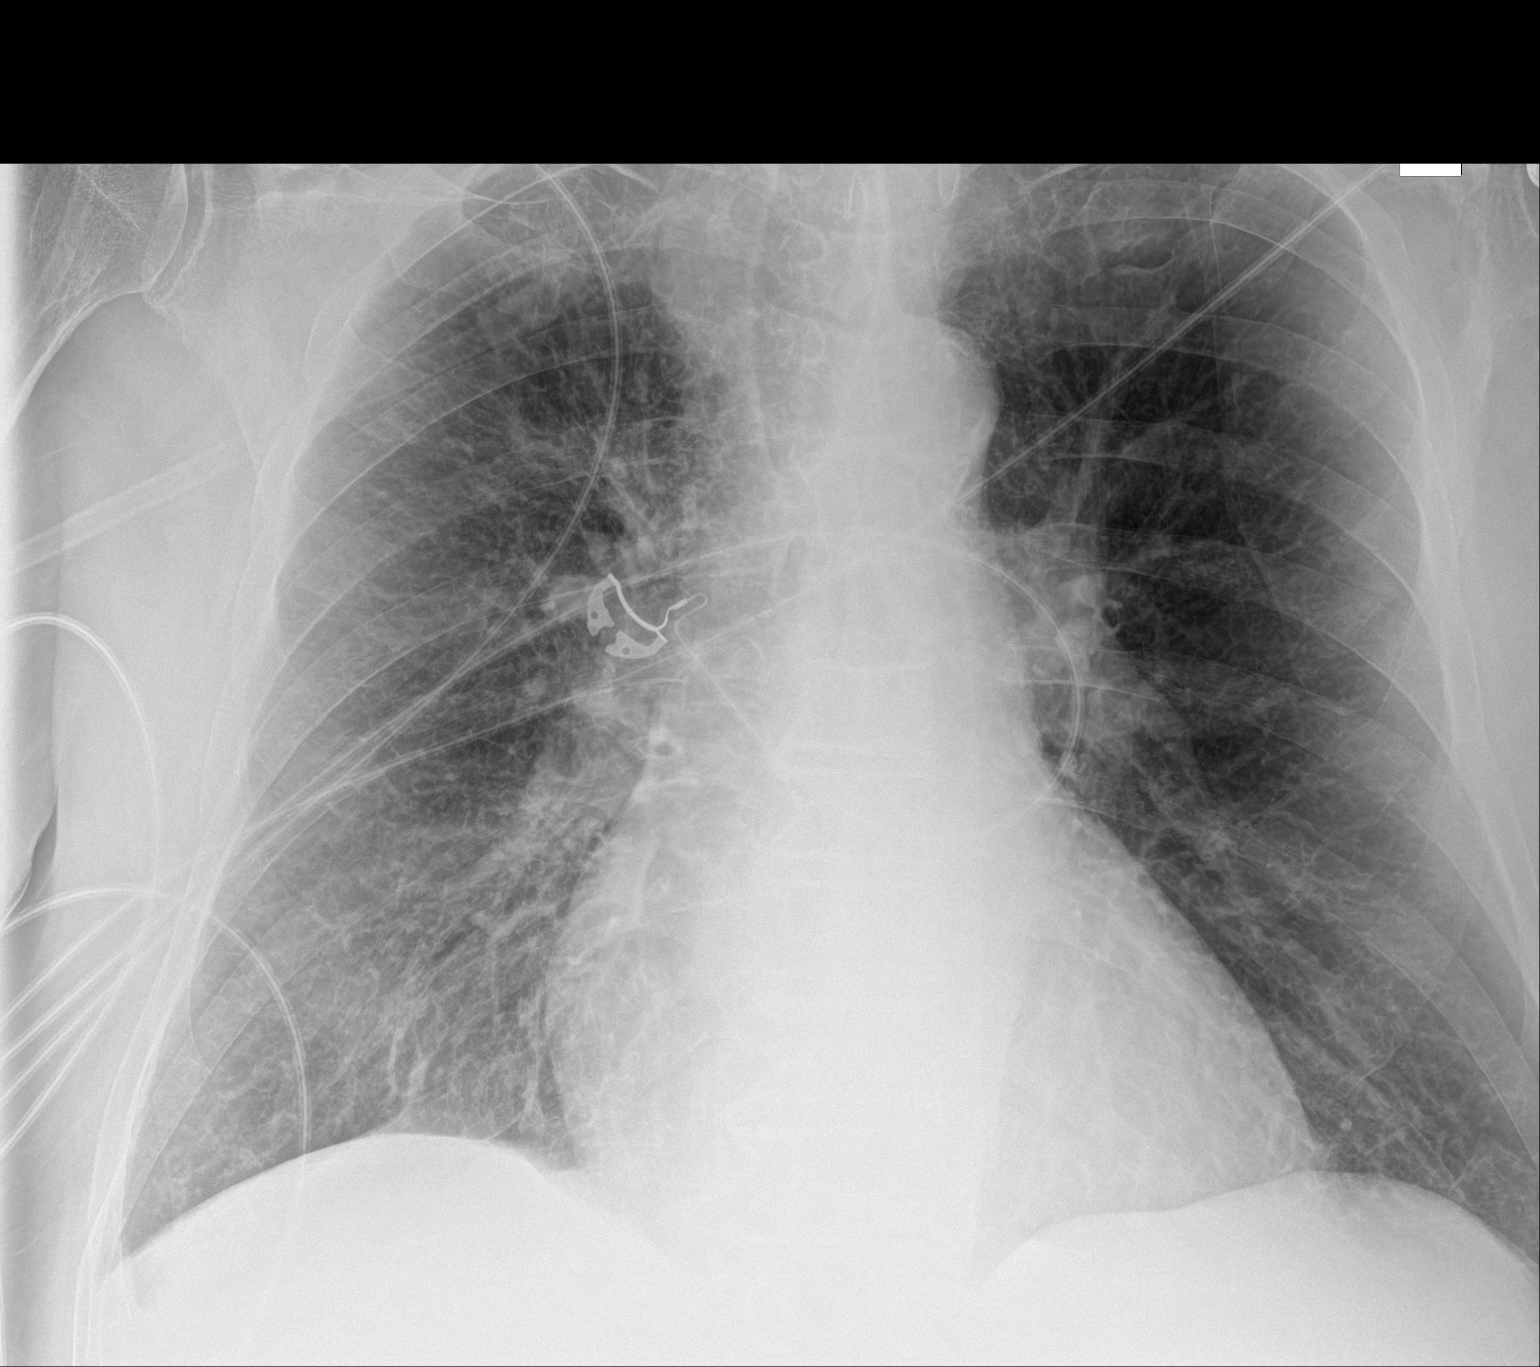

[1 of 1 positions shown; findings below may reference images not displayed]

FINDINGS: Cardiomegaly. Unchanged diffuse bilateral interstitial pulmonary
opacity. The visualized skeletal structures are unremarkable.
IMPRESSION: Cardiomegaly with unchanged diffuse bilateral interstitial pulmonary
opacity, likely edema. No new or focal airspace opacity.

## 2023-11-05 IMAGING — DX DG CHEST 1V PORT
1 series · 1 of 1 positions shown · non-contrast
Comparison: 05/28/2021, 05/06/2021, 07/29/2019

CLINICAL DATA: Acute respiratory distress

EXAM:
PORTABLE CHEST 1 VIEW

[chest ap]
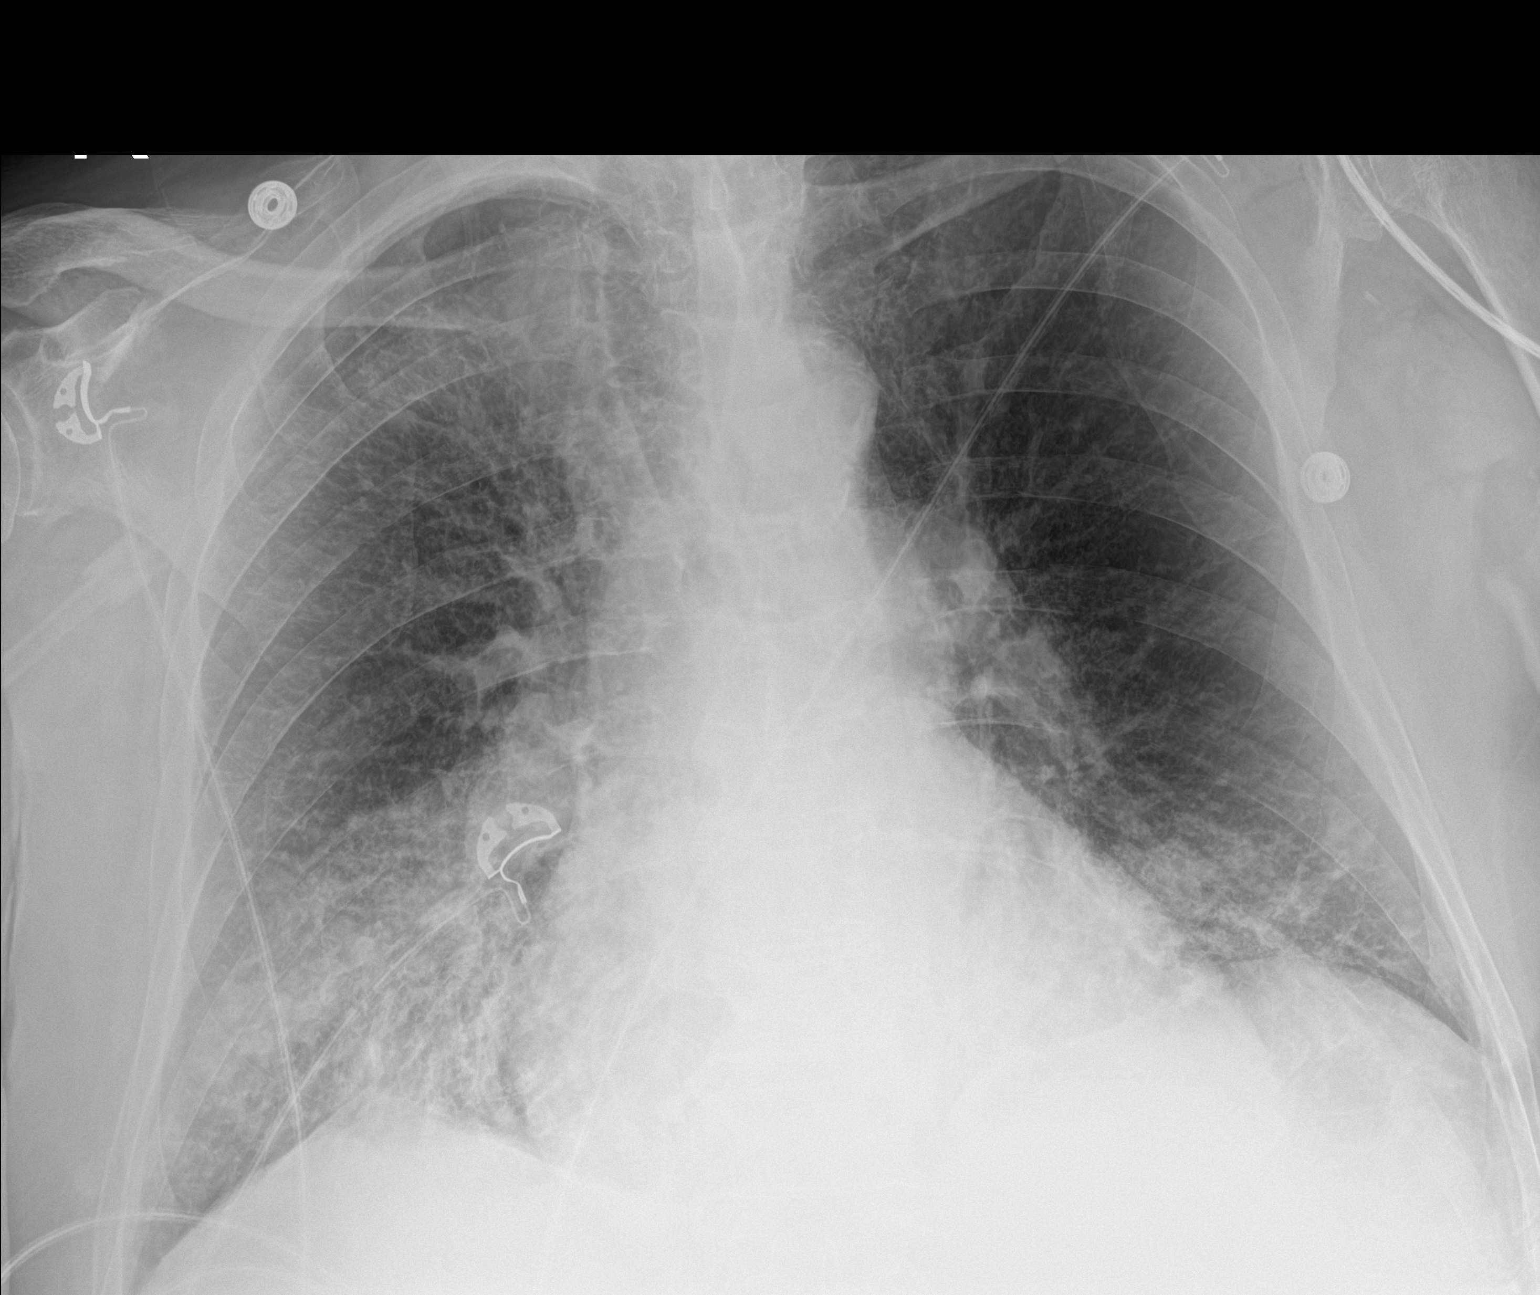

[1 of 1 positions shown; findings below may reference images not displayed]

FINDINGS: Mild cardiomegaly with slight central congestion and mild diffuse
interstitial edema. Interval development of more focal right greater
than left lower lung airspace disease suspicious for pneumonia. No
sizable effusion. Aortic atherosclerosis. No pneumothorax.
IMPRESSION: 1. Mild cardiomegaly with slight central congestion and mild
interstitial edema
2. Interval development of more confluent airspace disease in the
right greater than left lower lung suspicious for pneumonia

## 2023-11-06 IMAGING — CT CT CHEST W/O CM
2 of 4 series · 15 of 36 positions shown, 18 images · non-contrast
Comparison: May 06, 2021.

CLINICAL DATA: Pneumonia.



[Series 4: thorax 2.0 · axial · 0.88mm/px · z∈[-1064,-768]mm · 12 of 166 slices shown, 15 images]
[im 9/166  mediastinal]
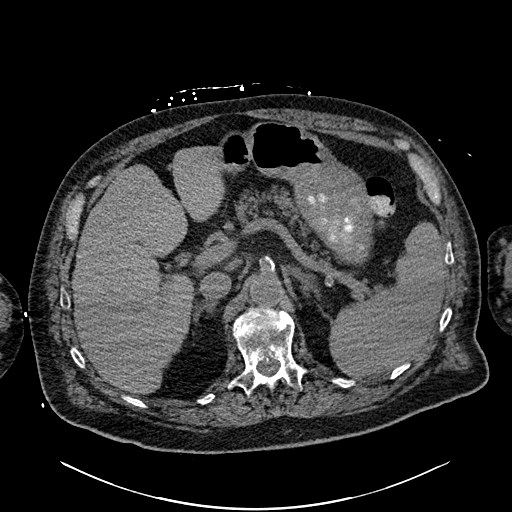
[im 9/166  lung]
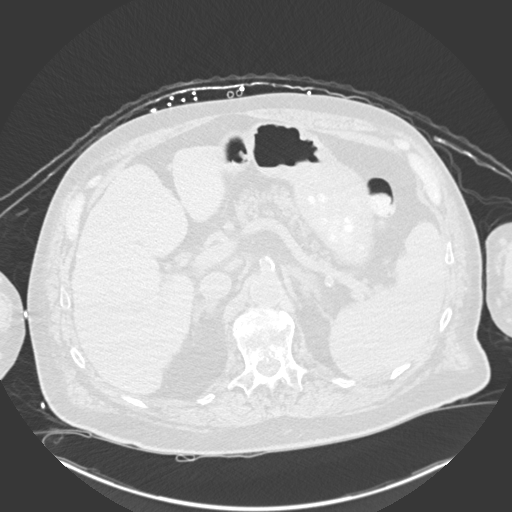
[im 27/166  lung]
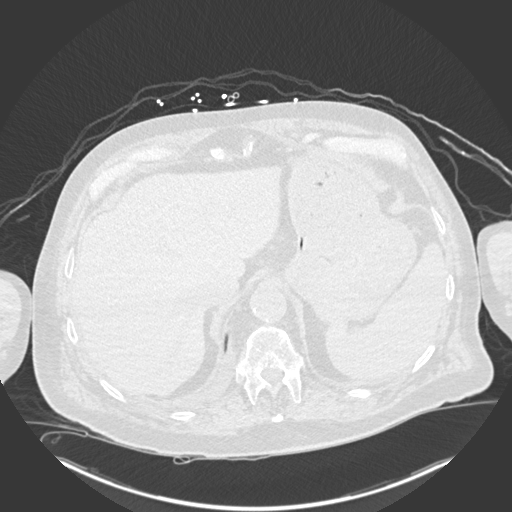
[im 35/166  lung]
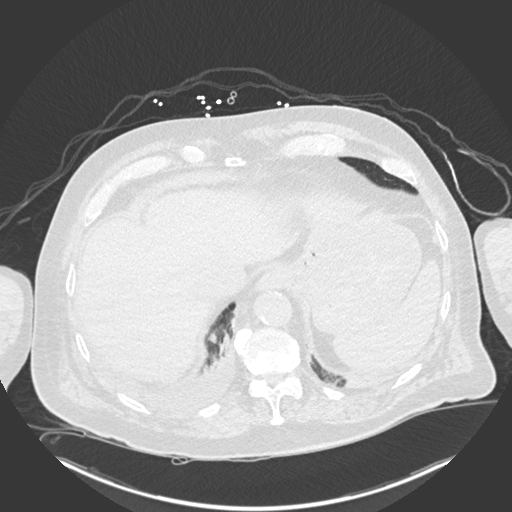
[im 53/166  lung]
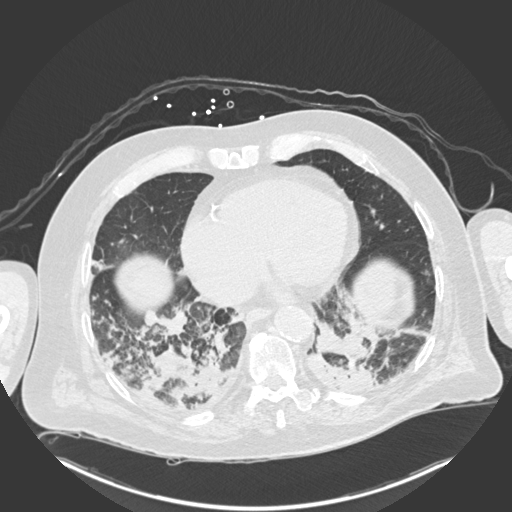
[im 61/166  mediastinal]
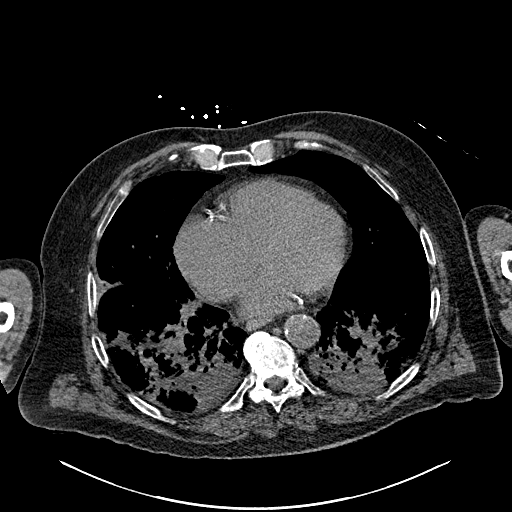
[im 61/166  lung]
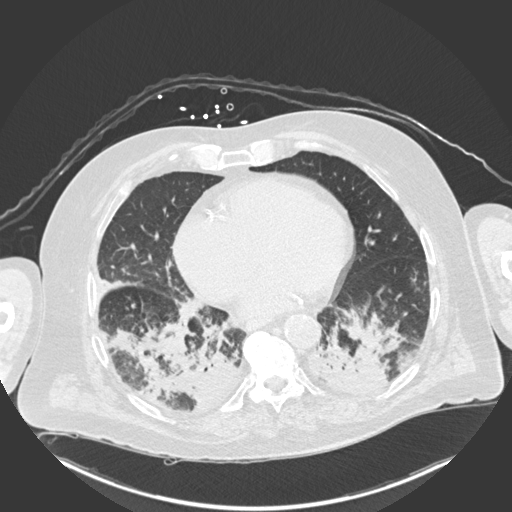
[im 79/166  lung]
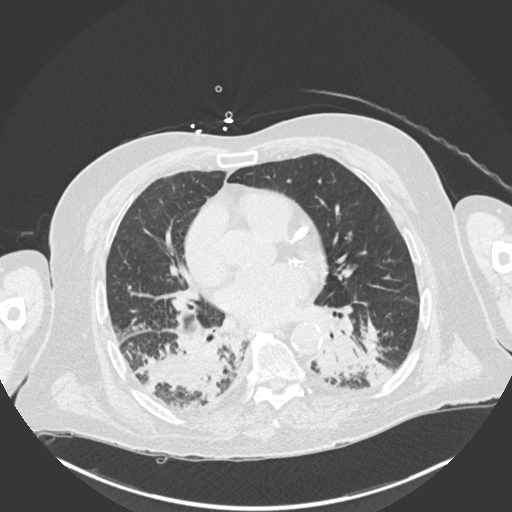
[im 87/166  lung]
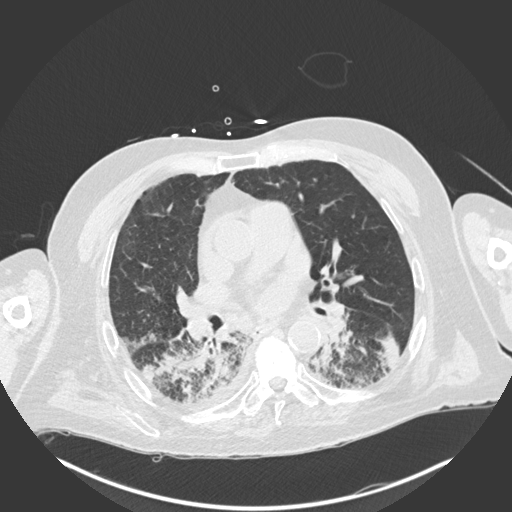
[im 105/166  lung]
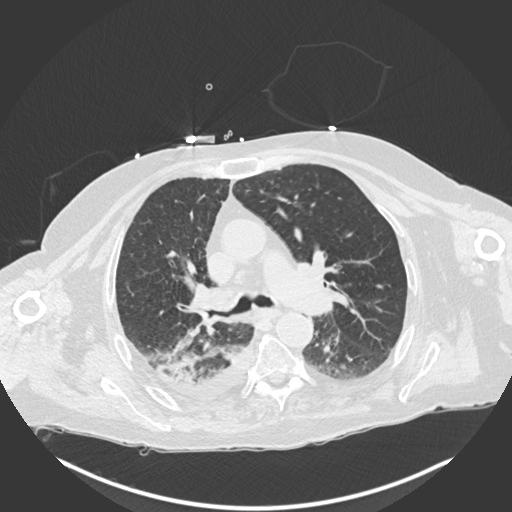
[im 113/166  mediastinal]
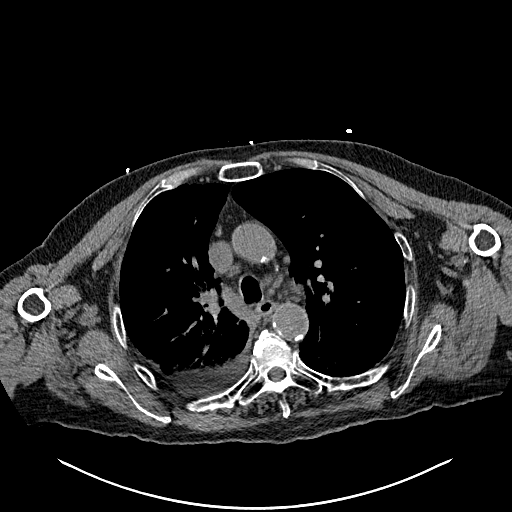
[im 113/166  lung]
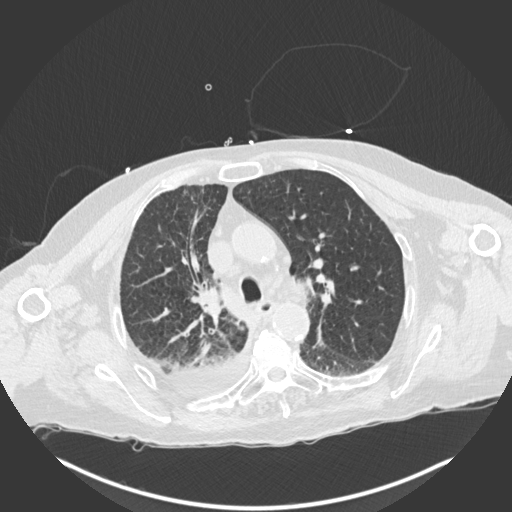
[im 131/166  lung]
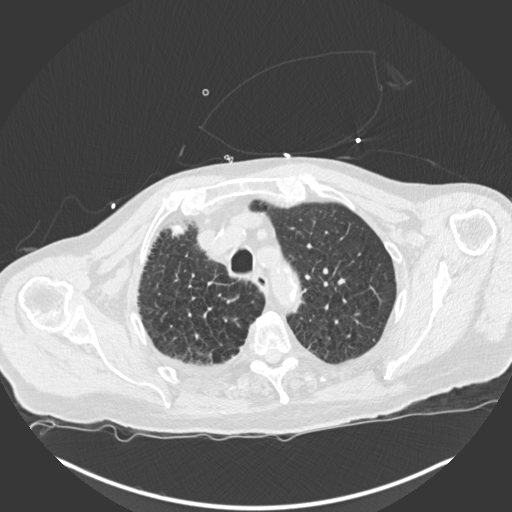
[im 139/166  lung]
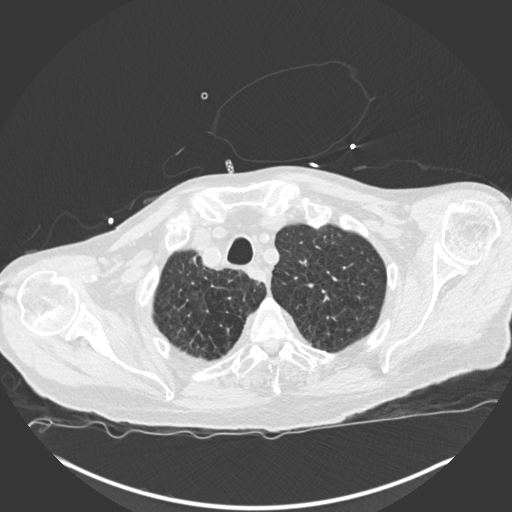
[im 157/166  lung]
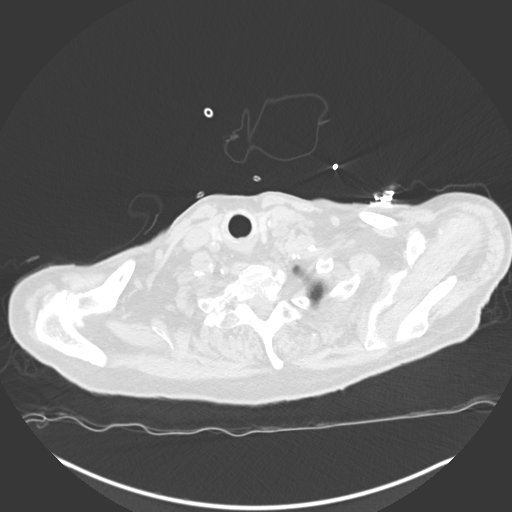

[Series 7: coronal · coronal · 0.65mm/px · 3 of 114 slices shown]
[im 23/114  lung]
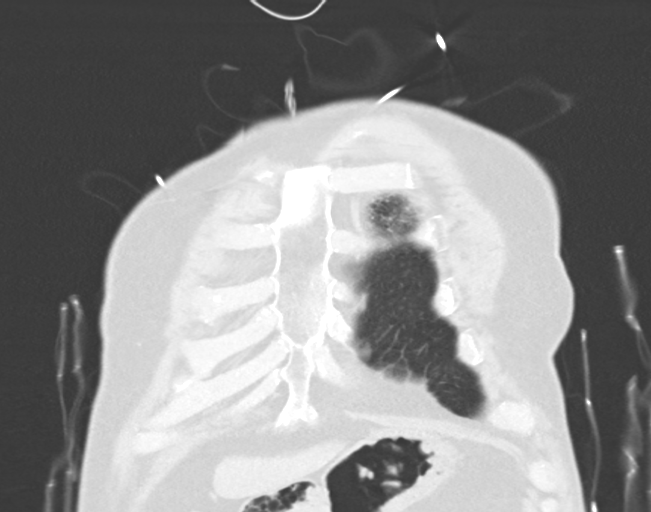
[im 46/114  lung]
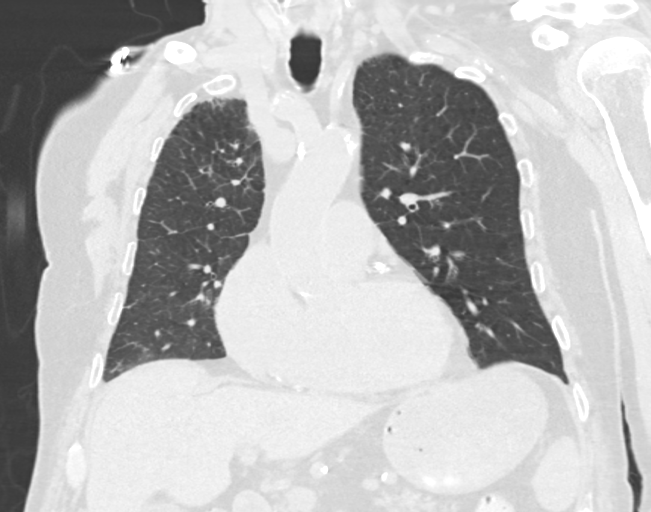
[im 68/114  lung]
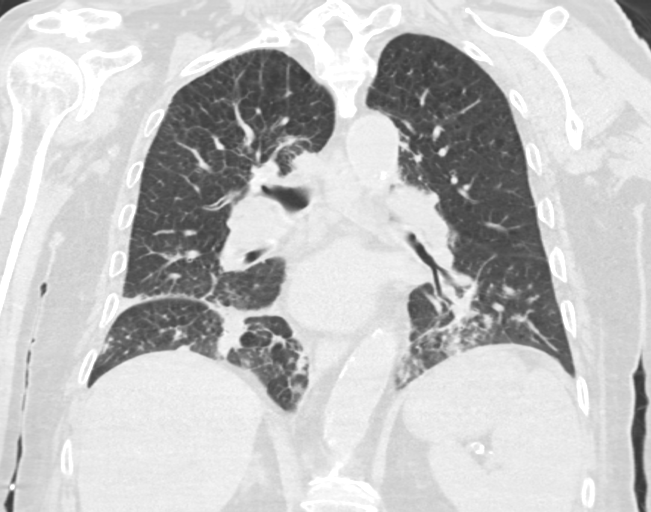

[15 of 36 positions shown; findings below may reference images not displayed]

FINDINGS: Cardiovascular: Atherosclerosis of thoracic aorta is noted without
aneurysm formation. Normal cardiac size. No pericardial effusion.
Coronary artery calcifications are noted.

Mediastinum/Nodes: No enlarged mediastinal or axillary lymph nodes.
Thyroid gland, trachea, and esophagus demonstrate no significant
findings.

Lungs/Pleura: No pneumothorax is noted. Minimal right pleural
effusion is noted. Extensive patchy airspace opacities are seen
throughout both lower lobes most consistent with pneumonia. Some
emphysematous changes noted as well.

Upper Abdomen: No acute abnormality.

Musculoskeletal: No chest wall mass or suspicious bone lesions
identified.
IMPRESSION: Interval development of extensive patchy airspace opacities
throughout both lower lobes most consistent with pneumonia. Minimal
right pleural effusion is noted.

Coronary artery calcifications are noted suggesting coronary artery
disease.

Aortic Atherosclerosis (0B1OA-XIQ.Q) and Emphysema (0B1OA-RI0.Y).
